# Patient Record
Sex: Female | Born: 1935 | Race: Black or African American | Hispanic: No | Smoking: Former smoker
Health system: Southern US, Community
[De-identification: ages and names within clinical notes are randomized; demographics above are authoritative.]

## PROBLEM LIST (undated history)

## (undated) DIAGNOSIS — I2510 Atherosclerotic heart disease of native coronary artery without angina pectoris: Secondary | ICD-10-CM

## (undated) DIAGNOSIS — H9190 Unspecified hearing loss, unspecified ear: Secondary | ICD-10-CM

## (undated) DIAGNOSIS — N189 Chronic kidney disease, unspecified: Secondary | ICD-10-CM

## (undated) DIAGNOSIS — M1990 Unspecified osteoarthritis, unspecified site: Secondary | ICD-10-CM

## (undated) DIAGNOSIS — D649 Anemia, unspecified: Secondary | ICD-10-CM

## (undated) DIAGNOSIS — F419 Anxiety disorder, unspecified: Secondary | ICD-10-CM

## (undated) DIAGNOSIS — C679 Malignant neoplasm of bladder, unspecified: Secondary | ICD-10-CM

## (undated) DIAGNOSIS — I770 Arteriovenous fistula, acquired: Secondary | ICD-10-CM

## (undated) DIAGNOSIS — Z8669 Personal history of other diseases of the nervous system and sense organs: Secondary | ICD-10-CM

## (undated) DIAGNOSIS — K08109 Complete loss of teeth, unspecified cause, unspecified class: Secondary | ICD-10-CM

## (undated) DIAGNOSIS — Z973 Presence of spectacles and contact lenses: Secondary | ICD-10-CM

## (undated) DIAGNOSIS — Z972 Presence of dental prosthetic device (complete) (partial): Secondary | ICD-10-CM

## (undated) DIAGNOSIS — I255 Ischemic cardiomyopathy: Secondary | ICD-10-CM

## (undated) DIAGNOSIS — I739 Peripheral vascular disease, unspecified: Secondary | ICD-10-CM

## (undated) DIAGNOSIS — E785 Hyperlipidemia, unspecified: Secondary | ICD-10-CM

## (undated) DIAGNOSIS — Z5112 Encounter for antineoplastic immunotherapy: Secondary | ICD-10-CM

## (undated) DIAGNOSIS — E119 Type 2 diabetes mellitus without complications: Secondary | ICD-10-CM

## (undated) DIAGNOSIS — J449 Chronic obstructive pulmonary disease, unspecified: Secondary | ICD-10-CM

## (undated) DIAGNOSIS — I350 Nonrheumatic aortic (valve) stenosis: Secondary | ICD-10-CM

## (undated) DIAGNOSIS — E039 Hypothyroidism, unspecified: Secondary | ICD-10-CM

## (undated) DIAGNOSIS — I10 Essential (primary) hypertension: Secondary | ICD-10-CM

## (undated) DIAGNOSIS — L659 Nonscarring hair loss, unspecified: Secondary | ICD-10-CM

## (undated) DIAGNOSIS — I219 Acute myocardial infarction, unspecified: Secondary | ICD-10-CM

## (undated) DIAGNOSIS — I639 Cerebral infarction, unspecified: Secondary | ICD-10-CM

## (undated) DIAGNOSIS — IMO0001 Reserved for inherently not codable concepts without codable children: Secondary | ICD-10-CM

## (undated) HISTORY — DX: Personal history of other diseases of the nervous system and sense organs: Z86.69

## (undated) HISTORY — PX: CYSTOSTOMY W/ BLADDER BIOPSY: SHX1431

## (undated) HISTORY — DX: Atherosclerotic heart disease of native coronary artery without angina pectoris: I25.10

## (undated) HISTORY — DX: Malignant neoplasm of bladder, unspecified: C67.9

## (undated) HISTORY — DX: Chronic kidney disease, unspecified: N18.9

## (undated) HISTORY — DX: Hypothyroidism, unspecified: E03.9

## (undated) HISTORY — DX: Peripheral vascular disease, unspecified: I73.9

## (undated) HISTORY — DX: Cerebral infarction, unspecified: I63.9

## (undated) HISTORY — PX: EYE SURGERY: SHX253

## (undated) HISTORY — DX: Type 2 diabetes mellitus without complications: E11.9

## (undated) HISTORY — DX: Chronic obstructive pulmonary disease, unspecified: J44.9

## (undated) HISTORY — DX: Nonrheumatic aortic (valve) stenosis: I35.0

## (undated) HISTORY — DX: Essential (primary) hypertension: I10

## (undated) HISTORY — PX: ANGIOPLASTY: SHX39

## (undated) HISTORY — DX: Hyperlipidemia, unspecified: E78.5

## (undated) HISTORY — DX: Encounter for antineoplastic immunotherapy: Z51.12

---

## 1997-12-04 ENCOUNTER — Ambulatory Visit: Admission: RE | Admit: 1997-12-04 | Discharge: 1997-12-04 | Payer: Self-pay | Admitting: Cardiovascular Disease

## 1998-10-27 ENCOUNTER — Emergency Department (HOSPITAL_COMMUNITY): Admission: EM | Admit: 1998-10-27 | Discharge: 1998-10-27 | Payer: Self-pay | Admitting: *Deleted

## 1998-12-17 ENCOUNTER — Encounter: Payer: Self-pay | Admitting: Internal Medicine

## 1998-12-17 ENCOUNTER — Ambulatory Visit (HOSPITAL_COMMUNITY): Admission: RE | Admit: 1998-12-17 | Discharge: 1998-12-17 | Payer: Self-pay | Admitting: Internal Medicine

## 1999-03-28 ENCOUNTER — Emergency Department (HOSPITAL_COMMUNITY): Admission: EM | Admit: 1999-03-28 | Discharge: 1999-03-28 | Payer: Self-pay | Admitting: Emergency Medicine

## 1999-03-28 ENCOUNTER — Encounter: Payer: Self-pay | Admitting: Emergency Medicine

## 1999-06-24 ENCOUNTER — Encounter: Payer: Self-pay | Admitting: Internal Medicine

## 1999-06-24 ENCOUNTER — Inpatient Hospital Stay (HOSPITAL_COMMUNITY): Admission: AD | Admit: 1999-06-24 | Discharge: 1999-07-21 | Payer: Self-pay | Admitting: Internal Medicine

## 1999-06-25 ENCOUNTER — Encounter: Payer: Self-pay | Admitting: Internal Medicine

## 1999-06-27 ENCOUNTER — Encounter: Payer: Self-pay | Admitting: Internal Medicine

## 1999-06-29 ENCOUNTER — Encounter: Payer: Self-pay | Admitting: Internal Medicine

## 1999-07-05 ENCOUNTER — Encounter: Payer: Self-pay | Admitting: Internal Medicine

## 1999-07-12 ENCOUNTER — Encounter: Payer: Self-pay | Admitting: Internal Medicine

## 2001-01-20 ENCOUNTER — Ambulatory Visit (HOSPITAL_COMMUNITY): Admission: RE | Admit: 2001-01-20 | Discharge: 2001-01-20 | Payer: Self-pay | Admitting: Nephrology

## 2001-02-10 ENCOUNTER — Ambulatory Visit (HOSPITAL_COMMUNITY): Admission: RE | Admit: 2001-02-10 | Discharge: 2001-02-10 | Payer: Self-pay | Admitting: Nephrology

## 2001-02-25 ENCOUNTER — Ambulatory Visit (HOSPITAL_COMMUNITY): Admission: RE | Admit: 2001-02-25 | Discharge: 2001-02-25 | Payer: Self-pay | Admitting: Nephrology

## 2001-03-13 ENCOUNTER — Emergency Department (HOSPITAL_COMMUNITY): Admission: EM | Admit: 2001-03-13 | Discharge: 2001-03-13 | Payer: Self-pay

## 2001-03-18 ENCOUNTER — Inpatient Hospital Stay (HOSPITAL_COMMUNITY): Admission: EM | Admit: 2001-03-18 | Discharge: 2001-03-24 | Payer: Self-pay | Admitting: *Deleted

## 2001-03-19 ENCOUNTER — Encounter: Payer: Self-pay | Admitting: Internal Medicine

## 2001-03-21 ENCOUNTER — Encounter: Payer: Self-pay | Admitting: Internal Medicine

## 2001-04-07 ENCOUNTER — Inpatient Hospital Stay (HOSPITAL_COMMUNITY): Admission: EM | Admit: 2001-04-07 | Discharge: 2001-04-10 | Payer: Self-pay | Admitting: Emergency Medicine

## 2001-04-07 ENCOUNTER — Encounter: Payer: Self-pay | Admitting: Nephrology

## 2001-04-30 ENCOUNTER — Encounter (HOSPITAL_COMMUNITY): Admission: RE | Admit: 2001-04-30 | Discharge: 2001-07-29 | Payer: Self-pay | Admitting: Nephrology

## 2001-05-12 ENCOUNTER — Encounter: Admission: RE | Admit: 2001-05-12 | Discharge: 2001-05-12 | Payer: Self-pay | Admitting: Urology

## 2001-05-12 ENCOUNTER — Encounter: Payer: Self-pay | Admitting: Urology

## 2001-05-19 ENCOUNTER — Ambulatory Visit (HOSPITAL_COMMUNITY): Admission: RE | Admit: 2001-05-19 | Discharge: 2001-05-19 | Payer: Self-pay | Admitting: Urology

## 2001-05-19 ENCOUNTER — Encounter (INDEPENDENT_AMBULATORY_CARE_PROVIDER_SITE_OTHER): Payer: Self-pay | Admitting: Specialist

## 2001-07-30 ENCOUNTER — Encounter (HOSPITAL_COMMUNITY): Admission: RE | Admit: 2001-07-30 | Discharge: 2001-10-28 | Payer: Self-pay | Admitting: Nephrology

## 2001-08-31 ENCOUNTER — Encounter (INDEPENDENT_AMBULATORY_CARE_PROVIDER_SITE_OTHER): Payer: Self-pay

## 2001-08-31 ENCOUNTER — Ambulatory Visit (HOSPITAL_BASED_OUTPATIENT_CLINIC_OR_DEPARTMENT_OTHER): Admission: RE | Admit: 2001-08-31 | Discharge: 2001-08-31 | Payer: Self-pay | Admitting: Urology

## 2001-11-09 ENCOUNTER — Encounter (HOSPITAL_COMMUNITY): Admission: RE | Admit: 2001-11-09 | Discharge: 2002-02-07 | Payer: Self-pay | Admitting: Nephrology

## 2001-11-30 ENCOUNTER — Ambulatory Visit (HOSPITAL_BASED_OUTPATIENT_CLINIC_OR_DEPARTMENT_OTHER): Admission: RE | Admit: 2001-11-30 | Discharge: 2001-11-30 | Payer: Self-pay | Admitting: Urology

## 2001-11-30 ENCOUNTER — Encounter (INDEPENDENT_AMBULATORY_CARE_PROVIDER_SITE_OTHER): Payer: Self-pay

## 2001-11-30 ENCOUNTER — Encounter (INDEPENDENT_AMBULATORY_CARE_PROVIDER_SITE_OTHER): Payer: Self-pay | Admitting: Specialist

## 2002-02-15 ENCOUNTER — Encounter (HOSPITAL_COMMUNITY): Admission: RE | Admit: 2002-02-15 | Discharge: 2002-05-16 | Payer: Self-pay | Admitting: Nephrology

## 2002-03-01 ENCOUNTER — Ambulatory Visit (HOSPITAL_BASED_OUTPATIENT_CLINIC_OR_DEPARTMENT_OTHER): Admission: RE | Admit: 2002-03-01 | Discharge: 2002-03-01 | Payer: Self-pay | Admitting: Urology

## 2002-03-01 ENCOUNTER — Encounter (INDEPENDENT_AMBULATORY_CARE_PROVIDER_SITE_OTHER): Payer: Self-pay

## 2002-05-24 ENCOUNTER — Encounter (HOSPITAL_COMMUNITY): Admission: RE | Admit: 2002-05-24 | Discharge: 2002-08-22 | Payer: Self-pay | Admitting: Nephrology

## 2002-05-26 ENCOUNTER — Encounter: Payer: Self-pay | Admitting: Urology

## 2002-05-26 ENCOUNTER — Encounter: Admission: RE | Admit: 2002-05-26 | Discharge: 2002-05-26 | Payer: Self-pay | Admitting: Urology

## 2002-05-31 ENCOUNTER — Ambulatory Visit (HOSPITAL_BASED_OUTPATIENT_CLINIC_OR_DEPARTMENT_OTHER): Admission: RE | Admit: 2002-05-31 | Discharge: 2002-05-31 | Payer: Self-pay | Admitting: Urology

## 2002-05-31 ENCOUNTER — Encounter (INDEPENDENT_AMBULATORY_CARE_PROVIDER_SITE_OTHER): Payer: Self-pay

## 2002-06-03 ENCOUNTER — Encounter: Admission: RE | Admit: 2002-06-03 | Discharge: 2002-06-03 | Payer: Self-pay | Admitting: Urology

## 2002-06-03 ENCOUNTER — Encounter: Payer: Self-pay | Admitting: Urology

## 2002-08-30 ENCOUNTER — Encounter (HOSPITAL_COMMUNITY): Admission: RE | Admit: 2002-08-30 | Discharge: 2002-11-28 | Payer: Self-pay | Admitting: Nephrology

## 2002-12-06 ENCOUNTER — Encounter (HOSPITAL_COMMUNITY): Admission: RE | Admit: 2002-12-06 | Discharge: 2003-03-06 | Payer: Self-pay | Admitting: Nephrology

## 2003-07-04 ENCOUNTER — Emergency Department (HOSPITAL_COMMUNITY): Admission: EM | Admit: 2003-07-04 | Discharge: 2003-07-04 | Payer: Self-pay | Admitting: Emergency Medicine

## 2003-12-06 ENCOUNTER — Ambulatory Visit (HOSPITAL_COMMUNITY): Admission: RE | Admit: 2003-12-06 | Discharge: 2003-12-06 | Payer: Self-pay | Admitting: Vascular Surgery

## 2004-05-31 ENCOUNTER — Ambulatory Visit: Payer: Self-pay

## 2008-07-11 ENCOUNTER — Ambulatory Visit: Payer: Self-pay | Admitting: Cardiology

## 2008-07-11 ENCOUNTER — Encounter: Payer: Self-pay | Admitting: Cardiology

## 2008-07-11 DIAGNOSIS — R0989 Other specified symptoms and signs involving the circulatory and respiratory systems: Secondary | ICD-10-CM

## 2008-07-11 DIAGNOSIS — E785 Hyperlipidemia, unspecified: Secondary | ICD-10-CM

## 2008-07-11 DIAGNOSIS — R011 Cardiac murmur, unspecified: Secondary | ICD-10-CM

## 2008-07-11 DIAGNOSIS — I498 Other specified cardiac arrhythmias: Secondary | ICD-10-CM

## 2008-07-11 DIAGNOSIS — I2510 Atherosclerotic heart disease of native coronary artery without angina pectoris: Secondary | ICD-10-CM | POA: Insufficient documentation

## 2008-07-11 DIAGNOSIS — I10 Essential (primary) hypertension: Secondary | ICD-10-CM | POA: Insufficient documentation

## 2008-07-11 DIAGNOSIS — I739 Peripheral vascular disease, unspecified: Secondary | ICD-10-CM

## 2008-07-20 ENCOUNTER — Encounter: Payer: Self-pay | Admitting: Cardiology

## 2008-07-20 ENCOUNTER — Ambulatory Visit: Payer: Self-pay | Admitting: Cardiology

## 2008-07-20 ENCOUNTER — Ambulatory Visit: Payer: Self-pay

## 2008-08-14 ENCOUNTER — Encounter: Payer: Self-pay | Admitting: Cardiology

## 2008-08-14 ENCOUNTER — Ambulatory Visit: Payer: Self-pay | Admitting: Cardiology

## 2008-08-14 DIAGNOSIS — I2589 Other forms of chronic ischemic heart disease: Secondary | ICD-10-CM | POA: Insufficient documentation

## 2008-08-23 ENCOUNTER — Telehealth (INDEPENDENT_AMBULATORY_CARE_PROVIDER_SITE_OTHER): Payer: Self-pay | Admitting: *Deleted

## 2009-02-12 ENCOUNTER — Ambulatory Visit: Payer: Self-pay | Admitting: Cardiology

## 2009-02-12 DIAGNOSIS — I359 Nonrheumatic aortic valve disorder, unspecified: Secondary | ICD-10-CM

## 2009-02-13 ENCOUNTER — Encounter: Payer: Self-pay | Admitting: Cardiology

## 2009-02-26 ENCOUNTER — Encounter: Payer: Self-pay | Admitting: Cardiology

## 2009-06-19 ENCOUNTER — Encounter: Payer: Self-pay | Admitting: Cardiology

## 2009-06-27 ENCOUNTER — Encounter: Payer: Self-pay | Admitting: Cardiology

## 2009-08-06 ENCOUNTER — Encounter: Payer: Self-pay | Admitting: Cardiology

## 2009-08-07 ENCOUNTER — Ambulatory Visit: Payer: Self-pay

## 2009-08-07 ENCOUNTER — Ambulatory Visit (HOSPITAL_COMMUNITY): Admission: RE | Admit: 2009-08-07 | Discharge: 2009-08-07 | Payer: Self-pay | Admitting: Cardiology

## 2009-08-07 ENCOUNTER — Encounter: Payer: Self-pay | Admitting: Cardiology

## 2009-08-07 ENCOUNTER — Ambulatory Visit: Payer: Self-pay | Admitting: Cardiology

## 2009-10-29 ENCOUNTER — Encounter: Payer: Self-pay | Admitting: Cardiology

## 2010-02-14 ENCOUNTER — Ambulatory Visit: Payer: Self-pay | Admitting: Cardiology

## 2010-02-20 ENCOUNTER — Encounter: Payer: Self-pay | Admitting: Cardiology

## 2010-05-10 ENCOUNTER — Emergency Department (HOSPITAL_COMMUNITY)
Admission: EM | Admit: 2010-05-10 | Discharge: 2010-05-11 | Payer: Self-pay | Source: Home / Self Care | Admitting: Emergency Medicine

## 2010-05-20 LAB — URINALYSIS, ROUTINE W REFLEX MICROSCOPIC
Bilirubin Urine: NEGATIVE
Hgb urine dipstick: NEGATIVE
Ketones, ur: NEGATIVE mg/dL
Nitrite: NEGATIVE
Protein, ur: NEGATIVE mg/dL
Specific Gravity, Urine: 1.011 (ref 1.005–1.030)
Urine Glucose, Fasting: NEGATIVE mg/dL
Urobilinogen, UA: 0.2 mg/dL (ref 0.0–1.0)
pH: 5.5 (ref 5.0–8.0)

## 2010-05-20 LAB — POCT CARDIAC MARKERS
CKMB, poc: 1.3 ng/mL (ref 1.0–8.0)
Myoglobin, poc: 109 ng/mL (ref 12–200)
Troponin i, poc: 0.07 ng/mL (ref 0.00–0.09)

## 2010-05-20 LAB — POCT I-STAT, CHEM 8
BUN: 49 mg/dL — ABNORMAL HIGH (ref 6–23)
Calcium, Ion: 1.13 mmol/L (ref 1.12–1.32)
Chloride: 112 mEq/L (ref 96–112)
Creatinine, Ser: 2.4 mg/dL — ABNORMAL HIGH (ref 0.4–1.2)
Glucose, Bld: 145 mg/dL — ABNORMAL HIGH (ref 70–99)
HCT: 36 % (ref 36.0–46.0)
Hemoglobin: 12.2 g/dL (ref 12.0–15.0)
Potassium: 4.2 mEq/L (ref 3.5–5.1)
Sodium: 142 mEq/L (ref 135–145)
TCO2: 24 mmol/L (ref 0–100)

## 2010-05-20 LAB — GLUCOSE, CAPILLARY: Glucose-Capillary: 155 mg/dL — ABNORMAL HIGH (ref 70–99)

## 2010-05-20 LAB — CBC
HCT: 35.5 % — ABNORMAL LOW (ref 36.0–46.0)
Hemoglobin: 11.5 g/dL — ABNORMAL LOW (ref 12.0–15.0)
MCH: 29.6 pg (ref 26.0–34.0)
MCHC: 32.4 g/dL (ref 30.0–36.0)
MCV: 91.5 fL (ref 78.0–100.0)
Platelets: 153 10*3/uL (ref 150–400)
RBC: 3.88 MIL/uL (ref 3.87–5.11)
RDW: 17.1 % — ABNORMAL HIGH (ref 11.5–15.5)
WBC: 6.1 10*3/uL (ref 4.0–10.5)

## 2010-05-20 LAB — DIFFERENTIAL
Basophils Absolute: 0 10*3/uL (ref 0.0–0.1)
Basophils Relative: 0 % (ref 0–1)
Eosinophils Absolute: 0.2 10*3/uL (ref 0.0–0.7)
Eosinophils Relative: 4 % (ref 0–5)
Lymphocytes Relative: 38 % (ref 12–46)
Lymphs Abs: 2.3 10*3/uL (ref 0.7–4.0)
Monocytes Absolute: 0.5 10*3/uL (ref 0.1–1.0)
Monocytes Relative: 8 % (ref 3–12)
Neutro Abs: 3.1 10*3/uL (ref 1.7–7.7)
Neutrophils Relative %: 50 % (ref 43–77)

## 2010-05-20 LAB — SEDIMENTATION RATE: Sed Rate: 18 mm/hr (ref 0–22)

## 2010-06-06 NOTE — Letter (Signed)
Summary: [facility] Patient Note   [facility] Patient Note   Imported By: Roderic Ovens 11/27/2009 16:08:15  _____________________________________________________________________  External Attachment:    Type:   Image     Comment:   External Document

## 2010-06-06 NOTE — Letter (Signed)
Summary: [facility] Office Note  [facility] Office Note   Imported By: Roderic Ovens 08/01/2009 16:27:38  _____________________________________________________________________  External Attachment:    Type:   Image     Comment:   External Document

## 2010-06-06 NOTE — Assessment & Plan Note (Signed)
Summary: f40m  Medications Added COREG 12.5 MG TABS (CARVEDILOL) once daily BISOPROLOL FUMARATE 5 MG TABS (BISOPROLOL FUMARATE) one-half  tablet daily ASPIRIN 81 MG TBEC (ASPIRIN) one daily HYDRALAZINE HCL 25 MG TABS (HYDRALAZINE HCL) take one  tablet three times a day      Allergies Added:   Visit Type:  6 month follow up Primary Dawn Kiper:  Jarome Matin  CC:  pt stated has some SOB and .  History of Present Illness: 77 yo with PAD, CKD, CAD, mild ischemic CMP presents for followup to cardiology clinic.  She has been stable since I last saw her.  She did not tolerate increasing Coreg.  She feels like taking 12.5 mg two times a day made her short of breath.  She has no problem taking Coreg 12.5 mg once daily and has been taking it this way. No significant exertional dyspnea.  She can climb a flight of steps without problems.  Her calf claudication has improved.  She is able to walk about 100 yards before her calves tighten.  She is still smoking a pack every 3 days.  No chest pain.  Carotid dopplers in 4/11 showed moderate stenosis.    ECG:  NSR with PVCs, LAFB, RBBB, old inferior MI (similar to prior ECG)  Labs (1/10): LDL 66, HDL 61, creatinine 2.32 Labs (10/10): creatinine 2.1  Current Medications (verified): 1)  Coreg 12.5 Mg Tabs (Carvedilol) .... Once Daily 2)  Furosemide 40 Mg Tabs (Furosemide) .... Take One Tablet By Mouth Daily. 3)  Hectorol 2.5 Mcg Caps (Doxercalciferol) .... Take 1 By Mouth  M W F 4)  Isosorbide Mononitrate Cr 60 Mg  Tb24 (Isosorbide Mononitrate) .... Take 1 By Mouth Once Daily 5)  Levothyroxine Sodium 25 Mcg  Tabs (Levothyroxine Sodium) .Marland Kitchen.. 1 By Mouth Daily 6)  Lipitor 80 Mg Tabs (Atorvastatin Calcium) .... Take One Tablet By Mouth At Bedtime 7)  Plavix 75 Mg Tabs (Clopidogrel Bisulfate) .... Take One Tablet By Mouth Daily 8)  Azopt 1 % Susp (Brinzolamide) .... Two Times A Day 9)  Novolog 100 Unit/ml Soln (Insulin Aspart) .... As Directed Three Times  A Day 10)  Levemir Flexpen 100 Unit/ml Soln (Insulin Detemir) .... Take As Directed At Bedtime 11)  Aspirin Ec 325 Mg Tbec (Aspirin) .... Take One Tablet By Mouth Daily 12)  Colchicine 0.6 Mg Tabs (Colchicine) .... As Needed 13)  Nitroglycerin 0.4 Mg Subl (Nitroglycerin) .... One Tablet Under Tongue Every 5 Minutes As Needed For Chest Pain---May Repeat Times Three 14)  Senokot 8.6 Mg Tabs (Sennosides) .... As Needed 15)  Folic Acid   (Folic Acid) .Marland Kitchen.. 1 Tab By Mouth Once Daily 16)  Hydralazine Hcl 25 Mg Tabs (Hydralazine Hcl) .... Take One  Tablet Three Times A Day 17)  Amlodipine Besylate 2.5 Mg Tabs (Amlodipine Besylate) .Marland Kitchen.. 1 Daily  Allergies (verified): 1)  ! Sulfa  Past History:  Past Medical History: 1.  DM2 2.  Hyperlipidemia 3.  CAD:  Pt is s/p anterior MI in 1996 with stent placed in LAD.  Last myoview in our office was in 7/05 and showed apical anterior, apical, and apical inferior infarct.  Minimal peri-infarct ischemia.  EF 49%.  4. Hypothyroidism 5. Smoking: 1ppd 6. Prior ETOH abuse 7. SMV thrombosis in 2001, coumadin x 6 mos 8. HTN 9. CVA 10. Bladder CA s/p resection and BCG treatment.  11. PAD: arterial doppler study 12/04 suggestive of > 50% bilateral SFA stenosis.  Pt has mild claudication. No invasive evaluation  given CKD and desire to avoid contrast use.  12. CKD:  Pt is stage III-IV.  She had a fistula placed but is not on dialysis.  She follows with Dr. Eliott Nine for nephrology.  13. gout 14.  Ischemic CMP:  echo (4/11) EF 40-45%, akinesis of the mid to apical inferoseptum, apical inferior wall, and true apex.  Mild diastolic dysfunction.  Mild-moderate AS with mean gradient 16 mmHg, mild aortic insufficiency, mild mitral regurgitation.  Not able to tolerate Coreg at dose above 12.5 mg two times a day or hydralazine at dose about 25 mg three times a day.  15.  Mild-moderate aortic stenosis.   16.  Carotid dopplers (3/10):  40-59% RICA.  Carotid dopplers (4/11):  40-59% RICA.   Family History: Reviewed history from 07/11/2008 and no changes required. Noncontributory.   Social History: Reviewed history from 02/12/2009 and no changes required. Married, lives in [place] with her husband.  Her daughter lives in town.  She smokes 1/3 ppd.  She used to abuse ETOH but no longer drinks.   Review of Systems       All systems reviewed and negative except as per HPI.   Vital Signs:  Patient profile:   77 year old female Height:      64 inches Weight:      150 pounds BMI:     25.84 Pulse rate:   60 / minute BP sitting:   122 / 88  (left arm) Cuff size:   regular  Vitals Entered By: Caralee Ates CMA (February 14, 2010 11:14 AM)  Physical Exam  General:  Well developed, well nourished, in no acute distress. Neck:  Neck supple, no JVD. No masses, thyromegaly or abnormal cervical nodes. Lungs:  Clear bilaterally to auscultation and percussion.  Mildly decreased breath sounds bilaterally.  Heart:  Non-displaced PMI, chest non-tender; slightly irregular rate and rhythm, S1, S2, 2/6 early SEM.  No gallop. Right carotid bruit. Trace ankle edema, no varicosities.  Unable to palpate pedal pulses but feet warm.   Abdomen:  Bowel sounds positive; abdomen soft and non-tender without masses, organomegaly, or hernias noted. No hepatosplenomegaly. Extremities:  No clubbing or cyanosis. Neurologic:  Alert and oriented x 3. Skin:  Intact without lesions or rashes.   Impression & Recommendations:  Problem # 1:  CARDIOMYOPATHY, ISCHEMIC (ICD-414.8) Euvolemic on exam, NYHA class II symptoms.  EF 40-45%.  Taking Coreg at 12.5 mg only once a day (taking twice a day made her dyspneic).  It is possible that Coreg is giving her bronchospasm if she has COPD.  Will change her beta blocker to bisoprolol 2.5 mg daily, which is very beta-1 selective. Continue current dose of hydralazine and Imdur.  No ACEI or aldosterone antagonist due to renal insufficiency.   Problem # 2:   CAROTID BRUIT (ICD-785.9) Moderate RICA stenosis.  Repeat dopplers in 4/12.   Problem # 3:  PERIPHERAL VASCULAR DISEASE (ICD-443.9) Bilateral SFA disease by arterial dopplers.  Symptoms are actually improved as she has been walking more.  I have encouraged her to continue to try to walk through the pain (to encourage collateral formation).  She needs to stop smoking.  Continue ASA, Plavix, statin.  I will avoid invasive evaluation for now given CKD (want to avoid contrast).  Holding off on cilostazol with history of CHF.    Problem # 4:  AORTIC VALVE DISORDERS (ICD-424.1) Echo in 4/13 to followup AS.   Problem # 5:  HYPERLIPIDEMIA-MIXED (ICD-272.4) Goal LDL < 70.  Will get most recent lipids from her PCP.   Problem # 6:  CAD, NATIVE VESSEL (ICD-414.01) Stable, no chest pain.  Continue ASA, plavix, Coreg, Lipitor.  Ok to decrease ASA to 81 mg daily.   Patient Instructions: 1)  Your physician has recommended you make the following change in your medication:  2)  Stop Coreg(carvedilol). 3)  Start Bisoprolol 2.5mg  daily--this will be one-half of a 5mg  tablet daily. 4)  Decrease Aspirin to 81mg  daily--this should be coated. 5)  Your physician has requested that you have a carotid duplex. This test is an ultrasound of the carotid arteries in your neck. It looks at blood flow through these arteries that supply the brain with blood. Allow one hour for this exam. There are no restrictions or special instructions. APRIL 2012 6)  Your physician wants you to follow-up in:  6 months with Dr Shirlee Latch. You will receive a reminder letter in the mail two months in advance. If you don't receive a letter, please call our office to schedule the follow-up appointment. Prescriptions: BISOPROLOL FUMARATE 5 MG TABS (BISOPROLOL FUMARATE) one-half  tablet daily  #15 x 6   Entered by:   Katina Dung, RN, BSN   Authorized by:   Marca Ancona, MD   Signed by:   Katina Dung, RN, BSN on 02/14/2010   Method used:    Electronically to        [facility] [phone]* (retail)       [address]       [place], [place]  [address]       Ph: [phone]       Fax: [phone]   RxID:   [id number]

## 2010-06-06 NOTE — Miscellaneous (Signed)
Summary: Orders Update  Clinical Lists Changes  Orders: Added new Test order of Carotid Duplex (Carotid Duplex) - Signed 

## 2010-06-06 NOTE — Letter (Signed)
Summary: [facility] Patient Note   [facility] Patient Note   Imported By: Roderic Ovens 03/21/2010 17:00:06  _____________________________________________________________________  External Attachment:    Type:   Image     Comment:   External Document

## 2010-06-06 NOTE — Letter (Signed)
Summary: [facility] Office Note  [facility] Office Note   Imported By: Roderic Ovens 09/24/2009 15:36:26  _____________________________________________________________________  External Attachment:    Type:   Image     Comment:   External Document

## 2010-06-06 NOTE — Assessment & Plan Note (Signed)
Summary: 6 month rov need echo prior to appt/sl  Medications Added COREG 12.5 MG TABS (CARVEDILOL) one tablet twice a day NITROGLYCERIN 0.4 MG SUBL (NITROGLYCERIN) One tablet under tongue every 5 minutes as needed for chest pain---may repeat times three      Allergies Added:   Primary Provider:  Jarome Matin   History of Present Illness: 77 yo with PAD, CKD, CAD, mild ischemic CMP presents for followup to cardiology clinic.  She has been doing well since I last saw her.  No chest pain.  Creatinine has been stable.  She denies DOE walking on flat ground and she is able to climb a flight of steps.  She gets fatigued and short of breath with vacuuming.  Her claudication symptoms are actually improved.  She has been trying to walk more and tries to walk through the pain.  She gets tightness in her bilateral feet and calves after walking about 50 yards, which is improved.  No syncope/presyncope.  Echo today showed EF 40-45% with mild-moderate AS.  She still has not been able to quit smoking.  She was unable to tolerate uptitration of hydralazine (she feels like it made her feel more short of breath; this resolved when she went back to her prior dose).  Weight is down 3 lbs compared to prior appointment.   ECG:  NSR with PVCs, LAFB, RBBB, old inferior MI (similar to prior ECG)  Labs (1/10): LDL 66, HDL 61, creatinine 2.32 Labs (10/10): creatinine 2.1  Current Medications (verified): 1)  Actos 15 Mg  Tabs (Pioglitazone Hcl) .Marland Kitchen.. 1 By Mouth Daily 2)  Carvedilol 6.25 Mg Tabs (Carvedilol) .... Take One Tablet By Mouth Twice A Day 3)  Furosemide 40 Mg Tabs (Furosemide) .... Take One Tablet By Mouth Daily. 4)  Hectorol 2.5 Mcg Caps (Doxercalciferol) .... Take 1 By Mouth  M W F 5)  Isosorbide Mononitrate Cr 60 Mg  Tb24 (Isosorbide Mononitrate) .... Take 1 By Mouth Once Daily 6)  Levothyroxine Sodium 25 Mcg  Tabs (Levothyroxine Sodium) .Marland Kitchen.. 1 By Mouth Daily 7)  Lipitor 80 Mg Tabs (Atorvastatin  Calcium) .... Take One Tablet By Mouth At Bedtime 8)  Plavix 75 Mg Tabs (Clopidogrel Bisulfate) .... Take One Tablet By Mouth Daily 9)  Azopt 1 % Susp (Brinzolamide) .... Two Times A Day 10)  Novolog 100 Unit/ml Soln (Insulin Aspart) .... As Directed Three Times A Day 11)  Levemir Flexpen 100 Unit/ml Soln (Insulin Detemir) .... Take As Directed At Bedtime 12)  Aspirin Ec 325 Mg Tbec (Aspirin) .... Take One Tablet By Mouth Daily 13)  Colchicine 0.6 Mg Tabs (Colchicine) .... As Needed 14)  Nitroglycerin 0.4 Mg Subl (Nitroglycerin) .... One Tablet Under Tongue Every 5 Minutes As Needed For Chest Pain---May Repeat Times Three 15)  Senokot 8.6 Mg Tabs (Sennosides) .... As Needed 16)  Folic Acid   (Folic Acid) .Marland Kitchen.. 1 Tab By Mouth Once Daily 17)  Hydralazine Hcl 25 Mg Tabs (Hydralazine Hcl) .... Take One  Tablet Three Times A Day 18)  Amlodipine Besylate 2.5 Mg Tabs (Amlodipine Besylate) .Marland Kitchen.. 1 Daily  Allergies (verified): 1)  ! Sulfa  Past History:  Past Medical History: 1.  DM2 2.  Hyperlipidemia 3.  CAD:  Pt is s/p anterior MI in 1996 with stent placed in LAD.  Last myoview in our office was in 7/05 and showed apical anterior, apical, and apical inferior infarct.  Minimal peri-infarct ischemia.  EF 49%.  4. Hypothyroidism 5. Smoking: 1ppd 6. Prior ETOH  abuse 7. SMV thrombosis in 2001, coumadin x 6 mos 8. HTN 9. CVA 10. Bladder CA s/p resection and BCG treatment.  11. PAD: arterial doppler study 12/04 suggestive of > 50% bilateral SFA stenosis.  Pt has mild claudication. No invasive evaluation given CKD and desire to avoid contrast use.  12. CKD:  Pt is stage III-IV.  She had a fistula placed but is not on dialysis.  She follows with Dr. Eliott Nine for nephrology.  13. gout 14.  Ischemic CMP:  echo (4/11) EF 40-45%, akinesis of the mid to apical inferoseptum, apical inferior wall, and true apex.  Mild diastolic dysfunction.  Mild-moderate AS with mean gradient 16 mmHg, mild aortic  insufficiency, mild mitral regurgitation.  15.  Mild-moderate aortic stenosis.   16.  Carotid dopplers (3/10):  40-59% RICA  Family History: Reviewed history from 07/11/2008 and no changes required. Noncontributory.   Social History: Reviewed history from 02/12/2009 and no changes required. Married, lives in [place] with her husband.  Recently moved back here from [place].  Her daughter lives in town.  She smokes 1/2 ppd.  She used to abuse ETOH but no longer drinks.   Vital Signs:  Patient profile:   77 year old female Height:      64 inches Weight:      148 pounds BMI:     25.50 Pulse rate:   60 / minute Resp:     16 per minute BP sitting:   132 / 84  (left arm)  Vitals Entered By: Marrion Coy, CNA (August 07, 2009 10:51 AM)  Physical Exam  General:  Well developed, well nourished, in no acute distress. Neck:  Neck supple, no JVD. No masses, thyromegaly or abnormal cervical nodes. Lungs:  Clear bilaterally to auscultation and percussion.  Mildly decreased breath sounds bilaterally.  Heart:  Non-displaced PMI, chest non-tender; slightly irregular rate and rhythm, S1, S2, 2/6 early SEM.  No gallop. Right carotid bruit. Trace ankle edema, no varicosities.  Unable to palpate pedal pulses but feet warm.   Abdomen:  Bowel sounds positive; abdomen soft and non-tender without masses, organomegaly, or hernias noted. No hepatosplenomegaly. Extremities:  No clubbing or cyanosis. Neurologic:  Alert and oriented x 3. Psych:  Normal affect.   Impression & Recommendations:  Problem # 1:  CARDIOMYOPATHY, ISCHEMIC (ICD-414.8) Mild ischemic CMP with EF 40-45%, stable.  Weight is down 3 lbs compared to prior appointment.  She is on carvedilol.  Holding off on ACEI for now with last creatinine 2.1 but may be able to tolerate low dose in future.  She is on hydralazine/Imdur.  She was unable to tolerate increasing hydralazine from 25 mg three times a day to 37.5 mg three times a day.  She is  euvolemic on exam with stable NYHA II symptoms.  - Increase Coreg to 9.375 mg two times a day x 1 week, then increase to 12.5 mg two times a day if she tolerates 9.375 mg two times a day.    Problem # 2:  AORTIC VALVE DISORDERS (ICD-424.1) Mild to moderate AS.  Fits exam.  Would repeat echo in about 2 years to reassess gradient.   Problem # 3:  PERIPHERAL VASCULAR DISEASE (ICD-443.9) Significant claudication with bilateral SFA disease by arterial dopplers.  Symptoms are actually improved as she has been walking more.  I have encouraged her to continue to try to walk through the pain (to encourage collateral formation).  She needs to stop smoking.  Continue ASA, Plavix, statin.  I will avoid invasive evaluation for now given CKD (want to avoid contrast).  Holding off on cilostazol with history of CHF.  Will get peripheral arterial dopplers prior to next appointment in 6 months.   Problem # 4:  CAROTID BRUIT (ICD-785.9) Repeating carotid dopplers today.   Problem # 5:  HYPERLIPIDEMIA-MIXED (ICD-272.4) Had lipids in 1/11 with Dr. Eloise Harman.  Will ask for his office to send the numbers.  Goal LDL < 70.   Problem # 6:  SMOKING I again encouraged her to quit.  She does not want to try a pharmacological aide.   Other Orders: Arterial Duplex Lower Extremity (Arterial Duplex Low)  Patient Instructions: 1)  Your physician has recommended you make the following change in your medication:  2)   Increase Coreg(carvedilol) 6.25mg  to one and one-half tablets twice a day for 1 week then increase to 12.5mg  (two 6.25mg  tablets) twice a day 3)  Your physician wants you to follow-up in: 6 months with Dr Shirlee Latch.  You will receive a reminder letter in the mail two months in advance. If you don't receive a letter, please call our office to schedule the follow-up appointment. 4)  Your physician has requested that you have a lower or upper extremity arterial duplex.  This test is an ultrasound of the arteries in the  legs or arms.  It looks at arterial blood flow in the legs and arms.  Allow one hour for Lower and Upper Arterial scans. There are no restrictions or special instructions. A FEW DAYS BEFORE THE SIX MONTH APPT WITH DR [facility]  Prescriptions: COREG 12.5 MG TABS (CARVEDILOL) one tablet twice a day  #60 x 6   Entered by:   Katina Dung, RN, BSN   Authorized by:   Marca Ancona, MD   Signed by:   Katina Dung, RN, BSN on 08/07/2009   Method used:   Electronically to        [facility] [phone]* (retail)       [address]       [place], [place]  [address]       Ph: [phone]       Fax: [phone]   RxID:   [id number] NITROGLYCERIN 0.4 MG SUBL (NITROGLYCERIN) One tablet under tongue every 5 minutes as needed for chest pain---may repeat times three  #25 x 5   Entered by:   Marrion Coy, CNA   Authorized by:   Marca Ancona, MD   Signed by:   Marrion Coy, CNA on 08/07/2009   Method used:   Electronically to        [facility] [phone]* (retail)       [address]       [place], [place]  [address]       Ph: [phone]       Fax: [phone]   RxID:   [id number]

## 2010-06-06 NOTE — Letter (Signed)
Summary: [facility] - Lipid/CMP  [facility] - Lipid/CMP   Imported By: Roderic Ovens 09/24/2009 15:35:53  _____________________________________________________________________  External Attachment:    Type:   Image     Comment:   External Document

## 2010-06-21 ENCOUNTER — Encounter: Payer: Self-pay | Admitting: Cardiology

## 2010-08-21 ENCOUNTER — Other Ambulatory Visit: Payer: Self-pay | Admitting: Cardiology

## 2010-08-28 ENCOUNTER — Encounter: Payer: Self-pay | Admitting: Cardiology

## 2010-08-28 ENCOUNTER — Other Ambulatory Visit: Payer: Self-pay | Admitting: Cardiology

## 2010-08-28 DIAGNOSIS — I6529 Occlusion and stenosis of unspecified carotid artery: Secondary | ICD-10-CM

## 2010-08-29 ENCOUNTER — Ambulatory Visit (INDEPENDENT_AMBULATORY_CARE_PROVIDER_SITE_OTHER): Payer: Medicare Other | Admitting: Cardiology

## 2010-08-29 ENCOUNTER — Encounter (INDEPENDENT_AMBULATORY_CARE_PROVIDER_SITE_OTHER): Payer: Medicare Other | Admitting: *Deleted

## 2010-08-29 ENCOUNTER — Encounter: Payer: Self-pay | Admitting: Cardiology

## 2010-08-29 DIAGNOSIS — IMO0001 Reserved for inherently not codable concepts without codable children: Secondary | ICD-10-CM

## 2010-08-29 DIAGNOSIS — E785 Hyperlipidemia, unspecified: Secondary | ICD-10-CM

## 2010-08-29 DIAGNOSIS — I739 Peripheral vascular disease, unspecified: Secondary | ICD-10-CM

## 2010-08-29 DIAGNOSIS — I5022 Chronic systolic (congestive) heart failure: Secondary | ICD-10-CM

## 2010-08-29 DIAGNOSIS — F17200 Nicotine dependence, unspecified, uncomplicated: Secondary | ICD-10-CM

## 2010-08-29 DIAGNOSIS — I6529 Occlusion and stenosis of unspecified carotid artery: Secondary | ICD-10-CM

## 2010-08-29 DIAGNOSIS — I2589 Other forms of chronic ischemic heart disease: Secondary | ICD-10-CM

## 2010-08-29 DIAGNOSIS — I2510 Atherosclerotic heart disease of native coronary artery without angina pectoris: Secondary | ICD-10-CM

## 2010-08-29 DIAGNOSIS — R0989 Other specified symptoms and signs involving the circulatory and respiratory systems: Secondary | ICD-10-CM

## 2010-08-29 DIAGNOSIS — I359 Nonrheumatic aortic valve disorder, unspecified: Secondary | ICD-10-CM

## 2010-08-29 MED ORDER — HYDRALAZINE HCL 25 MG PO TABS: ORAL_TABLET | ORAL | Status: DC

## 2010-08-29 NOTE — Patient Instructions (Signed)
Increase Hydralazine to 37.5mg  three times a day. This will be one and one-half 25mg  tablets three times a day.  Your physician wants you to follow-up in: 6 months with Dr Shirlee Latch.(October 2012). You will receive a reminder letter in the mail two months in advance. If you don't receive a letter, please call our office to schedule the follow-up appointment.

## 2010-08-30 ENCOUNTER — Other Ambulatory Visit: Payer: Self-pay | Admitting: Cardiology

## 2010-08-30 DIAGNOSIS — F17200 Nicotine dependence, unspecified, uncomplicated: Secondary | ICD-10-CM | POA: Insufficient documentation

## 2010-08-30 NOTE — Assessment & Plan Note (Signed)
Lipids at goal (LDL < 70) in 10/11. She will get repeat lipids soon in Dr. Silvano Rusk office.  I will try to get a copy.

## 2010-08-30 NOTE — Assessment & Plan Note (Signed)
Euvolemic on exam, NYHA class II symptoms.  EF 40-45%.  She is tolerating bisoprolol better than Coreg.  I will not increase bisoprolol given resting bradycardia (HR around 50).  No ACEI or aldosterone antagonist due to renal insufficiency.  I will have her continue Imdur but increase hydralazine to 37.5 mg tid. She also needs to get more exercise.  I suggested to try to walk for about 30 minutes on most days.

## 2010-08-30 NOTE — Assessment & Plan Note (Signed)
Mild to moderate AS.  Echo in 2/13 to re-evaluation.

## 2010-08-30 NOTE — Assessment & Plan Note (Signed)
Bilateral SFA disease by arterial dopplers.  Symptoms are actually improved as she has been walking more.  I have encouraged her to continue to try to walk through the pain (to encourage collateral formation).  She needs to stop smoking.  Continue ASA, Plavix, statin.  I will avoid invasive evaluation for now given CKD (want to avoid contrast).  Holding off on cilostazol with history of CHF.

## 2010-08-30 NOTE — Progress Notes (Signed)
Primary Provider:  Jarome Matin  History of Present Illness: 77 yo with PAD, CKD, CAD, mild ischemic CMP presents for followup to cardiology clinic.  She has been stable since I last saw her.  She seems to be breathing better now that I took her off carvedilol and started bisoprolol instead.  Very mild dyspnea after walking 100 yards.  She can climb a flight of steps without problems.  Her calf claudication has improved.  She is able to walk about 100 yards before her calves tighten.  She is still smoking 2-3 cigs/day.  No chest pain.  Patient has gained about 10 lbs since last appointment but blames this on poor diet rather than fluid retention.    ECG:  NSR, LAFB, RBBB, rate 53  Labs (1/10): LDL 66, HDL 61, creatinine 2.32 Labs (10/10): creatinine 2.1 Labs (10/11): K 3.8, creatinine 2.13, K 3.8 Labs (1/12): creatinine 2.4, HCT 35.5  Allergies (verified):  1)  ! Sulfa  Past Medical History: 1.  DM2 2.  Hyperlipidemia 3.  CAD:  Pt is s/p anterior MI in 1996 with stent placed in LAD.  Last myoview in our office was in 7/05 and showed apical anterior, apical, and apical inferior infarct.  Minimal peri-infarct ischemia.  EF 49%.  4. Hypothyroidism 5. Smoking: < 1ppd 6. Prior ETOH abuse 7. SMV thrombosis in 2001, coumadin x 6 mos 8. HTN 9. CVA 10. Bladder CA s/p resection and BCG treatment.  11. PAD: arterial doppler study 12/04 suggestive of > 50% bilateral SFA stenosis.  Pt has mild claudication. No invasive evaluation given CKD and desire to avoid contrast use.  12. CKD:  Pt is stage III-IV.  She had a fistula placed but is not on dialysis.  She follows with Dr. Eliott Nine for nephrology.  13. gout 14.  Ischemic CMP:  echo (4/11) EF 40-45%, akinesis of the mid to apical inferoseptum, apical inferior wall, and true apex.  Mild diastolic dysfunction.  Mild-moderate AS with mean gradient 16 mmHg, mild aortic insufficiency, mild mitral regurgitation.  Not able to tolerate Coreg 12.5 mg two  times a day.  15.  Mild-moderate aortic stenosis.   16.  Carotid dopplers (3/10):  40-59% RICA.  Carotid dopplers (4/11): 40-59% RICA.   Family History: Noncontributory.   Social History: Married, lives in [place] with her husband.  Her daughter lives in town.  She smokes 2-3 cigs.  She used to abuse ETOH but no longer drinks.  ROS: All systems reviewed and negative except as per HPI.    Current Outpatient Prescriptions  Medication Sig Dispense Refill  . amLODipine (NORVASC) 2.5 MG tablet take 1 tablet by mouth once daily  30 tablet  5  . aspirin 81 MG tablet Take 81 mg by mouth daily.        Marland Kitchen atorvastatin (LIPITOR) 80 MG tablet Take 80 mg by mouth daily.        . bisoprolol (ZEBETA) 5 MG tablet Take on half tablet daily by mouth       . brinzolamide (AZOPT) 1 % ophthalmic suspension Place 1 drop into both eyes 3 (three) times daily.       . Cholecalciferol (VITAMIN D3) 50000 UNITS CAPS Take 1 capsule by mouth 2 (two) times a week.        . clopidogrel (PLAVIX) 75 MG tablet Take 75 mg by mouth daily.        Marland Kitchen FOLIC ACID PO 1 tab po qd       .  furosemide (LASIX) 40 MG tablet Take 40 mg by mouth daily.        . insulin aspart (NOVOLOG) 100 UNIT/ML injection Inject into the skin 3 (three) times daily before meals. 6 units breakfast, 5 units lunch, 5 units suppper       . insulin detemir (LEVEMIR) 100 UNIT/ML injection Inject 6 Units into the skin at bedtime.        . isosorbide mononitrate (IMDUR) 60 MG 24 hr tablet Take 60 mg by mouth daily.        Marland Kitchen levothyroxine (SYNTHROID, LEVOTHROID) 50 MCG tablet Take 50 mcg by mouth daily.        . nitroGLYCERIN (NITROSTAT) 0.4 MG SL tablet Place 0.4 mg under the tongue every 5 (five) minutes as needed. Up to 3 doses       . Polyethylene Glycol 3350 POWD Take one cap full (17g) dissolved into 8 ounces of fluid by mouth once daily       . hydrALAZINE (APRESOLINE) 25 MG tablet Take 1 and 1/2 tablets three times a day.  135 tablet  11   BP 110/72   Pulse 53  Ht 5\' 4"  (1.626 m)  Wt 161 lb 1.9 oz (73.084 kg)  BMI 27.66 kg/m2 General:  Well developed, well nourished, in no acute distress. Neck:  Neck supple, no JVD. No masses, thyromegaly or abnormal cervical nodes. Lungs:  Clear bilaterally to auscultation and percussion.  Mildly decreased breath sounds bilaterally.  Heart:  Non-displaced PMI, chest non-tender; slightly irregular rate and rhythm, S1, S2, 2/6 early SEM.  No gallop. Right carotid bruit. Trace ankle edema, no varicosities.  Unable to palpate pedal pulses but feet warm.   Abdomen:  Bowel sounds positive; abdomen soft and non-tender without masses, organomegaly, or hernias noted. No hepatosplenomegaly. Extremities:  No clubbing or cyanosis. Neurologic:  Alert and oriented x 3.

## 2010-08-30 NOTE — Assessment & Plan Note (Signed)
Patient will get repeat carotids today.

## 2010-08-30 NOTE — Assessment & Plan Note (Signed)
Stable, no chest pain.  Continue ASA, plavix,

## 2010-09-02 ENCOUNTER — Encounter: Payer: Self-pay | Admitting: Cardiology

## 2010-09-06 NOTE — Telephone Encounter (Signed)
Pt is returning call.  

## 2010-09-20 NOTE — Consult Note (Signed)
[place]  Patient:    Erica Jimenez, Erica Jimenez Visit Number: [id number] MRN: [id number]          Service Type: MED Location: [phone] Attending Physician:  Virgia Land Dictated by:   Delano Metz, M.D. Proc. Date: 03/19/01 Admit Date:  03/18/2001   CC:         Lindell Spar. Chestine Spore, M.D.   Consultation Report  REASON FOR CONSULTATION:  Elevated creatinine.  HISTORY OF PRESENT ILLNESS:  The patient is a 77 year old African-American female with a diabetes, hypertension, claudication, coronary artery disease, and history of chronic renal failure admitted earlier today with a several-day history of fever, chills, myalgias, and generalized weakness. She presented to the emergency room, I believe, on November 9 with these symptoms and was treated empirically for UTI with Septra DS. Urine culture from that presentation has shown no growth. She is admitted today with lack of response and persistent symptoms with mainly generalized malaise, chills, and fever.  The baseline creatinine according to Dr. Lindell Spar. Clarks admission note is apparently between 2.5 and 3. Reviewing the old chart in February of 2001, the creatinine was 1.1 at the beginning of an admission for Klebsiella sepsis of uncertain origin. She received gentamicin and developed acute renal failure with a discharge creatinine of 2.4. It was dropping at that time.  Currently, she denies any dysuria. She does describe recently that her urine has been cloudy. She does not remember having any suprapubic or low back pain or flank pain. She has vomited several times over the past few days. Denies shortness of breath or chest pain.  PAST MEDICAL HISTORY:  1. Chronic renal failure as above.  2. History of acute renal failure, February 2001.  3. CAD with history of anterior wall MI and a stent in 1996.  4. Continued tobacco use, one pack a day.  5. Prior alcohol use 25 years, quit 10 years  ago.  6. History of claudication.  7. History of superior mesenteric vein thrombosis, February 2001. Treated     with Coumadin for six months.  8. History of renal and splenic scars by CAT scan, February 2001. Infection     versus infarction.  9. Hypertension of 10 to 20 years duration. 10. Diabetes mellitus of eight years duration. 11. Glaucoma. 12. Hyperlipidemia.  CURRENT MEDICATIONS:  Imdur; Pravachol; aspirin; Actos; diltiazem; Zaroxolyn 2.5 Monday, Wednesday, Friday; Trental; 70/30 insulin; IV Cipro; and recently taking p.o. Septra prior to admission.  SOCIAL HISTORY:  Married, lives with her husband. Continued tobacco but no alcohol use. She has grown children out of state.  FAMILY HISTORY:  Father died in his 61s of TB. Mother in her 9s of breast cancer. No kidney disease in the family.  REVIEW OF SYSTEMS:  Denies weight loss or night sweats. ENT:  Denies hearing loss, visual changes, sore throat, or difficulty swallowing. RESPIRATORY: Denies hemoptysis, purulent sputum production, history of pneumonia. CARDIAC: As above. Denies any substernal chest pain, orthopnea, PND recently or peripheral edema. GI:  As above. GU:  As above. MUSCULOSKELETAL:  No history of arthritis, over-the-counter NSAID use. NEUROLOGICAL:  Denies any focal weakness or numbness.  PHYSICAL EXAMINATION:  VITAL SIGNS:  Temperature is 102.2, blood pressure is 150/70, pulse 80, respirations 16.  GENERAL:  The patient is a mildly toxic, older female in no distress.  HEENT:  PERRLA. EOMI. Throat is moist and clear.  NECK:  Supple without meningismus, and the neck veins appear to be flat.  CHEST:  Bilateral breath sounds are clear throughout with good air movement.  CARDIAC:  Regular rate and rhythm with slightly distant heart sounds. No rub, murmur, or gallop.  ABDOMEN:  Soft. There are bilateral abdominal bruits which radiate to both flanks. There are no masses.  EXTREMITIES:  Decreased  femoral pulses. The foot showed no gangrene, cyanosis, or livedo reticularis. There is no peripheral edema.  NEUROLOGICAL:  Nonfocal, grossly with no asterixis.  LABORATORY DATA:  Sodium 133, potassium 4.5, BUN 58, creatinine 4.7, CO2 23, albumin 3.7, and calcium 9.1. White blood count 3.8, hemoglobin 10, platelets 151. LFTs normal. Urinalysis:  On November 9, 300 protein, red cells, white cells, and bacteria. Urinalysis today November 15, 30 protein and positive red cells, white blood cells, and bacteria.  Chest x-ray:  No CHF or infiltrate.  IMPRESSION:  Acute on chronic renal failure in the setting of high fevers and possible urosepsis. There is a history of renal scars noted on prior CAT scan. History of diabetes with retinopathy on exam, as well as abdominal bruits and heavy tobacco use. Main possibilities for underlying renal disease include diabetes, renal vascular disease, and/or chronic interstitial nephritis due to recurrent pyelonephritis. More acutely, concerns would be acute renal failure due to bilateral pyelonephritis, acute glomerulonephritis, and interstitial nephritis related to Septra and/or sepsis. There is no history of recent NSAID, ACE inhibitor, or ARB ingestion.  RECOMMENDATIONS:  1. Renal ultrasound.  2. MRA to look at renal arteries.  3. Culture and treat for possible UTI as you are doing.  4. Check serologies for glomerulonephritis and myeloma.  5. Stop diuretics and administer moderate IV fluids. Place Foley catheter and     strict Is & Os, daily weights. She does appear euvolemic at this time.  6. If problems persist, may need to consider renal biopsy in the near future.  Thank you for the referral. Dictated by:   Delano Metz, M.D. Attending Physician:  Virgia Land DD:  03/19/01 TD:  03/19/01 Job: [id number]

## 2010-09-20 NOTE — H&P (Signed)
[place]. [facility]  Patient:    Erica Jimenez, Erica Jimenez Visit Number: [id number] MRN: [id number]          Service Type: MED Location: [phone] Attending Physician:  Lurlean Nanny Dictated by:   Llana Aliment. Deterding, M.D. Admit Date:  04/07/2001   CC:         Lindell Spar. Chestine Spore, M.D.   History and Physical  ADMITTING DIAGNOSIS:  Probable urinary tract infection with sepsis versus acute drug reaction.  HISTORY OF PRESENT ILLNESS:  This is a 77 year old female with history of chronic renal insufficiency secondary to diabetes mellitus, vascular disease that was found on last admission on an MRI, history of nephrotoxic injury, chronic creatinine in the mid-2s; she had acute renal failure related to gentamicin in February of this last year.  We met her in mid-November when she was admitted for acute renal failure with creatinine up to 4.7.  She had failed outpatient treatment with Septra and had a urinary tract infection with negative blood cultures at that time but was felt to be uroseptic.  Creatinine peaked at 4.6 and went down to 2.3 at discharge.  She was treated with Cipro and Rocephin and next she was on Cipro at discharge up until April 03, 2001; she was discharged on March 29, 2001.  She claims she has had dysuria since she discontinued Cipro.  She has had very little appetite and very little energy since she was discharged.  She was seen in the office today by Dr. Allayne Gitelman and given Septra and Pyridium because she had too numerous to count white cells in her urine.  Thirty minutes after taking those at home, she had nausea, vomiting and started feeling bad.  She has been vomiting x 4 and has had diarrhea x 2 that is resolving at the current time, but she feels miserable, having ongoing chills and aches.  She said she had a little bit of that last night but really not like to this degree.  She has had no fevers or chills on a regular basis.   She knew she had very poor appetite. Glucose has been less than 200.  She has nocturia x 3.  She has had no flank pain, rash or blood in her urine.  ALLERGIES:  She denies knowing any drug allergies.  MEDICATIONS AT HOME:  1. Trental 400 mg a day.  2. Imdur 60 mg once a day.  3. Humulin 75/25, 20 units in the morning, 5 units in the p.m.  4. Calcitrol 0.25 mcg a day.  5. Pravachol 20 mg a day.  6. Actos 15 mg a day.  7. She had been on Cipro as mentioned above.  8. She is also on some eye drops that her husband just brought.  9. She is also on some eye drops that her husband just brought, that is     Travatan of which she uses 0.004% one drop each eye at bedtime and Azopt     eye drops 1%, one drop to her left eye b.i.d. 10. She also is on Imdur 60 mg a day.  DIET:  She has been on a 3 g sodium, 1600-calorie ADA diet.  PAST MEDICAL HISTORY:  Her past medical history includes the medications as listed above.  She also has a history of acute renal failure twice now, history of coronary artery disease and anterior wall infarct in 1996 and a stent, history of tobacco abuse of 1 pack per day, still  ongoing, and alcohol use for 25 years but quit 10 years ago, history of claudication, history of a superior mesenteric venous thrombosis in February of 2001, treated with 6 months of Coumadin, history of renal and splenic scars on the CT scan of February 2001 and which were determined to be infection versus infarction, history of hypertension of 10 to 20 years duration, history of diabetes mellitus of 8 years duration, glaucoma and hyperlipidemia.  SOCIAL HISTORY:  She is married and lives with her husband.  She has tobacco use.  Quit alcohol.  She has several grown children who live out of state.  FAMILY HISTORY:  Her father died in his 34s of TB, mother in her 44s with breast cancer.  No history of kidney disease in the family.  REVIEW OF SYSTEMS:  As mentioned above, she has very poor  appetite, little get up and go.  HEENT:  She occasionally has a headache on the right but does not believe she has had one since she started vomiting today.  She says her vision is pretty good.  She does have a glaucoma.  She has had no sore throat, sores in her mouth or hearing difficulties.  PULMONARY:  She has some chronic cough but no sputum production at the current time.  She sleeps on one pillow. CARDIOVASCULAR:  She denies ankle edema.  Denies dyspnea on exertion.  She has had no chest pain recently.  She has had no orthopnea or PND.  She has the nocturia as mentioned above.  GI:  As mentioned above.  No bloody or black stools, also no history of hepatitis or yellow jaundice.  GU:  As above. MUSCULOSKELETAL:  She has no arthritis or headaches.  She denies nonsteroidal or over-the-counter BC or Goodys use.  NEUROLOGIC:  No complaints at this time.  PHYSICAL EXAMINATION:  VITAL SIGNS:  Temperature 100.4.  Blood pressure 119/48.  Heart rate 96.  GENERAL:  She is curled up, somewhat withdrawn.  She is chilling.  HEENT:  Mild diabetic retinopathy.  Ears and pharynx unremarkable.  NECK:  Shotty posterior cervical adenopathy in the neck.  Carotids unremarkable.  No thyromegaly.  CARDIOVASCULAR:  Regular rhythm.  S4.  No murmur noted.  PMI is 11 cm lateral to the midsternal line at the 5th intercostal space.  Decreased dorsalis pedis pulses.  Bilateral femoral bruits.  LUNGS:  No rales, rhonchi or wheezes.  Breath sounds are decreased.  ABDOMEN:  Positive bowel sounds but decreased.  Abdomen is soft.  No organomegaly.  SKIN:  No active lesions.  NODES:  She has no significant adenopathy in axillae or supraclavicular areas.  NEUROLOGIC:  Deep tendon reflexes are 2+/4+ in the upper extremities, 1+/4+ in the lower extremities.  Toes are downgoing.  Motor is 5/5 and symmetric.  She is oriented x 3 but withdrawn.  RECTAL:  Stool is guaiac negative.  BREASTS:   Unremarkable.   LABORATORY DATA:  Urinalysis showed in the office too numerous to count white cells but labs are pending otherwise.  ASSESSMENT:  1. Probable urinary tract infection with sepsis versus a drug-induced     reaction causing the chills and nausea and vomiting.  There is a question     of Septra reaction prior to her last admission.  She cannot hold down her     p.o. medications or volume at this time so she needs intravenous medicines     and intravenous fluids.  History of acute renal failure x 2 puts her  at     high risk for another episode of acute renal failure.  Need to rule out a     drug-induced injury.  Also need to rule out sepsis.  The organism ______     was Staphylococcus and was an methicillin-resistant Staphylococcus aureus.     Septra was chosen because that was one of the only p.o. medicines it was     sensitive to.  2. Nausea and vomiting secondary to medications versus sepsis.  3. Chronic renal insufficiency.  We will check her creatinine at this time to     make sure she does not have acute renal failure.  4. History of recurrent urinary tract infections -- needs invasive workup at     this time because she is having recurrent problems with this.  5. Diabetes mellitus.  Will need sliding-scale insulin and as she has had     poor intake and very close monitoring of that.  She is at high risk     because of a stress syndrome with a fever for a volatile glucose.  6. Chronic obstructive pulmonary disease with ongoing tobacco use.  7. Coronary artery disease.  She is high risk for exacerbation with this     stress.  We will keep her on her nitrates.  8. Peripheral vascular disease.  9. History of superior mesenteric venous thrombosis. 10. History of glaucoma.  Make sure that she gets her medicine. 11. Hyperlipidemia.  PLAN:  1. IV fluids.  2. Antiemetics.  3. IV antibiotics.  4. Monitor her diabetes.  5. Sliding-scale insulin.  6. P.o. fluids.  7.  Work up recurrent UTIs. Dictated by:   Llana Aliment. Deterding, M.D. Attending Physician:  Lurlean Nanny DD:  04/07/01 TD:  04/08/01 Job: [id number]

## 2010-09-20 NOTE — Discharge Summary (Signed)
. [facility]  Patient:    Erica Jimenez, Erica Jimenez Visit Number: [id number] MRN: [id number]          Service Type: REC Location: [facility] Attending Physician:  Dayle Points Dictated by:   Amada Jupiter, P.A. Admit Date:  04/30/2001 Disc. Date: 04/10/01                             Discharge Summary  ADMISSION DIAGNOSES: 1. Probable urinary tract infection with sepsis versus drug induced reaction. 2. Nausea and vomiting, chills, rule out sepsis. 3. Chronic renal insufficiency. 4. History of recurrent urinary tract infections. 5. Diabetes mellitus. 6. Chronic obstructive pulmonary disease. 7. Coronary artery disease. 8. Peripheral vascular disease. 9. Hyperlipidemia.  DISCHARGE DIAGNOSES: 1. Probable Urosepsis although all cultures negative.  Patient responded    favorably to antibiotics. 2. Nausea, vomiting and chills, resolved. 3. Chronic renal insufficiency, improved at time of discharge. 4. History of recurring urinary tract infections. 5. Diabetes mellitus. 6. Chronic obstructive pulmonary disease. 7. Coronary artery disease 8. Peripheral vascular disease. 9. Hyperlipidemia.  BRIEF HISTORY:  The patient is a 77 year old female with chronic renal insufficiency secondary to diabetic nephropathy, chronic creatinine is in the mid 2s.  Patient has a history of nephrotoxic injury including acute renal failure related to gentamicin 06/2000.  She has been recently treated for urinary tract infections with Cipro after failing a Septra course.  Since completing the Cipro approximately 04/03/01 she claims she has continued to have dysuria.  She has very little appetite or energy.  She saw Dr. Allayne Gitelman on the day of admission and was given Septra and Pyridium because of the too numerous to count white blood cells in her urine.  After returning home she began experiencing nausea, vomiting, malaise and chills.  She had diarrhea times two.  She claims she  has nocturia times three.  Her sugars have been controlled with blood sugar levels less than 200 and denies flank pain, rash or blood in her urine.  LABORATORY DATA ON ADMISSION:  White blood cell count 8600, hemoglobin 10.3, hematocrit 29.8, platelet count 169K, 84 segs, 15 lymphocytes, 1 monocyte, 0 eosinophils with greater than 20% bands.  ELECTROLYTES:  Sodium 137, potassium 3.8, chloride 106, cO2 23, glucose 80, BUN 39, creatinine 2.9, calcium 8.8, albumin 3.2.  LIVER FUNCTION TESTS:  Within normal limits.  Lipase 21.  Phosphorus 2.3, cortisol 12.3.  URINALYSIS:  Red, turbid, large amount of hemoglobin and bilirubin.  Positive ketones, protein and urobilinogen.  Positive nitrite, leukocyte esterase, too numerous to count white blood cells.  CHEST X-RAY:  A chest x-ray was done which was normal.  HOSPITAL COURSE: #1 -  PROBABLE UROSEPSIS, CULTURES NEGATIVE:  The patient was pan-cultured and put on vancomycin and Cipro in addition to her usual medications.  A CT scan of the abdomen was done showing no acute abnormalities.  She had an indwelling Foley catheter placed to measure accurate intake and output levels.  She was hypotensive after several days of antibiotic therapy so therefore an echocardiogram was done.  Ejection fraction was reported at 55% with mild aortic valve thickness but severe hypokinesis of the distal septum, distal anterior wall and distal lateral wall.  Hemodynamics were suggestive of mild aortic stenosis.  She responded to intravenous fluids and gradually her blood pressure came up.  She was transfused one unit of packed red blood cells.  She dramatically improved by the last hospital day.  Her BUN and creatinine which had peaked at 45 and 3.1 were down to 35 and 2.6 at the time of discharge. Her urine had cleared. She was feeling much better.  She was to complete a total of 10 days of ciprofloxacin.  Although all blood and urine cultures have remained  negative at time of discharge it was felt that she most likely had urosepsis and responded to antibiotics.  DISCHARGE MEDICATIONS: 1. Trental 400 mg one daily. 2. Imdur 60 mg daily. 3. Actos 15 mg daily. 4. Pravachol 20 mg with supper. 5. Rocaltrol 0.25 mg daily. 6. Humalog insulin 75/25, 20 units in the morning, 5 units in the evening. 7. Travatan  0.004% eye drops, one in each eye at bedtime. 8. Azopt 1% eyedrops, one drop in left eye b.i.d.. 9. Cipro 500 mg one pill daily for 7 more days.  FOLLOW UP:  Patient is to follow up with [facility] approximately two weeks following discharge. Dictated by:   Amada Jupiter, P.A. Attending Physician:  Dayle Points DD:  05/16/01 TD:  05/17/01 Job: [id number]

## 2010-09-20 NOTE — Discharge Summary (Signed)
[place]. [facility]  Patient:    Erica Jimenez, Erica Jimenez                          MRN: [id number] Adm. Date:  [id number] Disc. Date: [id number] Attending:  Virgia Land                           Discharge Summary  DISCHARGE DIAGNOSES: 1. Gram negative septicemia. 2. Insulin-requiring diabetes mellitus. 3. Arteriosclerotic heart disease and peripheral vascular disease. 4. Superior mesenteric venous thrombosis. 5. Renal insufficiency secondary to diabetic nephropathy and muti-infarction    syndrome and Vancomycin-induced nephropathy.  REASON FOR ADMISSION:  This is one of several [place] hospitalizations for Erica Jimenez, a 77 year old diabetic, hypertensive woman with a three to four-day history of abdominal pain, nausea, vomiting, and diarrhea.  The patient was treated symptomatically for what was presumed to be a gastroenteritis, but when seen in the office was noted to be febrile to 101 with a markedly elevated blood sugar, and was referred for admission.  PERTINENT PHYSICAL FINDINGS:  VITAL SIGNS:  On admission, her blood pressure was 130/68, pulse rate 100, respiratory rate 20, temperature 101.7.  HEENT:  Sclerae anicteric.  There was no conjunctival pallor.  Pharynx had some mild injection but no exudates.  NECK:  Supple, no adenopathy, thyromegaly.  CHEST:  She had scattered rhonchi throughout both lungs fields but without any wheezes.  No focal dullness or decrease in her breath sounds.  ABDOMEN:  There was no focal tenderness but her bowel sounds were increased.  NEUROLOGIC:  She was lethargic but easily aroused, no focal dullness, no decrease in touch or vibration.  PERTINENT LABORATORY DATA:  Her white count was increased to 14,000.  She had a hematocrit of 31.5, hemoglobin 9.9.  Her sed rate was elevated to 121. Urinalysis showed white blood cells less than 5, red blood cells less than 5, she had yeast.  Her CMET was normal with the  exception of a reduced sodium of 129, glucose 239.  Her alkaline phosphatase was elevated at 201.  Bilirubin of 1.6 and her creatinine was 1.1, calcium of 8.4.  Her TSH was 4.46.  HOSPITAL COURSE:  The patient was admitted and cultured, both urine and blood, for her febrile state and leukocytosis.  The patients blood cultures were found to be positive, growing out gram negative rods, and with this finding her antibiotic therapy was switched to Unasyn 3 g IV q.8h. to gentamicin which was dosed using the standard protocol provided by the pharmacist here in the hospital.  The patients subsequent identification revealed that the blood was growing out Klebsiella which was sensitive to gentamicin, but was resistant to ampicillin.  As part of an evaluation of her abdomen, which was indicated because of her persistent complaints of abdominal cramping, her admission complaint of diarrhea, and elevated alkaline phosphatase, a CT scan was performed, indicating a superior mesenteric venous thrombosis and thickening of the distal ileum and cecum, felt to be related to mesenteric ischemia.  She had evidence of extensive arteriosclerotic changes involving the abdominal aorta and iliac arteries.  There were small bilateral renal hypodensities and small splenic hypodensities felt to be related to remote infarctions.  In view of the patients elevated alk phosphatase and a history of diabetes, an ultrasound of the abdomen was also done to evaluate her gallbladder.  This was basically unremarkable except for some mild gallbladder  thickening.  No pericholecystic fluid or gallstones were noted.  A hepatobiliary scan was done to exclude the possibility of a chylous cholecystitis, and this, too, was normal.  In view of the patients evidence of superior mesenteric venous thrombosis, she was started on a continuous infusion of heparin and subsequently Coumadinized.  In the setting of systemic anticoagulation with  a hematocrit of 24 and lower, it was felt that transfusion was in order.  The patient was transfused two units of red cells without any significant complications. There was no evidence of GI bleeding on several stool checks for occult blood. With her low iron over total iron binding capacity, it was felt that the patients anemia was most likely on the basis of her sepsis and chronic inflammatory processes.  Approximately one week into the course of her gentamicin therapy, a level was found to be significantly elevated, and there was a subsequent elevation and increase of her creatinine from 1.5 to the 3.8 range.  It was felt that this was most likely related to her gentamicin toxicity.  The patient was seen in consultation by Dr. 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  Slowly, over the course of the patients final week of hospitalization, her creatinine came down to 2.4 and was steadily declining when she was discharged.  She had no further complications during her hospital stay.  She was switched over from gentamicin to oral ciprofloxacin for continued treatment of gram negative Klebsiella infection.  She tolerated this medicine quite well without any elevation in her fever.  Patient was subsequently discharged in a condition that was significantly improved.  MEDICATIONS: 1. Actos 30 mg once a day as an insulin sensitizer. 2. Insulin 70/30 15 units each morning and 5 units in the evening. 3. She was instructed to continue Imdur 60 mg once a day. 4. Ecotrin 81 mg per day. 5. ______ 20 mg a day. 6. Tiazac 240 mg a day. 7. Coumadin 7.5 mg on odd days and 5 mg on even days.  DIET:  She was discharged on a no salt added, no concentrated sweets diet.  FOLLOW-UP:  Patient was scheduled to be  seen by me in two weeks and she is instructed to make new appointment to see her cardiologist in three weeks. DD:  09/26/99 TD:  09/29/99 Job: [id number]

## 2010-09-20 NOTE — Op Note (Signed)
NAME:  Erica Jimenez, Erica Jimenez                           ACCOUNT NO.:  [id number]   MEDICAL RECORD NO.:  [id number]                   PATIENT TYPE:  OIB   LOCATION:                                       FACILITY:  [facility]   PHYSICIAN:  Quita Skye. Hart Rochester, M.D.               DATE OF BIRTH:  1929-12-09   DATE OF PROCEDURE:  12/06/2003  DATE OF DISCHARGE:                                 OPERATIVE REPORT   PREOPERATIVE DIAGNOSIS:  End-stage renal disease.   POSTOPERATIVE DIAGNOSIS:  End-stage renal disease.   OPERATION:  Creation of a right radial artery to cephalic vein AV fistula  (Cimino shunt).   SURGEON:  Josephina Gip, M.D.   FIRST ASSISTANT:  Carolyn A. Eustaquio Boyden.   ANESTHESIA:  Local.   DESCRIPTION OF PROCEDURE:  The patient was taken to the operating room,  placed in the supine position at which time the right upper extremity was  prepped with Betadine scrub and solution and draped in routine sterile  manner.  After infiltration with 1% Xylocaine with epinephrine, a  longitudinal incision was made midway between the radial artery and cephalic  vein just proximal to the wrist.  Cephalic vein was dissected free, ligated  distally, transected and gently dilated with heparinized saline.  It was an  excellent vein being about 3 mm in size.  Artery was exposed beneath the  fascia, encircled with Vesi-loops and it was a 3 mm artery.  There were 3000  units of heparin given intravenously and artery occluded proximally and  distally with Vesi-loops, opened with 15 blade and extended with Potts  scissors.  Vein was carefully measured and spatulated, anastomosed end-to-  side with 7-0 Prolene.  Following this, the Vesi-loops released and there  was good pulse and thrill up to the antecubital area.  No protamine was  given.  The wound was irrigated with saline, closed in layers with Vicryl in  subcuticular fashion.  Sterile dressing applied.  The patient was taken to  the recovery room in  satisfactory condition.                                               Quita Skye Hart Rochester, M.D.    JDL/MEDQ  D:  12/06/2003  T:  12/06/2003  Job:  [id number]

## 2010-09-20 NOTE — Discharge Summary (Signed)
[place]. [facility]  Patient:    Erica Jimenez, Erica Jimenez Visit Number: [id number] MRN: [id number]          Service Type: MED Location: [phone] Attending Physician:  Lurlean Nanny Dictated by:   Lindell Spar. Chestine Spore, M.D. Admit Date:  04/07/2001 Discharge Date: 04/10/2001                             Discharge Summary  DISCHARGE DIAGNOSES:  1. Acute renal failure.  2. Septicemia.  3. Pyelonephritis.  4. Hypovolemia.  5. Diabetic renal disease.  6. Hypertensive renal disease.  7. Diabetic retinopathy.  8. History of arteriosclerotic heart disease and coronary artery disease,     arteriosclerotic peripheral vascular disease.  9. Hypercholesterolemia. 10. Hypokalemia.  REASON FOR ADMISSION:  This is one of multiple [facility] hospitalizations for Erica Jimenez, a 77 year old African-American woman who was brought into the emergency room with a chief complaint of generalized malaise, headache, fever, chills, nausea, and vomiting.  According to the staff, the patient had been seen in the emergency room about four to five days prior to this admission with similar complaints of back pain, myalgias, weakness and at that time was found to have significant blood in her urine with positive nitrites and was subsequently evaluated and felt to have a subacute pyelonephritis and was discharged home on Bactrim double strength one b.i.d. According to the patient, despite the antibiotics given, she continued to have back pain, generalized myalgias, but the fever and chills did resolve. Because of persistent nausea and weakness, she was brought back into the emergency room where she was evaluated on the day of admission and again is found to have an active urinary sediment suggesting a pyelonephritis.  LABORATORY DATA:  MRA of the abdomen which revealed severe arteriosclerotic disease of the abdominal aorta which did effect several other renal  arteries. There was no evidence of any asymmetric renal parenchymal mass and there was no evidence of hydronephrosis.  An assessment of the patients renal dimensions were well within normal and consistent with the ultrasound study of March 19, 2001.  The ultrasound showed normal size kidneys without any hydronephrosis.  She did have an increase in echogenecity felt to be related to medical renal disease.  A chest x-ray revealed significant increase in interstitial densities, but there was no acute infiltrates and only mild cardiac enlargement.  Mediastinum and hilum were negative for any adenopathy.  Her admission white count was 3800 with a left shift.  Her metabolic panel was normal with exception of a glucose of 221, BUN was 58, creatinine of 4.7.  Her SPE revealed a total protein of 6.7, albumin 661, alpha 1 carbon of 2.8, alpha 2 carbon that was slightly high at 12.7, beta globulin of 8.8 and gamma globulin of 14, all of which were normal.  These changes were felt to be nonspecific and nondiagnostic.  Her TSH and CK levels were normal.  Her iron was 45/260 with a 17% saturation and a ferritin level that was high at 858. Her hepatitis B core antibody was positive.  The hepatitis C antibody was negative.  Her PTH was elevated at 127 with a calcium that is low at 8.2.  Her C3 if 99, C4 39, both of which are normal.  Her blood cultures were negative x 3.  Stool for C.difficile was likewise negative.  AFB and bacterial cultures were again completely normal.  Her  neutrophil cytoplasmic antibody levels were less than 1 to 16.  HOSPITAL COURSE:  The patient was admitted with a working diagnosis of an acute pyelonephritis partially treated with Bactrim.  Her baseline creatinine levels in the office several months ago ranged from 2.3 to 2.6 and was now 4.7. It was felt that he acute reduction in renal function was related to her infection, acute glomerular nephritis and/or sepsis, or an  interstitial nephropathy related to the Bactrim.  She had taken no nonsteroidal anti-inflammatory agents or other medications known to reduce her renal function.  She was seen in consultation by the renal service who agreed that infection should be the #1 etiologic factor considered in her acute decompensation.  She was hydrated quite well and empirically started on Cipro at 400 mg IV every 12 hours.  Because of her anemia, felt to be related to her chronic renal disease, she was started on Epogen.  Slowly over the course of the next several days with treatment of her infection, the patient became much stronger.  Her anorexia and nausea slowly resolved.  Her blood sugars were well maintained with a combination of maintenance NPH and a fractional regimen of Humalog.  She did develop one episode of diarrhea while hospitalized and a stool for C.difficile was obtained and found to be completely negative. However, as quickly as the diarrhea started, it spontaneously resolved.  She had no further complications during this hospital course.  She was subsequently discharged after we happily noted that with treatment of her infection and with hydration, her creatinine fell from a high of 4.7 on admission back to her baseline of 2.3 to 2.5.  She is discharged home on a 3 gram sodium, 1600 calorie ADA, modified fat restricted diet.  DISCHARGE MEDICATIONS: 1. Ciprofloxacin 500 mg once a day. 2. Resume her Trental 400 mg three times a day with meals. 3. Imdur 60 mg once a day. 4. 75/25 Humalog mix at 20 units each morning and 5 units each p.m. 5. Calcitrol 0.25 mcg daily Monday/Wednesday/Friday. 6. Pravachol 20 mg once a day. 7. Actos 15 mg per day.  FOLLOW-UP:  The patient is scheduled to be seen by renal unit in two weeks and she will be seen in my office in three weeks. Dictated by:   Lindell Spar. Chestine Spore, M.D. Attending Physician:  Lurlean Nanny DD:  04/22/01 TD:  04/23/01 Job:  [id number]

## 2010-09-20 NOTE — Op Note (Signed)
   NAME:  Erica Jimenez, Erica Jimenez                           ACCOUNT NO.:  [id number]   MEDICAL RECORD NO.:  [id number]                   PATIENT TYPE:  AMB   LOCATION:  [facility]                                 FACILITY:  [facility]   PHYSICIAN:  Lindaann Slough, M.D.               DATE OF BIRTH:  1929/10/18   DATE OF PROCEDURE:  05/31/2002  DATE OF DISCHARGE:                                 OPERATIVE REPORT   PREOPERATIVE DIAGNOSIS:  Rule out recurrent bladder tumor.   POSTPROCEDURE DIAGNOSIS:  No recurrent bladder tumor.   PROCEDURE:  Cystoscopy and bladder biopsy.   SURGEON:  Lindaann Slough, M.D. and Crecencio Mc, M.D.   ANESTHESIA:  General.   INDICATION:  The patient is a 77 year old female who had a TUR bladder tumor  in 1/03.  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  She was treated with intravesical BCG.  She has not had any  recurrence since.  She is scheduled today for cystoscopy and bladder biopsy.   DESCRIPTION OF PROCEDURE:  Under general anesthesia, the patient was prepped  and draped and placed in the dorsal lithotomy position.  A #22 Wappler  cystoscope was inserted in the bladder.  The bladder mucosa is reddened at  the dome and the trigone.  There is no evidence of papillary tumor in the  bladder.  The ureteral orifices are normal in position and shape.  The  reddened areas of the dome and the trigone were biopsied with cold cup  biopsy forceps, then a biopsy of the right lateral wall and left lateral  wall of the bladder was done.  The areas of biopsy were then fulgurated with  a Bugbee electrode.  There was no evidence of bleeding at the end of the  procedure.   The cystoscope was then removed.   The patient tolerated the procedure well and left the OR in satisfactory  condition to the post anesthesia care unit.                                                 Lindaann Slough, M.D.    MN/MEDQ  D:  05/31/2002  T:  05/31/2002  Job:   [id number]   cc:   Crecencio Mc, M.D.  [address]  [place], [place] [address]  Fax: [phone]   Terrial Rhodes, M.D.  [address]  [place]  [place] [address]  Fax: [phone]   Margaretmary Bayley, M.D.  [address]  [place]  [place] [address]  Fax: [phone]

## 2010-09-20 NOTE — Op Note (Signed)
NAME:  Erica Jimenez, Erica Jimenez                             ACCOUNT NO.:  [id number]   MEDICAL RECORD NO.:  [id number]                    PATIENT TYPE:   LOCATION:                                       FACILITY:   PHYSICIAN:  Lindaann Slough, M.D.               DATE OF BIRTH:   DATE OF PROCEDURE:  11/30/2001  DATE OF DISCHARGE:                                 OPERATIVE REPORT   PREOPERATIVE DIAGNOSIS:  Rule out recurrent bladder tumor.   POSTOPERATIVE DIAGNOSIS:  No recurrent bladder tumor.   PROCEDURE DONE:  Cystoscopy and bladder biopsy.   SURGEON:  Lindaann Slough, M.D.   ANESTHESIA:  General.   INDICATIONS FOR PROCEDURE:  The patient is a 77 year old female, who had a  TUR bladder tumor for invasive transitional cell carcinoma.  She was  subsequently treated with intravesical BCG.  Follow-up cystoscopy in April  2003, showed no recurrent bladder tumor; however, she had atypical cells on  cytology.  She is scheduled for surveillance cystoscopy.   DESCRIPTION OF PROCEDURE:  Under general anesthesia, the patient was prepped  and draped and placed in the dorsal lithotomy position.  A #22 Wappler  cystoscope was inserted in the bladder.  The bladder mucosa is reddened.  There is no stone or gross tumor in the bladder.  The ureteral orifices are  in normal position and shape.  Then a random bladder biopsy was done.  The  areas of biopsy were then fulgurated with the Bugbee electrode.  Then the  bladder was irrigated with normal saline, and bladder washings were sent for  cytology.   The cystoscope was then removed.  A #19 Foley catheter was then inserted in  the bladder.  Then bimanual examination showed no evidence of pelvic mass,  and there was no evidence of fixation of the bladder to the surrounding  organs.  A B&O suppository was then placed in the rectum.   The patient tolerated the procedure well and left the OR in satisfactory  condition to the postanesthesia care unit.                                             Lindaann Slough, M.D.    MN/MEDQ  D:  11/30/2001  T:  12/01/2001  Job:  [id number]

## 2010-09-20 NOTE — Op Note (Signed)
   NAME:  Erica Jimenez, Erica Jimenez                             ACCOUNT NO.:  [id number]   MEDICAL RECORD NO.:  [id number]                   PATIENT TYPE:  AMB   LOCATION:  [facility]                                 FACILITY[facility]   PHYSICIAN:  Lindaann Slough, M.D.               DATE OF BIRTH:  [birth year]   DATE OF PROCEDURE:  03/01/2002  DATE OF DISCHARGE:                                 OPERATIVE REPORT   PREOPERATIVE DIAGNOSES:  Status post transurethral resection bladder tumor,  rule out recurrent bladder tumor.   POSTOPERATIVE DIAGNOSES:  No recurrent bladder tumor.   PROCEDURE:  Cystoscopy, bladder biopsy.   SURGEON:  Lindaann Slough, M.D.   ANESTHESIA:  General.   INDICATIONS FOR PROCEDURE:  The patient is a 77 year old female who had a  TUR bladder tumor in January 2003. She was treated with intravesical BCG and  she did not want to have maintenance BCG. Surveillance cystoscopy in July  2003 showed no recurrent bladder tumor. She is scheduled today for  surveillance cystoscopy.   DESCRIPTION OF PROCEDURE:  Under general anesthesia, the patient was prepped  and draped and placed in the dorsal lithotomy position. A #22 Wappler  cystoscope was inserted in the bladder. The bladder mucosa is reddened.  There is no stone or tumor in the bladder. The ureteral orifices are in  normal position and shape with clear efflux. The cystoscope was then  removed. A resectoscope was inserted in the bladder and the reddened areas  of the bladder were biopsied with cold loop. Then the areas of biopsy were  fulgurated. There was no evidence of bleeding at the end of the procedure.  The bladder was then irrigated with normal saline and bladder washings were  sent for cytology.   A #16 Foley catheter was then inserted in the bladder.   The patient tolerated the procedure well and left the OR in satisfactory  condition to post anesthesia care unit.                                               Lindaann Slough, M.D.    MN/MEDQ  D:  03/01/2002  T:  03/01/2002  Job:  [id number]   cc:   Margaretmary Bayley, MD  [address]  [place]  [place] [address]  Fax: [phone]   Terrial Rhodes, MD  [address] [place]  [place]  [place] [address]  Fax: [phone]

## 2010-09-20 NOTE — Op Note (Signed)
[facility]  Patient:    Erica Jimenez, Erica Jimenez Visit Number: [id number] MRN: [id number]          Service Type: DSU Location: DAY Attending Physician:  Lindaann Slough Dictated by:   Lindaann Slough, M.D. Proc. Date: 05/19/01 Admit Date:  05/19/2001   CC:         Lindell Spar. Chestine Spore, M.D.   Operative Report  PREOPERATIVE DIAGNOSIS:  Bladder tumor.  POSTOPERATIVE DIAGNOSIS:  Bladder tumor.  PROCEDURE:  Cystoscopy and transurethral resection of bladder tumor.  SURGEON:  Lindaann Slough, M.D.  ANESTHESIA:  Spinal.  INDICATION:  The patient  is a 77 year old female who had gross, painless hematuria. She also had a history of renal insufficiency. A CT scan of the abdomen and pelvis without IV contrast showed normal kidneys and a mass on the left lateral wall of the bladder. Cystoscopy showed a papillary tumor on the left lateral wall of the bladder above the ureteral orifices. She is scheduled today for TUR of bladder tumor.  DESCRIPTION OF PROCEDURE:  Under spinal anesthesia, the patient was prepped and draped and placed in the dorsal lithotomy position. A #22 Wappler cystoscope was inserted in the bladder. There was a large papillary tumor on the left lateral wall of the bladder above the ureterolysis. The ureteral orifices are in normal position and shape. There is no evidence of further tumor in the bladder. The cystoscope was then removed. A #28 continuous flow resectoscope was inserted in the bladder. Resection of the tumor was done. The base of the bladder tumor was also resected for staging purposes. Hemostasis was secured with electrocautery. The resectoscope was removed. The cystoscope was then reinserted in the bladder and a random cold cup biopsy of the bladder was done. The cystoscope was removed. The resectoscope was then reinserted in the bladder. Fulguration of the areas of biopsy was done. The specimen was irrigated out of the bladder. There  was no evidence of bleeding at the end of the procedure. The resectoscope was removed. A #20 Foley catheter was inserted in the bladder.  The patient tolerated the procedure well and left the OR in satisfactory condition to the postanesthesia care unit. Dictated by:   Lindaann Slough, M.D. Attending Physician:  Lindaann Slough DD:  05/19/01 TD:  05/20/01 Job: [id number]

## 2010-09-20 NOTE — Op Note (Signed)
[facility]  Patient:    VALOR, [place] Visit Number: [id number] MRN: [id number]          Service Type: REC Location: [facility] Attending Physician:  Dayle Points Dictated by:   Lindaann Slough, M.D. Proc. Date: 08/31/01 Admit Date:  07/30/2001                             Operative Report  PREOPERATIVE DIAGNOSES:  Rule out recurrent bladder tumor, status post transurethral resection of bladder tumor.  POSTOPERATIVE DIAGNOSES:  Not given.  PROCEDURE:  Cystoscopy, bladder biopsy.  SURGEON:  Lindaann Slough, M.D.  ANESTHESIA:  General.  INDICATIONS FOR PROCEDURE:  The patient is a 77 year old female who had a TUR bladder tumor about three months ago. She had invasive transitional cell carcinoma without muscle invasion. She was then treated with intravesical BCG. She is scheduled today for a surveillance cystoscopy.  DESCRIPTION OF PROCEDURE:  Under general anesthesia, the patient was prepped and draped and placed in the dorsal lithotomy position. A #22 Wappler cystoscope was inserted in the bladder. The bladder mucosa is reddened. There is no evidence of stone or tumor in the bladder. The urethral orifices are in normal position and shape with clear efflux. The reddened areas of the bladder were biopsied with the cold cup biopsy forceps. Then a random bladder biopsy was done. Then the areas of biopsy for fulgurated with the Bugbee electrode. There was no evidence of bleeding at the end of the procedure. Then the bladder was irrigated with normal saline and bladder washings were sent for cytology.  The patient tolerated the procedure well and left the OR in satisfactory condition to post anesthesia care unit. Dictated by:   Lindaann Slough, M.D. Attending Physician:  Dayle Points DD:  08/31/01 TD:  08/31/01 Job: [id number] [id number]

## 2010-10-21 ENCOUNTER — Encounter: Payer: Self-pay | Admitting: Cardiology

## 2011-02-26 ENCOUNTER — Other Ambulatory Visit: Payer: Self-pay | Admitting: Cardiology

## 2011-02-27 ENCOUNTER — Ambulatory Visit (INDEPENDENT_AMBULATORY_CARE_PROVIDER_SITE_OTHER): Payer: Medicare Other | Admitting: Cardiology

## 2011-02-27 ENCOUNTER — Encounter: Payer: Self-pay | Admitting: Cardiology

## 2011-02-27 DIAGNOSIS — I359 Nonrheumatic aortic valve disorder, unspecified: Secondary | ICD-10-CM

## 2011-02-27 DIAGNOSIS — I509 Heart failure, unspecified: Secondary | ICD-10-CM

## 2011-02-27 DIAGNOSIS — R0989 Other specified symptoms and signs involving the circulatory and respiratory systems: Secondary | ICD-10-CM

## 2011-02-27 DIAGNOSIS — I5032 Chronic diastolic (congestive) heart failure: Secondary | ICD-10-CM

## 2011-02-27 DIAGNOSIS — R0602 Shortness of breath: Secondary | ICD-10-CM

## 2011-02-27 DIAGNOSIS — E785 Hyperlipidemia, unspecified: Secondary | ICD-10-CM

## 2011-02-27 DIAGNOSIS — I2510 Atherosclerotic heart disease of native coronary artery without angina pectoris: Secondary | ICD-10-CM

## 2011-02-27 DIAGNOSIS — I739 Peripheral vascular disease, unspecified: Secondary | ICD-10-CM

## 2011-02-27 DIAGNOSIS — I2589 Other forms of chronic ischemic heart disease: Secondary | ICD-10-CM

## 2011-02-27 DIAGNOSIS — I6529 Occlusion and stenosis of unspecified carotid artery: Secondary | ICD-10-CM

## 2011-02-27 DIAGNOSIS — F17200 Nicotine dependence, unspecified, uncomplicated: Secondary | ICD-10-CM

## 2011-02-27 NOTE — Assessment & Plan Note (Signed)
Stable, no chest pain.  Continue ASA, plavix, bisoprolol, statin.

## 2011-02-27 NOTE — Assessment & Plan Note (Signed)
I will check lipids with goal LDL < 70.

## 2011-02-27 NOTE — Progress Notes (Signed)
Primary Provider:  Jarome Matin  History of Present Illness: 77 yo with PAD, CKD, CAD, mild ischemic CMP presents for followup to cardiology clinic.  Very mild dyspnea after walking 100 yards.  She can climb a flight of steps with mild dyspnea.  She is able to walk about 100 yards before her calves tighten, right > left.  She is still smoking 2-3 cigs/day.  No chest pain.  Patient has gained about 6 lbs since last appointment but blames this on poor diet rather than fluid retention.   She is not getting much exercise.   ECG:  NSR, old inferior MI, RBBB, rate 49  Labs (1/10): LDL 66, HDL 61, creatinine 2.32 Labs (10/10): creatinine 2.1 Labs (10/11): K 3.8, creatinine 2.13, K 3.8 Labs (1/12): creatinine 2.4, HCT 35.5  Allergies (verified):  1)  ! Sulfa  Past Medical History: 1.  DM2 2.  Hyperlipidemia 3.  CAD:  Pt is s/p anterior MI in 1996 with stent placed in LAD.  Last myoview in our office was in 7/05 and showed apical anterior, apical, and apical inferior infarct.  Minimal peri-infarct ischemia.  EF 49%.  4. Hypothyroidism 5. Smoking: < 1ppd 6. Prior ETOH abuse 7. SMV thrombosis in 2001, coumadin x 6 mos 8. HTN 9. CVA 10. Bladder CA s/p resection and BCG treatment.  11. PAD: arterial doppler study 12/04 suggestive of > 50% bilateral SFA stenosis.  Pt has mild claudication. No invasive evaluation given CKD and desire to avoid contrast use.  12. CKD:  Pt is stage III-IV.  She had a fistula placed but is not on dialysis.  She follows with Dr. Eliott Nine for nephrology.  13. gout 14.  Ischemic CMP:  echo (4/11) EF 40-45%, akinesis of the mid to apical inferoseptum, apical inferior wall, and true apex.  Mild diastolic dysfunction.  Mild-moderate AS with mean gradient 16 mmHg, mild aortic insufficiency, mild mitral regurgitation.  Not able to tolerate Coreg 12.5 mg two times a day.  15.  Mild-moderate aortic stenosis.   16.  Carotid dopplers (3/10):  40-59% RICA.  Carotid dopplers (4/11):  40-59% RICA.  Carotid dopplers (4/12): RICA 40-59%.   Family History: Noncontributory.   Social History: Married, lives in [place] with her husband.  Her daughter lives in town.  She smokes 2-3 cigs.  She used to abuse ETOH but no longer drinks.  ROS: All systems reviewed and negative except as per HPI.    Current Outpatient Prescriptions  Medication Sig Dispense Refill  . amLODipine (NORVASC) 2.5 MG tablet take 1 tablet by mouth once daily  30 tablet  5  . aspirin 81 MG tablet Take 81 mg by mouth daily.        Marland Kitchen atorvastatin (LIPITOR) 80 MG tablet Take 80 mg by mouth daily.        . bisoprolol (ZEBETA) 5 MG tablet take 1/2 tablet by mouth once daily  15 tablet  6  . brinzolamide (AZOPT) 1 % ophthalmic suspension Place 1 drop into both eyes 3 (three) times daily.       . clopidogrel (PLAVIX) 75 MG tablet Take 75 mg by mouth daily.        . Ferrous Gluconate (FERGON PO) Take by mouth.        . FOLIC ACID PO 2.2 mg. 1 tab po qd      . furosemide (LASIX) 40 MG tablet Take 40 mg by mouth daily.        . hydrALAZINE (APRESOLINE) 25 MG  tablet Take 1 and 1/2 tablets three times a day.  135 tablet  11  . insulin detemir (LEVEMIR) 100 UNIT/ML injection Inject 6 Units into the skin at bedtime.        . isosorbide mononitrate (IMDUR) 60 MG 24 hr tablet Take 60 mg by mouth daily.        Marland Kitchen LATANOPROST OP Apply to eye.        . levothyroxine (SYNTHROID, LEVOTHROID) 50 MCG tablet Take 50 mcg by mouth daily.        . nitroGLYCERIN (NITROSTAT) 0.4 MG SL tablet Place 0.4 mg under the tongue every 5 (five) minutes as needed. Up to 3 doses       . Polyethylene Glycol 3350 POWD Take one cap full (17g) dissolved into 8 ounces of fluid by mouth once daily       . insulin aspart (NOVOLOG) 100 UNIT/ML injection Inject into the skin 3 (three) times daily before meals. 6 units breakfast, 5 units lunch, 5 units suppper        BP 130/82  Pulse 55  Ht 5\' 4"  (1.626 m)  Wt 168 lb (76.204 kg)  BMI 28.84 kg/m2   SpO2 97% General:  Well developed, well nourished, in no acute distress. Neck:  Neck supple, no JVD. No masses, thyromegaly or abnormal cervical nodes. Lungs:  Clear bilaterally to auscultation and percussion.  Mildly decreased breath sounds bilaterally.  Heart:  Non-displaced PMI, chest non-tender; slightly irregular rate and rhythm, S1, S2, 2/6 early SEM.  No gallop. Right carotid bruit. No ankle edema, no varicosities.  Unable to palpate pedal pulses but feet warm.   Abdomen:  Bowel sounds positive; abdomen soft and non-tender without masses, organomegaly, or hernias noted. No hepatosplenomegaly. Extremities:  No clubbing or cyanosis. Neurologic:  Alert and oriented x 3.

## 2011-02-27 NOTE — Assessment & Plan Note (Signed)
Stable RICA stenosis, repeat carotid dopplers in 4/13.

## 2011-02-27 NOTE — Assessment & Plan Note (Addendum)
Euvolemic on exam, NYHA class II symptoms.  EF 40-45%.  She is tolerating bisoprolol better than Coreg.  I will not increase bisoprolol given resting bradycardia (HR around 50).  No ACEI or aldosterone antagonist due to renal insufficiency.  I will have her continue Imdur and hydralazine as well as Lasix at current dose. I will check BMET and BNP today.     Followup in 6 months if she continues to be stable symptomatically.

## 2011-02-27 NOTE — Assessment & Plan Note (Signed)
Mild to moderate AS.  Echo in 2/13 to re-evaluation.

## 2011-02-27 NOTE — Patient Instructions (Signed)
Your physician has requested that you have a lower  extremity arterial duplex. This test is an ultrasound of the arteries in the legs . It looks at arterial blood flow in the legs . Allow one hour for Lower  Arterial scans. There are no restrictions or special instructions. In the next week or two.   Your physician recommends that you return for a FASTING lipid profile/ BMP/BNP 414.01  428.32--You can have this done when you come for the lower extremity arterial doppler.   Your physician has requested that you have an echocardiogram. Echocardiography is a painless test that uses sound waves to create images of your heart. It provides your doctor with information about the size and shape of your heart and how well your heart's chambers and valves are working. This procedure takes approximately one hour. There are no restrictions for this procedure. February 2013  Your physician has requested that you have a carotid duplex. This test is an ultrasound of the carotid arteries in your neck. It looks at blood flow through these arteries that supply the brain with blood. Allow one hour for this exam. There are no restrictions or special instructions.February 2013  Your physician wants you to follow-up in: 6 months with Dr Shirlee Latch. (April 2013). You will receive a reminder letter in the mail two months in advance. If you don't receive a letter, please call our office to schedule the follow-up appointment.

## 2011-02-27 NOTE — Assessment & Plan Note (Signed)
Claudication seems stable.  She has not had peripheral arterial dopplers done in a number of years.  I will go ahead and get repeat peripheral arterial dopplers to assess for progression of disease.

## 2011-02-27 NOTE — Assessment & Plan Note (Signed)
I again encouraged her to quit.  She is only smoking a few cigarettes a day.  I suggested that she try an electronic cigarette.

## 2011-03-14 ENCOUNTER — Encounter (INDEPENDENT_AMBULATORY_CARE_PROVIDER_SITE_OTHER): Payer: Medicare Other | Admitting: *Deleted

## 2011-03-14 ENCOUNTER — Other Ambulatory Visit (INDEPENDENT_AMBULATORY_CARE_PROVIDER_SITE_OTHER): Payer: Medicare Other | Admitting: *Deleted

## 2011-03-14 DIAGNOSIS — I739 Peripheral vascular disease, unspecified: Secondary | ICD-10-CM

## 2011-03-14 DIAGNOSIS — I509 Heart failure, unspecified: Secondary | ICD-10-CM

## 2011-03-14 DIAGNOSIS — I2510 Atherosclerotic heart disease of native coronary artery without angina pectoris: Secondary | ICD-10-CM

## 2011-03-14 DIAGNOSIS — R0602 Shortness of breath: Secondary | ICD-10-CM

## 2011-03-14 DIAGNOSIS — I359 Nonrheumatic aortic valve disorder, unspecified: Secondary | ICD-10-CM

## 2011-03-14 DIAGNOSIS — I6529 Occlusion and stenosis of unspecified carotid artery: Secondary | ICD-10-CM

## 2011-03-14 DIAGNOSIS — I5032 Chronic diastolic (congestive) heart failure: Secondary | ICD-10-CM

## 2011-03-14 LAB — LIPID PANEL
HDL: 52 mg/dL (ref >39.00)
LDL Cholesterol: 52 mg/dL (ref 0–99)
Total CHOL/HDL Ratio: 2
Triglycerides: 70 mg/dL (ref 0.0–149.0)

## 2011-03-14 LAB — BASIC METABOLIC PANEL
Calcium: 9 mg/dL (ref 8.4–10.5)
GFR: 27.1 mL/min — ABNORMAL LOW (ref >60.00)
Glucose, Bld: 126 mg/dL — ABNORMAL HIGH (ref 70–99)
Sodium: 140 mEq/L (ref 135–145)

## 2011-03-26 ENCOUNTER — Telehealth: Payer: Self-pay | Admitting: Cardiology

## 2011-03-26 NOTE — Telephone Encounter (Signed)
Spoke with pt, aware of LEA results Erica Jimenez

## 2011-03-26 NOTE — Telephone Encounter (Signed)
Fu call °Pt returning your call  °

## 2011-03-27 ENCOUNTER — Other Ambulatory Visit: Payer: Self-pay | Admitting: Cardiology

## 2011-03-29 ENCOUNTER — Other Ambulatory Visit: Payer: Self-pay | Admitting: Cardiology

## 2011-05-28 DIAGNOSIS — E11319 Type 2 diabetes mellitus with unspecified diabetic retinopathy without macular edema: Secondary | ICD-10-CM | POA: Diagnosis not present

## 2011-05-28 DIAGNOSIS — H04129 Dry eye syndrome of unspecified lacrimal gland: Secondary | ICD-10-CM | POA: Diagnosis not present

## 2011-05-28 DIAGNOSIS — H11159 Pinguecula, unspecified eye: Secondary | ICD-10-CM | POA: Diagnosis not present

## 2011-05-28 DIAGNOSIS — H18419 Arcus senilis, unspecified eye: Secondary | ICD-10-CM | POA: Diagnosis not present

## 2011-06-11 ENCOUNTER — Encounter (INDEPENDENT_AMBULATORY_CARE_PROVIDER_SITE_OTHER): Payer: Medicare Other | Admitting: Ophthalmology

## 2011-06-11 DIAGNOSIS — E11319 Type 2 diabetes mellitus with unspecified diabetic retinopathy without macular edema: Secondary | ICD-10-CM

## 2011-06-11 DIAGNOSIS — E1139 Type 2 diabetes mellitus with other diabetic ophthalmic complication: Secondary | ICD-10-CM

## 2011-06-11 DIAGNOSIS — E1165 Type 2 diabetes mellitus with hyperglycemia: Secondary | ICD-10-CM | POA: Diagnosis not present

## 2011-06-11 DIAGNOSIS — H3581 Retinal edema: Secondary | ICD-10-CM | POA: Diagnosis not present

## 2011-06-11 DIAGNOSIS — I10 Essential (primary) hypertension: Secondary | ICD-10-CM

## 2011-06-11 DIAGNOSIS — H35039 Hypertensive retinopathy, unspecified eye: Secondary | ICD-10-CM

## 2011-06-11 DIAGNOSIS — H43819 Vitreous degeneration, unspecified eye: Secondary | ICD-10-CM

## 2011-06-18 ENCOUNTER — Ambulatory Visit (INDEPENDENT_AMBULATORY_CARE_PROVIDER_SITE_OTHER): Payer: Medicare Other | Admitting: Ophthalmology

## 2011-06-18 DIAGNOSIS — H3581 Retinal edema: Secondary | ICD-10-CM

## 2011-06-20 DIAGNOSIS — Q828 Other specified congenital malformations of skin: Secondary | ICD-10-CM | POA: Diagnosis not present

## 2011-06-20 DIAGNOSIS — L608 Other nail disorders: Secondary | ICD-10-CM | POA: Diagnosis not present

## 2011-06-20 DIAGNOSIS — E1149 Type 2 diabetes mellitus with other diabetic neurological complication: Secondary | ICD-10-CM | POA: Diagnosis not present

## 2011-06-24 ENCOUNTER — Ambulatory Visit (HOSPITAL_COMMUNITY): Payer: Medicare Other | Attending: Cardiology

## 2011-06-24 ENCOUNTER — Encounter: Payer: Medicare Other | Admitting: *Deleted

## 2011-06-24 ENCOUNTER — Other Ambulatory Visit: Payer: Self-pay

## 2011-06-24 DIAGNOSIS — I379 Nonrheumatic pulmonary valve disorder, unspecified: Secondary | ICD-10-CM | POA: Diagnosis not present

## 2011-06-24 DIAGNOSIS — I080 Rheumatic disorders of both mitral and aortic valves: Secondary | ICD-10-CM | POA: Diagnosis not present

## 2011-06-24 DIAGNOSIS — E119 Type 2 diabetes mellitus without complications: Secondary | ICD-10-CM | POA: Insufficient documentation

## 2011-06-24 DIAGNOSIS — I10 Essential (primary) hypertension: Secondary | ICD-10-CM | POA: Insufficient documentation

## 2011-06-24 DIAGNOSIS — I2510 Atherosclerotic heart disease of native coronary artery without angina pectoris: Secondary | ICD-10-CM | POA: Insufficient documentation

## 2011-06-24 DIAGNOSIS — F17200 Nicotine dependence, unspecified, uncomplicated: Secondary | ICD-10-CM | POA: Diagnosis not present

## 2011-06-24 DIAGNOSIS — E785 Hyperlipidemia, unspecified: Secondary | ICD-10-CM | POA: Diagnosis not present

## 2011-06-24 DIAGNOSIS — I359 Nonrheumatic aortic valve disorder, unspecified: Secondary | ICD-10-CM | POA: Diagnosis not present

## 2011-06-24 DIAGNOSIS — I079 Rheumatic tricuspid valve disease, unspecified: Secondary | ICD-10-CM | POA: Diagnosis not present

## 2011-06-25 DIAGNOSIS — I739 Peripheral vascular disease, unspecified: Secondary | ICD-10-CM | POA: Diagnosis not present

## 2011-06-25 DIAGNOSIS — H6120 Impacted cerumen, unspecified ear: Secondary | ICD-10-CM | POA: Diagnosis not present

## 2011-06-25 DIAGNOSIS — E1159 Type 2 diabetes mellitus with other circulatory complications: Secondary | ICD-10-CM | POA: Diagnosis not present

## 2011-06-25 DIAGNOSIS — I10 Essential (primary) hypertension: Secondary | ICD-10-CM | POA: Diagnosis not present

## 2011-07-01 ENCOUNTER — Encounter (INDEPENDENT_AMBULATORY_CARE_PROVIDER_SITE_OTHER): Payer: Medicare Other

## 2011-07-01 DIAGNOSIS — I6529 Occlusion and stenosis of unspecified carotid artery: Secondary | ICD-10-CM

## 2011-07-17 DIAGNOSIS — I10 Essential (primary) hypertension: Secondary | ICD-10-CM | POA: Diagnosis not present

## 2011-07-17 DIAGNOSIS — N184 Chronic kidney disease, stage 4 (severe): Secondary | ICD-10-CM | POA: Diagnosis not present

## 2011-07-17 DIAGNOSIS — E1129 Type 2 diabetes mellitus with other diabetic kidney complication: Secondary | ICD-10-CM | POA: Diagnosis not present

## 2011-07-17 DIAGNOSIS — F17200 Nicotine dependence, unspecified, uncomplicated: Secondary | ICD-10-CM | POA: Diagnosis not present

## 2011-07-21 DIAGNOSIS — I129 Hypertensive chronic kidney disease with stage 1 through stage 4 chronic kidney disease, or unspecified chronic kidney disease: Secondary | ICD-10-CM | POA: Diagnosis not present

## 2011-07-21 DIAGNOSIS — I10 Essential (primary) hypertension: Secondary | ICD-10-CM | POA: Diagnosis not present

## 2011-07-21 DIAGNOSIS — E039 Hypothyroidism, unspecified: Secondary | ICD-10-CM | POA: Diagnosis not present

## 2011-07-21 DIAGNOSIS — N184 Chronic kidney disease, stage 4 (severe): Secondary | ICD-10-CM | POA: Diagnosis not present

## 2011-07-21 DIAGNOSIS — N2581 Secondary hyperparathyroidism of renal origin: Secondary | ICD-10-CM | POA: Diagnosis not present

## 2011-08-02 ENCOUNTER — Encounter: Payer: Self-pay | Admitting: Cardiology

## 2011-08-09 ENCOUNTER — Other Ambulatory Visit: Payer: Self-pay | Admitting: Cardiology

## 2011-08-29 ENCOUNTER — Other Ambulatory Visit: Payer: Self-pay | Admitting: Cardiology

## 2011-09-26 DIAGNOSIS — E1149 Type 2 diabetes mellitus with other diabetic neurological complication: Secondary | ICD-10-CM | POA: Diagnosis not present

## 2011-09-26 DIAGNOSIS — L608 Other nail disorders: Secondary | ICD-10-CM | POA: Diagnosis not present

## 2011-10-08 DIAGNOSIS — E119 Type 2 diabetes mellitus without complications: Secondary | ICD-10-CM | POA: Diagnosis not present

## 2011-10-08 DIAGNOSIS — E039 Hypothyroidism, unspecified: Secondary | ICD-10-CM | POA: Diagnosis not present

## 2011-10-08 DIAGNOSIS — E785 Hyperlipidemia, unspecified: Secondary | ICD-10-CM | POA: Diagnosis not present

## 2011-10-08 DIAGNOSIS — N184 Chronic kidney disease, stage 4 (severe): Secondary | ICD-10-CM | POA: Diagnosis not present

## 2011-10-15 DIAGNOSIS — I739 Peripheral vascular disease, unspecified: Secondary | ICD-10-CM | POA: Diagnosis not present

## 2011-10-15 DIAGNOSIS — Z0000 Encounter for general adult medical examination without abnormal findings: Secondary | ICD-10-CM | POA: Diagnosis not present

## 2011-10-15 DIAGNOSIS — E1159 Type 2 diabetes mellitus with other circulatory complications: Secondary | ICD-10-CM | POA: Diagnosis not present

## 2011-10-15 DIAGNOSIS — J438 Other emphysema: Secondary | ICD-10-CM | POA: Diagnosis not present

## 2011-10-20 ENCOUNTER — Ambulatory Visit (INDEPENDENT_AMBULATORY_CARE_PROVIDER_SITE_OTHER): Payer: Medicare Other | Admitting: Ophthalmology

## 2011-10-20 ENCOUNTER — Encounter (INDEPENDENT_AMBULATORY_CARE_PROVIDER_SITE_OTHER): Payer: Medicare Other | Admitting: Ophthalmology

## 2011-10-20 DIAGNOSIS — E11319 Type 2 diabetes mellitus with unspecified diabetic retinopathy without macular edema: Secondary | ICD-10-CM | POA: Diagnosis not present

## 2011-10-20 DIAGNOSIS — E1139 Type 2 diabetes mellitus with other diabetic ophthalmic complication: Secondary | ICD-10-CM | POA: Diagnosis not present

## 2011-10-20 DIAGNOSIS — E1165 Type 2 diabetes mellitus with hyperglycemia: Secondary | ICD-10-CM | POA: Diagnosis not present

## 2011-10-20 DIAGNOSIS — H2700 Aphakia, unspecified eye: Secondary | ICD-10-CM | POA: Diagnosis not present

## 2011-10-20 DIAGNOSIS — H43819 Vitreous degeneration, unspecified eye: Secondary | ICD-10-CM

## 2011-11-05 ENCOUNTER — Ambulatory Visit (INDEPENDENT_AMBULATORY_CARE_PROVIDER_SITE_OTHER): Payer: Medicare Other | Admitting: Cardiology

## 2011-11-05 ENCOUNTER — Encounter: Payer: Self-pay | Admitting: Cardiology

## 2011-11-05 VITALS — BP 142/74 | HR 60 | Ht 64.0 in | Wt 165.0 lb

## 2011-11-05 DIAGNOSIS — I2510 Atherosclerotic heart disease of native coronary artery without angina pectoris: Secondary | ICD-10-CM

## 2011-11-05 DIAGNOSIS — I739 Peripheral vascular disease, unspecified: Secondary | ICD-10-CM

## 2011-11-05 DIAGNOSIS — I359 Nonrheumatic aortic valve disorder, unspecified: Secondary | ICD-10-CM

## 2011-11-05 DIAGNOSIS — I2589 Other forms of chronic ischemic heart disease: Secondary | ICD-10-CM

## 2011-11-05 DIAGNOSIS — F17200 Nicotine dependence, unspecified, uncomplicated: Secondary | ICD-10-CM

## 2011-11-05 DIAGNOSIS — R0989 Other specified symptoms and signs involving the circulatory and respiratory systems: Secondary | ICD-10-CM

## 2011-11-05 MED ORDER — HYDRALAZINE HCL 50 MG PO TABS: 50.0000 mg | ORAL_TABLET | Freq: Three times a day (TID) | ORAL | Status: DC

## 2011-11-05 MED ORDER — NITROGLYCERIN 0.4 MG SL SUBL: 0.4000 mg | SUBLINGUAL_TABLET | SUBLINGUAL | Status: DC | PRN

## 2011-11-05 NOTE — Patient Instructions (Addendum)
Increase Hydralazine 50 mg three times a day   Your physician wants you to follow-up in: 6 months You will receive a reminder letter in the mail two months in advance. If you don't receive a letter, please call our office to schedule the follow-up appointment.

## 2011-11-07 NOTE — Assessment & Plan Note (Signed)
Mild RICA stenosis.  Repeat carotids in 2/14.

## 2011-11-07 NOTE — Progress Notes (Signed)
Patient ID: Erica Jimenez, female   DOB: 05/22/1929, 78 y.o.   MRN: [id number] Primary Provider:  Jarome Matin  History of Present Illness: 78 yo with PAD, CKD, CAD, mild ischemic CMP presents for followup to cardiology clinic.  Very mild dyspnea after walking 100 yards.  She can climb a flight of steps with mild dyspnea.  She is able to walk about 100 yards before her calves tighten, right > left.  There has been no change in this and if anything, it is improved.  She is still smoking 1/3 ppd.  No chest pain.  She has had some nausea episodes that seem to be related to fluctuations in her blood glucose.  EF 45-50% on last echo in February.   ECG:  NSR, old inferior MI, RBBB, inferior and anterolateral TWIs  Labs (1/10): LDL 66, HDL 61, creatinine 2.32 Labs (10/10): creatinine 2.1 Labs (10/11): K 3.8, creatinine 2.13, K 3.8 Labs (1/12): creatinine 2.4, HCT 35.5 Labs (6/13): K 4.7, creatinine 2.2, LDL 68, HDL 47  Allergies (verified):  1)  ! Sulfa  Past Medical History: 1.  DM2 2.  Hyperlipidemia 3.  CAD:  Pt is s/p anterior MI in 1996 with stent placed in LAD.  Last myoview in our office was in 7/05 and showed apical anterior, apical, and apical inferior infarct.  Minimal peri-infarct ischemia.  EF 49%.  4. Hypothyroidism 5. Smoking: < 1ppd 6. Prior ETOH abuse 7. SMV thrombosis in 2001, coumadin x 6 mos 8. HTN 9. CVA 10. Bladder CA s/p resection and BCG treatment.  11. PAD: arterial doppler study 12/04 suggestive of > 50% bilateral SFA stenosis.  Pt has mild claudication. No invasive evaluation given CKD and desire to avoid contrast use.  ABIs (11/12): 0.57 on right, 0.68 on left.  12. CKD:  Pt is stage III-IV.  She had a fistula placed but is not on dialysis.  She follows with Dr. Eliott Nine for nephrology.  13. gout 14.  Ischemic CMP:  echo (4/11) EF 40-45%, akinesis of the mid to apical inferoseptum, apical inferior wall, and true apex.  Mild diastolic dysfunction.  Mild-moderate AS  with mean gradient 16 mmHg, mild aortic insufficiency, mild mitral regurgitation.  Echo (2/13) with EF 45-50%, mid-apical inferior and septal AK, mild AS. Not able to tolerate Coreg 12.5 mg two times a day.  15.  Aortic stenosis: Mild by last echo in 2/13.   16.  Carotid dopplers (3/10):  40-59% RICA.  Carotid dopplers (4/11): 40-59% RICA.  Carotid dopplers (4/12): RICA 40-59%. Carotids (2/13): 40-59% RICA.   Family History: Noncontributory.   Social History: Married, lives in [place] with her husband.  Her daughter lives in town.  She smokes 2-3 cigs.  She used to abuse ETOH but no longer drinks.  ROS: All systems reviewed and negative except as per HPI.    Current Outpatient Prescriptions  Medication Sig Dispense Refill  . amLODipine (NORVASC) 2.5 MG tablet take 1 tablet by mouth once daily  30 tablet  5  . aspirin 81 MG tablet Take 81 mg by mouth daily.        Marland Kitchen atorvastatin (LIPITOR) 80 MG tablet Take 80 mg by mouth daily.        . bisoprolol (ZEBETA) 5 MG tablet take 1/2 tablet by mouth once daily  15 tablet  6  . brinzolamide (AZOPT) 1 % ophthalmic suspension Place 1 drop into both eyes 3 (three) times daily.       . calcitRIOL (ROCALTROL)  0.25 MCG capsule Take 0.25 mcg by mouth 3 (three) times a week.      . clopidogrel (PLAVIX) 75 MG tablet Take 75 mg by mouth daily.        . DHA-EPA-Flaxseed Oil-Vitamin E (THERA TEARS NUTRITION PO) Take by mouth as needed.      . Ferrous Gluconate (FERGON PO) Take by mouth.        . FOLIC ACID PO 2.2 mg. 1 tab po qd      . furosemide (LASIX) 40 MG tablet Take 40 mg by mouth daily.        . insulin aspart (NOVOLOG) 100 UNIT/ML injection Inject into the skin 3 (three) times daily before meals. 6 units breakfast, 5 units lunch, 5 units suppper       . insulin detemir (LEVEMIR) 100 UNIT/ML injection Inject 6 Units into the skin at bedtime.        . isosorbide mononitrate (IMDUR) 60 MG 24 hr tablet Take 60 mg by mouth daily.        Marland Kitchen LATANOPROST OP  Apply to eye.        . levothyroxine (SYNTHROID, LEVOTHROID) 50 MCG tablet Take 50 mcg by mouth daily.        . nitroGLYCERIN (NITROSTAT) 0.4 MG SL tablet Place 1 tablet (0.4 mg total) under the tongue every 5 (five) minutes as needed. Up to 3 doses  25 tablet  3  . Polyethylene Glycol 3350 POWD Take one cap full (17g) dissolved into 8 ounces of fluid by mouth once daily       . hydrALAZINE (APRESOLINE) 50 MG tablet Take 1 tablet (50 mg total) by mouth 3 (three) times daily.  100 tablet  6   BP 142/74  Pulse 60  Ht 5\' 4"  (1.626 m)  Wt 74.844 kg (165 lb)  BMI 28.32 kg/m2 General:  Well developed, well nourished, in no acute distress. Neck:  Neck supple, no JVD. No masses, thyromegaly or abnormal cervical nodes. Lungs:  Clear bilaterally to auscultation and percussion.  Mildly decreased breath sounds bilaterally.  Heart:  Non-displaced PMI, chest non-tender; slightly irregular rate and rhythm, S1, S2, 2/6 early SEM.  No gallop. Right carotid bruit. No ankle edema, no varicosities.  Unable to palpate pedal pulses but feet warm.   Abdomen:  Bowel sounds positive; abdomen soft and non-tender without masses, organomegaly, or hernias noted. No hepatosplenomegaly. Extremities:  No clubbing or cyanosis. Neurologic:  Alert and oriented x 3.

## 2011-11-07 NOTE — Assessment & Plan Note (Signed)
Claudication seems stable to improved.  No foot ulcers or rest pain.  Would repeat ABIs next year.

## 2011-11-07 NOTE — Assessment & Plan Note (Signed)
Mild AS on recent echo.

## 2011-11-07 NOTE — Assessment & Plan Note (Signed)
Euvolemic on exam, NYHA class II symptoms.  EF somewhat improved on last echo to 45-50%.  She is tolerating bisoprolol better than Coreg.  I will not increase bisoprolol given history of mild bradycardia.  No ACEI or aldosterone antagonist due to renal insufficiency.  She can increase hydralazine to 50 mg tid today and continue Imdur 60 mg daily.

## 2011-11-07 NOTE — Assessment & Plan Note (Signed)
Severe vascular disease.  Really needs to quit.  We discussed this again today.  She is trying.

## 2011-11-07 NOTE — Assessment & Plan Note (Signed)
Stable, no chest pain.  Continue ASA, plavix, bisoprolol, statin.  

## 2011-11-12 ENCOUNTER — Encounter: Payer: Self-pay | Admitting: Cardiology

## 2011-11-18 DIAGNOSIS — I129 Hypertensive chronic kidney disease with stage 1 through stage 4 chronic kidney disease, or unspecified chronic kidney disease: Secondary | ICD-10-CM | POA: Diagnosis not present

## 2011-11-18 DIAGNOSIS — N184 Chronic kidney disease, stage 4 (severe): Secondary | ICD-10-CM | POA: Diagnosis not present

## 2011-11-18 DIAGNOSIS — N2581 Secondary hyperparathyroidism of renal origin: Secondary | ICD-10-CM | POA: Diagnosis not present

## 2011-11-18 DIAGNOSIS — I10 Essential (primary) hypertension: Secondary | ICD-10-CM | POA: Diagnosis not present

## 2011-11-26 ENCOUNTER — Telehealth: Payer: Self-pay | Admitting: Cardiology

## 2011-11-26 DIAGNOSIS — I2510 Atherosclerotic heart disease of native coronary artery without angina pectoris: Secondary | ICD-10-CM | POA: Diagnosis not present

## 2011-11-26 DIAGNOSIS — E1159 Type 2 diabetes mellitus with other circulatory complications: Secondary | ICD-10-CM | POA: Diagnosis not present

## 2011-11-26 DIAGNOSIS — I10 Essential (primary) hypertension: Secondary | ICD-10-CM | POA: Diagnosis not present

## 2011-11-26 DIAGNOSIS — N184 Chronic kidney disease, stage 4 (severe): Secondary | ICD-10-CM | POA: Diagnosis not present

## 2011-11-26 NOTE — Telephone Encounter (Signed)
Spoke with pt. Pt states she tried taking hydralazine 50mg  three times a day. She states she was SOB and "droopy" on this dose. She decreased the dose to 50mg  two times a day and feels better.

## 2011-11-26 NOTE — Telephone Encounter (Signed)
Spoke with pt. She is aware OK to continue hydralazine 50mg  bid.

## 2011-11-26 NOTE — Telephone Encounter (Signed)
Pt states she has been for 2 doctors appointments since decreasing to 50mg  bid and her BP has been fine. I will forward to Dr Shirlee Latch for review.

## 2011-11-26 NOTE — Telephone Encounter (Signed)
New msg °Pt wants to discuss her meds. Please call °

## 2011-11-26 NOTE — Telephone Encounter (Signed)
Ok, can keep at that dose.

## 2011-12-24 DIAGNOSIS — Z1231 Encounter for screening mammogram for malignant neoplasm of breast: Secondary | ICD-10-CM | POA: Diagnosis not present

## 2011-12-24 DIAGNOSIS — Z803 Family history of malignant neoplasm of breast: Secondary | ICD-10-CM | POA: Diagnosis not present

## 2011-12-26 DIAGNOSIS — Q828 Other specified congenital malformations of skin: Secondary | ICD-10-CM | POA: Diagnosis not present

## 2011-12-26 DIAGNOSIS — E1149 Type 2 diabetes mellitus with other diabetic neurological complication: Secondary | ICD-10-CM | POA: Diagnosis not present

## 2011-12-26 DIAGNOSIS — L608 Other nail disorders: Secondary | ICD-10-CM | POA: Diagnosis not present

## 2012-01-24 ENCOUNTER — Other Ambulatory Visit: Payer: Self-pay | Admitting: Cardiology

## 2012-02-13 DIAGNOSIS — I2510 Atherosclerotic heart disease of native coronary artery without angina pectoris: Secondary | ICD-10-CM | POA: Diagnosis not present

## 2012-02-13 DIAGNOSIS — N184 Chronic kidney disease, stage 4 (severe): Secondary | ICD-10-CM | POA: Diagnosis not present

## 2012-02-13 DIAGNOSIS — J438 Other emphysema: Secondary | ICD-10-CM | POA: Diagnosis not present

## 2012-02-13 DIAGNOSIS — E1159 Type 2 diabetes mellitus with other circulatory complications: Secondary | ICD-10-CM | POA: Diagnosis not present

## 2012-02-13 DIAGNOSIS — Z23 Encounter for immunization: Secondary | ICD-10-CM | POA: Diagnosis not present

## 2012-02-18 DIAGNOSIS — C679 Malignant neoplasm of bladder, unspecified: Secondary | ICD-10-CM | POA: Diagnosis not present

## 2012-03-19 DIAGNOSIS — E1149 Type 2 diabetes mellitus with other diabetic neurological complication: Secondary | ICD-10-CM | POA: Diagnosis not present

## 2012-03-19 DIAGNOSIS — L608 Other nail disorders: Secondary | ICD-10-CM | POA: Diagnosis not present

## 2012-03-19 DIAGNOSIS — Q828 Other specified congenital malformations of skin: Secondary | ICD-10-CM | POA: Diagnosis not present

## 2012-03-24 DIAGNOSIS — D649 Anemia, unspecified: Secondary | ICD-10-CM | POA: Diagnosis not present

## 2012-03-24 DIAGNOSIS — N184 Chronic kidney disease, stage 4 (severe): Secondary | ICD-10-CM | POA: Diagnosis not present

## 2012-03-24 DIAGNOSIS — N2581 Secondary hyperparathyroidism of renal origin: Secondary | ICD-10-CM | POA: Diagnosis not present

## 2012-03-30 DIAGNOSIS — N184 Chronic kidney disease, stage 4 (severe): Secondary | ICD-10-CM | POA: Diagnosis not present

## 2012-03-30 DIAGNOSIS — E1129 Type 2 diabetes mellitus with other diabetic kidney complication: Secondary | ICD-10-CM | POA: Diagnosis not present

## 2012-03-30 DIAGNOSIS — I10 Essential (primary) hypertension: Secondary | ICD-10-CM | POA: Diagnosis not present

## 2012-04-15 DIAGNOSIS — I2510 Atherosclerotic heart disease of native coronary artery without angina pectoris: Secondary | ICD-10-CM | POA: Diagnosis not present

## 2012-04-15 DIAGNOSIS — R05 Cough: Secondary | ICD-10-CM | POA: Diagnosis not present

## 2012-04-15 DIAGNOSIS — J438 Other emphysema: Secondary | ICD-10-CM | POA: Diagnosis not present

## 2012-04-15 DIAGNOSIS — R5383 Other fatigue: Secondary | ICD-10-CM | POA: Diagnosis not present

## 2012-04-18 ENCOUNTER — Other Ambulatory Visit: Payer: Self-pay | Admitting: Cardiology

## 2012-04-20 DIAGNOSIS — Z111 Encounter for screening for respiratory tuberculosis: Secondary | ICD-10-CM | POA: Diagnosis not present

## 2012-04-21 ENCOUNTER — Ambulatory Visit (INDEPENDENT_AMBULATORY_CARE_PROVIDER_SITE_OTHER): Payer: Medicare Other | Admitting: Ophthalmology

## 2012-04-22 DIAGNOSIS — M79609 Pain in unspecified limb: Secondary | ICD-10-CM | POA: Diagnosis not present

## 2012-04-22 DIAGNOSIS — R05 Cough: Secondary | ICD-10-CM | POA: Diagnosis not present

## 2012-04-22 DIAGNOSIS — J438 Other emphysema: Secondary | ICD-10-CM | POA: Diagnosis not present

## 2012-04-23 ENCOUNTER — Ambulatory Visit: Payer: Medicare Other | Admitting: Cardiology

## 2012-05-10 DIAGNOSIS — R5381 Other malaise: Secondary | ICD-10-CM | POA: Diagnosis not present

## 2012-05-10 DIAGNOSIS — R05 Cough: Secondary | ICD-10-CM | POA: Diagnosis not present

## 2012-05-10 DIAGNOSIS — R5383 Other fatigue: Secondary | ICD-10-CM | POA: Diagnosis not present

## 2012-05-10 DIAGNOSIS — E1159 Type 2 diabetes mellitus with other circulatory complications: Secondary | ICD-10-CM | POA: Diagnosis not present

## 2012-05-10 DIAGNOSIS — I2510 Atherosclerotic heart disease of native coronary artery without angina pectoris: Secondary | ICD-10-CM | POA: Diagnosis not present

## 2012-05-11 ENCOUNTER — Other Ambulatory Visit (HOSPITAL_COMMUNITY): Payer: Self-pay | Admitting: Internal Medicine

## 2012-05-11 DIAGNOSIS — R059 Cough, unspecified: Secondary | ICD-10-CM

## 2012-05-11 DIAGNOSIS — R05 Cough: Secondary | ICD-10-CM

## 2012-05-12 ENCOUNTER — Encounter (HOSPITAL_COMMUNITY): Payer: Self-pay

## 2012-05-12 ENCOUNTER — Ambulatory Visit (HOSPITAL_COMMUNITY)
Admission: RE | Admit: 2012-05-12 | Discharge: 2012-05-12 | Disposition: A | Discharge status: Home / Self Care | Payer: Medicare Other | Source: Ambulatory Visit | Attending: Internal Medicine | Admitting: Internal Medicine

## 2012-05-12 DIAGNOSIS — R0602 Shortness of breath: Secondary | ICD-10-CM | POA: Diagnosis not present

## 2012-05-12 DIAGNOSIS — R059 Cough, unspecified: Secondary | ICD-10-CM | POA: Diagnosis not present

## 2012-05-12 DIAGNOSIS — J449 Chronic obstructive pulmonary disease, unspecified: Secondary | ICD-10-CM | POA: Diagnosis not present

## 2012-05-12 DIAGNOSIS — R918 Other nonspecific abnormal finding of lung field: Secondary | ICD-10-CM | POA: Insufficient documentation

## 2012-05-12 DIAGNOSIS — R05 Cough: Secondary | ICD-10-CM | POA: Insufficient documentation

## 2012-05-20 ENCOUNTER — Ambulatory Visit (INDEPENDENT_AMBULATORY_CARE_PROVIDER_SITE_OTHER)
Admission: RE | Admit: 2012-05-20 | Discharge: 2012-05-20 | Disposition: A | Discharge status: Home / Self Care | Payer: Medicare Other | Source: Ambulatory Visit | Attending: Internal Medicine | Admitting: Internal Medicine

## 2012-05-20 ENCOUNTER — Ambulatory Visit (INDEPENDENT_AMBULATORY_CARE_PROVIDER_SITE_OTHER): Payer: Medicare Other | Admitting: Ophthalmology

## 2012-05-20 ENCOUNTER — Ambulatory Visit (INDEPENDENT_AMBULATORY_CARE_PROVIDER_SITE_OTHER): Payer: Medicare Other | Admitting: Internal Medicine

## 2012-05-20 ENCOUNTER — Encounter: Payer: Self-pay | Admitting: Internal Medicine

## 2012-05-20 VITALS — BP 138/78 | HR 51 | Temp 97.7°F | Ht 64.5 in | Wt 156.0 lb

## 2012-05-20 DIAGNOSIS — H4010X0 Unspecified open-angle glaucoma, stage unspecified: Secondary | ICD-10-CM

## 2012-05-20 DIAGNOSIS — F17200 Nicotine dependence, unspecified, uncomplicated: Secondary | ICD-10-CM | POA: Diagnosis not present

## 2012-05-20 DIAGNOSIS — R918 Other nonspecific abnormal finding of lung field: Secondary | ICD-10-CM | POA: Diagnosis not present

## 2012-05-20 DIAGNOSIS — J189 Pneumonia, unspecified organism: Secondary | ICD-10-CM | POA: Diagnosis not present

## 2012-05-20 DIAGNOSIS — E1139 Type 2 diabetes mellitus with other diabetic ophthalmic complication: Secondary | ICD-10-CM

## 2012-05-20 DIAGNOSIS — I10 Essential (primary) hypertension: Secondary | ICD-10-CM

## 2012-05-20 DIAGNOSIS — H35039 Hypertensive retinopathy, unspecified eye: Secondary | ICD-10-CM

## 2012-05-20 DIAGNOSIS — H43819 Vitreous degeneration, unspecified eye: Secondary | ICD-10-CM

## 2012-05-20 DIAGNOSIS — E11319 Type 2 diabetes mellitus with unspecified diabetic retinopathy without macular edema: Secondary | ICD-10-CM

## 2012-05-20 NOTE — Patient Instructions (Addendum)
Please remember to go to the  x-ray department downstairs for your tests - we will call you with the results when they are available.  Please schedule a follow up office visit in 4 weeks, sooner if needed

## 2012-05-20 NOTE — Assessment & Plan Note (Signed)
-   See CT Chest 05/12/12  Vs cxr 05/20/2012 better esp on lateral views - 05/20/2012  Walked RA x 2 laps @ 185 ft each stopped due to leg pain, sats ok  Unusual hx but apparently had a pneumonia that is slowly resolving radiographically in RUL - difficult to be sure about this because the only xrays we have in the system to compare are 2012 and the CT dated 05/12/12.  Atypical tb or ca need to be excluded here and the former definitely might have responded partially to FQ rx for CAP so it's not possible to exclude it here.  Discussed in detail all the  indications, usual  risks and alternatives  relative to the benefits with patient who agrees to proceed with conservative f/u with cxr in 4 weeks

## 2012-05-20 NOTE — Progress Notes (Signed)
  Subjective:    Patient ID: Erica Jimenez, female    DOB: 07-12-1929 MRN: [id number]  HPI  76 yobf active smoker last well in Nov 2013 around Tgiving with fatigue and some sinus problems and some coughing dx with pna and referred 05/20/2012 to pulmonary clinic for failure to resolve.   05/20/2012 1st pulmonary eval cc persistent fatigue beginning to improve,  With soaking sweats that resolved, appetite very poor and then came back, am cough min yellow never bloody, also improving gradually prior to OV .  No obvious daytime variabilty or assoc sob/ cp or chest tightness, subjective wheeze overt sinus or hb symptoms. No unusual exp hx or h/o childhood pna/ asthma or premature birth to her knowledge.     Sleeping ok without nocturnal  or early am exacerbation  of respiratory  c/o's or need for noct saba. Also denies any obvious fluctuation of symptoms with weather or environmental changes or other aggravating or alleviating factors except as outlined above   Review of Systems  Constitutional: Negative for fever, chills and unexpected weight change.  HENT: Positive for sneezing and postnasal drip. Negative for ear pain, nosebleeds, congestion, sore throat, rhinorrhea, trouble swallowing, dental problem, voice change and sinus pressure.   Eyes: Negative for visual disturbance.  Respiratory: Positive for cough. Negative for choking and shortness of breath.   Cardiovascular: Negative for chest pain and leg swelling.  Gastrointestinal: Negative for vomiting, abdominal pain and diarrhea.  Genitourinary: Negative for difficulty urinating.  Musculoskeletal: Negative for arthralgias.  Skin: Negative for rash.  Neurological: Negative for tremors, syncope and headaches.  Hematological: Does not bruise/bleed easily.       Objective:   Physical Exam  Wt Readings from Last 3 Encounters:  05/20/12 156 lb (70.761 kg)  11/05/11 165 lb (74.844 kg)  02/27/11 168 lb (76.204 kg)     HEENT mild turbinate  edema.  Oropharynx no thrush or excess pnd or cobblestoning.  No JVD or cervical adenopathy. Mild accessory muscle hypertrophy. Trachea midline, nl thryroid. Chest was hyperinflated by percussion with diminished breath sounds and moderate increased exp time without wheeze. Hoover sign positive at mid inspiration. Regular rate and rhythm with II-III/VI sys  Murmur, loud A2, no gallop or rub or increase P2 or edema.  Abd: no hsm, nl excursion. Ext warm without cyanosis or clubbing.    CXR  05/20/2012 :  Mild hyperinflation. Persistent patchy infiltrate in the right upper lobe. Follow-up to resolution is recommended.      Assessment & Plan:

## 2012-05-20 NOTE — Assessment & Plan Note (Signed)
>   3 min Strongly encourage to commit to quit.

## 2012-06-17 ENCOUNTER — Ambulatory Visit: Payer: Medicare Other | Admitting: Internal Medicine

## 2012-06-21 DIAGNOSIS — E1149 Type 2 diabetes mellitus with other diabetic neurological complication: Secondary | ICD-10-CM | POA: Diagnosis not present

## 2012-06-21 DIAGNOSIS — Q828 Other specified congenital malformations of skin: Secondary | ICD-10-CM | POA: Diagnosis not present

## 2012-06-21 DIAGNOSIS — L608 Other nail disorders: Secondary | ICD-10-CM | POA: Diagnosis not present

## 2012-06-28 DIAGNOSIS — I739 Peripheral vascular disease, unspecified: Secondary | ICD-10-CM | POA: Diagnosis not present

## 2012-06-28 DIAGNOSIS — J438 Other emphysema: Secondary | ICD-10-CM | POA: Diagnosis not present

## 2012-06-28 DIAGNOSIS — E1159 Type 2 diabetes mellitus with other circulatory complications: Secondary | ICD-10-CM | POA: Diagnosis not present

## 2012-06-30 ENCOUNTER — Telehealth: Payer: Self-pay | Admitting: Internal Medicine

## 2012-06-30 NOTE — Telephone Encounter (Signed)
Called, spoke with pt.  Pt states our # showed up on her called id today but no msg was left.  I do not see that we have tried to contact her regarding labs, xray, etc.  Verlon Au, did you or do you know if Dr. Sherene Sires tried calling pt today?

## 2012-06-30 NOTE — Telephone Encounter (Signed)
I did not call her, Did you Dr. Sherene Sires?

## 2012-07-01 NOTE — Telephone Encounter (Signed)
LMTCB

## 2012-07-01 NOTE — Telephone Encounter (Signed)
No I did not.  

## 2012-07-02 NOTE — Telephone Encounter (Signed)
Pt advised. Tawni Melkonian, CMA  

## 2012-07-11 ENCOUNTER — Other Ambulatory Visit: Payer: Self-pay | Admitting: Cardiology

## 2012-07-14 ENCOUNTER — Encounter: Payer: Self-pay | Admitting: Internal Medicine

## 2012-07-14 ENCOUNTER — Ambulatory Visit (INDEPENDENT_AMBULATORY_CARE_PROVIDER_SITE_OTHER)
Admission: RE | Admit: 2012-07-14 | Discharge: 2012-07-14 | Disposition: A | Discharge status: Home / Self Care | Payer: Medicare Other | Source: Ambulatory Visit | Attending: Internal Medicine | Admitting: Internal Medicine

## 2012-07-14 ENCOUNTER — Ambulatory Visit (INDEPENDENT_AMBULATORY_CARE_PROVIDER_SITE_OTHER): Payer: Medicare Other | Admitting: Internal Medicine

## 2012-07-14 VITALS — BP 128/52 | HR 57 | Temp 98.0°F | Ht 64.5 in | Wt 158.2 lb

## 2012-07-14 DIAGNOSIS — F17200 Nicotine dependence, unspecified, uncomplicated: Secondary | ICD-10-CM | POA: Diagnosis not present

## 2012-07-14 DIAGNOSIS — R918 Other nonspecific abnormal finding of lung field: Secondary | ICD-10-CM

## 2012-07-14 DIAGNOSIS — J984 Other disorders of lung: Secondary | ICD-10-CM | POA: Diagnosis not present

## 2012-07-14 DIAGNOSIS — Z09 Encounter for follow-up examination after completed treatment for conditions other than malignant neoplasm: Secondary | ICD-10-CM | POA: Diagnosis not present

## 2012-07-14 NOTE — Patient Instructions (Addendum)
Please remember to go to the x-ray department downstairs for your tests - we will call you with the results when they are available.  The key is to stop smoking completely before smoking completely stops you - this is the most important aspect of your care   If you are satisfied with your treatment plan let your doctor know and he/she can either refill your medications or you can return here when your prescription runs out.     If in any way you are not 100% satisfied,  please tell us.  If 100% better, tell your friends!

## 2012-07-14 NOTE — Progress Notes (Signed)
  Subjective:    Patient ID: Erica Jimenez, female    DOB: [birth year] MRN: [id number]    Brief patient profile:  3 yobf active smoker last well in Nov 2013 around Tgiving with fatigue and some sinus problems and some coughing dx with pna and referred 05/20/2012 to pulmonary clinic for failure to resolve.  HPI 05/20/2012 1st pulmonary eval cc persistent fatigue beginning to improve,  With soaking sweats that resolved, appetite very poor and then came back, am cough min yellow never bloody, also improving gradually prior to OV  rec reccheck cxr in 6 weeks  07/14/2012  6 week f/u ov/Wert cc   No cough, good appetitie, still smoking   No obvious daytime variabilty or assoc sob/ cp or chest tightness, subjective wheeze overt sinus or hb symptoms. No unusual exp hx or h/o childhood pna/ asthma or premature birth to her knowledge.     Sleeping ok without nocturnal  or early am exacerbation  of respiratory  c/o's or need for noct saba. Also denies any obvious fluctuation of symptoms with weather or environmental changes or other aggravating or alleviating factors except as outlined above         Objective:   Physical Exam  07/14/2012   158  Wt Readings from Last 3 Encounters:  05/20/12 156 lb (70.761 kg)  11/05/11 165 lb (74.844 kg)  02/27/11 168 lb (76.204 kg)     HEENT mild turbinate edema.  Oropharynx no thrush or excess pnd or cobblestoning.  No JVD or cervical adenopathy. Mild accessory muscle hypertrophy. Trachea midline, nl thryroid. Chest was hyperinflated by percussion with diminished breath sounds and moderate increased exp time without wheeze. Hoover sign positive at mid inspiration. Regular rate and rhythm with II-III/VI sys  Murmur, loud A2, no gallop or rub or increase P2 or edema.  Abd: no hsm, nl excursion. Ext warm without cyanosis or clubbing.    CXR  07/14/2012 :   1. Cardiac enlargement without heart failure. 2. Improved appearance of right upper lobe pneumonia.        Assessment & Plan:

## 2012-07-14 NOTE — Progress Notes (Signed)
Quick Note:  Spoke with pt and notified of results per Dr. Wert. Pt verbalized understanding and denied any questions.  ______ 

## 2012-07-17 NOTE — Assessment & Plan Note (Signed)
-   See CT Chest 05/12/12  - 05/20/2012  Walked RA x 2 laps @ 185 ft each stopped due to leg pain, sats ok -312/14 marked serial improvement on cxr > repeat 6 weeks rec  She appears to be recovering nicely though still concerned about the active smoking.  No evidence of ca or m tb at this point, though atypical tb can partially respond to FQ and a final cxr is needed here.

## 2012-07-17 NOTE — Assessment & Plan Note (Signed)

## 2012-07-27 DIAGNOSIS — I10 Essential (primary) hypertension: Secondary | ICD-10-CM | POA: Diagnosis not present

## 2012-07-27 DIAGNOSIS — N184 Chronic kidney disease, stage 4 (severe): Secondary | ICD-10-CM | POA: Diagnosis not present

## 2012-07-27 DIAGNOSIS — I129 Hypertensive chronic kidney disease with stage 1 through stage 4 chronic kidney disease, or unspecified chronic kidney disease: Secondary | ICD-10-CM | POA: Diagnosis not present

## 2012-07-27 DIAGNOSIS — N2581 Secondary hyperparathyroidism of renal origin: Secondary | ICD-10-CM | POA: Diagnosis not present

## 2012-08-11 DIAGNOSIS — I10 Essential (primary) hypertension: Secondary | ICD-10-CM | POA: Diagnosis not present

## 2012-08-11 DIAGNOSIS — R05 Cough: Secondary | ICD-10-CM | POA: Diagnosis not present

## 2012-08-11 DIAGNOSIS — E1159 Type 2 diabetes mellitus with other circulatory complications: Secondary | ICD-10-CM | POA: Diagnosis not present

## 2012-08-11 DIAGNOSIS — E1129 Type 2 diabetes mellitus with other diabetic kidney complication: Secondary | ICD-10-CM | POA: Diagnosis not present

## 2012-08-11 DIAGNOSIS — N184 Chronic kidney disease, stage 4 (severe): Secondary | ICD-10-CM | POA: Diagnosis not present

## 2012-08-11 DIAGNOSIS — F17200 Nicotine dependence, unspecified, uncomplicated: Secondary | ICD-10-CM | POA: Diagnosis not present

## 2012-08-16 DIAGNOSIS — H5200 Hypermetropia, unspecified eye: Secondary | ICD-10-CM | POA: Diagnosis not present

## 2012-08-16 DIAGNOSIS — H524 Presbyopia: Secondary | ICD-10-CM | POA: Diagnosis not present

## 2012-08-16 DIAGNOSIS — H52229 Regular astigmatism, unspecified eye: Secondary | ICD-10-CM | POA: Diagnosis not present

## 2012-08-16 DIAGNOSIS — H40009 Preglaucoma, unspecified, unspecified eye: Secondary | ICD-10-CM | POA: Diagnosis not present

## 2012-08-25 ENCOUNTER — Ambulatory Visit: Payer: Medicare Other | Admitting: Internal Medicine

## 2012-08-26 ENCOUNTER — Ambulatory Visit (INDEPENDENT_AMBULATORY_CARE_PROVIDER_SITE_OTHER): Payer: Medicare Other | Admitting: Internal Medicine

## 2012-08-26 ENCOUNTER — Ambulatory Visit (INDEPENDENT_AMBULATORY_CARE_PROVIDER_SITE_OTHER)
Admission: RE | Admit: 2012-08-26 | Discharge: 2012-08-26 | Disposition: A | Discharge status: Home / Self Care | Payer: Medicare Other | Source: Ambulatory Visit | Attending: Internal Medicine | Admitting: Internal Medicine

## 2012-08-26 ENCOUNTER — Encounter: Payer: Self-pay | Admitting: Internal Medicine

## 2012-08-26 VITALS — BP 130/60 | HR 50 | Temp 98.8°F | Ht 64.5 in | Wt 162.2 lb

## 2012-08-26 DIAGNOSIS — F17200 Nicotine dependence, unspecified, uncomplicated: Secondary | ICD-10-CM

## 2012-08-26 DIAGNOSIS — J189 Pneumonia, unspecified organism: Secondary | ICD-10-CM | POA: Diagnosis not present

## 2012-08-26 DIAGNOSIS — R918 Other nonspecific abnormal finding of lung field: Secondary | ICD-10-CM

## 2012-08-26 NOTE — Progress Notes (Signed)
  Subjective:    Patient ID: Erica Jimenez, female    DOB: 05/17/1929 MRN: [id number]    Brief patient profile:  51 yobf active smoker last well in Nov 2013 around Tgiving with fatigue and some sinus problems and some coughing dx with pna and referred 05/20/2012 to pulmonary clinic for failure to resolve.  HPI 05/20/2012 1st pulmonary eval cc persistent fatigue beginning to improve,  With soaking sweats that resolved, appetite very poor and then came back, am cough min yellow never bloody, also improving gradually prior to OV  rec reccheck cxr in 6 weeks  07/14/2012  6 week f/u ov/Erica Jimenez cc   No cough, good appetitie, still smoking Rec No cigs  08/26/2012 f/u ov/Erica Jimenez re r apical density, still smokes Chief Complaint  Patient presents with  . Follow-up    Pt c/o occ cough, mainly first thing in the am with light yellow sputum. CXR done today.    smoking p bfast with coffee, no limiting sob  No obvious daytime variabilty or assoc cp or chest tightness, subjective wheeze overt sinus or hb symptoms. No unusual exp hx or h/o childhood pna/ asthma or premature birth to her knowledge.     Sleeping ok without nocturnal  or early am exacerbation  of respiratory  c/o's or need for noct saba. Also denies any obvious fluctuation of symptoms with weather or environmental changes or other aggravating or alleviating factors except as outlined above   Current Medications, Allergies, Past Medical History, Past Surgical History, Family History, and Social History were reviewed in [employer] record.  ROS  The following are not active complaints unless bolded sore throat, dysphagia, dental problems, itching, sneezing,  nasal congestion or excess/ purulent secretions, ear ache,   fever, chills, sweats, unintended wt loss, pleuritic or exertional cp, hemoptysis,  orthopnea pnd or leg swelling, presyncope, palpitations, heartburn, abdominal pain, anorexia, nausea, vomiting, diarrhea  or  change in bowel or urinary habits, change in stools or urine, dysuria,hematuria,  rash, arthralgias, visual complaints, headache, numbness weakness or ataxia or problems with walking or coordination,  change in mood/affect or memory.            Objective:   Physical Exam  07/14/2012   158  > 08/26/2012 162  Wt Readings from Last 3 Encounters:  05/20/12 156 lb (70.761 kg)  11/05/11 165 lb (74.844 kg)  02/27/11 168 lb (76.204 kg)     HEENT mild turbinate edema.  Oropharynx no thrush or excess pnd or cobblestoning.  No JVD or cervical adenopathy. Mild accessory muscle hypertrophy. Trachea midline, nl thryroid. Chest was hyperinflated by percussion with diminished breath sounds and moderate increased exp time without wheeze. Hoover sign positive at mid inspiration. Regular rate and rhythm with II-III/VI sys  Murmur, loud A2, no gallop or rub or increase P2 or edema.  Abd: no hsm, nl excursion. Ext warm without cyanosis or clubbing.    CXR  08/26/2012 :  COPD/chronic changes.  No acute cardiopulmonary disease.        Assessment & Plan:

## 2012-08-26 NOTE — Assessment & Plan Note (Signed)
-   See CT Chest 05/12/12  - 05/20/2012  Walked RA x 2 laps @ 185 ft each stopped due to leg pain, sats ok -07/14/12 marked serial improvement on cxr > clear 08/26/2012 > no further xrays rec

## 2012-08-26 NOTE — Assessment & Plan Note (Signed)
>   I  Reviewed  the Flethcher curve with patient that basically indicates  if you quit smoking when your best day FEV1 is still well preserved (which his appears to be) it is highly unlikely you will progress to severe disease and informed the patient there was no medication on the market that has proven to change the curve or the likelihood of progression.  Therefore stopping smoking and maintaining abstinence is the most important aspect of care, not choice of inhalers or for that matter, doctors.    I recommended she start the commitment to quit by separating her am coffee and cigarette and seek Dr Silvano Rusk help if she can't quit on her own.

## 2012-08-26 NOTE — Patient Instructions (Addendum)
The key is to stop smoking completely before smoking completely stops you!    If you are satisfied with your treatment plan let your doctor know and he/she can either refill your medications or you can return here when your prescription runs out.     If in any way you are not 100% satisfied,  please tell us.  If 100% better, tell your friends!  

## 2012-08-27 NOTE — Progress Notes (Signed)
Quick Note:  Spoke with patient, made her aware of results/recs as listed below per Dr. Sherene Sires Verbalized understanding and nothing further needed at this time ______

## 2012-08-30 DIAGNOSIS — H409 Unspecified glaucoma: Secondary | ICD-10-CM | POA: Diagnosis not present

## 2012-08-30 DIAGNOSIS — Z961 Presence of intraocular lens: Secondary | ICD-10-CM | POA: Diagnosis not present

## 2012-08-30 DIAGNOSIS — H04129 Dry eye syndrome of unspecified lacrimal gland: Secondary | ICD-10-CM | POA: Diagnosis not present

## 2012-08-30 DIAGNOSIS — H4011X0 Primary open-angle glaucoma, stage unspecified: Secondary | ICD-10-CM | POA: Diagnosis not present

## 2012-09-02 ENCOUNTER — Telehealth: Payer: Self-pay | Admitting: Cardiology

## 2012-09-02 DIAGNOSIS — I2510 Atherosclerotic heart disease of native coronary artery without angina pectoris: Secondary | ICD-10-CM

## 2012-09-02 MED ORDER — HYDRALAZINE HCL 50 MG PO TABS: 50.0000 mg | ORAL_TABLET | Freq: Two times a day (BID) | ORAL | Status: DC

## 2012-09-02 NOTE — Telephone Encounter (Signed)
Requested refill for Hydralazine. Completed request 09/02/12@2 :30pm

## 2012-09-02 NOTE — Telephone Encounter (Signed)
New problem   Pt need new prescription for Hydralazine 50mg  and want prescription to read( take one pill twice a day) Please call pt

## 2012-09-02 NOTE — Telephone Encounter (Signed)
Patient

## 2012-10-05 DIAGNOSIS — E1149 Type 2 diabetes mellitus with other diabetic neurological complication: Secondary | ICD-10-CM | POA: Diagnosis not present

## 2012-10-05 DIAGNOSIS — Q828 Other specified congenital malformations of skin: Secondary | ICD-10-CM | POA: Diagnosis not present

## 2012-10-05 DIAGNOSIS — L608 Other nail disorders: Secondary | ICD-10-CM | POA: Diagnosis not present

## 2012-10-11 DIAGNOSIS — N184 Chronic kidney disease, stage 4 (severe): Secondary | ICD-10-CM | POA: Diagnosis not present

## 2012-10-11 DIAGNOSIS — E1159 Type 2 diabetes mellitus with other circulatory complications: Secondary | ICD-10-CM | POA: Diagnosis not present

## 2012-10-11 DIAGNOSIS — R002 Palpitations: Secondary | ICD-10-CM | POA: Diagnosis not present

## 2012-10-11 DIAGNOSIS — J438 Other emphysema: Secondary | ICD-10-CM | POA: Diagnosis not present

## 2012-10-11 DIAGNOSIS — I10 Essential (primary) hypertension: Secondary | ICD-10-CM | POA: Diagnosis not present

## 2012-10-11 DIAGNOSIS — Z6828 Body mass index (BMI) 28.0-28.9, adult: Secondary | ICD-10-CM | POA: Diagnosis not present

## 2012-10-11 DIAGNOSIS — I739 Peripheral vascular disease, unspecified: Secondary | ICD-10-CM | POA: Diagnosis not present

## 2012-10-24 DIAGNOSIS — Z7902 Long term (current) use of antithrombotics/antiplatelets: Secondary | ICD-10-CM | POA: Diagnosis not present

## 2012-11-17 ENCOUNTER — Ambulatory Visit (INDEPENDENT_AMBULATORY_CARE_PROVIDER_SITE_OTHER): Payer: Medicare Other | Admitting: Ophthalmology

## 2012-11-18 ENCOUNTER — Ambulatory Visit (INDEPENDENT_AMBULATORY_CARE_PROVIDER_SITE_OTHER): Payer: Medicare Other | Admitting: Ophthalmology

## 2012-11-18 DIAGNOSIS — H35039 Hypertensive retinopathy, unspecified eye: Secondary | ICD-10-CM | POA: Diagnosis not present

## 2012-11-18 DIAGNOSIS — I10 Essential (primary) hypertension: Secondary | ICD-10-CM | POA: Diagnosis not present

## 2012-11-18 DIAGNOSIS — H43819 Vitreous degeneration, unspecified eye: Secondary | ICD-10-CM

## 2012-11-18 DIAGNOSIS — E1165 Type 2 diabetes mellitus with hyperglycemia: Secondary | ICD-10-CM

## 2012-11-18 DIAGNOSIS — E1139 Type 2 diabetes mellitus with other diabetic ophthalmic complication: Secondary | ICD-10-CM | POA: Diagnosis not present

## 2012-11-18 DIAGNOSIS — E11319 Type 2 diabetes mellitus with unspecified diabetic retinopathy without macular edema: Secondary | ICD-10-CM

## 2012-11-22 DIAGNOSIS — N2581 Secondary hyperparathyroidism of renal origin: Secondary | ICD-10-CM | POA: Diagnosis not present

## 2012-11-22 DIAGNOSIS — N184 Chronic kidney disease, stage 4 (severe): Secondary | ICD-10-CM | POA: Diagnosis not present

## 2012-11-22 DIAGNOSIS — D649 Anemia, unspecified: Secondary | ICD-10-CM | POA: Diagnosis not present

## 2012-12-01 DIAGNOSIS — H4011X0 Primary open-angle glaucoma, stage unspecified: Secondary | ICD-10-CM | POA: Diagnosis not present

## 2012-12-01 DIAGNOSIS — H11159 Pinguecula, unspecified eye: Secondary | ICD-10-CM | POA: Diagnosis not present

## 2012-12-01 DIAGNOSIS — H35039 Hypertensive retinopathy, unspecified eye: Secondary | ICD-10-CM | POA: Diagnosis not present

## 2012-12-01 DIAGNOSIS — E119 Type 2 diabetes mellitus without complications: Secondary | ICD-10-CM | POA: Diagnosis not present

## 2012-12-14 DIAGNOSIS — F17200 Nicotine dependence, unspecified, uncomplicated: Secondary | ICD-10-CM | POA: Diagnosis not present

## 2012-12-14 DIAGNOSIS — Z6828 Body mass index (BMI) 28.0-28.9, adult: Secondary | ICD-10-CM | POA: Diagnosis not present

## 2012-12-14 DIAGNOSIS — I10 Essential (primary) hypertension: Secondary | ICD-10-CM | POA: Diagnosis not present

## 2012-12-14 DIAGNOSIS — E1159 Type 2 diabetes mellitus with other circulatory complications: Secondary | ICD-10-CM | POA: Diagnosis not present

## 2012-12-24 DIAGNOSIS — Z1231 Encounter for screening mammogram for malignant neoplasm of breast: Secondary | ICD-10-CM | POA: Diagnosis not present

## 2012-12-24 DIAGNOSIS — Z803 Family history of malignant neoplasm of breast: Secondary | ICD-10-CM | POA: Diagnosis not present

## 2012-12-25 ENCOUNTER — Other Ambulatory Visit: Payer: Self-pay | Admitting: Cardiology

## 2012-12-29 ENCOUNTER — Other Ambulatory Visit: Payer: Self-pay | Admitting: Cardiology

## 2013-01-06 DIAGNOSIS — Z1331 Encounter for screening for depression: Secondary | ICD-10-CM | POA: Diagnosis not present

## 2013-01-06 DIAGNOSIS — E1129 Type 2 diabetes mellitus with other diabetic kidney complication: Secondary | ICD-10-CM | POA: Diagnosis not present

## 2013-01-06 DIAGNOSIS — Z6828 Body mass index (BMI) 28.0-28.9, adult: Secondary | ICD-10-CM | POA: Diagnosis not present

## 2013-01-06 DIAGNOSIS — N184 Chronic kidney disease, stage 4 (severe): Secondary | ICD-10-CM | POA: Diagnosis not present

## 2013-01-06 DIAGNOSIS — J438 Other emphysema: Secondary | ICD-10-CM | POA: Diagnosis not present

## 2013-01-06 DIAGNOSIS — I10 Essential (primary) hypertension: Secondary | ICD-10-CM | POA: Diagnosis not present

## 2013-01-11 DIAGNOSIS — L608 Other nail disorders: Secondary | ICD-10-CM | POA: Diagnosis not present

## 2013-01-11 DIAGNOSIS — Q828 Other specified congenital malformations of skin: Secondary | ICD-10-CM | POA: Diagnosis not present

## 2013-01-11 DIAGNOSIS — E1149 Type 2 diabetes mellitus with other diabetic neurological complication: Secondary | ICD-10-CM | POA: Diagnosis not present

## 2013-02-06 ENCOUNTER — Other Ambulatory Visit: Payer: Self-pay | Admitting: Cardiology

## 2013-03-08 DIAGNOSIS — H4011X0 Primary open-angle glaucoma, stage unspecified: Secondary | ICD-10-CM | POA: Diagnosis not present

## 2013-03-08 DIAGNOSIS — H18419 Arcus senilis, unspecified eye: Secondary | ICD-10-CM | POA: Diagnosis not present

## 2013-03-08 DIAGNOSIS — H04129 Dry eye syndrome of unspecified lacrimal gland: Secondary | ICD-10-CM | POA: Diagnosis not present

## 2013-03-08 DIAGNOSIS — H11159 Pinguecula, unspecified eye: Secondary | ICD-10-CM | POA: Diagnosis not present

## 2013-03-16 ENCOUNTER — Other Ambulatory Visit: Payer: Self-pay | Admitting: Cardiology

## 2013-04-06 ENCOUNTER — Emergency Department (INDEPENDENT_AMBULATORY_CARE_PROVIDER_SITE_OTHER)
Admission: EM | Admit: 2013-04-06 | Discharge: 2013-04-06 | Disposition: A | Discharge status: Home / Self Care | Payer: Medicare Other | Source: Home / Self Care | Attending: Emergency Medicine | Admitting: Emergency Medicine

## 2013-04-06 ENCOUNTER — Encounter (HOSPITAL_COMMUNITY): Payer: Self-pay | Admitting: Emergency Medicine

## 2013-04-06 DIAGNOSIS — M109 Gout, unspecified: Secondary | ICD-10-CM | POA: Diagnosis not present

## 2013-04-06 MED ORDER — COLCHICINE 0.6 MG PO TABS: ORAL_TABLET | ORAL | Status: DC

## 2013-04-06 NOTE — ED Notes (Signed)
Pt c/o left foot/greater toe pain onset 11/23 Sxs include: swelling, pain that's gradually getting worse Denies: inj/trauma, f/v/n/d Hx of gout Alert w/no signs of acute distress.

## 2013-04-06 NOTE — ED Provider Notes (Signed)
CSN: [id number]     Arrival date & time 04/06/13  1217 History   First MD Initiated Contact with Patient 04/06/13 1401     Chief Complaint  Patient presents with  . Foot Pain   (Consider location/radiation/quality/duration/timing/severity/associated sxs/prior Treatment) Patient is a 79 y.o. female presenting with lower extremity pain. The history is provided by the patient.  Foot Pain This is a recurrent (Hx of gout/podagra) problem. Episode onset: 8 days ago. No reported injury. The problem occurs constantly. The problem has been gradually worsening.    Past Medical History  Diagnosis Date  . DM (diabetes mellitus)   . Hyperlipidemia   . CAD (coronary artery disease)      Pt is s/p anterior MI in 1996 with stent placed in LAD.  Last myoview in our office was in 7/05 and  showed apical anterior, apical, and apical inferior infarct.  Minimal peri-infarct ischemia.  EF 49%.   . Hypothyroidism   . HTN (hypertension)   . CVA (cerebral vascular accident)   . Bladder cancer      s/p resection and BCG treatment.   Marland Kitchen PAD (peripheral artery disease)      arterial doppler study 12/04 suggestive of > 50% bilateral SFA stenosis.  Pt has mild claudication.  No invasive evaluation given CKD and desire to avoid contrast use.   . CKD (chronic kidney disease)      Pt is stage III-IV.  She had a fistula placed but is not on dialysis.  She follows with Dr. Eliott Nine for  nephrology.   . Gout   . Aortic stenosis   . History of glaucoma   . COPD (chronic obstructive pulmonary disease)    Past Surgical History  Procedure Laterality Date  . Cystostomy w/ bladder biopsy    . Angioplasty     Family History  Problem Relation Age of Onset  . Heart disease Maternal Aunt   . Breast cancer Mother    History  Substance Use Topics  . Smoking status: Current Every Day Smoker -- 0.30 packs/day for 60 years    Types: Cigarettes  . Smokeless tobacco: Never Used  . Alcohol Use: No   OB History   Grav Para  Term Preterm Abortions TAB SAB Ect Mult Living                 Review of Systems  All other systems reviewed and are negative.    Allergies  Sulfonamide derivatives  Home Medications   Current Outpatient Rx  Name  Route  Sig  Dispense  Refill  . amLODipine (NORVASC) 2.5 MG tablet      take 1 tablet by mouth once daily   30 tablet   0     PATIENT NEEDS TO CALL OUR OFFICE TO SCHEDULE A FOL ...   . aspirin 81 MG tablet   Oral   Take 81 mg by mouth daily.           Marland Kitchen atorvastatin (LIPITOR) 80 MG tablet   Oral   Take 80 mg by mouth daily.           . bisoprolol (ZEBETA) 5 MG tablet      take 1/2 tablet by mouth once daily   45 tablet   2   . brinzolamide (AZOPT) 1 % ophthalmic suspension   Both Eyes   Place 1 drop into both eyes 3 (three) times daily.          . calcitRIOL (ROCALTROL) 0.25  MCG capsule   Oral   Take 0.25 mcg by mouth 3 (three) times a week.         . clopidogrel (PLAVIX) 75 MG tablet   Oral   Take 75 mg by mouth daily.           . DHA-EPA-Flaxseed Oil-Vitamin E (THERA TEARS NUTRITION PO)   Oral   Take by mouth as needed.         . Ferrous Gluconate (FERGON PO)   Oral   Take 1 tablet by mouth daily.          Marland Kitchen FOLIC ACID PO      2.2 mg. 1 tab po qd         . furosemide (LASIX) 40 MG tablet   Oral   Take 40 mg by mouth daily as needed.          . hydrALAZINE (APRESOLINE) 50 MG tablet      take 1 tablet by mouth twice a day   60 tablet   3   . insulin aspart (NOVOLOG) 100 UNIT/ML injection   Subcutaneous   Inject into the skin 3 (three) times daily before meals. 6 units breakfast, 5 units lunch, 5 units suppper          . isosorbide mononitrate (IMDUR) 60 MG 24 hr tablet   Oral   Take 60 mg by mouth daily.           Marland Kitchen levothyroxine (SYNTHROID, LEVOTHROID) 50 MCG tablet   Oral   Take 50 mcg by mouth daily.           . nitroGLYCERIN (NITROSTAT) 0.4 MG SL tablet   Sublingual   Place 1 tablet (0.4 mg  total) under the tongue every 5 (five) minutes as needed. Up to 3 doses   25 tablet   3    BP 135/50  Pulse 55  Temp(Src) 98 F (36.7 C) (Oral)  Resp 17  SpO2 100% Physical Exam  Nursing note and vitals reviewed. Constitutional: She is oriented to person, place, and time. She appears well-developed and well-nourished. No distress.  HENT:  Head: Normocephalic and atraumatic.  Cardiovascular: Regular rhythm.   Pulmonary/Chest: Effort normal and breath sounds normal.  Abdominal: There is no tenderness.  Musculoskeletal:       Feet:  Neurological: She is alert and oriented to person, place, and time.  Skin: Skin is warm and dry. There is erythema.  Psychiatric: She has a normal mood and affect. Her behavior is normal.    ED Course  Procedures (including critical care time) Labs Review Labs Reviewed - No data to display Imaging Review No results found.  EKG Interpretation    Date/Time:    Ventricular Rate:    PR Interval:    QRS Duration:   QT Interval:    QTC Calculation:   R Axis:     Text Interpretation:              MDM  Exam consistent with podagra.     Jess Barters [place], [place] 04/06/13 [phone]

## 2013-04-06 NOTE — ED Provider Notes (Signed)
Medical screening examination/treatment/procedure(s) were performed by non-physician practitioner and as supervising physician I was immediately available for consultation/collaboration.  Leslee Home, M.D.  Reuben Likes, MD 04/06/13 [phone]

## 2013-04-08 DIAGNOSIS — N184 Chronic kidney disease, stage 4 (severe): Secondary | ICD-10-CM | POA: Diagnosis not present

## 2013-04-08 DIAGNOSIS — J438 Other emphysema: Secondary | ICD-10-CM | POA: Diagnosis not present

## 2013-04-08 DIAGNOSIS — M109 Gout, unspecified: Secondary | ICD-10-CM | POA: Diagnosis not present

## 2013-04-12 ENCOUNTER — Ambulatory Visit (INDEPENDENT_AMBULATORY_CARE_PROVIDER_SITE_OTHER): Payer: Medicare Other

## 2013-04-12 VITALS — BP 134/52 | HR 57 | Resp 24 | Ht 64.5 in | Wt 163.0 lb

## 2013-04-12 DIAGNOSIS — L608 Other nail disorders: Secondary | ICD-10-CM | POA: Diagnosis not present

## 2013-04-12 DIAGNOSIS — E1149 Type 2 diabetes mellitus with other diabetic neurological complication: Secondary | ICD-10-CM | POA: Diagnosis not present

## 2013-04-12 DIAGNOSIS — I739 Peripheral vascular disease, unspecified: Secondary | ICD-10-CM

## 2013-04-12 DIAGNOSIS — E1142 Type 2 diabetes mellitus with diabetic polyneuropathy: Secondary | ICD-10-CM

## 2013-04-12 DIAGNOSIS — E1140 Type 2 diabetes mellitus with diabetic neuropathy, unspecified: Secondary | ICD-10-CM

## 2013-04-12 DIAGNOSIS — Q828 Other specified congenital malformations of skin: Secondary | ICD-10-CM

## 2013-04-12 NOTE — Progress Notes (Signed)
   Subjective:    Patient ID: Erica Jimenez, female    DOB: 02-17-1930, 79 y.o.   MRN: [id number]  "Do the same old same old.  I have Gout in my left foot.  I saw Dr. Jarold Motto."  HPI    Review of Systems many findings or changes except patient is currently having a gout attack affecting her left first MTP joint. Is being seen by Dr. Jarold Motto in urgent center currently taking colchicine and prednisone for gout. Patient is also advised to stay well hydrated at this time.     Objective:   Physical Exam Neurovascular status is intact as follows DP pulse +2/4 PT thready one over 4 bilateral. Capillary fill time 3 seconds all digits. Neurologically epicritic and proprioceptive sensations intact although diminished on Semmes Weinstein testing patient has severe hyperesthesia of both feet due to diabetic neuropathy however the left foot first MTP is exquisitely painful tender on palpation and attempted range of motion to the gout flareup. Is also slightly warm to touch compared to the rest of the foot. Neurologically skin color pigment normal hair growth absent nails thick brittle friable in extremely incurvated and criptotic painful tender and debridement and palpation and with enclosed shoe wear. Patient also is digital contractures hammertoe deformities with hammertoe fifth digits bilateral as well as keratoses HD 5 bilateral.       Assessment & Plan:  Diabetes with peripheral neuropathy and other complications including current gout attack. Plan at this time is debridement of dystrophic friable mycotic nails x10. Also debridement multiple keratoses HD 5 bilateral. Followup with gout treatment protocols per primary physician in urgent center continue with colchicine. Suggested staying hydrated keeping the foot warm. Followup in 3 months for continued palliative care in the future as needed  Alvan Dame DPM

## 2013-04-12 NOTE — Patient Instructions (Signed)
Diabetes and Foot Care Diabetes may cause you to have problems because of poor blood supply (circulation) to your feet and legs. This may cause the skin on your feet to become thinner, break easier, and heal more slowly. Your skin may become dry, and the skin may peel and crack. You may also have nerve damage in your legs and feet causing decreased feeling in them. You may not notice minor injuries to your feet that could lead to infections or more serious problems. Taking care of your feet is one of the most important things you can do for yourself.  HOME CARE INSTRUCTIONS  Wear shoes at all times, even in the house. Do not go barefoot. Bare feet are easily injured.  Check your feet daily for blisters, cuts, and redness. If you cannot see the bottom of your feet, use a mirror or ask someone for help.  Wash your feet with warm water (do not use hot water) and mild soap. Then pat your feet and the areas between your toes until they are completely dry. Do not soak your feet as this can dry your skin.  Apply a moisturizing lotion or petroleum jelly (that does not contain alcohol and is unscented) to the skin on your feet and to dry, brittle toenails. Do not apply lotion between your toes.  Trim your toenails straight across. Do not dig under them or around the cuticle. File the edges of your nails with an emery board or nail file.  Do not cut corns or calluses or try to remove them with medicine.  Wear clean socks or stockings every day. Make sure they are not too tight. Do not wear knee-high stockings since they may decrease blood flow to your legs.  Wear shoes that fit properly and have enough cushioning. To break in new shoes, wear them for just a few hours a day. This prevents you from injuring your feet. Always look in your shoes before you put them on to be sure there are no objects inside.  Do not cross your legs. This may decrease the blood flow to your feet.  If you find a minor scrape,  cut, or break in the skin on your feet, keep it and the skin around it clean and dry. These areas may be cleansed with mild soap and water. Do not cleanse the area with peroxide, alcohol, or iodine.  When you remove an adhesive bandage, be sure not to damage the skin around it.  If you have a wound, look at it several times a day to make sure it is healing.  Do not use heating pads or hot water bottles. They may burn your skin. If you have lost feeling in your feet or legs, you may not know it is happening until it is too late.  Make sure your health care provider performs a complete foot exam at least annually or more often if you have foot problems. Report any cuts, sores, or bruises to your health care provider immediately. SEEK MEDICAL CARE IF:   You have an injury that is not healing.  You have cuts or breaks in the skin.  You have an ingrown nail.  You notice redness on your legs or feet.  You feel burning or tingling in your legs or feet.  You have pain or cramps in your legs and feet.  Your legs or feet are numb.  Your feet always feel cold. SEEK IMMEDIATE MEDICAL CARE IF:   There is increasing redness,   swelling, or pain in or around a wound.  There is a red line that goes up your leg.  Pus is coming from a wound.  You develop a fever or as directed by your health care provider.  You notice a bad smell coming from an ulcer or wound. Document Released: 04/18/2000 Document Revised: 12/22/2012 Document Reviewed: 09/28/2012 Exit[employer] Patient Information 2014 [employer].  

## 2013-04-14 DIAGNOSIS — E1129 Type 2 diabetes mellitus with other diabetic kidney complication: Secondary | ICD-10-CM | POA: Diagnosis not present

## 2013-04-14 DIAGNOSIS — M109 Gout, unspecified: Secondary | ICD-10-CM | POA: Diagnosis not present

## 2013-04-14 DIAGNOSIS — N184 Chronic kidney disease, stage 4 (severe): Secondary | ICD-10-CM | POA: Diagnosis not present

## 2013-04-14 DIAGNOSIS — I10 Essential (primary) hypertension: Secondary | ICD-10-CM | POA: Diagnosis not present

## 2013-04-14 DIAGNOSIS — Z6827 Body mass index (BMI) 27.0-27.9, adult: Secondary | ICD-10-CM | POA: Diagnosis not present

## 2013-04-21 ENCOUNTER — Other Ambulatory Visit: Payer: Self-pay | Admitting: Cardiology

## 2013-05-12 DIAGNOSIS — G609 Hereditary and idiopathic neuropathy, unspecified: Secondary | ICD-10-CM | POA: Diagnosis not present

## 2013-05-12 DIAGNOSIS — E119 Type 2 diabetes mellitus without complications: Secondary | ICD-10-CM | POA: Diagnosis not present

## 2013-05-12 DIAGNOSIS — M109 Gout, unspecified: Secondary | ICD-10-CM | POA: Diagnosis not present

## 2013-05-23 ENCOUNTER — Ambulatory Visit (INDEPENDENT_AMBULATORY_CARE_PROVIDER_SITE_OTHER): Payer: Medicare Other | Admitting: Ophthalmology

## 2013-05-23 DIAGNOSIS — E1165 Type 2 diabetes mellitus with hyperglycemia: Secondary | ICD-10-CM

## 2013-05-23 DIAGNOSIS — E1139 Type 2 diabetes mellitus with other diabetic ophthalmic complication: Secondary | ICD-10-CM

## 2013-05-23 DIAGNOSIS — H35039 Hypertensive retinopathy, unspecified eye: Secondary | ICD-10-CM

## 2013-05-23 DIAGNOSIS — E11319 Type 2 diabetes mellitus with unspecified diabetic retinopathy without macular edema: Secondary | ICD-10-CM

## 2013-05-23 DIAGNOSIS — H43819 Vitreous degeneration, unspecified eye: Secondary | ICD-10-CM

## 2013-05-23 DIAGNOSIS — I10 Essential (primary) hypertension: Secondary | ICD-10-CM

## 2013-06-08 ENCOUNTER — Encounter: Payer: Self-pay | Admitting: *Deleted

## 2013-06-08 ENCOUNTER — Encounter: Payer: Self-pay | Admitting: Cardiology

## 2013-06-08 ENCOUNTER — Ambulatory Visit (INDEPENDENT_AMBULATORY_CARE_PROVIDER_SITE_OTHER): Payer: Medicare Other | Admitting: Cardiology

## 2013-06-08 VITALS — BP 144/54 | HR 52 | Ht 64.5 in | Wt 160.0 lb

## 2013-06-08 DIAGNOSIS — I2510 Atherosclerotic heart disease of native coronary artery without angina pectoris: Secondary | ICD-10-CM | POA: Diagnosis not present

## 2013-06-08 DIAGNOSIS — F17200 Nicotine dependence, unspecified, uncomplicated: Secondary | ICD-10-CM

## 2013-06-08 DIAGNOSIS — I359 Nonrheumatic aortic valve disorder, unspecified: Secondary | ICD-10-CM

## 2013-06-08 DIAGNOSIS — I739 Peripheral vascular disease, unspecified: Secondary | ICD-10-CM

## 2013-06-08 DIAGNOSIS — R0989 Other specified symptoms and signs involving the circulatory and respiratory systems: Secondary | ICD-10-CM

## 2013-06-08 DIAGNOSIS — I2589 Other forms of chronic ischemic heart disease: Secondary | ICD-10-CM

## 2013-06-08 DIAGNOSIS — I6529 Occlusion and stenosis of unspecified carotid artery: Secondary | ICD-10-CM

## 2013-06-08 DIAGNOSIS — I350 Nonrheumatic aortic (valve) stenosis: Secondary | ICD-10-CM

## 2013-06-08 MED ORDER — BISOPROLOL FUMARATE 5 MG PO TABS: ORAL_TABLET | ORAL | Status: DC

## 2013-06-08 MED ORDER — HYDRALAZINE HCL 50 MG PO TABS: ORAL_TABLET | ORAL | Status: DC

## 2013-06-08 MED ORDER — AMLODIPINE BESYLATE 2.5 MG PO TABS: 2.5000 mg | ORAL_TABLET | Freq: Every day | ORAL | Status: DC

## 2013-06-08 NOTE — Patient Instructions (Signed)
Your physician has requested that you have an echocardiogram. Echocardiography is a painless test that uses sound waves to create images of your heart. It provides your doctor with information about the size and shape of your heart and how well your heart's chambers and valves are working. This procedure takes approximately one hour. There are no restrictions for this procedure.  Your physician has requested that you have a carotid duplex. This test is an ultrasound of the carotid arteries in your neck. It looks at blood flow through these arteries that supply the brain with blood. Allow one hour for this exam. There are no restrictions or special instructions.  Your physician has requested that you have a lower  extremity arterial duplex. This test is an ultrasound of the arteries in the legs. It looks at arterial blood flow in the legs. Allow one hour for Lower and Upper Arterial scans. There are no restrictions or special instructions  Your physician wants you to follow-up in: 6 months with Dr Aundra Dubin. (August 2015). You will receive a reminder letter in the mail two months in advance. If you don't receive a letter, please call our office to schedule the follow-up appointment.

## 2013-06-09 NOTE — Progress Notes (Signed)
Patient ID: Erica Jimenez, female   DOB: 25-Sep-1929, 80 y.o.   MRN: [id number] Primary Provider:  [name]  History of Present Illness: 80 yo with PAD, CKD, CAD, mild ischemic CMP presents for followup to cardiology clinic.  No dyspnea walking on flat ground.  She can climb a flight of steps with mild dyspnea.  She is able to walk about 50 yards before her calves tighten, right > left.  There has been no change in this and if anything, it is improved.  She is still smoking <1/2 ppd.  No chest pain.    ECG:  NSR, old inferior MI, RBBB, LAFB  Labs (1/10): LDL 66, HDL 61, creatinine 2.32 Labs (10/10): creatinine 2.1 Labs (10/11): K 3.8, creatinine 2.13, K 3.8 Labs (1/12): creatinine 2.4, HCT 35.5 Labs (6/13): K 4.7, creatinine 2.2, LDL 68, HDL 47  Allergies (verified):  1)  ! Sulfa  Past Medical History: 1.  DM2 2.  Hyperlipidemia 3.  CAD:  Pt is s/p anterior MI in 1996 with stent placed in LAD.  Last myoview in our office was in 7/05 and showed apical anterior, apical, and apical inferior infarct.  Minimal peri-infarct ischemia.  EF 49%.  4. Hypothyroidism 5. Smoking: < 1ppd 6. Prior ETOH abuse 7. SMV thrombosis in 2001, coumadin x 6 mos 8. HTN 9. CVA 10. Bladder CA s/p resection and BCG treatment.  11. PAD: arterial doppler study 12/04 suggestive of > 50% bilateral SFA stenosis.  Pt has mild claudication. No invasive evaluation given CKD and desire to avoid contrast use.  ABIs (11/12): 0.57 on right, 0.68 on left.  12. CKD:  Pt is stage III-IV.  She had a fistula placed but is not on dialysis.  She follows with Dr. Lorrene Reid for nephrology.  13. gout 14. Ischemic CMP:  echo (4/11) EF 40-45%, akinesis of the mid to apical inferoseptum, apical inferior wall, and true apex.  Mild diastolic dysfunction.  Mild-moderate AS with mean gradient 16 mmHg, mild aortic insufficiency, mild mitral regurgitation.  Echo (2/13) with EF 45-50%, mid-apical inferior and septal AK, mild AS. Not able to  tolerate Coreg 12.5 mg two times a day.  15. Aortic stenosis: Mild by last echo in 2/13.   16. Carotid dopplers (3/10):  40-59% RICA.  Carotid dopplers (4/11): 40-59% RICA.  Carotid dopplers (4/12): RICA 40-59%. Carotids (2/13): 40-59% RICA.  17. Gout  Family History: Noncontributory.   Social History: Married, lives in [place] with her husband.  Her daughter lives in town.  She smokes <1/2 ppd.  She used to abuse ETOH but no longer drinks.  ROS: All systems reviewed and negative except as per HPI.    Current Outpatient Prescriptions  Medication Sig Dispense Refill  . amLODipine (NORVASC) 2.5 MG tablet Take 1 tablet (2.5 mg total) by mouth daily.  30 tablet  6  . aspirin 81 MG tablet Take 81 mg by mouth daily.        Marland Kitchen atorvastatin (LIPITOR) 80 MG tablet Take 80 mg by mouth daily.        . bisoprolol (ZEBETA) 5 MG tablet take 1/2 tablet by mouth once daily  15 tablet  6  . brinzolamide (AZOPT) 1 % ophthalmic suspension Place 1 drop into both eyes 3 (three) times daily.       . calcitRIOL (ROCALTROL) 0.25 MCG capsule Take 0.25 mcg by mouth 3 (three) times a week.      . Carboxymethylcellulose Sodium (REFRESH TEARS OP) Apply to eye.      Marland Kitchen  clopidogrel (PLAVIX) 75 MG tablet Take 75 mg by mouth daily.        . colchicine 0.6 MG tablet Take 0.6 mg by mouth daily.      . febuxostat (ULORIC) 40 MG tablet Take 40 mg by mouth daily.      . Ferrous Gluconate (FERGON PO) Take 1 tablet by mouth daily.       Marland Kitchen FOLIC ACID PO 2.2 mg. 1 tab po qd      . furosemide (LASIX) 40 MG tablet Take 40 mg by mouth daily as needed.       . hydrALAZINE (APRESOLINE) 50 MG tablet take 1 tablet by mouth twice a day  60 tablet  6  . insulin aspart (NOVOLOG) 100 UNIT/ML injection Inject into the skin 3 (three) times daily before meals. 6 units breakfast, 5 units lunch, 5 units suppper       . isosorbide mononitrate (IMDUR) 60 MG 24 hr tablet Take 60 mg by mouth daily.        Marland Kitchen latanoprost (XALATAN) 0.005 %  ophthalmic solution 1 drop at bedtime.      Marland Kitchen levothyroxine (SYNTHROID, LEVOTHROID) 50 MCG tablet Take 50 mcg by mouth daily.        . nitroGLYCERIN (NITROSTAT) 0.4 MG SL tablet Place 1 tablet (0.4 mg total) under the tongue every 5 (five) minutes as needed. Up to 3 doses  25 tablet  3  . senna (SENOKOT) 8.6 MG tablet Take 1 tablet by mouth as needed for constipation.       No current facility-administered medications for this visit.   BP 144/54  Pulse 52  Ht 5' 4.5" (1.638 m)  Wt 72.576 kg (160 lb)  BMI 27.05 kg/m2 General:  Well developed, well nourished, in no acute distress. Neck:  Neck supple, no JVD. No masses, thyromegaly or abnormal cervical nodes. Lungs:  Clear bilaterally to auscultation and percussion.  Mildly decreased breath sounds bilaterally.  Heart:  Non-displaced PMI, chest non-tender; slightly irregular rate and rhythm, S1, S2, 2/6 early SEM.  No gallop. Right carotid bruit. No ankle edema, no varicosities.  Unable to palpate pedal pulses but feet warm.   Abdomen:  Bowel sounds positive; abdomen soft and non-tender without masses, organomegaly, or hernias noted. No hepatosplenomegaly. Extremities:  No clubbing or cyanosis. Neurologic:  Alert and oriented x 3.  Assessment/Plan: 1. CAD: No ischemic symptoms.  She will continue ASA 81, statin.  Will continue Plavix long-term given CAD and PAD with no bleeding sequelae.  2. Ischemic cardiomyopathy: NYHA class II symptoms.  She is not volume overloaded on exam.  She has Lasix to use as needed but has not been using it.  Continue bisoprolol and hydralazine/Imdur at current doses. She is not on ACEI or spironolactone due to CKD. 3. PAD: R>L calf claudication, very mildly worse over the last couple of years.  Not good cilostazol candidate with history of CHF.  Will continue ASA, Plavix, statin.  Will get repeat ABIs.  4. Carotid stenosis: Will get carotid dopplers.  5. Hyperlipidemia: Check lipids today.  6. Aortic stenosis:  History of mild to moderate AS.  I will set her up for a repeat echocardiogram for monitoring.  7. CKD: Check BMET.  8. Smoking: I again encouraged her to quit smoking.   Loralie Champagne 06/09/2013 1:36 PM

## 2013-06-13 ENCOUNTER — Ambulatory Visit (HOSPITAL_COMMUNITY): Payer: Medicare Other | Attending: Cardiology

## 2013-06-13 ENCOUNTER — Encounter: Payer: Self-pay | Admitting: Cardiology

## 2013-06-13 DIAGNOSIS — I739 Peripheral vascular disease, unspecified: Secondary | ICD-10-CM | POA: Insufficient documentation

## 2013-06-13 DIAGNOSIS — I658 Occlusion and stenosis of other precerebral arteries: Secondary | ICD-10-CM | POA: Insufficient documentation

## 2013-06-13 DIAGNOSIS — E119 Type 2 diabetes mellitus without complications: Secondary | ICD-10-CM | POA: Insufficient documentation

## 2013-06-13 DIAGNOSIS — F17200 Nicotine dependence, unspecified, uncomplicated: Secondary | ICD-10-CM | POA: Diagnosis not present

## 2013-06-13 DIAGNOSIS — E785 Hyperlipidemia, unspecified: Secondary | ICD-10-CM | POA: Insufficient documentation

## 2013-06-13 DIAGNOSIS — I2510 Atherosclerotic heart disease of native coronary artery without angina pectoris: Secondary | ICD-10-CM | POA: Diagnosis not present

## 2013-06-13 DIAGNOSIS — I6529 Occlusion and stenosis of unspecified carotid artery: Secondary | ICD-10-CM | POA: Diagnosis not present

## 2013-06-13 DIAGNOSIS — I350 Nonrheumatic aortic (valve) stenosis: Secondary | ICD-10-CM

## 2013-06-13 DIAGNOSIS — R0989 Other specified symptoms and signs involving the circulatory and respiratory systems: Secondary | ICD-10-CM | POA: Insufficient documentation

## 2013-06-15 DIAGNOSIS — D631 Anemia in chronic kidney disease: Secondary | ICD-10-CM | POA: Diagnosis not present

## 2013-06-15 DIAGNOSIS — I129 Hypertensive chronic kidney disease with stage 1 through stage 4 chronic kidney disease, or unspecified chronic kidney disease: Secondary | ICD-10-CM | POA: Diagnosis not present

## 2013-06-15 DIAGNOSIS — N2581 Secondary hyperparathyroidism of renal origin: Secondary | ICD-10-CM | POA: Diagnosis not present

## 2013-06-15 DIAGNOSIS — N184 Chronic kidney disease, stage 4 (severe): Secondary | ICD-10-CM | POA: Diagnosis not present

## 2013-06-16 ENCOUNTER — Other Ambulatory Visit: Payer: Self-pay | Admitting: Cardiology

## 2013-06-20 ENCOUNTER — Other Ambulatory Visit: Payer: Self-pay | Admitting: *Deleted

## 2013-06-30 ENCOUNTER — Other Ambulatory Visit (HOSPITAL_COMMUNITY): Payer: Medicare Other

## 2013-07-08 ENCOUNTER — Ambulatory Visit (HOSPITAL_COMMUNITY): Payer: Medicare Other | Attending: Cardiology | Admitting: Cardiology

## 2013-07-08 DIAGNOSIS — I2589 Other forms of chronic ischemic heart disease: Secondary | ICD-10-CM

## 2013-07-08 DIAGNOSIS — I350 Nonrheumatic aortic (valve) stenosis: Secondary | ICD-10-CM

## 2013-07-08 DIAGNOSIS — I059 Rheumatic mitral valve disease, unspecified: Secondary | ICD-10-CM | POA: Insufficient documentation

## 2013-07-08 DIAGNOSIS — I359 Nonrheumatic aortic valve disorder, unspecified: Secondary | ICD-10-CM | POA: Diagnosis not present

## 2013-07-08 DIAGNOSIS — I079 Rheumatic tricuspid valve disease, unspecified: Secondary | ICD-10-CM | POA: Insufficient documentation

## 2013-07-08 DIAGNOSIS — I739 Peripheral vascular disease, unspecified: Secondary | ICD-10-CM

## 2013-07-08 DIAGNOSIS — I6529 Occlusion and stenosis of unspecified carotid artery: Secondary | ICD-10-CM

## 2013-07-08 NOTE — Progress Notes (Signed)
Echo completed

## 2013-07-13 DIAGNOSIS — I10 Essential (primary) hypertension: Secondary | ICD-10-CM | POA: Diagnosis not present

## 2013-07-13 DIAGNOSIS — N184 Chronic kidney disease, stage 4 (severe): Secondary | ICD-10-CM | POA: Diagnosis not present

## 2013-07-13 DIAGNOSIS — E1129 Type 2 diabetes mellitus with other diabetic kidney complication: Secondary | ICD-10-CM | POA: Diagnosis not present

## 2013-07-13 DIAGNOSIS — J438 Other emphysema: Secondary | ICD-10-CM | POA: Diagnosis not present

## 2013-07-18 DIAGNOSIS — H18419 Arcus senilis, unspecified eye: Secondary | ICD-10-CM | POA: Diagnosis not present

## 2013-07-18 DIAGNOSIS — H4011X0 Primary open-angle glaucoma, stage unspecified: Secondary | ICD-10-CM | POA: Diagnosis not present

## 2013-07-18 DIAGNOSIS — H04129 Dry eye syndrome of unspecified lacrimal gland: Secondary | ICD-10-CM | POA: Diagnosis not present

## 2013-07-18 DIAGNOSIS — Z961 Presence of intraocular lens: Secondary | ICD-10-CM | POA: Diagnosis not present

## 2013-07-19 ENCOUNTER — Ambulatory Visit: Payer: Medicare Other

## 2013-07-19 ENCOUNTER — Encounter: Payer: Self-pay | Admitting: Cardiovascular Disease

## 2013-07-19 ENCOUNTER — Ambulatory Visit (INDEPENDENT_AMBULATORY_CARE_PROVIDER_SITE_OTHER): Payer: Medicare Other | Admitting: Cardiovascular Disease

## 2013-07-19 VITALS — BP 130/56 | HR 62 | Ht 64.5 in | Wt 162.0 lb

## 2013-07-19 DIAGNOSIS — I2510 Atherosclerotic heart disease of native coronary artery without angina pectoris: Secondary | ICD-10-CM | POA: Diagnosis not present

## 2013-07-19 DIAGNOSIS — I739 Peripheral vascular disease, unspecified: Secondary | ICD-10-CM | POA: Diagnosis not present

## 2013-07-19 NOTE — Patient Instructions (Signed)
Your physician has requested that you have an aortoiliac duplex. During this test, an ultrasound is used to evaluate the aorta. Allow 30 minutes for this exam. Do not eat after midnight the day before and avoid carbonated beverages  Your physician has requested that you have a lower extremity arterial duplex. This test is an ultrasound of the arteries in the legs. It looks at arterial blood flow in the legs. Allow one hour for Lower Arterial scans. There are no restrictions or special instructions.  Your physician recommends that you schedule a follow-up appointment in: 3 months with Dr. Fletcher Anon.   Your physician recommends that you continue on your current medications as directed. Please refer to the Current Medication list given to you today.

## 2013-07-21 DIAGNOSIS — M109 Gout, unspecified: Secondary | ICD-10-CM | POA: Diagnosis not present

## 2013-07-21 DIAGNOSIS — E039 Hypothyroidism, unspecified: Secondary | ICD-10-CM | POA: Diagnosis not present

## 2013-07-21 DIAGNOSIS — E1129 Type 2 diabetes mellitus with other diabetic kidney complication: Secondary | ICD-10-CM | POA: Diagnosis not present

## 2013-07-21 DIAGNOSIS — E785 Hyperlipidemia, unspecified: Secondary | ICD-10-CM | POA: Diagnosis not present

## 2013-07-21 NOTE — Progress Notes (Signed)
Patient ID: Erica Jimenez, female   DOB: 1930-03-13, 80 y.o.   MRN: [id number] Primary Provider:  [name]  History of Present Illness: 80 yo female who was reffered by Dr. Aundra Dubin for evaluation and management of peripheral arterial disease. She has known history of CKD, CAD, mild ischemic CMP .  No dyspnea walking on flat ground.  She can climb a flight of steps with mild dyspnea.  She is able to walk about 50 yards before her calves tighten, right > left.  She has prolonged history of tobacco use. She reports having peripheral arterial disease for many years. When she goes to the grocery store, she typically needs to rest at least 2 times for about 5 minutes before she can finish her shopping. Recent noninvasive evaluation showed an ABI of 0.47 on the right and 0.55 on the left.   ECG:  NSR, old inferior MI, RBBB, LAFB  Labs (1/10): LDL 66, HDL 61, creatinine 2.32 Labs (10/10): creatinine 2.1 Labs (10/11): K 3.8, creatinine 2.13, K 3.8 Labs (1/12): creatinine 2.4, HCT 35.5 Labs (6/13): K 4.7, creatinine 2.2, LDL 68, HDL 47  Allergies (verified):  1)  ! Sulfa  Past Medical History: 1.  DM2 2.  Hyperlipidemia 3.  CAD:  Pt is s/p anterior MI in 1996 with stent placed in LAD.  Last myoview in our office was in 7/05 and showed apical anterior, apical, and apical inferior infarct.  Minimal peri-infarct ischemia.  EF 49%.  4. Hypothyroidism 5. Smoking: < 1ppd 6. Prior ETOH abuse 7. SMV thrombosis in 2001, coumadin x 6 mos 8. HTN 9. CVA 10. Bladder CA s/p resection and BCG treatment.  11. PAD: arterial doppler study 12/04 suggestive of > 50% bilateral SFA stenosis.  Pt has mild claudication. No invasive evaluation given CKD and desire to avoid contrast use.  ABIs (11/12): 0.57 on right, 0.68 on left.  12. CKD:  Pt is stage III-IV.  She had a fistula placed but is not on dialysis.  She follows with Dr. Lorrene Reid for nephrology.  13. gout 14. Ischemic CMP:  echo (4/11) EF 40-45%,  akinesis of the mid to apical inferoseptum, apical inferior wall, and true apex.  Mild diastolic dysfunction.  Mild-moderate AS with mean gradient 16 mmHg, mild aortic insufficiency, mild mitral regurgitation.  Echo (2/13) with EF 45-50%, mid-apical inferior and septal AK, mild AS. Not able to tolerate Coreg 12.5 mg two times a day.  15. Aortic stenosis: Mild by last echo in 2/13.   16. Carotid dopplers (3/10):  40-59% RICA.  Carotid dopplers (4/11): 40-59% RICA.  Carotid dopplers (4/12): RICA 40-59%. Carotids (2/13): 40-59% RICA.  17. Gout  Family History: Noncontributory.   Social History: Married, lives in [place] with her husband.  Her daughter lives in town.  She smokes <1/2 ppd.  She used to abuse ETOH but no longer drinks.  ROS: All systems reviewed and negative except as per HPI.    Current Outpatient Prescriptions  Medication Sig Dispense Refill  . amLODipine (NORVASC) 2.5 MG tablet Take 1 tablet (2.5 mg total) by mouth daily.  30 tablet  6  . aspirin 81 MG tablet Take 81 mg by mouth daily.        Marland Kitchen atorvastatin (LIPITOR) 80 MG tablet Take 80 mg by mouth daily.        . bisoprolol (ZEBETA) 5 MG tablet take 1/2 tablet by mouth once daily  15 tablet  6  . brinzolamide (AZOPT) 1 % ophthalmic suspension Place  1 drop into both eyes 3 (three) times daily.       . calcitRIOL (ROCALTROL) 0.25 MCG capsule Take 0.25 mcg by mouth 3 (three) times a week.      . Carboxymethylcellulose Sodium (REFRESH TEARS OP) Apply to eye.      . clopidogrel (PLAVIX) 75 MG tablet Take 75 mg by mouth daily.        . colchicine 0.6 MG tablet Take 0.6 mg by mouth daily.      . febuxostat (ULORIC) 40 MG tablet Take 40 mg by mouth daily.      . Ferrous Gluconate (FERGON PO) Take 1 tablet by mouth daily.       Marland Kitchen FOLIC ACID PO 2.2 mg. 1 tab po qd      . furosemide (LASIX) 40 MG tablet Take 40 mg by mouth daily as needed.       . hydrALAZINE (APRESOLINE) 50 MG tablet take 1 tablet by mouth twice a day  60 tablet   6  . insulin aspart (NOVOLOG) 100 UNIT/ML injection Inject into the skin 3 (three) times daily before meals. 6 units breakfast, 5 units lunch, 5 units suppper       . isosorbide mononitrate (IMDUR) 60 MG 24 hr tablet Take 60 mg by mouth daily.        Marland Kitchen latanoprost (XALATAN) 0.005 % ophthalmic solution 1 drop at bedtime.      Marland Kitchen levothyroxine (SYNTHROID, LEVOTHROID) 50 MCG tablet Take 50 mcg by mouth daily.        Marland Kitchen NITROSTAT 0.4 MG SL tablet PLACE 1 TABLET UNDER THE TONGUE EVERY 5 MINUTES AS NEEDED UP TO 3 DOSES  25 tablet  3  . senna (SENOKOT) 8.6 MG tablet Take 1 tablet by mouth as needed for constipation.       No current facility-administered medications for this visit.   BP 130/56  Pulse 62  Ht 5' 4.5" (1.638 m)  Wt 162 lb (73.483 kg)  BMI 27.39 kg/m2 General:  Well developed, well nourished, in no acute distress. Neck:  Neck supple, no JVD. No masses, thyromegaly or abnormal cervical nodes. Lungs:  Clear bilaterally to auscultation and percussion.  Mildly decreased breath sounds bilaterally.  Heart:  Non-displaced PMI, chest non-tender; slightly irregular rate and rhythm, S1, S2, 2/6 early SEM.  No gallop. Right carotid bruit. No ankle edema, no varicosities.  Unable to palpate pedal pulses but feet warm.   Abdomen:  Bowel sounds positive; abdomen soft and non-tender without masses, organomegaly, or hernias noted. No hepatosplenomegaly. Extremities:  No clubbing or cyanosis. Neurologic:  Alert and oriented x 3. Vascular: Femoral pulses are not palpable bilaterally. Distal pulses are also not palpable.  Assessment/Plan: Peripheral arterial disease with severe lifestyle limiting bilateral leg claudication (Rutherford class 3) right worse than left . She likely has multilevel disease based on physical exam with significant aortoiliac involvement as well. Angiography and Endovascular intervention is limited by advanced chronic kidney disease . I requested no extremity arterial duplex and  aortoiliac duplex to see if there is approachable disease.  I agree that Pletal cannot be used due to ischemic cardiomyopathy. I strongly advised her to quit smoking and continue a walking program.  Kathlyn Sacramento 07/21/2013 2:33 PM

## 2013-07-28 DIAGNOSIS — Z0000 Encounter for general adult medical examination without abnormal findings: Secondary | ICD-10-CM | POA: Diagnosis not present

## 2013-07-28 DIAGNOSIS — E039 Hypothyroidism, unspecified: Secondary | ICD-10-CM | POA: Diagnosis not present

## 2013-07-28 DIAGNOSIS — E1159 Type 2 diabetes mellitus with other circulatory complications: Secondary | ICD-10-CM | POA: Diagnosis not present

## 2013-07-28 DIAGNOSIS — Z1331 Encounter for screening for depression: Secondary | ICD-10-CM | POA: Diagnosis not present

## 2013-07-28 DIAGNOSIS — I2510 Atherosclerotic heart disease of native coronary artery without angina pectoris: Secondary | ICD-10-CM | POA: Diagnosis not present

## 2013-07-28 DIAGNOSIS — N184 Chronic kidney disease, stage 4 (severe): Secondary | ICD-10-CM | POA: Diagnosis not present

## 2013-07-28 DIAGNOSIS — J438 Other emphysema: Secondary | ICD-10-CM | POA: Diagnosis not present

## 2013-07-28 DIAGNOSIS — E785 Hyperlipidemia, unspecified: Secondary | ICD-10-CM | POA: Diagnosis not present

## 2013-07-28 DIAGNOSIS — M109 Gout, unspecified: Secondary | ICD-10-CM | POA: Diagnosis not present

## 2013-08-05 ENCOUNTER — Ambulatory Visit (HOSPITAL_COMMUNITY): Payer: Medicare Other | Attending: Cardiovascular Disease | Admitting: Cardiology

## 2013-08-05 ENCOUNTER — Encounter: Payer: Self-pay | Admitting: Cardiovascular Disease

## 2013-08-05 DIAGNOSIS — I700 Atherosclerosis of aorta: Secondary | ICD-10-CM

## 2013-08-05 DIAGNOSIS — F17200 Nicotine dependence, unspecified, uncomplicated: Secondary | ICD-10-CM | POA: Insufficient documentation

## 2013-08-05 DIAGNOSIS — I739 Peripheral vascular disease, unspecified: Secondary | ICD-10-CM | POA: Insufficient documentation

## 2013-08-05 DIAGNOSIS — E785 Hyperlipidemia, unspecified: Secondary | ICD-10-CM | POA: Diagnosis not present

## 2013-08-05 DIAGNOSIS — E119 Type 2 diabetes mellitus without complications: Secondary | ICD-10-CM | POA: Insufficient documentation

## 2013-08-05 DIAGNOSIS — I10 Essential (primary) hypertension: Secondary | ICD-10-CM | POA: Diagnosis not present

## 2013-08-05 DIAGNOSIS — I2510 Atherosclerotic heart disease of native coronary artery without angina pectoris: Secondary | ICD-10-CM | POA: Insufficient documentation

## 2013-08-05 DIAGNOSIS — I708 Atherosclerosis of other arteries: Secondary | ICD-10-CM | POA: Diagnosis not present

## 2013-08-05 NOTE — Progress Notes (Signed)
Le arterial duplex and doppler completed

## 2013-08-05 NOTE — Progress Notes (Signed)
Aortic duplex completed 

## 2013-08-08 DIAGNOSIS — Z1212 Encounter for screening for malignant neoplasm of rectum: Secondary | ICD-10-CM | POA: Diagnosis not present

## 2013-08-09 ENCOUNTER — Ambulatory Visit: Payer: Medicare Other

## 2013-08-09 VITALS — BP 133/59 | HR 62 | Resp 16

## 2013-08-09 DIAGNOSIS — Q828 Other specified congenital malformations of skin: Secondary | ICD-10-CM | POA: Diagnosis not present

## 2013-08-09 DIAGNOSIS — I739 Peripheral vascular disease, unspecified: Secondary | ICD-10-CM | POA: Diagnosis not present

## 2013-08-09 DIAGNOSIS — E1142 Type 2 diabetes mellitus with diabetic polyneuropathy: Secondary | ICD-10-CM

## 2013-08-09 DIAGNOSIS — E1140 Type 2 diabetes mellitus with diabetic neuropathy, unspecified: Secondary | ICD-10-CM

## 2013-08-09 DIAGNOSIS — E1149 Type 2 diabetes mellitus with other diabetic neurological complication: Secondary | ICD-10-CM

## 2013-08-09 DIAGNOSIS — L608 Other nail disorders: Secondary | ICD-10-CM

## 2013-08-09 NOTE — Patient Instructions (Signed)
Diabetes and Foot Care Diabetes may cause you to have problems because of poor blood supply (circulation) to your feet and legs. This may cause the skin on your feet to become thinner, break easier, and heal more slowly. Your skin may become dry, and the skin may peel and crack. You may also have nerve damage in your legs and feet causing decreased feeling in them. You may not notice minor injuries to your feet that could lead to infections or more serious problems. Taking care of your feet is one of the most important things you can do for yourself.  HOME CARE INSTRUCTIONS  Wear shoes at all times, even in the house. Do not go barefoot. Bare feet are easily injured.  Check your feet daily for blisters, cuts, and redness. If you cannot see the bottom of your feet, use a mirror or ask someone for help.  Wash your feet with warm water (do not use hot water) and mild soap. Then pat your feet and the areas between your toes until they are completely dry. Do not soak your feet as this can dry your skin.  Apply a moisturizing lotion or petroleum jelly (that does not contain alcohol and is unscented) to the skin on your feet and to dry, brittle toenails. Do not apply lotion between your toes.  Trim your toenails straight across. Do not dig under them or around the cuticle. File the edges of your nails with an emery board or nail file.  Do not cut corns or calluses or try to remove them with medicine.  Wear clean socks or stockings every day. Make sure they are not too tight. Do not wear knee-high stockings since they may decrease blood flow to your legs.  Wear shoes that fit properly and have enough cushioning. To break in new shoes, wear them for just a few hours a day. This prevents you from injuring your feet. Always look in your shoes before you put them on to be sure there are no objects inside.  Do not cross your legs. This may decrease the blood flow to your feet.  If you find a minor scrape,  cut, or break in the skin on your feet, keep it and the skin around it clean and dry. These areas may be cleansed with mild soap and water. Do not cleanse the area with peroxide, alcohol, or iodine.  When you remove an adhesive bandage, be sure not to damage the skin around it.  If you have a wound, look at it several times a day to make sure it is healing.  Do not use heating pads or hot water bottles. They may burn your skin. If you have lost feeling in your feet or legs, you may not know it is happening until it is too late.  Make sure your health care provider performs a complete foot exam at least annually or more often if you have foot problems. Report any cuts, sores, or bruises to your health care provider immediately. SEEK MEDICAL CARE IF:   You have an injury that is not healing.  You have cuts or breaks in the skin.  You have an ingrown nail.  You notice redness on your legs or feet.  You feel burning or tingling in your legs or feet.  You have pain or cramps in your legs and feet.  Your legs or feet are numb.  Your feet always feel cold. SEEK IMMEDIATE MEDICAL CARE IF:   There is increasing redness,   swelling, or pain in or around a wound.  There is a red line that goes up your leg.  Pus is coming from a wound.  You develop a fever or as directed by your health care provider.  You notice a bad smell coming from an ulcer or wound. Document Released: 04/18/2000 Document Revised: 12/22/2012 Document Reviewed: 09/28/2012 Exit[employer] Patient Information 2014 [employer].  

## 2013-08-09 NOTE — Progress Notes (Signed)
   Subjective:    Patient ID: Erica Jimenez, female    DOB: 1929-10-25, 80 y.o.   MRN: [id number]  HPI Comments: "Trim toenails"     Review of Systems no new changes or findings most recent A1c was 6.9 recent uric acid was 6.1 patient has elevated blood glucose also her BUN/creatinine recent elevated on 50 mg deciliter creatinine 2.0 mg/dL at recent blood work done on March 23 of this year. At functions were unremarkable mild anemia is noted cluster was unremarkable there was recommendation made for possible lower extremity vascular studies at to avoid NSAIDs to     Objective:   Physical Exam Lotion the objective findings as follows should note patient appears to be well-developed well-nourished white female oriented x3 presents at this time with that we can pulses dorsalis pedis ready at one over 4 at best bilateral PT nonpalpable bilateral +1 edema noted bilateral patient is having more problems with cramping can only walk Sir small distance before she is to sitdown take a break. Recent lab work demonstrated relatively good blood glucose control still has mild renal disease and is resolving from history of gout. Epicritic and proprioceptive sensations diminished on Semmes Weinstein testing to the digits and plantar forefoot there is intact normal plantar response and DTRs not elicited. Dermatologically nails thick brittle and criptotic particular hallux nails bilateral painful on debridement and on palpation and ambulation at times any BK the future for possible permanent nail excision of the nail borders of the hallux bilateral future. Patient also keratoses hammertoe deformity fifth digits bilateral with associated HD 5 bilateral. Poor keratotic lesions are noted no open wounds ulcerations no secondary infection is noted.       Assessment & Plan:  Assessment this time his diabetes with peripheral neuropathy also history of angiopathy patient would benefit from and is scheduled for a vascular  workup and evaluation Doppler study sometime in the future possibly later this month and scheduled by her primary physician. At this time nails debrided x10 also multiple keratotic lesions fifth digits bilateral debrided return for future palliative care and as-needed basis suggest 3 month followup copies of her labs were also entered into her file  Harriet Masson DPM.

## 2013-08-11 ENCOUNTER — Encounter (HOSPITAL_COMMUNITY): Payer: Self-pay | Admitting: Pharmacy Technician

## 2013-08-16 ENCOUNTER — Other Ambulatory Visit: Payer: Self-pay | Admitting: Cardiovascular Disease

## 2013-08-16 DIAGNOSIS — I739 Peripheral vascular disease, unspecified: Secondary | ICD-10-CM

## 2013-08-17 ENCOUNTER — Encounter (HOSPITAL_COMMUNITY)
Admission: RE | Disposition: A | Discharge status: Home / Self Care | Payer: Self-pay | Source: Ambulatory Visit | Attending: Cardiovascular Disease

## 2013-08-17 ENCOUNTER — Ambulatory Visit (HOSPITAL_COMMUNITY)
Admission: RE | Admit: 2013-08-17 | Discharge: 2013-08-17 | Disposition: A | Discharge status: Home / Self Care | Payer: Medicare Other | Source: Ambulatory Visit | Attending: Cardiovascular Disease | Admitting: Cardiovascular Disease

## 2013-08-17 DIAGNOSIS — I6529 Occlusion and stenosis of unspecified carotid artery: Secondary | ICD-10-CM | POA: Diagnosis not present

## 2013-08-17 DIAGNOSIS — I252 Old myocardial infarction: Secondary | ICD-10-CM | POA: Diagnosis not present

## 2013-08-17 DIAGNOSIS — I2510 Atherosclerotic heart disease of native coronary artery without angina pectoris: Secondary | ICD-10-CM | POA: Insufficient documentation

## 2013-08-17 DIAGNOSIS — I129 Hypertensive chronic kidney disease with stage 1 through stage 4 chronic kidney disease, or unspecified chronic kidney disease: Secondary | ICD-10-CM | POA: Insufficient documentation

## 2013-08-17 DIAGNOSIS — Z7982 Long term (current) use of aspirin: Secondary | ICD-10-CM | POA: Diagnosis not present

## 2013-08-17 DIAGNOSIS — Z8551 Personal history of malignant neoplasm of bladder: Secondary | ICD-10-CM | POA: Diagnosis not present

## 2013-08-17 DIAGNOSIS — I059 Rheumatic mitral valve disease, unspecified: Secondary | ICD-10-CM | POA: Insufficient documentation

## 2013-08-17 DIAGNOSIS — I2589 Other forms of chronic ischemic heart disease: Secondary | ICD-10-CM | POA: Diagnosis not present

## 2013-08-17 DIAGNOSIS — E039 Hypothyroidism, unspecified: Secondary | ICD-10-CM | POA: Insufficient documentation

## 2013-08-17 DIAGNOSIS — Z86718 Personal history of other venous thrombosis and embolism: Secondary | ICD-10-CM | POA: Diagnosis not present

## 2013-08-17 DIAGNOSIS — M109 Gout, unspecified: Secondary | ICD-10-CM | POA: Diagnosis not present

## 2013-08-17 DIAGNOSIS — I70219 Atherosclerosis of native arteries of extremities with intermittent claudication, unspecified extremity: Secondary | ICD-10-CM | POA: Diagnosis not present

## 2013-08-17 DIAGNOSIS — E119 Type 2 diabetes mellitus without complications: Secondary | ICD-10-CM | POA: Diagnosis not present

## 2013-08-17 DIAGNOSIS — Z9861 Coronary angioplasty status: Secondary | ICD-10-CM | POA: Insufficient documentation

## 2013-08-17 DIAGNOSIS — F17200 Nicotine dependence, unspecified, uncomplicated: Secondary | ICD-10-CM | POA: Insufficient documentation

## 2013-08-17 DIAGNOSIS — I359 Nonrheumatic aortic valve disorder, unspecified: Secondary | ICD-10-CM | POA: Diagnosis not present

## 2013-08-17 DIAGNOSIS — N184 Chronic kidney disease, stage 4 (severe): Secondary | ICD-10-CM | POA: Insufficient documentation

## 2013-08-17 DIAGNOSIS — E785 Hyperlipidemia, unspecified: Secondary | ICD-10-CM | POA: Insufficient documentation

## 2013-08-17 DIAGNOSIS — Z794 Long term (current) use of insulin: Secondary | ICD-10-CM | POA: Insufficient documentation

## 2013-08-17 DIAGNOSIS — Z7902 Long term (current) use of antithrombotics/antiplatelets: Secondary | ICD-10-CM | POA: Diagnosis not present

## 2013-08-17 DIAGNOSIS — I739 Peripheral vascular disease, unspecified: Secondary | ICD-10-CM

## 2013-08-17 HISTORY — PX: ABDOMINAL AORTAGRAM: SHX5454

## 2013-08-17 LAB — CBC
HCT: 35.4 % — ABNORMAL LOW (ref 36.0–46.0)
HEMOGLOBIN: 11.7 g/dL — AB (ref 12.0–15.0)
MCH: 30.6 pg (ref 26.0–34.0)
MCHC: 33.1 g/dL (ref 30.0–36.0)
MCV: 92.7 fL (ref 78.0–100.0)
Platelets: 164 10*3/uL (ref 150–400)
RBC: 3.82 MIL/uL — AB (ref 3.87–5.11)
RDW: 17.4 % — ABNORMAL HIGH (ref 11.5–15.5)
WBC: 6.9 10*3/uL (ref 4.0–10.5)

## 2013-08-17 LAB — GLUCOSE, CAPILLARY
Glucose-Capillary: 150 mg/dL — ABNORMAL HIGH (ref 70–99)
Glucose-Capillary: 141 mg/dL — ABNORMAL HIGH (ref 70–99)

## 2013-08-17 LAB — BASIC METABOLIC PANEL
BUN: 61 mg/dL — ABNORMAL HIGH (ref 6–23)
CHLORIDE: 104 meq/L (ref 96–112)
CO2: 19 mEq/L (ref 19–32)
Calcium: 9.6 mg/dL (ref 8.4–10.5)
Creatinine, Ser: 2.08 mg/dL — ABNORMAL HIGH (ref 0.50–1.10)
GFR, EST AFRICAN AMERICAN: 25 mL/min — AB (ref >90)
GFR, EST NON AFRICAN AMERICAN: 21 mL/min — AB (ref >90)
GLUCOSE: 164 mg/dL — AB (ref 70–99)
Potassium: 4.2 mEq/L (ref 3.7–5.3)
SODIUM: 142 meq/L (ref 137–147)

## 2013-08-17 LAB — PROTIME-INR
INR: 0.9 (ref 0.00–1.49)
Prothrombin Time: 12 seconds (ref 11.6–15.2)

## 2013-08-17 SURGERY — ABDOMINAL AORTAGRAM
Anesthesia: LOCAL

## 2013-08-17 MED ORDER — MIDAZOLAM HCL 2 MG/2ML IJ SOLN
INTRAMUSCULAR | Status: AC
  Filled 2013-08-17: qty 2

## 2013-08-17 MED ORDER — SODIUM CHLORIDE 0.9 % IV SOLN
INTRAVENOUS | Status: DC
  Administered 2013-08-17: 09:00:00 via INTRAVENOUS

## 2013-08-17 MED ORDER — ONDANSETRON HCL 4 MG/2ML IJ SOLN
INTRAMUSCULAR | Status: AC
  Filled 2013-08-17: qty 2

## 2013-08-17 MED ORDER — FENTANYL CITRATE 0.05 MG/ML IJ SOLN
INTRAMUSCULAR | Status: AC
  Filled 2013-08-17: qty 2

## 2013-08-17 MED ORDER — SODIUM CHLORIDE 0.9 % IV SOLN: 250.0000 mL | INTRAVENOUS | Status: DC | PRN

## 2013-08-17 MED ORDER — LIDOCAINE HCL (PF) 1 % IJ SOLN
INTRAMUSCULAR | Status: AC
  Filled 2013-08-17: qty 30

## 2013-08-17 MED ORDER — HYDRALAZINE HCL 20 MG/ML IJ SOLN
INTRAMUSCULAR | Status: AC
  Filled 2013-08-17: qty 1

## 2013-08-17 MED ORDER — SODIUM CHLORIDE 0.9 % IV SOLN: INTRAVENOUS | Status: DC

## 2013-08-17 MED ORDER — SODIUM CHLORIDE 0.9 % IJ SOLN: 3.0000 mL | INTRAMUSCULAR | Status: DC | PRN

## 2013-08-17 MED ORDER — SODIUM CHLORIDE 0.9 % IJ SOLN: 3.0000 mL | Freq: Two times a day (BID) | INTRAMUSCULAR | Status: DC

## 2013-08-17 MED ORDER — ASPIRIN 81 MG PO CHEW
81.0000 mg | CHEWABLE_TABLET | ORAL | Status: AC
  Administered 2013-08-17: 81 mg via ORAL
  Filled 2013-08-17: qty 1

## 2013-08-17 MED ORDER — HEPARIN (PORCINE) IN NACL 2-0.9 UNIT/ML-% IJ SOLN
INTRAMUSCULAR | Status: AC
  Filled 2013-08-17: qty 1000

## 2013-08-17 NOTE — H&P (View-Only) (Signed)
Patient ID: Erica Jimenez, female   DOB: 1929-11-01, 80 y.o.   MRN: [id number] Primary Provider:  [name]  History of Present Illness: 80 yo female who was reffered by Dr. Aundra Dubin for evaluation and management of peripheral arterial disease. She has known history of CKD, CAD, mild ischemic CMP .  No dyspnea walking on flat ground.  She can climb a flight of steps with mild dyspnea.  She is able to walk about 50 yards before her calves tighten, right > left.  She has prolonged history of tobacco use. She reports having peripheral arterial disease for many years. When she goes to the grocery store, she typically needs to rest at least 2 times for about 5 minutes before she can finish her shopping. Recent noninvasive evaluation showed an ABI of 0.47 on the right and 0.55 on the left.   ECG:  NSR, old inferior MI, RBBB, LAFB  Labs (1/10): LDL 66, HDL 61, creatinine 2.32 Labs (10/10): creatinine 2.1 Labs (10/11): K 3.8, creatinine 2.13, K 3.8 Labs (1/12): creatinine 2.4, HCT 35.5 Labs (6/13): K 4.7, creatinine 2.2, LDL 68, HDL 47  Allergies (verified):  1)  ! Sulfa  Past Medical History: 1.  DM2 2.  Hyperlipidemia 3.  CAD:  Pt is s/p anterior MI in 1996 with stent placed in LAD.  Last myoview in our office was in 7/05 and showed apical anterior, apical, and apical inferior infarct.  Minimal peri-infarct ischemia.  EF 49%.  4. Hypothyroidism 5. Smoking: < 1ppd 6. Prior ETOH abuse 7. SMV thrombosis in 2001, coumadin x 6 mos 8. HTN 9. CVA 10. Bladder CA s/p resection and BCG treatment.  11. PAD: arterial doppler study 12/04 suggestive of > 50% bilateral SFA stenosis.  Pt has mild claudication. No invasive evaluation given CKD and desire to avoid contrast use.  ABIs (11/12): 0.57 on right, 0.68 on left.  12. CKD:  Pt is stage III-IV.  She had a fistula placed but is not on dialysis.  She follows with Dr. Lorrene Reid for nephrology.  13. gout 14. Ischemic CMP:  echo (4/11) EF 40-45%,  akinesis of the mid to apical inferoseptum, apical inferior wall, and true apex.  Mild diastolic dysfunction.  Mild-moderate AS with mean gradient 16 mmHg, mild aortic insufficiency, mild mitral regurgitation.  Echo (2/13) with EF 45-50%, mid-apical inferior and septal AK, mild AS. Not able to tolerate Coreg 12.5 mg two times a day.  15. Aortic stenosis: Mild by last echo in 2/13.   16. Carotid dopplers (3/10):  40-59% RICA.  Carotid dopplers (4/11): 40-59% RICA.  Carotid dopplers (4/12): RICA 40-59%. Carotids (2/13): 40-59% RICA.  17. Gout  Family History: Noncontributory.   Social History: Married, lives in [place] with her husband.  Her daughter lives in town.  She smokes <1/2 ppd.  She used to abuse ETOH but no longer drinks.  ROS: All systems reviewed and negative except as per HPI.    Current Outpatient Prescriptions  Medication Sig Dispense Refill  . amLODipine (NORVASC) 2.5 MG tablet Take 1 tablet (2.5 mg total) by mouth daily.  30 tablet  6  . aspirin 81 MG tablet Take 81 mg by mouth daily.        Marland Kitchen atorvastatin (LIPITOR) 80 MG tablet Take 80 mg by mouth daily.        . bisoprolol (ZEBETA) 5 MG tablet take 1/2 tablet by mouth once daily  15 tablet  6  . brinzolamide (AZOPT) 1 % ophthalmic suspension Place  1 drop into both eyes 3 (three) times daily.       . calcitRIOL (ROCALTROL) 0.25 MCG capsule Take 0.25 mcg by mouth 3 (three) times a week.      . Carboxymethylcellulose Sodium (REFRESH TEARS OP) Apply to eye.      . clopidogrel (PLAVIX) 75 MG tablet Take 75 mg by mouth daily.        . colchicine 0.6 MG tablet Take 0.6 mg by mouth daily.      . febuxostat (ULORIC) 40 MG tablet Take 40 mg by mouth daily.      . Ferrous Gluconate (FERGON PO) Take 1 tablet by mouth daily.       Marland Kitchen FOLIC ACID PO 2.2 mg. 1 tab po qd      . furosemide (LASIX) 40 MG tablet Take 40 mg by mouth daily as needed.       . hydrALAZINE (APRESOLINE) 50 MG tablet take 1 tablet by mouth twice a day  60 tablet   6  . insulin aspart (NOVOLOG) 100 UNIT/ML injection Inject into the skin 3 (three) times daily before meals. 6 units breakfast, 5 units lunch, 5 units suppper       . isosorbide mononitrate (IMDUR) 60 MG 24 hr tablet Take 60 mg by mouth daily.        Marland Kitchen latanoprost (XALATAN) 0.005 % ophthalmic solution 1 drop at bedtime.      Marland Kitchen levothyroxine (SYNTHROID, LEVOTHROID) 50 MCG tablet Take 50 mcg by mouth daily.        Marland Kitchen NITROSTAT 0.4 MG SL tablet PLACE 1 TABLET UNDER THE TONGUE EVERY 5 MINUTES AS NEEDED UP TO 3 DOSES  25 tablet  3  . senna (SENOKOT) 8.6 MG tablet Take 1 tablet by mouth as needed for constipation.       No current facility-administered medications for this visit.   BP 130/56  Pulse 62  Ht 5' 4.5" (1.638 m)  Wt 162 lb (73.483 kg)  BMI 27.39 kg/m2 General:  Well developed, well nourished, in no acute distress. Neck:  Neck supple, no JVD. No masses, thyromegaly or abnormal cervical nodes. Lungs:  Clear bilaterally to auscultation and percussion.  Mildly decreased breath sounds bilaterally.  Heart:  Non-displaced PMI, chest non-tender; slightly irregular rate and rhythm, S1, S2, 2/6 early SEM.  No gallop. Right carotid bruit. No ankle edema, no varicosities.  Unable to palpate pedal pulses but feet warm.   Abdomen:  Bowel sounds positive; abdomen soft and non-tender without masses, organomegaly, or hernias noted. No hepatosplenomegaly. Extremities:  No clubbing or cyanosis. Neurologic:  Alert and oriented x 3. Vascular: Femoral pulses are not palpable bilaterally. Distal pulses are also not palpable.  Assessment/Plan: Peripheral arterial disease with severe lifestyle limiting bilateral leg claudication (Rutherford class 3) right worse than left . She likely has multilevel disease based on physical exam with significant aortoiliac involvement as well. Angiography and Endovascular intervention is limited by advanced chronic kidney disease . I requested no extremity arterial duplex and  aortoiliac duplex to see if there is approachable disease.  I agree that Pletal cannot be used due to ischemic cardiomyopathy. I strongly advised her to quit smoking and continue a walking program.  Kathlyn Sacramento 07/21/2013 2:33 PM

## 2013-08-17 NOTE — Discharge Instructions (Signed)
Arteriogram °Care After °These instructions give you information on caring for yourself after your procedure. Your doctor may also give you more specific instructions. Call your doctor if you have any problems or questions after your procedure. °HOME CARE °· Stay in bed the rest of the day. °· Keep your leg straight for at least 6 hours. °· Do not lift anything heavier than 10 pounds (about a gallon of milk) for 2 days. °· Do not walk a lot, run, or drive for 2 days. °· Return to normal activities in 2 days or as told by your doctor. °Finding out the results of your test °Ask when your test results will be ready. Make sure you get your test results. °GET HELP RIGHT AWAY IF:  °· You have fever of 102° F (38.9° C) or higher. °· You have more pain in your leg. °· The leg that was cut is: °· Bleeding. °· Puffy (swollen) or red. °· Cold. °· Pale or changes color. °· Weak. °· Tingly or numb. °If you go to the Emergency Room, tell your nurse that you have had an arteriogram. Take this paper with you to show the nurse. °MAKE SURE YOU: °· Understand these instructions. °· Will watch your condition. °· Will get help right away if you are not doing well or get worse. °Document Released: 07/18/2008 Document Revised: 07/14/2011 Document Reviewed: 07/18/2008 °[employer]® Patient Information ©2014 [employer]. ° °

## 2013-08-17 NOTE — CV Procedure (Signed)
    PERIPHERAL VASCULAR PROCEDURE  NAME:  Erica Jimenez   MRN: [id number] DOB:  Oct 01, 1929   ADMIT DATE: 08/17/2013  Performing Cardiologist: Wellington Hampshire Primary Physician: [name] Primary Cardiologist:  Dr. Aundra Dubin  Procedures Performed:  Ultrasound guided arterial access.   Abdominal Aortic Angiogram with CO2  Bilateral iliac angiography    Indication(s):   Severe Claudication   Consent: The procedure with Risks/Benefits/Alternatives and Indications was reviewed with the patient .  All questions were answered.  Medications:  Sedation:  1 mg IV Versed, 25 mcg IV Fentanyl  Contrast:  15 ml  Visipaque   Procedural details: The right groin was prepped, draped, and anesthetized with 1% lidocaine. Using modified Seldinger technique with ultrasound guidance, a 4 French micropuncture sheath was introduced into the right common femoral artery. There is severe calcification noted on ultrasound with significant difficulty advancing the sheath. The micropuncture sheath was exchanged into a 5 [place] sheath. A 5 Fr Short Pigtail Catheter was advanced of over a  Versicore wire into the descending Aorta to a level just above the renal arteries. Angiography was performed with CO2.The patient became very nauseous and was given Zofran.     The catheter was then pulled back to a level just above the Aortic bifurcation, and a power injection with 15 mL of contrast was performed to evaluate the iliac arteries. .  complications.    Hemodynamics:  Central Aortic Pressure / Mean Aortic Pressure: 169/61  Findings:  Abdominal aorta: There is mild diffuse atherosclerosis with no evidence of significant stenosis or aneurysm.  Left renal artery: Not well visualized but there seems to be sluggish flow in the left renal artery.  Right renal artery: Patent  Celiac artery: Not visualized  Superior mesenteric artery: Not visualized.  Right common iliac artery: The iliac arteries are  heavily calcified bilaterally. There is 20% disease in the ostial right common iliac artery and 40% distal stenosis at the origin of the internal iliac artery.  Right internal iliac artery: Patent with 40% ostial stenosis.  Right external iliac artery: There is 90% distal stenosis.  Right common femoral artery: Diffuse and heavily calcified 40% stenosis.  Left common iliac artery:  Heavily calcified with 50% ostial stenosis and diffuse 50% disease in the mid and distal segment.  Left internal iliac artery: Patent with 40% proximal disease.  Left external iliac artery: Heavily calcified with 90% stenosis distally extending into the left common femoral artery  Left common femoral artery: 80% stenosis.  Conclusions: 1. Heavily calcified iliac arteries with moderate bilateral common iliac artery disease. 2. Severe distal left external iliac artery stenosis extending into the left common femoral artery. 3. Severe distal right external iliac artery.  Recommendations:  The patient has severely calcified arteries. I had significant difficulty in advancing the 5 [place] sheath. The iliac arteries are diffusely diseased. The disease in the left external ilac artery extends into the common femoral artery and thus not an ideal location for stenting. The disease in the right external iliac artery is also very distal although it does not extend to the common femoral.  Based on the above, endovascular intervention would be associated with higher risk for complications and likely not able to achieve reasonable results without significant amount of contrast. Recommend medical therapy for now.   Wellington Hampshire, MD, [facility] 08/17/2013 3:50 PM

## 2013-08-17 NOTE — Interval H&P Note (Signed)
History and Physical Interval Note: non-invasive vascular studies showed severe bilateral common iliac artery stenosis with no significant infra-inguinal disease. She was brought for hydration this am and will likely keep overnight for continued hydration.   08/17/2013 2:51 PM  [place]  has presented today for surgery, with the diagnosis of pvd  The various methods of treatment have been discussed with the patient and family. After consideration of risks, benefits and other options for treatment, the patient has consented to  Procedure(s): ABDOMINAL AORTAGRAM (N/A) as a surgical intervention .  The patient's history has been reviewed, patient examined, no change in status, stable for surgery.  I have reviewed the patient's chart and labs.  Questions were answered to the patient's satisfaction.     Wellington Hampshire

## 2013-08-19 ENCOUNTER — Telehealth: Payer: Self-pay | Admitting: Cardiovascular Disease

## 2013-08-19 NOTE — Telephone Encounter (Signed)
New message     Pt had a procedure on Monday.  When can she take her bandage off

## 2013-08-19 NOTE — Telephone Encounter (Signed)
I spoke with the pt and she still has her guaze bandage on her procedure site. I made the pt aware that she can remove this today and place a bandaid on the site.  I advised the pt that she cannot take a tub bath for one week but she is okay to shower. I told the pt that if she is more comfortable with wearing a bandaid on the site then she can do this for a few days. The pt will call the office with any other questions or concerns.

## 2013-09-20 DIAGNOSIS — H4011X0 Primary open-angle glaucoma, stage unspecified: Secondary | ICD-10-CM | POA: Diagnosis not present

## 2013-09-20 DIAGNOSIS — H18419 Arcus senilis, unspecified eye: Secondary | ICD-10-CM | POA: Diagnosis not present

## 2013-09-20 DIAGNOSIS — H11159 Pinguecula, unspecified eye: Secondary | ICD-10-CM | POA: Diagnosis not present

## 2013-09-20 DIAGNOSIS — Z961 Presence of intraocular lens: Secondary | ICD-10-CM | POA: Diagnosis not present

## 2013-10-11 ENCOUNTER — Ambulatory Visit (INDEPENDENT_AMBULATORY_CARE_PROVIDER_SITE_OTHER): Payer: Medicare Other | Admitting: Cardiovascular Disease

## 2013-10-11 ENCOUNTER — Encounter: Payer: Self-pay | Admitting: Cardiovascular Disease

## 2013-10-11 VITALS — BP 122/58 | HR 46 | Ht 64.0 in | Wt 163.4 lb

## 2013-10-11 DIAGNOSIS — I739 Peripheral vascular disease, unspecified: Secondary | ICD-10-CM | POA: Diagnosis not present

## 2013-10-11 DIAGNOSIS — I2510 Atherosclerotic heart disease of native coronary artery without angina pectoris: Secondary | ICD-10-CM | POA: Diagnosis not present

## 2013-10-11 NOTE — Patient Instructions (Signed)
Your physician wants you to follow-up in: 6 MONTHS with Dr Fletcher Anon.  You will receive a reminder letter in the mail two months in advance. If you don't receive a letter, please call our office to schedule the follow-up appointment.  Your physician recommends that you continue on your current medications as directed. Please refer to the Current Medication list given to you today.

## 2013-10-11 NOTE — Progress Notes (Signed)
Patient ID: Erica Jimenez, female   DOB: Jul 31, 1929, 80 y.o.   MRN: [id number] Primary Provider:  [name]  History of Present Illness: 80 yo female who is here for a follow up visit regarding PAD. She has known history of CKD, CAD, mild ischemic CMP .  No dyspnea walking on flat ground.  She can climb a flight of steps with mild dyspnea.  She is able to walk about 50 yards before her calves tighten, right > left.  She has prolonged history of tobacco use. She reports having peripheral arterial disease for many years. When she goes to the grocery store, she typically needs to rest at least 2 times for about 5 minutes before she can finish her shopping. Recent noninvasive evaluation showed an ABI of 0.47 on the right and 0.55 on the left. I proceeded with CO2 angiography which showed: 1. Heavily calcified iliac arteries with moderate bilateral common iliac artery disease.  2. Severe distal left external iliac artery stenosis extending into the left common femoral artery.  3. Severe distal right external iliac artery.   She somehow feels that her claudication has improved. No rest pain or ulcers.     ECG:  NSR, old inferior MI, RBBB, LAFB  Labs (1/10): LDL 66, HDL 61, creatinine 2.32 Labs (10/10): creatinine 2.1 Labs (10/11): K 3.8, creatinine 2.13, K 3.8 Labs (1/12): creatinine 2.4, HCT 35.5 Labs (6/13): K 4.7, creatinine 2.2, LDL 68, HDL 47  Allergies (verified):  1)  ! Sulfa  Past Medical History: 1.  DM2 2.  Hyperlipidemia 3.  CAD:  Pt is s/p anterior MI in 1996 with stent placed in LAD.  Last myoview in our office was in 7/05 and showed apical anterior, apical, and apical inferior infarct.  Minimal peri-infarct ischemia.  EF 49%.  4. Hypothyroidism 5. Smoking: < 1ppd 6. Prior ETOH abuse 7. SMV thrombosis in 2001, coumadin x 6 mos 8. HTN 9. CVA 10. Bladder CA s/p resection and BCG treatment.  11. PAD: arterial doppler study 12/04 suggestive of > 50% bilateral SFA  stenosis.  Pt has mild claudication. No invasive evaluation given CKD and desire to avoid contrast use.  ABIs (11/12): 0.57 on right, 0.68 on left.  12. CKD:  Pt is stage III-IV.  She had a fistula placed but is not on dialysis.  She follows with Dr. Lorrene Reid for nephrology.  13. gout 14. Ischemic CMP:  echo (4/11) EF 40-45%, akinesis of the mid to apical inferoseptum, apical inferior wall, and true apex.  Mild diastolic dysfunction.  Mild-moderate AS with mean gradient 16 mmHg, mild aortic insufficiency, mild mitral regurgitation.  Echo (2/13) with EF 45-50%, mid-apical inferior and septal AK, mild AS. Not able to tolerate Coreg 12.5 mg two times a day.  15. Aortic stenosis: Mild by last echo in 2/13.   16. Carotid dopplers (3/10):  40-59% RICA.  Carotid dopplers (4/11): 40-59% RICA.  Carotid dopplers (4/12): RICA 40-59%. Carotids (2/13): 40-59% RICA.  17. Gout  Family History: Noncontributory.   Social History: Married, lives in [place] with her husband.  Her daughter lives in town.  She smokes <1/2 ppd.  She used to abuse ETOH but no longer drinks.  ROS: All systems reviewed and negative except as per HPI.    Current Outpatient Prescriptions  Medication Sig Dispense Refill  . amLODipine (NORVASC) 2.5 MG tablet Take 1 tablet (2.5 mg total) by mouth daily.  30 tablet  6  . aspirin 81 MG tablet Take 81 mg  by mouth daily.        Marland Kitchen atorvastatin (LIPITOR) 80 MG tablet Take 80 mg by mouth daily.        . bisoprolol (ZEBETA) 5 MG tablet take 1/2 tablet by mouth once daily  15 tablet  6  . brinzolamide (AZOPT) 1 % ophthalmic suspension Place 1 drop into both eyes daily.       . calcitRIOL (ROCALTROL) 0.25 MCG capsule Take 0.25 mcg by mouth 3 (three) times a week. Monday, Wednesday, Friday      . Carboxymethylcellulose Sodium (REFRESH TEARS OP) Apply 1 drop to eye 2 (two) times daily.       . clopidogrel (PLAVIX) 75 MG tablet Take 75 mg by mouth daily.        . colchicine 0.6 MG tablet Take 0.6  mg by mouth daily.      . febuxostat (ULORIC) 40 MG tablet Take 40 mg by mouth daily.      . Ferrous Gluconate (FERGON PO) Take 1 tablet by mouth daily.       Marland Kitchen FOLIC ACID PO Take 2.2 mg by mouth daily.       . furosemide (LASIX) 40 MG tablet Take 40 mg by mouth daily as needed.       . hydrALAZINE (APRESOLINE) 50 MG tablet Take 50 mg by mouth 2 (two) times daily.      . insulin aspart (NOVOLOG) 100 UNIT/ML injection Inject 6 Units into the skin 3 (three) times daily before meals.       . isosorbide mononitrate (IMDUR) 60 MG 24 hr tablet Take 60 mg by mouth daily.        Marland Kitchen latanoprost (XALATAN) 0.005 % ophthalmic solution Place 1 drop into both eyes at bedtime.       Marland Kitchen levothyroxine (SYNTHROID, LEVOTHROID) 50 MCG tablet Take 50 mcg by mouth daily.        . nitroGLYCERIN (NITROSTAT) 0.4 MG SL tablet Place 0.4 mg under the tongue every 5 (five) minutes as needed for chest pain.      Marland Kitchen senna (SENOKOT) 8.6 MG tablet Take 1 tablet by mouth as needed for constipation.       No current facility-administered medications for this visit.   BP 122/58  Pulse 46  Ht 5\' 4"  (1.626 m)  Wt 163 lb 6.4 oz (74.118 kg)  BMI 28.03 kg/m2 General:  Well developed, well nourished, in no acute distress. Neck:  Neck supple, no JVD. No masses, thyromegaly or abnormal cervical nodes. Lungs:  Clear bilaterally to auscultation and percussion.  Mildly decreased breath sounds bilaterally.  Heart:  Non-displaced PMI, chest non-tender; slightly irregular rate and rhythm, S1, S2, 2/6 early SEM.  No gallop. Right carotid bruit. No ankle edema, no varicosities.  Unable to palpate pedal pulses but feet warm.   Abdomen:  Bowel sounds positive; abdomen soft and non-tender without masses, organomegaly, or hernias noted. No hepatosplenomegaly. Extremities:  No clubbing or cyanosis. Neurologic:  Alert and oriented x 3. Vascular: Femoral pulses are not palpable bilaterally. Distal pulses are also not  palpable.  Assessment/Plan: Peripheral arterial disease with severe lifestyle limiting bilateral leg claudication (Rutherford class 3) right worse than left . She likely has multilevel disease.  Endovascular intervention is limited by advanced chronic kidney disease .  The patient has severely calcified arteries. I had significant difficulty in advancing the 5 [place] sheath. The iliac arteries are diffusely diseased. The disease in the left external ilac artery extends into the common femoral artery  and thus not an ideal location for stenting. The disease in the right external iliac artery is also very distal although it does not extend to the common femoral.  Based on the above, endovascular intervention would be associated with higher risk for complications and likely not able to achieve reasonable results without significant amount of contrast. Recommend medical therapy for now. Reserve revascularization for critical limb ischemia.  Follow up in 6 months.    Wellington Hampshire 10/11/2013 9:41 AM

## 2013-10-18 DIAGNOSIS — F17200 Nicotine dependence, unspecified, uncomplicated: Secondary | ICD-10-CM | POA: Diagnosis not present

## 2013-10-18 DIAGNOSIS — E1129 Type 2 diabetes mellitus with other diabetic kidney complication: Secondary | ICD-10-CM | POA: Diagnosis not present

## 2013-10-18 DIAGNOSIS — N184 Chronic kidney disease, stage 4 (severe): Secondary | ICD-10-CM | POA: Diagnosis not present

## 2013-10-18 DIAGNOSIS — E1159 Type 2 diabetes mellitus with other circulatory complications: Secondary | ICD-10-CM | POA: Diagnosis not present

## 2013-10-18 DIAGNOSIS — I10 Essential (primary) hypertension: Secondary | ICD-10-CM | POA: Diagnosis not present

## 2013-11-08 ENCOUNTER — Ambulatory Visit (INDEPENDENT_AMBULATORY_CARE_PROVIDER_SITE_OTHER): Payer: Medicare Other

## 2013-11-08 VITALS — BP 126/59 | HR 62 | Resp 16

## 2013-11-08 DIAGNOSIS — Q828 Other specified congenital malformations of skin: Secondary | ICD-10-CM

## 2013-11-08 DIAGNOSIS — L608 Other nail disorders: Secondary | ICD-10-CM | POA: Diagnosis not present

## 2013-11-08 DIAGNOSIS — E1149 Type 2 diabetes mellitus with other diabetic neurological complication: Secondary | ICD-10-CM | POA: Diagnosis not present

## 2013-11-08 DIAGNOSIS — I739 Peripheral vascular disease, unspecified: Secondary | ICD-10-CM

## 2013-11-08 DIAGNOSIS — E1142 Type 2 diabetes mellitus with diabetic polyneuropathy: Secondary | ICD-10-CM

## 2013-11-08 DIAGNOSIS — E1140 Type 2 diabetes mellitus with diabetic neuropathy, unspecified: Secondary | ICD-10-CM

## 2013-11-08 NOTE — Progress Notes (Signed)
   Subjective:    Patient ID: Erica Jimenez, female    DOB: June 20, 1929, 80 y.o.   MRN: [id number]  HPI Comments: My toes trimmed     Review of Systems no new findings or systemic changes noted     Objective:   Physical Exam Largely objective findings intact and unchanged DP pulse +2/4 bilateral PT thready one over 4 bilateral capillary refill time 3 seconds all digits epicritic sensations diminished on Semmes Weinstein testing to forefoot digits and arch patient also some hyperesthesia to the toes at times there is notable neuropathy painful tender toes on palpation with associated keratoses HD 5 bilateral due to hammertoe deformity and digital contracture keratotic lesions are present in the debridement at this time also criptotic incurvated friable nails 1 through 5 bilateral painful tender and symptomatic both with enclosed shoe wear and ambulation. Mild digital contractures are noted no open wounds ulcerations no secondary infection is noted at this time.       Assessment & Plan:  Assessment diabetes with history peripheral neuropathy and complications patient does have painful mycotic dystrophic brittle nails 1 through 5 bilateral which are debrided at this time return for future followup on an as-needed basis also debridement multiple keratoses HD 5 bilateral due to hammertoe deformity and digital contracture schedule 3 month followup as needed  Harriet Masson DPM

## 2013-11-08 NOTE — Patient Instructions (Signed)
Diabetes and Foot Care Diabetes may cause you to have problems because of poor blood supply (circulation) to your feet and legs. This may cause the skin on your feet to become thinner, break easier, and heal more slowly. Your skin may become dry, and the skin may peel and crack. You may also have nerve damage in your legs and feet causing decreased feeling in them. You may not notice minor injuries to your feet that could lead to infections or more serious problems. Taking care of your feet is one of the most important things you can do for yourself.  HOME CARE INSTRUCTIONS  Wear shoes at all times, even in the house. Do not go barefoot. Bare feet are easily injured.  Check your feet daily for blisters, cuts, and redness. If you cannot see the bottom of your feet, use a mirror or ask someone for help.  Wash your feet with warm water (do not use hot water) and mild soap. Then pat your feet and the areas between your toes until they are completely dry. Do not soak your feet as this can dry your skin.  Apply a moisturizing lotion or petroleum jelly (that does not contain alcohol and is unscented) to the skin on your feet and to dry, brittle toenails. Do not apply lotion between your toes.  Trim your toenails straight across. Do not dig under them or around the cuticle. File the edges of your nails with an emery board or nail file.  Do not cut corns or calluses or try to remove them with medicine.  Wear clean socks or stockings every day. Make sure they are not too tight. Do not wear knee-high stockings since they may decrease blood flow to your legs.  Wear shoes that fit properly and have enough cushioning. To break in new shoes, wear them for just a few hours a day. This prevents you from injuring your feet. Always look in your shoes before you put them on to be sure there are no objects inside.  Do not cross your legs. This may decrease the blood flow to your feet.  If you find a minor scrape,  cut, or break in the skin on your feet, keep it and the skin around it clean and dry. These areas may be cleansed with mild soap and water. Do not cleanse the area with peroxide, alcohol, or iodine.  When you remove an adhesive bandage, be sure not to damage the skin around it.  If you have a wound, look at it several times a day to make sure it is healing.  Do not use heating pads or hot water bottles. They may burn your skin. If you have lost feeling in your feet or legs, you may not know it is happening until it is too late.  Make sure your health care provider performs a complete foot exam at least annually or more often if you have foot problems. Report any cuts, sores, or bruises to your health care provider immediately. SEEK MEDICAL CARE IF:   You have an injury that is not healing.  You have cuts or breaks in the skin.  You have an ingrown nail.  You notice redness on your legs or feet.  You feel burning or tingling in your legs or feet.  You have pain or cramps in your legs and feet.  Your legs or feet are numb.  Your feet always feel cold. SEEK IMMEDIATE MEDICAL CARE IF:   There is increasing redness,   swelling, or pain in or around a wound.  There is a red line that goes up your leg.  Pus is coming from a wound.  You develop a fever or as directed by your health care provider.  You notice a bad smell coming from an ulcer or wound. Document Released: 04/18/2000 Document Revised: 12/22/2012 Document Reviewed: 09/28/2012 ExitCare Patient Information 2015 [employer]. This information is not intended to replace advice given to you by your health care provider. Make sure you discuss any questions you have with your health care provider.  

## 2013-11-23 ENCOUNTER — Ambulatory Visit (INDEPENDENT_AMBULATORY_CARE_PROVIDER_SITE_OTHER): Payer: Medicare Other | Admitting: Ophthalmology

## 2013-11-23 DIAGNOSIS — H35039 Hypertensive retinopathy, unspecified eye: Secondary | ICD-10-CM | POA: Diagnosis not present

## 2013-11-23 DIAGNOSIS — E1139 Type 2 diabetes mellitus with other diabetic ophthalmic complication: Secondary | ICD-10-CM | POA: Diagnosis not present

## 2013-11-23 DIAGNOSIS — E1165 Type 2 diabetes mellitus with hyperglycemia: Secondary | ICD-10-CM | POA: Diagnosis not present

## 2013-11-23 DIAGNOSIS — H43819 Vitreous degeneration, unspecified eye: Secondary | ICD-10-CM

## 2013-11-23 DIAGNOSIS — E11319 Type 2 diabetes mellitus with unspecified diabetic retinopathy without macular edema: Secondary | ICD-10-CM | POA: Diagnosis not present

## 2013-11-23 DIAGNOSIS — I10 Essential (primary) hypertension: Secondary | ICD-10-CM

## 2013-12-29 DIAGNOSIS — Z1231 Encounter for screening mammogram for malignant neoplasm of breast: Secondary | ICD-10-CM | POA: Diagnosis not present

## 2013-12-29 DIAGNOSIS — Z803 Family history of malignant neoplasm of breast: Secondary | ICD-10-CM | POA: Diagnosis not present

## 2013-12-30 ENCOUNTER — Encounter: Payer: Self-pay | Admitting: Cardiology

## 2013-12-30 ENCOUNTER — Ambulatory Visit (INDEPENDENT_AMBULATORY_CARE_PROVIDER_SITE_OTHER): Payer: Medicare Other | Admitting: Cardiology

## 2013-12-30 VITALS — BP 124/64 | HR 52 | Ht 64.0 in | Wt 168.0 lb

## 2013-12-30 DIAGNOSIS — F17200 Nicotine dependence, unspecified, uncomplicated: Secondary | ICD-10-CM

## 2013-12-30 DIAGNOSIS — I2510 Atherosclerotic heart disease of native coronary artery without angina pectoris: Secondary | ICD-10-CM

## 2013-12-30 DIAGNOSIS — I10 Essential (primary) hypertension: Secondary | ICD-10-CM

## 2013-12-30 DIAGNOSIS — E785 Hyperlipidemia, unspecified: Secondary | ICD-10-CM

## 2013-12-30 DIAGNOSIS — I2589 Other forms of chronic ischemic heart disease: Secondary | ICD-10-CM

## 2013-12-30 DIAGNOSIS — R0989 Other specified symptoms and signs involving the circulatory and respiratory systems: Secondary | ICD-10-CM

## 2013-12-30 DIAGNOSIS — I359 Nonrheumatic aortic valve disorder, unspecified: Secondary | ICD-10-CM

## 2013-12-30 NOTE — Progress Notes (Signed)
Patient ID: Erica Jimenez, female   DOB: 08-08-1929, 80 y.o.   MRN: [id number] Primary Provider:  [name]  History of Present Illness: 80 yo with PAD, CKD, CAD, mild ischemic CMP presents for followup to cardiology clinic.  No dyspnea walking on flat ground.  She can climb a flight of steps with mild dyspnea.  She is able to walk about 50 yards before her calves tighten, right > left, this is stable.  No rest pain and no pedal ulcers.  She is still smoking <1/2 ppd.  No chest pain.  Last echo in 3/15 showed EF 45-50%.  In 4/15, she had peripheral arterial dopplers showing severe bilateral iliac artery stenosis.  I sent her to Dr Fletcher Anon, who did peripheral angiography, this showed moderate to severe stenosis in the bilateral CIAs and EIAs.  Given the extensive nature of disease and her CKD, it was decided to manage her medically as endovascular intervention would be very difficult.    ECG:  NSR, LAFB, RBBB, rate 52  Labs (1/10): LDL 66, HDL 61, creatinine 2.32 Labs (10/10): creatinine 2.1 Labs (10/11): K 3.8, creatinine 2.13, K 3.8 Labs (1/12): creatinine 2.4, HCT 35.5 Labs (6/13): K 4.7, creatinine 2.2, LDL 68, HDL 47 Labs (3/15): LDL 57, HDL 52 Labs (4/15): K 4.2, creatinine 2.08  Allergies (verified):  1)  ! Sulfa  Past Medical History: 1.  DM2 2.  Hyperlipidemia 3.  CAD:  Pt is s/p anterior MI in 1996 with stent placed in LAD.  Last myoview in our office was in 7/05 and showed apical anterior, apical, and apical inferior infarct.  Minimal peri-infarct ischemia.  EF 49%.  4. Hypothyroidism 5. Smoking: < 1ppd 6. Prior ETOH abuse 7. SMV thrombosis in 2001, coumadin x 6 mos 8. HTN 9. CVA 10. Bladder CA s/p resection and BCG treatment.  11. PAD: arterial doppler study 12/04 suggestive of > 50% bilateral SFA stenosis.  Pt has mild claudication. No invasive evaluation given CKD and desire to avoid contrast use.  ABIs (11/12): 0.57 on right, 0.68 on left.  Peripheral arterial  dopplers (4/15) suggested severe bilateral iliac artery stenosis.  She had peripheral angiography by Dr. Fletcher Anon, showing moderate bilateral CIA stenoses and severe bilateral EIA stenoses.  It was decided to manage her medically.  12. CKD:  Pt is stage III-IV.  She had a fistula placed but is not on dialysis.  She follows with Dr. Lorrene Reid for nephrology.  13. gout 14. Ischemic CMP:  echo (4/11) EF 40-45%, akinesis of the mid to apical inferoseptum, apical inferior wall, and true apex.  Mild diastolic dysfunction.  Mild-moderate AS with mean gradient 16 mmHg, mild aortic insufficiency, mild mitral regurgitation.  Echo (2/13) with EF 45-50%, mid-apical inferior and septal AK, mild AS. Not able to tolerate Coreg 12.5 mg two times a day.  Echo (3/15) with EF 45-50%, apical septal and apical anterior akinesis, mild MR, mild AS.  15. Aortic stenosis: Mild by last echo in 3/15.   16. Carotid dopplers (3/10):  40-59% RICA.  Carotid dopplers (4/11): 40-59% RICA.  Carotid dopplers (4/12): RICA 40-59%. Carotids (2/13): 40-59% RICA. Carotids (1/75): 10-25% LICA stenosis.  17. Gout  Family History: Noncontributory.   Social History: Married, lives in [place] with her husband.  Her daughter lives in town.  She smokes <1/2 ppd.  She used to abuse ETOH but no longer drinks.  ROS: All systems reviewed and negative except as per HPI.    Current Outpatient Prescriptions  Medication  Sig Dispense Refill  . amLODipine (NORVASC) 2.5 MG tablet Take 1 tablet (2.5 mg total) by mouth daily.  30 tablet  6  . aspirin 81 MG tablet Take 81 mg by mouth daily.        Marland Kitchen atorvastatin (LIPITOR) 80 MG tablet Take 80 mg by mouth daily.        . bisoprolol (ZEBETA) 5 MG tablet take 1/2 tablet by mouth once daily  15 tablet  6  . brinzolamide (AZOPT) 1 % ophthalmic suspension Place 1 drop into both eyes daily.       . calcitRIOL (ROCALTROL) 0.25 MCG capsule Take 0.25 mcg by mouth 3 (three) times a week. Monday, Wednesday, Friday       . Carboxymethylcellulose Sodium (REFRESH TEARS OP) Apply 1 drop to eye 2 (two) times daily.       . clopidogrel (PLAVIX) 75 MG tablet Take 75 mg by mouth daily.        . colchicine 0.6 MG tablet Take 0.6 mg by mouth daily.      . febuxostat (ULORIC) 40 MG tablet Take 40 mg by mouth daily.      . Ferrous Gluconate (FERGON PO) Take 1 tablet by mouth daily.       Marland Kitchen FOLIC ACID PO Take 2.2 mg by mouth daily.       . furosemide (LASIX) 40 MG tablet Take 40 mg by mouth daily as needed.       . hydrALAZINE (APRESOLINE) 50 MG tablet Take 50 mg by mouth 2 (two) times daily.      . insulin aspart (NOVOLOG) 100 UNIT/ML injection Inject into the skin 3 (three) times daily before meals. Sliding Scale      . isosorbide mononitrate (IMDUR) 60 MG 24 hr tablet Take 60 mg by mouth daily.        Marland Kitchen latanoprost (XALATAN) 0.005 % ophthalmic solution Place 1 drop into both eyes at bedtime.       Marland Kitchen levothyroxine (SYNTHROID, LEVOTHROID) 50 MCG tablet Take 50 mcg by mouth daily.        . nitroGLYCERIN (NITROSTAT) 0.4 MG SL tablet Place 0.4 mg under the tongue every 5 (five) minutes as needed for chest pain.      Marland Kitchen senna (SENOKOT) 8.6 MG tablet Take 1 tablet by mouth as needed for constipation.       No current facility-administered medications for this visit.   BP 124/64  Pulse 52  Ht 5\' 4"  (1.626 m)  Wt 168 lb (76.204 kg)  BMI 28.82 kg/m2 General:  Well developed, well nourished, in no acute distress. Neck:  Neck supple, no JVD. No masses, thyromegaly or abnormal cervical nodes. Lungs:  Clear bilaterally to auscultation and percussion.  Mildly decreased breath sounds bilaterally.  Heart:  Non-displaced PMI, chest non-tender; slightly irregular rate and rhythm, S1, S2, 2/6 early SEM.  No gallop. Right carotid bruit. No ankle edema, no varicosities.  Unable to palpate pedal pulses but feet warm.   Abdomen:  Bowel sounds positive; abdomen soft and non-tender without masses, organomegaly, or hernias noted. No  hepatosplenomegaly. Extremities:  No clubbing or cyanosis. Neurologic:  Alert and oriented x 3.  Assessment/Plan: 1. CAD: No ischemic symptoms.  She will continue ASA 81, statin.  Will continue Plavix long-term given CAD and PAD with no bleeding sequelae.  2. Ischemic cardiomyopathy: NYHA class II symptoms.  EF 45-50% on 3/15 echo. She is not volume overloaded on exam.  She has Lasix to use as needed  but has not been using it.  Continue bisoprolol and hydralazine/Imdur at current doses. She is not on ACEI or spironolactone due to CKD. 3. PAD: Stable calf claudication.  No rest pain or foot ulcerations.  Not good cilostazol candidate with history of CHF.  Will continue ASA, Plavix, statin.  She had peripheral angiography by Dr. Fletcher Anon, showing extensive disease.  She was not a very good candidate for endovascular intervention.  Therefore, it was decided to treat her medically unless she were to develop critical limb ischemia.   4. Carotid stenosis: Stable stenosis on 2/15 dopplers, repeat in 2/16.  5. Hyperlipidemia: Good lipids in 3/15.   6. Aortic stenosis: Mild AS on last echo.  7. CKD: To get BMET next week at nephrology office, will ask for a copy.   8. Smoking: I again encouraged her to quit smoking.   Loralie Champagne 12/30/2013 4:03 PM

## 2013-12-30 NOTE — Patient Instructions (Signed)
Your physician wants you to follow-up in: 6 months with Dr Aundra Dubin. (February 2016).  You will receive a reminder letter in the mail two months in advance. If you don't receive a letter, please call our office to schedule the follow-up appointment.

## 2014-01-02 ENCOUNTER — Other Ambulatory Visit: Payer: Self-pay | Admitting: Cardiology

## 2014-01-03 DIAGNOSIS — E785 Hyperlipidemia, unspecified: Secondary | ICD-10-CM | POA: Diagnosis not present

## 2014-01-03 DIAGNOSIS — N184 Chronic kidney disease, stage 4 (severe): Secondary | ICD-10-CM | POA: Diagnosis not present

## 2014-01-03 DIAGNOSIS — M109 Gout, unspecified: Secondary | ICD-10-CM | POA: Diagnosis not present

## 2014-01-03 DIAGNOSIS — D631 Anemia in chronic kidney disease: Secondary | ICD-10-CM | POA: Diagnosis not present

## 2014-01-03 DIAGNOSIS — I129 Hypertensive chronic kidney disease with stage 1 through stage 4 chronic kidney disease, or unspecified chronic kidney disease: Secondary | ICD-10-CM | POA: Diagnosis not present

## 2014-01-03 DIAGNOSIS — E039 Hypothyroidism, unspecified: Secondary | ICD-10-CM | POA: Diagnosis not present

## 2014-01-03 DIAGNOSIS — N2581 Secondary hyperparathyroidism of renal origin: Secondary | ICD-10-CM | POA: Diagnosis not present

## 2014-01-03 DIAGNOSIS — N039 Chronic nephritic syndrome with unspecified morphologic changes: Secondary | ICD-10-CM | POA: Diagnosis not present

## 2014-01-04 ENCOUNTER — Other Ambulatory Visit: Payer: Self-pay | Admitting: Cardiology

## 2014-01-17 DIAGNOSIS — F17200 Nicotine dependence, unspecified, uncomplicated: Secondary | ICD-10-CM | POA: Diagnosis not present

## 2014-01-17 DIAGNOSIS — Z6829 Body mass index (BMI) 29.0-29.9, adult: Secondary | ICD-10-CM | POA: Diagnosis not present

## 2014-01-17 DIAGNOSIS — N184 Chronic kidney disease, stage 4 (severe): Secondary | ICD-10-CM | POA: Diagnosis not present

## 2014-01-17 DIAGNOSIS — I10 Essential (primary) hypertension: Secondary | ICD-10-CM | POA: Diagnosis not present

## 2014-01-17 DIAGNOSIS — E1129 Type 2 diabetes mellitus with other diabetic kidney complication: Secondary | ICD-10-CM | POA: Diagnosis not present

## 2014-01-24 DIAGNOSIS — H4011X0 Primary open-angle glaucoma, stage unspecified: Secondary | ICD-10-CM | POA: Diagnosis not present

## 2014-01-24 DIAGNOSIS — H409 Unspecified glaucoma: Secondary | ICD-10-CM | POA: Diagnosis not present

## 2014-01-24 DIAGNOSIS — H18419 Arcus senilis, unspecified eye: Secondary | ICD-10-CM | POA: Diagnosis not present

## 2014-01-24 DIAGNOSIS — H04129 Dry eye syndrome of unspecified lacrimal gland: Secondary | ICD-10-CM | POA: Diagnosis not present

## 2014-01-24 DIAGNOSIS — H11159 Pinguecula, unspecified eye: Secondary | ICD-10-CM | POA: Diagnosis not present

## 2014-01-24 DIAGNOSIS — E119 Type 2 diabetes mellitus without complications: Secondary | ICD-10-CM | POA: Diagnosis not present

## 2014-01-24 DIAGNOSIS — S058X9A Other injuries of unspecified eye and orbit, initial encounter: Secondary | ICD-10-CM | POA: Diagnosis not present

## 2014-01-27 DIAGNOSIS — J438 Other emphysema: Secondary | ICD-10-CM | POA: Diagnosis not present

## 2014-01-27 DIAGNOSIS — Z23 Encounter for immunization: Secondary | ICD-10-CM | POA: Diagnosis not present

## 2014-01-27 DIAGNOSIS — I739 Peripheral vascular disease, unspecified: Secondary | ICD-10-CM | POA: Diagnosis not present

## 2014-01-27 DIAGNOSIS — E1159 Type 2 diabetes mellitus with other circulatory complications: Secondary | ICD-10-CM | POA: Diagnosis not present

## 2014-01-27 DIAGNOSIS — I10 Essential (primary) hypertension: Secondary | ICD-10-CM | POA: Diagnosis not present

## 2014-01-27 DIAGNOSIS — F17200 Nicotine dependence, unspecified, uncomplicated: Secondary | ICD-10-CM | POA: Diagnosis not present

## 2014-01-27 DIAGNOSIS — Z6828 Body mass index (BMI) 28.0-28.9, adult: Secondary | ICD-10-CM | POA: Diagnosis not present

## 2014-02-13 ENCOUNTER — Other Ambulatory Visit: Payer: Self-pay | Admitting: Cardiology

## 2014-02-14 ENCOUNTER — Ambulatory Visit (INDEPENDENT_AMBULATORY_CARE_PROVIDER_SITE_OTHER): Payer: Medicare Other

## 2014-02-14 DIAGNOSIS — B351 Tinea unguium: Secondary | ICD-10-CM

## 2014-02-14 DIAGNOSIS — M79673 Pain in unspecified foot: Secondary | ICD-10-CM | POA: Diagnosis not present

## 2014-02-14 DIAGNOSIS — E1140 Type 2 diabetes mellitus with diabetic neuropathy, unspecified: Secondary | ICD-10-CM

## 2014-02-14 NOTE — Progress Notes (Signed)
   Subjective:    Patient ID: Erica Jimenez, female    DOB: 06-24-1929, 80 y.o.   MRN: [id number]  HPI  Pt presents for nail debridement  Review of Systems no new findings or systemic changes noted     Objective:   Physical Exam Pedal pulses are diminished DP and PT one over 4 bilateral Neurovascular status intact pedal pulses palpable epicritic and proprioceptive sensations intact and unchanged there is decreased sensation Semmes Weinstein to the forefoot and digits. No new findings noted nails thick brittle crumbly friable dystrophic painful tender both on palpation with enclosed shoe wear.     Assessment & Plan:  Assessment this time is cardiovascular disease complications of vascular compromise . Nails thick brittle crumbly friable dystrophic painful tender symptomatic 1 through 5 right 2 through 5 left no other new findings or changes noted no severe keratoses no open wounds or ulcers noted at this time to recheck in 3 months for followup and continued palliative care in the future as needed next  Harriet Masson DPM

## 2014-03-29 DIAGNOSIS — Z6828 Body mass index (BMI) 28.0-28.9, adult: Secondary | ICD-10-CM | POA: Diagnosis not present

## 2014-03-29 DIAGNOSIS — M79672 Pain in left foot: Secondary | ICD-10-CM | POA: Diagnosis not present

## 2014-03-29 DIAGNOSIS — M109 Gout, unspecified: Secondary | ICD-10-CM | POA: Diagnosis not present

## 2014-04-13 ENCOUNTER — Encounter (HOSPITAL_COMMUNITY): Payer: Self-pay | Admitting: Cardiovascular Disease

## 2014-04-18 ENCOUNTER — Telehealth: Payer: Self-pay | Admitting: Cardiology

## 2014-04-18 ENCOUNTER — Ambulatory Visit: Payer: Medicare Other

## 2014-04-18 DIAGNOSIS — Z6827 Body mass index (BMI) 27.0-27.9, adult: Secondary | ICD-10-CM | POA: Diagnosis not present

## 2014-04-18 DIAGNOSIS — I10 Essential (primary) hypertension: Secondary | ICD-10-CM | POA: Diagnosis not present

## 2014-04-18 DIAGNOSIS — E1151 Type 2 diabetes mellitus with diabetic peripheral angiopathy without gangrene: Secondary | ICD-10-CM | POA: Diagnosis not present

## 2014-04-18 DIAGNOSIS — E1129 Type 2 diabetes mellitus with other diabetic kidney complication: Secondary | ICD-10-CM | POA: Diagnosis not present

## 2014-04-18 DIAGNOSIS — N184 Chronic kidney disease, stage 4 (severe): Secondary | ICD-10-CM | POA: Diagnosis not present

## 2014-04-18 DIAGNOSIS — F17200 Nicotine dependence, unspecified, uncomplicated: Secondary | ICD-10-CM | POA: Diagnosis not present

## 2014-04-18 NOTE — Telephone Encounter (Signed)
Patient known to have PAD. States pain is getting worse, left moreso than right. Has an appointment with you in January. Taking tylenol only for pain. Any other suggestions?

## 2014-04-18 NOTE — Telephone Encounter (Signed)
New Prob    Pt reports bilateral pain to lower extremities; L greater than R that is worsened with walking. Requesting to speak to a nurse.

## 2014-04-19 NOTE — Telephone Encounter (Signed)
She needs office evaluation.

## 2014-04-19 NOTE — Telephone Encounter (Signed)
Patient was offered an appointment with Truitt Merle on Friday 12/18, but declined because she had other plans Will just keep appointment in Jan  With dr Fletcher Anon

## 2014-04-21 ENCOUNTER — Ambulatory Visit: Payer: Medicare Other | Admitting: Nurse Practitioner

## 2014-04-21 DIAGNOSIS — E1351 Other specified diabetes mellitus with diabetic peripheral angiopathy without gangrene: Secondary | ICD-10-CM | POA: Diagnosis not present

## 2014-04-21 DIAGNOSIS — L84 Corns and callosities: Secondary | ICD-10-CM | POA: Diagnosis not present

## 2014-04-21 DIAGNOSIS — L602 Onychogryphosis: Secondary | ICD-10-CM | POA: Diagnosis not present

## 2014-04-21 DIAGNOSIS — M79672 Pain in left foot: Secondary | ICD-10-CM | POA: Diagnosis not present

## 2014-05-01 ENCOUNTER — Other Ambulatory Visit: Payer: Self-pay | Admitting: Cardiology

## 2014-05-03 ENCOUNTER — Other Ambulatory Visit: Payer: Self-pay | Admitting: Cardiology

## 2014-05-08 DIAGNOSIS — E1151 Type 2 diabetes mellitus with diabetic peripheral angiopathy without gangrene: Secondary | ICD-10-CM | POA: Diagnosis not present

## 2014-05-08 DIAGNOSIS — I739 Peripheral vascular disease, unspecified: Secondary | ICD-10-CM | POA: Diagnosis not present

## 2014-05-08 DIAGNOSIS — N184 Chronic kidney disease, stage 4 (severe): Secondary | ICD-10-CM | POA: Diagnosis not present

## 2014-05-08 DIAGNOSIS — Z6828 Body mass index (BMI) 28.0-28.9, adult: Secondary | ICD-10-CM | POA: Diagnosis not present

## 2014-05-08 DIAGNOSIS — J439 Emphysema, unspecified: Secondary | ICD-10-CM | POA: Diagnosis not present

## 2014-05-08 DIAGNOSIS — Z1389 Encounter for screening for other disorder: Secondary | ICD-10-CM | POA: Diagnosis not present

## 2014-05-08 DIAGNOSIS — F329 Major depressive disorder, single episode, unspecified: Secondary | ICD-10-CM | POA: Diagnosis not present

## 2014-05-09 DIAGNOSIS — Z9849 Cataract extraction status, unspecified eye: Secondary | ICD-10-CM | POA: Diagnosis not present

## 2014-05-09 DIAGNOSIS — H04123 Dry eye syndrome of bilateral lacrimal glands: Secondary | ICD-10-CM | POA: Diagnosis not present

## 2014-05-09 DIAGNOSIS — H11153 Pinguecula, bilateral: Secondary | ICD-10-CM | POA: Diagnosis not present

## 2014-05-09 DIAGNOSIS — H18413 Arcus senilis, bilateral: Secondary | ICD-10-CM | POA: Diagnosis not present

## 2014-05-09 DIAGNOSIS — Z961 Presence of intraocular lens: Secondary | ICD-10-CM | POA: Diagnosis not present

## 2014-05-09 DIAGNOSIS — H4011X2 Primary open-angle glaucoma, moderate stage: Secondary | ICD-10-CM | POA: Diagnosis not present

## 2014-05-09 DIAGNOSIS — E119 Type 2 diabetes mellitus without complications: Secondary | ICD-10-CM | POA: Diagnosis not present

## 2014-05-12 DIAGNOSIS — E11329 Type 2 diabetes mellitus with mild nonproliferative diabetic retinopathy without macular edema: Secondary | ICD-10-CM | POA: Diagnosis not present

## 2014-05-12 DIAGNOSIS — H43811 Vitreous degeneration, right eye: Secondary | ICD-10-CM | POA: Diagnosis not present

## 2014-05-12 DIAGNOSIS — H4011X1 Primary open-angle glaucoma, mild stage: Secondary | ICD-10-CM | POA: Diagnosis not present

## 2014-05-12 DIAGNOSIS — Z961 Presence of intraocular lens: Secondary | ICD-10-CM | POA: Diagnosis not present

## 2014-05-16 ENCOUNTER — Ambulatory Visit (INDEPENDENT_AMBULATORY_CARE_PROVIDER_SITE_OTHER): Payer: Medicare Other | Admitting: Cardiovascular Disease

## 2014-05-16 ENCOUNTER — Encounter: Payer: Self-pay | Admitting: Cardiovascular Disease

## 2014-05-16 VITALS — BP 144/60 | HR 55 | Ht 64.0 in | Wt 159.0 lb

## 2014-05-16 DIAGNOSIS — I739 Peripheral vascular disease, unspecified: Secondary | ICD-10-CM | POA: Diagnosis not present

## 2014-05-16 NOTE — Progress Notes (Signed)
Patient ID: Erica Jimenez, female   DOB: Sep 22, 1929, 81 y.o.   MRN: [id number] Primary Provider:  [name]  History of Present Illness: 81 yo female who is here for a follow up visit regarding PAD. She has known history of CKD, CAD, mild ischemic CMP .  No dyspnea walking on flat ground.  She can climb a flight of steps with mild dyspnea.  She is able to walk about 50 yards before her calves tighten, right > left.  She has prolonged history of tobacco use. noninvasive evaluation in 08/2013 showed an ABI of 0.47 on the right and 0.55 on the left. I proceeded with CO2 angiography which showed: 1. Heavily calcified iliac arteries with moderate bilateral common iliac artery disease.  2. Severe distal left external iliac artery stenosis extending into the left common femoral artery.  3. Severe distal right external iliac artery.   She reports stable symptoms. No rest pain, wounds or ulceration.      ECG:  NSR, old inferior MI, RBBB, LAFB  Labs (1/10): LDL 66, HDL 61, creatinine 2.32 Labs (10/10): creatinine 2.1 Labs (10/11): K 3.8, creatinine 2.13, K 3.8 Labs (1/12): creatinine 2.4, HCT 35.5 Labs (6/13): K 4.7, creatinine 2.2, LDL 68, HDL 47  Allergies (verified):  1)  ! Sulfa  Past Medical History: 1.  DM2 2.  Hyperlipidemia 3.  CAD:  Pt is s/p anterior MI in 1996 with stent placed in LAD.  Last myoview in our office was in 7/05 and showed apical anterior, apical, and apical inferior infarct.  Minimal peri-infarct ischemia.  EF 49%.  4. Hypothyroidism 5. Smoking: < 1ppd 6. Prior ETOH abuse 7. SMV thrombosis in 2001, coumadin x 6 mos 8. HTN 9. CVA 10. Bladder CA s/p resection and BCG treatment.  11. PAD: arterial doppler study 12/04 suggestive of > 50% bilateral SFA stenosis.  Pt has mild claudication. No invasive evaluation given CKD and desire to avoid contrast use.  ABIs (11/12): 0.57 on right, 0.68 on left.  12. CKD:  Pt is stage III-IV.  She had a fistula placed but is not  on dialysis.  She follows with Dr. Lorrene Reid for nephrology.  13. gout 14. Ischemic CMP:  echo (4/11) EF 40-45%, akinesis of the mid to apical inferoseptum, apical inferior wall, and true apex.  Mild diastolic dysfunction.  Mild-moderate AS with mean gradient 16 mmHg, mild aortic insufficiency, mild mitral regurgitation.  Echo (2/13) with EF 45-50%, mid-apical inferior and septal AK, mild AS. Not able to tolerate Coreg 12.5 mg two times a day.  15. Aortic stenosis: Mild by last echo in 2/13.   16. Carotid dopplers (3/10):  40-59% RICA.  Carotid dopplers (4/11): 40-59% RICA.  Carotid dopplers (4/12): RICA 40-59%. Carotids (2/13): 40-59% RICA.  17. Gout  Family History: Noncontributory.   Social History: Married, lives in [place] with her husband.  Her daughter lives in town.  She smokes <1/2 ppd.  She used to abuse ETOH but no longer drinks.  ROS: All systems reviewed and negative except as per HPI.    Current Outpatient Prescriptions  Medication Sig Dispense Refill  . amLODipine (NORVASC) 2.5 MG tablet take 1 tablet by mouth once daily 30 tablet 6  . aspirin 81 MG tablet Take 81 mg by mouth daily.      Marland Kitchen atorvastatin (LIPITOR) 80 MG tablet Take 80 mg by mouth daily.      . bisoprolol (ZEBETA) 5 MG tablet take 1/2 tablet by mouth once daily 15 tablet  6  . brinzolamide (AZOPT) 1 % ophthalmic suspension Place 1 drop into both eyes daily.     . calcitRIOL (ROCALTROL) 0.25 MCG capsule Take 0.25 mcg by mouth 3 (three) times a week. Monday, Wednesday, Friday    . Carboxymethylcellulose Sodium (REFRESH TEARS OP) Apply 1 drop to eye 2 (two) times daily. Both eyes.    Marland Kitchen clopidogrel (PLAVIX) 75 MG tablet Take 75 mg by mouth daily.      . colchicine 0.6 MG tablet Take 0.6 mg by mouth daily.    . febuxostat (ULORIC) 40 MG tablet Take 40 mg by mouth daily.    . Ferrous Gluconate (FERGON PO) Take 1 tablet by mouth daily.     Marland Kitchen FLUoxetine (PROZAC) 10 MG tablet Take 10 mg by mouth daily.   0  . FOLIC  ACID PO Take 2.2 mg by mouth daily.     . furosemide (LASIX) 40 MG tablet Take 40 mg by mouth daily as needed.     . hydrALAZINE (APRESOLINE) 50 MG tablet Take 50 mg by mouth 2 (two) times daily.    . insulin aspart (NOVOLOG) 100 UNIT/ML injection Inject 6 Units into the skin 3 (three) times daily before meals. Sliding Scale    . isosorbide mononitrate (IMDUR) 60 MG 24 hr tablet Take 60 mg by mouth daily.      Marland Kitchen latanoprost (XALATAN) 0.005 % ophthalmic solution Place 1 drop into both eyes at bedtime.     Marland Kitchen levothyroxine (SYNTHROID, LEVOTHROID) 50 MCG tablet Take 50 mcg by mouth daily.      . nitroGLYCERIN (NITROSTAT) 0.4 MG SL tablet Place 0.4 mg under the tongue every 5 (five) minutes as needed for chest pain.    Marland Kitchen senna (SENOKOT) 8.6 MG tablet Take 1 tablet by mouth as needed for constipation.     No current facility-administered medications for this visit.   BP 144/60 mmHg  Pulse 55  Ht 5\' 4"  (1.626 m)  Wt 159 lb (72.122 kg)  BMI 27.28 kg/m2 General:  Well developed, well nourished, in no acute distress. Neck:  Neck supple, no JVD. No masses, thyromegaly or abnormal cervical nodes. Lungs:  Clear bilaterally to auscultation and percussion.  Mildly decreased breath sounds bilaterally.  Heart:  Non-displaced PMI, chest non-tender; slightly irregular rate and rhythm, S1, S2, 2/6 early SEM.  No gallop. Right carotid bruit. No ankle edema, no varicosities.  Unable to palpate pedal pulses but feet warm.   Abdomen:  Bowel sounds positive; abdomen soft and non-tender without masses, organomegaly, or hernias noted. No hepatosplenomegaly. Extremities:  No clubbing or cyanosis. Neurologic:  Alert and oriented x 3. Vascular: Femoral pulses are not palpable bilaterally. Distal pulses are also not palpable.  Assessment/Plan: Peripheral arterial disease with severe lifestyle limiting bilateral leg claudication (Rutherford class 3) right worse than left .Endovascular intervention is limited by advanced  chronic kidney disease and heavy calcifications .  continue medical therapy for now. Reserve revascularization for critical limb ischemia.  Follow up in 6 months.    Kathlyn Sacramento 05/16/2014 8:55 AM

## 2014-05-16 NOTE — Patient Instructions (Signed)
Your physician wants you to follow-up in: 6 MONTHS with Dr Fletcher Anon. You will receive a reminder letter in the mail two months in advance. If you don't receive a letter, please call our office to schedule the follow-up appointment.  Your physician recommends that you continue on your current medications as directed. Please refer to the Current Medication list given to you today.

## 2014-05-19 DIAGNOSIS — H4011X1 Primary open-angle glaucoma, mild stage: Secondary | ICD-10-CM | POA: Diagnosis not present

## 2014-05-29 ENCOUNTER — Ambulatory Visit (INDEPENDENT_AMBULATORY_CARE_PROVIDER_SITE_OTHER): Payer: Medicare Other | Admitting: Ophthalmology

## 2014-06-02 DIAGNOSIS — H4011X1 Primary open-angle glaucoma, mild stage: Secondary | ICD-10-CM | POA: Diagnosis not present

## 2014-06-23 ENCOUNTER — Other Ambulatory Visit (HOSPITAL_COMMUNITY): Payer: Self-pay | Admitting: Cardiology

## 2014-06-23 DIAGNOSIS — I6523 Occlusion and stenosis of bilateral carotid arteries: Secondary | ICD-10-CM

## 2014-06-23 DIAGNOSIS — E1151 Type 2 diabetes mellitus with diabetic peripheral angiopathy without gangrene: Secondary | ICD-10-CM | POA: Diagnosis not present

## 2014-06-23 DIAGNOSIS — L602 Onychogryphosis: Secondary | ICD-10-CM | POA: Diagnosis not present

## 2014-06-26 DIAGNOSIS — I7389 Other specified peripheral vascular diseases: Secondary | ICD-10-CM | POA: Diagnosis not present

## 2014-06-29 ENCOUNTER — Encounter: Payer: Self-pay | Admitting: Cardiology

## 2014-06-29 ENCOUNTER — Ambulatory Visit (INDEPENDENT_AMBULATORY_CARE_PROVIDER_SITE_OTHER): Payer: Medicare Other | Admitting: Cardiology

## 2014-06-29 ENCOUNTER — Ambulatory Visit (HOSPITAL_COMMUNITY): Payer: Medicare Other | Attending: Cardiology | Admitting: Cardiology

## 2014-06-29 VITALS — BP 142/76 | HR 59 | Ht 64.0 in | Wt 158.0 lb

## 2014-06-29 DIAGNOSIS — I6523 Occlusion and stenosis of bilateral carotid arteries: Secondary | ICD-10-CM

## 2014-06-29 DIAGNOSIS — I359 Nonrheumatic aortic valve disorder, unspecified: Secondary | ICD-10-CM

## 2014-06-29 DIAGNOSIS — I2510 Atherosclerotic heart disease of native coronary artery without angina pectoris: Secondary | ICD-10-CM

## 2014-06-29 DIAGNOSIS — Z720 Tobacco use: Secondary | ICD-10-CM

## 2014-06-29 DIAGNOSIS — I739 Peripheral vascular disease, unspecified: Secondary | ICD-10-CM | POA: Diagnosis not present

## 2014-06-29 DIAGNOSIS — E785 Hyperlipidemia, unspecified: Secondary | ICD-10-CM | POA: Diagnosis not present

## 2014-06-29 DIAGNOSIS — F17200 Nicotine dependence, unspecified, uncomplicated: Secondary | ICD-10-CM

## 2014-06-29 MED ORDER — AMLODIPINE BESYLATE 5 MG PO TABS: 5.0000 mg | ORAL_TABLET | Freq: Every day | ORAL | Status: DC

## 2014-06-29 NOTE — Progress Notes (Signed)
Carotid duplex performed 

## 2014-06-29 NOTE — Patient Instructions (Signed)
Increase amlodipine to 5mg  daily.   Your physician recommends that you have a  lipid profile /BMET today.  Your physician wants you to follow-up in: 6 months with Dr Aundra Dubin. (August 2016).  You will receive a reminder letter in the mail two months in advance. If you don't receive a letter, please call our office to schedule the follow-up appointment.

## 2014-06-30 LAB — LIPID PANEL
CHOL/HDL RATIO: 3
CHOLESTEROL: 144 mg/dL (ref 0–200)
HDL: 53.7 mg/dL (ref >39.00)
LDL Cholesterol: 64 mg/dL (ref 0–99)
NonHDL: 90.3
TRIGLYCERIDES: 133 mg/dL (ref 0.0–149.0)
VLDL: 26.6 mg/dL (ref 0.0–40.0)

## 2014-06-30 LAB — BASIC METABOLIC PANEL
BUN: 45 mg/dL — ABNORMAL HIGH (ref 6–23)
CHLORIDE: 109 meq/L (ref 96–112)
CO2: 24 mEq/L (ref 19–32)
Calcium: 10.3 mg/dL (ref 8.4–10.5)
Creatinine, Ser: 2.32 mg/dL — ABNORMAL HIGH (ref 0.40–1.20)
GFR: 25.81 mL/min — ABNORMAL LOW (ref >60.00)
GLUCOSE: 88 mg/dL (ref 70–99)
POTASSIUM: 4.5 meq/L (ref 3.5–5.1)
Sodium: 140 mEq/L (ref 135–145)

## 2014-07-01 NOTE — Progress Notes (Signed)
Patient ID: Erica Jimenez, female   DOB: March 18, 1930, 81 y.o.   MRN: [id number] Primary Provider:  [name]  History of Present Illness: 81 yo with PAD, CKD, CAD, mild ischemic CMP presents for followup to cardiology clinic.  No dyspnea walking on flat ground.  She can climb a flight of steps with mild dyspnea.   She is still smoking <1/2 ppd.  No chest pain.  Last echo in 3/15 showed EF 45-50%.  BP is running high.  Weight is down.   In 4/15, she had peripheral arterial dopplers showing severe bilateral iliac artery stenosis.  I sent her to Dr Fletcher Anon, who did peripheral angiography, this showed moderate to severe stenosis in the bilateral CIAs and EIAs.  Given the extensive nature of disease and her CKD, it was decided to manage her medically as endovascular intervention would be very difficult.  She continues to have left > right thigh cramps walking around in stores.  This is better if she uses a walker.  Her thighs are weak and it is sometimes hard for her to stand up. No rest pain or pedal ulcers.   ECG:  NSR, LAFB, RBBB, old inferior MI  Labs (1/10): LDL 66, HDL 61, creatinine 2.32 Labs (10/10): creatinine 2.1 Labs (10/11): K 3.8, creatinine 2.13, K 3.8 Labs (1/12): creatinine 2.4, HCT 35.5 Labs (6/13): K 4.7, creatinine 2.2, LDL 68, HDL 47 Labs (3/15): LDL 57, HDL 52 Labs (4/15): K 4.2, creatinine 2.08  Allergies (verified):  1)  ! Sulfa  Past Medical History: 1.  DM2 2.  Hyperlipidemia 3.  CAD:  Pt is s/p anterior MI in 1996 with stent placed in LAD.  Last myoview in our office was in 7/05 and showed apical anterior, apical, and apical inferior infarct.  Minimal peri-infarct ischemia.  EF 49%.  4. Hypothyroidism 5. Smoking: < 1ppd 6. Prior ETOH abuse 7. SMV thrombosis in 2001, coumadin x 6 mos 8. HTN 9. CVA 10. Bladder CA s/p resection and BCG treatment.  11. PAD: arterial doppler study 12/04 suggestive of > 50% bilateral SFA stenosis.  Pt has mild claudication. No  invasive evaluation given CKD and desire to avoid contrast use.  ABIs (11/12): 0.57 on right, 0.68 on left.  Peripheral arterial dopplers (4/15) suggested severe bilateral iliac artery stenosis.  She had peripheral angiography by Dr. Fletcher Anon, showing moderate bilateral CIA stenoses and severe bilateral EIA stenoses.  It was decided to manage her medically.  12. CKD:  Pt is stage III-IV.  She had a fistula placed but is not on dialysis.  She follows with Dr. Lorrene Reid for nephrology.  13. gout 14. Ischemic CMP:  echo (4/11) EF 40-45%, akinesis of the mid to apical inferoseptum, apical inferior wall, and true apex.  Mild diastolic dysfunction.  Mild-moderate AS with mean gradient 16 mmHg, mild aortic insufficiency, mild mitral regurgitation.  Echo (2/13) with EF 45-50%, mid-apical inferior and septal AK, mild AS. Not able to tolerate Coreg 12.5 mg two times a day.  Echo (3/15) with EF 45-50%, apical septal and apical anterior akinesis, mild MR, mild AS.  15. Aortic stenosis: Mild by last echo in 3/15.   16. Carotid dopplers (3/10):  40-59% RICA.  Carotid dopplers (4/11): 40-59% RICA.  Carotid dopplers (4/12): RICA 40-59%. Carotids (2/13): 40-59% RICA. Carotids (7/10): 62-69% LICA stenosis.  Carotids (2/16) with 40-59% RICA stenosis.  17. Gout  Family History: Noncontributory.   Social History: Married, lives in [place] with her husband.  Her daughter lives in  town.  She smokes <1/2 ppd.  She used to abuse ETOH but no longer drinks.  ROS: All systems reviewed and negative except as per HPI.    Current Outpatient Prescriptions  Medication Sig Dispense Refill  . amLODipine (NORVASC) 5 MG tablet Take 1 tablet (5 mg total) by mouth daily. 180 tablet 3  . aspirin 81 MG tablet Take 81 mg by mouth daily.      Marland Kitchen atorvastatin (LIPITOR) 80 MG tablet Take 80 mg by mouth daily.      . bisoprolol (ZEBETA) 5 MG tablet take 1/2 tablet by mouth once daily 15 tablet 6  . brinzolamide (AZOPT) 1 % ophthalmic  suspension Place 1 drop into both eyes daily.     . calcitRIOL (ROCALTROL) 0.25 MCG capsule Take 0.25 mcg by mouth 3 (three) times a week. Monday, Wednesday, Friday    . Carboxymethylcellulose Sodium (REFRESH TEARS OP) Apply 1 drop to eye 2 (two) times daily. Both eyes.    Marland Kitchen clopidogrel (PLAVIX) 75 MG tablet Take 75 mg by mouth daily.      . febuxostat (ULORIC) 40 MG tablet Take 40 mg by mouth daily.    . Ferrous Gluconate (FERGON PO) Take 1 tablet by mouth daily.     Marland Kitchen FOLIC ACID PO Take 2.2 mg by mouth daily.     . furosemide (LASIX) 40 MG tablet Take 40 mg by mouth daily as needed.     . hydrALAZINE (APRESOLINE) 50 MG tablet Take 50 mg by mouth 2 (two) times daily.    . insulin aspart (NOVOLOG) 100 UNIT/ML injection Inject into the skin 3 (three) times daily before meals. Sliding Scale 6-8-5    . isosorbide mononitrate (IMDUR) 60 MG 24 hr tablet Take 60 mg by mouth daily.      Marland Kitchen latanoprost (XALATAN) 0.005 % ophthalmic solution Place 1 drop into both eyes at bedtime.     Marland Kitchen levothyroxine (SYNTHROID, LEVOTHROID) 50 MCG tablet Take 50 mcg by mouth daily.      . nitroGLYCERIN (NITROSTAT) 0.4 MG SL tablet Place 0.4 mg under the tongue every 5 (five) minutes as needed for chest pain.    Marland Kitchen senna (SENOKOT) 8.6 MG tablet Take 1 tablet by mouth as needed for constipation.     No current facility-administered medications for this visit.   BP 142/76 mmHg  Pulse 59  Ht 5\' 4"  (1.626 m)  Wt 158 lb (71.668 kg)  BMI 27.11 kg/m2 General:  Well developed, well nourished, in no acute distress. Neck:  Neck supple, no JVD. No masses, thyromegaly or abnormal cervical nodes.  Right carotid bruit.  Lungs:  Clear bilaterally to auscultation and percussion.  Mildly decreased breath sounds bilaterally.  Heart:  Non-displaced PMI, chest non-tender; slightly irregular rate and rhythm, S1, S2, 2/6 early SEM.  No gallop. Right carotid bruit. 1+ ankle edema, no varicosities.  Unable to palpate pedal pulses but feet  warm.   Abdomen:  Bowel sounds positive; abdomen soft and non-tender without masses, organomegaly, or hernias noted. No hepatosplenomegaly. Extremities:  No clubbing or cyanosis. Neurologic:  Alert and oriented x 3.  Assessment/Plan: 1. CAD: No ischemic symptoms.  She will continue ASA 81, statin.  Will continue Plavix long-term given CAD and PAD with no bleeding sequelae.  2. Ischemic cardiomyopathy: NYHA class II symptoms.  EF 45-50% on 3/15 echo. She is not volume overloaded on exam.  She has Lasix to use as needed but has not been using it.  Continue bisoprolol and hydralazine/Imdur  at current doses. She is not on ACEI or spironolactone due to CKD. 3. PAD: Stable calf claudication.  No rest pain or foot ulcerations.  Not good cilostazol candidate with history of CHF.  Will continue ASA, Plavix, statin.  She had peripheral angiography by Dr. Fletcher Anon, showing extensive disease.  She was not a very good candidate for endovascular intervention.  Therefore, it was decided to treat her medically unless she were to develop critical limb ischemia.   4. Carotid stenosis: Stable stenosis on 2/16 dopplers, repeat in 2/17.  5. Hyperlipidemia: Check lipids today, goal LDL < 70.   6. Aortic stenosis: Mild AS on last echo.  7. CKD: Check BMET today.    8. Smoking: I again encouraged her to quit smoking.  9. HTN: Add amlodipine 5 mg daily.   Loralie Champagne 07/01/2014

## 2014-07-05 DIAGNOSIS — N189 Chronic kidney disease, unspecified: Secondary | ICD-10-CM | POA: Diagnosis not present

## 2014-07-05 DIAGNOSIS — I129 Hypertensive chronic kidney disease with stage 1 through stage 4 chronic kidney disease, or unspecified chronic kidney disease: Secondary | ICD-10-CM | POA: Diagnosis not present

## 2014-07-05 DIAGNOSIS — E039 Hypothyroidism, unspecified: Secondary | ICD-10-CM | POA: Diagnosis not present

## 2014-07-05 DIAGNOSIS — M109 Gout, unspecified: Secondary | ICD-10-CM | POA: Diagnosis not present

## 2014-07-05 DIAGNOSIS — D631 Anemia in chronic kidney disease: Secondary | ICD-10-CM | POA: Diagnosis not present

## 2014-07-05 DIAGNOSIS — N184 Chronic kidney disease, stage 4 (severe): Secondary | ICD-10-CM | POA: Diagnosis not present

## 2014-07-05 DIAGNOSIS — N2581 Secondary hyperparathyroidism of renal origin: Secondary | ICD-10-CM | POA: Diagnosis not present

## 2014-07-18 DIAGNOSIS — E1129 Type 2 diabetes mellitus with other diabetic kidney complication: Secondary | ICD-10-CM | POA: Diagnosis not present

## 2014-07-18 DIAGNOSIS — F17200 Nicotine dependence, unspecified, uncomplicated: Secondary | ICD-10-CM | POA: Diagnosis not present

## 2014-07-18 DIAGNOSIS — I10 Essential (primary) hypertension: Secondary | ICD-10-CM | POA: Diagnosis not present

## 2014-07-18 DIAGNOSIS — Z6827 Body mass index (BMI) 27.0-27.9, adult: Secondary | ICD-10-CM | POA: Diagnosis not present

## 2014-07-18 DIAGNOSIS — N184 Chronic kidney disease, stage 4 (severe): Secondary | ICD-10-CM | POA: Diagnosis not present

## 2014-07-20 DIAGNOSIS — H2513 Age-related nuclear cataract, bilateral: Secondary | ICD-10-CM | POA: Diagnosis not present

## 2014-07-20 DIAGNOSIS — H4011X2 Primary open-angle glaucoma, moderate stage: Secondary | ICD-10-CM | POA: Diagnosis not present

## 2014-07-20 DIAGNOSIS — H11153 Pinguecula, bilateral: Secondary | ICD-10-CM | POA: Diagnosis not present

## 2014-07-20 DIAGNOSIS — H04123 Dry eye syndrome of bilateral lacrimal glands: Secondary | ICD-10-CM | POA: Diagnosis not present

## 2014-07-20 DIAGNOSIS — H18413 Arcus senilis, bilateral: Secondary | ICD-10-CM | POA: Diagnosis not present

## 2014-07-20 DIAGNOSIS — Z794 Long term (current) use of insulin: Secondary | ICD-10-CM | POA: Diagnosis not present

## 2014-07-20 DIAGNOSIS — E119 Type 2 diabetes mellitus without complications: Secondary | ICD-10-CM | POA: Diagnosis not present

## 2014-08-11 DIAGNOSIS — E1159 Type 2 diabetes mellitus with other circulatory complications: Secondary | ICD-10-CM | POA: Diagnosis not present

## 2014-08-11 DIAGNOSIS — F329 Major depressive disorder, single episode, unspecified: Secondary | ICD-10-CM | POA: Diagnosis not present

## 2014-08-11 DIAGNOSIS — I2510 Atherosclerotic heart disease of native coronary artery without angina pectoris: Secondary | ICD-10-CM | POA: Diagnosis not present

## 2014-08-11 DIAGNOSIS — Z6826 Body mass index (BMI) 26.0-26.9, adult: Secondary | ICD-10-CM | POA: Diagnosis not present

## 2014-08-11 DIAGNOSIS — M109 Gout, unspecified: Secondary | ICD-10-CM | POA: Diagnosis not present

## 2014-08-11 DIAGNOSIS — J439 Emphysema, unspecified: Secondary | ICD-10-CM | POA: Diagnosis not present

## 2014-08-11 DIAGNOSIS — E1151 Type 2 diabetes mellitus with diabetic peripheral angiopathy without gangrene: Secondary | ICD-10-CM | POA: Diagnosis not present

## 2014-08-11 DIAGNOSIS — N184 Chronic kidney disease, stage 4 (severe): Secondary | ICD-10-CM | POA: Diagnosis not present

## 2014-08-11 DIAGNOSIS — I739 Peripheral vascular disease, unspecified: Secondary | ICD-10-CM | POA: Diagnosis not present

## 2014-09-03 ENCOUNTER — Other Ambulatory Visit: Payer: Self-pay | Admitting: Cardiology

## 2014-09-08 DIAGNOSIS — C679 Malignant neoplasm of bladder, unspecified: Secondary | ICD-10-CM | POA: Diagnosis not present

## 2014-09-12 DIAGNOSIS — C679 Malignant neoplasm of bladder, unspecified: Secondary | ICD-10-CM | POA: Diagnosis not present

## 2014-09-15 DIAGNOSIS — Z23 Encounter for immunization: Secondary | ICD-10-CM | POA: Diagnosis not present

## 2014-09-19 ENCOUNTER — Other Ambulatory Visit: Payer: Self-pay | Admitting: Cardiology

## 2014-09-25 ENCOUNTER — Ambulatory Visit (INDEPENDENT_AMBULATORY_CARE_PROVIDER_SITE_OTHER): Payer: Medicare Other | Admitting: Ophthalmology

## 2014-10-05 DIAGNOSIS — I700 Atherosclerosis of aorta: Secondary | ICD-10-CM | POA: Diagnosis not present

## 2014-10-05 DIAGNOSIS — Z90710 Acquired absence of both cervix and uterus: Secondary | ICD-10-CM | POA: Diagnosis not present

## 2014-10-05 DIAGNOSIS — N2889 Other specified disorders of kidney and ureter: Secondary | ICD-10-CM | POA: Diagnosis not present

## 2014-10-05 DIAGNOSIS — C679 Malignant neoplasm of bladder, unspecified: Secondary | ICD-10-CM | POA: Diagnosis not present

## 2014-10-05 DIAGNOSIS — R591 Generalized enlarged lymph nodes: Secondary | ICD-10-CM | POA: Diagnosis not present

## 2014-10-17 DIAGNOSIS — I10 Essential (primary) hypertension: Secondary | ICD-10-CM | POA: Diagnosis not present

## 2014-10-17 DIAGNOSIS — E1129 Type 2 diabetes mellitus with other diabetic kidney complication: Secondary | ICD-10-CM | POA: Diagnosis not present

## 2014-10-17 DIAGNOSIS — N184 Chronic kidney disease, stage 4 (severe): Secondary | ICD-10-CM | POA: Diagnosis not present

## 2014-10-17 DIAGNOSIS — Z6826 Body mass index (BMI) 26.0-26.9, adult: Secondary | ICD-10-CM | POA: Diagnosis not present

## 2014-10-19 DIAGNOSIS — L602 Onychogryphosis: Secondary | ICD-10-CM | POA: Diagnosis not present

## 2014-10-19 DIAGNOSIS — E1151 Type 2 diabetes mellitus with diabetic peripheral angiopathy without gangrene: Secondary | ICD-10-CM | POA: Diagnosis not present

## 2014-10-19 DIAGNOSIS — L84 Corns and callosities: Secondary | ICD-10-CM | POA: Diagnosis not present

## 2014-10-20 DIAGNOSIS — Z8551 Personal history of malignant neoplasm of bladder: Secondary | ICD-10-CM | POA: Diagnosis not present

## 2014-10-25 ENCOUNTER — Other Ambulatory Visit: Payer: Self-pay | Admitting: Cardiology

## 2014-10-26 ENCOUNTER — Other Ambulatory Visit: Payer: Self-pay

## 2014-10-26 DIAGNOSIS — H04123 Dry eye syndrome of bilateral lacrimal glands: Secondary | ICD-10-CM | POA: Diagnosis not present

## 2014-10-26 DIAGNOSIS — H18413 Arcus senilis, bilateral: Secondary | ICD-10-CM | POA: Diagnosis not present

## 2014-10-26 DIAGNOSIS — H52223 Regular astigmatism, bilateral: Secondary | ICD-10-CM | POA: Diagnosis not present

## 2014-10-26 DIAGNOSIS — H11153 Pinguecula, bilateral: Secondary | ICD-10-CM | POA: Diagnosis not present

## 2014-10-26 DIAGNOSIS — Z9849 Cataract extraction status, unspecified eye: Secondary | ICD-10-CM | POA: Diagnosis not present

## 2014-10-26 DIAGNOSIS — H4011X2 Primary open-angle glaucoma, moderate stage: Secondary | ICD-10-CM | POA: Diagnosis not present

## 2014-10-26 DIAGNOSIS — H524 Presbyopia: Secondary | ICD-10-CM | POA: Diagnosis not present

## 2014-10-26 DIAGNOSIS — Z961 Presence of intraocular lens: Secondary | ICD-10-CM | POA: Diagnosis not present

## 2014-10-26 DIAGNOSIS — H5203 Hypermetropia, bilateral: Secondary | ICD-10-CM | POA: Diagnosis not present

## 2014-10-26 MED ORDER — NITROGLYCERIN 0.4 MG SL SUBL: 0.4000 mg | SUBLINGUAL_TABLET | SUBLINGUAL | Status: DC | PRN

## 2014-11-14 ENCOUNTER — Ambulatory Visit: Payer: Medicare Other | Admitting: Cardiovascular Disease

## 2014-12-01 ENCOUNTER — Other Ambulatory Visit: Payer: Self-pay | Admitting: Cardiology

## 2014-12-07 ENCOUNTER — Other Ambulatory Visit: Payer: Self-pay | Admitting: *Deleted

## 2014-12-07 DIAGNOSIS — I2510 Atherosclerotic heart disease of native coronary artery without angina pectoris: Secondary | ICD-10-CM

## 2014-12-07 MED ORDER — AMLODIPINE BESYLATE 5 MG PO TABS: 5.0000 mg | ORAL_TABLET | Freq: Every day | ORAL | Status: DC

## 2014-12-19 ENCOUNTER — Telehealth: Payer: Self-pay

## 2014-12-19 NOTE — Telephone Encounter (Signed)
Pt states Dr Aundra Dubin increased her amlodipine dose from 2.'5mg'$  to '5mg'$  daily in February 2016. Pt states she continued amlodipine 2.'5mg'$  daily until she picked up prescription 12/07/14  of amlodipine '5mg'$  daily. Pt states since she has been on higher dose of amlodipine 12/07/14 she has had bilateral leg cramps at night relieved with getting out of bed and stretching her legs. Pt denies any increase in leg cramps with activity or exercise. Pt states her current symptoms are not related to her PVD but increasing amlodipine to '5mg'$  daily. Pt states she has not taken amlodipine in 2 days and she does not have any more leg cramps.  Pt does not know her BP. Pt advised I will forward to Dr Aundra Dubin for review.

## 2014-12-19 NOTE — Telephone Encounter (Signed)
Would be very unusual to get leg cramps from increasing amlodipine from 2.5 to 5.  Would have her try again at 5 mg daily.  If it happens again, may be linked and would go back to 2.5 mg amlodipine.  If not, continue amlodipine 5 daily.

## 2014-12-19 NOTE — Telephone Encounter (Signed)
Pt advised, verbalized understanding, agreed with plan.  

## 2014-12-19 NOTE — Telephone Encounter (Signed)
Erica Jimenez needs a return phone call for problem she is having from her amlodipine that was increased recently. Pt stated she is now having cramping in her legs from the increased dose of amlodipine. Call back number same as listed in chart.

## 2014-12-20 DIAGNOSIS — E1151 Type 2 diabetes mellitus with diabetic peripheral angiopathy without gangrene: Secondary | ICD-10-CM | POA: Diagnosis not present

## 2014-12-20 DIAGNOSIS — L602 Onychogryphosis: Secondary | ICD-10-CM | POA: Diagnosis not present

## 2014-12-20 DIAGNOSIS — L84 Corns and callosities: Secondary | ICD-10-CM | POA: Diagnosis not present

## 2014-12-21 DIAGNOSIS — I739 Peripheral vascular disease, unspecified: Secondary | ICD-10-CM | POA: Diagnosis not present

## 2014-12-21 DIAGNOSIS — Z6825 Body mass index (BMI) 25.0-25.9, adult: Secondary | ICD-10-CM | POA: Diagnosis not present

## 2014-12-21 DIAGNOSIS — J439 Emphysema, unspecified: Secondary | ICD-10-CM | POA: Diagnosis not present

## 2014-12-21 DIAGNOSIS — E1151 Type 2 diabetes mellitus with diabetic peripheral angiopathy without gangrene: Secondary | ICD-10-CM | POA: Diagnosis not present

## 2014-12-21 DIAGNOSIS — F329 Major depressive disorder, single episode, unspecified: Secondary | ICD-10-CM | POA: Diagnosis not present

## 2014-12-28 ENCOUNTER — Other Ambulatory Visit: Payer: Self-pay | Admitting: Urology

## 2014-12-31 ENCOUNTER — Other Ambulatory Visit: Payer: Self-pay | Admitting: Cardiology

## 2015-01-02 ENCOUNTER — Encounter (HOSPITAL_BASED_OUTPATIENT_CLINIC_OR_DEPARTMENT_OTHER): Payer: Self-pay | Admitting: *Deleted

## 2015-01-02 NOTE — Progress Notes (Addendum)
Pt instructed npo pmn 9/5 x am meds w a sip of water. ,  No insulin. Pt to take 1/2 pm dose w hs snack.  To [facility] 9/6 @ 1030.  Needs istat 8 on arrival ekg in epic.

## 2015-01-03 ENCOUNTER — Telehealth: Payer: Self-pay | Admitting: Cardiology

## 2015-01-03 NOTE — Progress Notes (Signed)
Left message for Chastity , OR scheduler for Dr. Janice Norrie. Informing her of need for cardiac clearance from Dr. Aundra Dubin.

## 2015-01-03 NOTE — Progress Notes (Signed)
Consulted w Dr. Oletta Lamas regarding surgical clearance from medical Dr. And not cardiac clearance from Dr. Aundra Dubin. Will call [facility] re cardiac clearance.

## 2015-01-03 NOTE — Telephone Encounter (Signed)
New message      Request for surgical clearance:  What type of surgery is being performed? Bladder biopsy and ureteroscopy When is this surgery scheduled? 01-09-15 1. Are there any medications that need to be held prior to surgery and how long? Medical clearance from [place] only------plavix ok'd by PCP 2. Name of physician performing surgery? Dr Kellie Simmering  3. What is your office phone and fax number? Fax [phone]-----OK to answer on phone message--

## 2015-01-03 NOTE — Telephone Encounter (Signed)
OK if symptomatically stable, no new chest pain or dyspnea. Please call.

## 2015-01-04 DIAGNOSIS — Z1231 Encounter for screening mammogram for malignant neoplasm of breast: Secondary | ICD-10-CM | POA: Diagnosis not present

## 2015-01-04 NOTE — Telephone Encounter (Signed)
Pt states no new cardiac symptoms including chest pain or shortness of breath.

## 2015-01-04 NOTE — Telephone Encounter (Signed)
Left message on Erica Jimenez's voice mail [facility] for surgery, pt states she is symptomatically stable, will fax note to Dr Kellie Simmering.

## 2015-01-05 DIAGNOSIS — Z8551 Personal history of malignant neoplasm of bladder: Secondary | ICD-10-CM | POA: Diagnosis not present

## 2015-01-05 DIAGNOSIS — R828 Abnormal findings on cytological and histological examination of urine: Secondary | ICD-10-CM | POA: Diagnosis not present

## 2015-01-08 NOTE — H&P (Signed)
History of Present Illness     Erica Jimenez returns for follow-up. 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 She also had intravesical BCG. She has not had any recurrence. Cystoscopy a month ago was unremarkable. Urine cytology and FISH test were positive. She has a history of renal insufficiency. CT scan without contrast showed no renal mass. She voids well, denies hematuria. She needs cystoscopy, bilateral retrograde pyelograms, ureteroscopy for further evaluation. The procedure, risks, benefits were explained to the patient. The risks include but are not limited to hemorrhage, infection, ureteral injury, abdominal pain. She understands and wishes to proceed. She is scheduled today for the procedure.   Past Medical History Problems  1. History of Acute Myocardial Infarction 2. History of Arthritis 3. History of Gout 4. History of diabetes mellitus (Z86.39) 5. History of glaucoma (Z86.69) 6. History of hypercholesterolemia (Z86.39) 7. History of hypertension (Z86.79) 8. History of malignant neoplasm of bladder (Z85.51) 9. History of transient cerebral ischemia (Z86.73) 10. Personal history of bladder cancer (Z85.51) 11. Personal history of bladder cancer (Z85.51)  Surgical History Problems  1. History of Cath Stent Placement 2. History of Cystoscopy Bladder Tumor 3. History of Hysterectomy  Current Meds 1. Aspirin 81 MG TABS;  Therapy: (Recorded:06May2016) to Recorded 2. Azopt 1 % Ophthalmic Suspension;  Therapy: (Recorded:06May2016) to Recorded 3. Bisoprolol Fumarate 5 MG Oral Tablet;  Therapy: (Recorded:06May2016) to Recorded 4. Calcitriol 0.25 MCG Oral Capsule;  Therapy: (Recorded:06May2016) to Recorded 5. Fergon 240 (27 Fe) MG Oral Tablet;  Therapy: (Recorded:06May2016) to Recorded 6. FLUoxetine HCl - 10 MG Oral Capsule;  Therapy: (Recorded:06May2016) to Recorded 7. Folic Acid CAPS;  Therapy: (Recorded:28Apr2010) to Recorded 8. Furosemide TABS;  Therapy: (Recorded:28Apr2010) to Recorded 9. HydrALAZINE HCl TABS;  Therapy: (Recorded:28Apr2010) to Recorded 10. Isosorbide Dinitrate TABS;   Therapy: (Recorded:28Apr2010) to Recorded 11. Latanoprost 0.005 % Ophthalmic Solution;   Therapy: (Recorded:06May2016) to Recorded 12. Levothyroxine Sodium TABS;   Therapy: (Recorded:28Apr2010) to Recorded 13. Lipitor TABS;   Therapy: (Recorded:28Apr2010) to Recorded 14. Nitrostat 0.4 MG Sublingual Tablet Sublingual;   Therapy: (Recorded:06May2016) to Recorded 15. NovoLOG SOLN;   Therapy: (Recorded:28Apr2010) to Recorded 16. Plavix TABS;   Therapy: (Recorded:28Apr2010) to Recorded 17. Uloric 40 MG Oral Tablet;   Therapy: (Recorded:06May2016) to Recorded  Allergies Medication  1. Bactrim DS TABS  Family History Problems  1. Family history of Breast Cancer : Father 2. Family history of Diabetes Mellitus : Mother 3. Family history of Family Health Status Number Of Children : Maternal Aunt   1 daugh 4. Family history of Father Deceased At Age ____ : Maternal Aunt 5. Family history of Mother Deceased At Age ____ : Maternal Aunt   78 Breast ca 6. Family history of Tuberculosis  Social History Problems  1. Alcohol Use   2 per wk 2. Caffeine Use   2 per day 3. Marital History - Currently Married 4. Tobacco Use   1 pka day for 40 yrs  Review of Systems Genitourinary, constitutional, skin, eye, otolaryngeal, hematologic/lymphatic, cardiovascular, pulmonary, endocrine, musculoskeletal, gastrointestinal, neurological and psychiatric system(s) were reviewed and pertinent findings if present are noted and are otherwise negative.    Physical Exam Constitutional: Well nourished and well developed . No acute distress.  ENT:. The ears and nose are normal in appearance.  Neck: The appearance of the neck is normal and no neck mass is present.  Pulmonary: No respiratory distress and normal respiratory rhythm and effort.  Cardiovascular:  Heart rate and rhythm are normal .  No peripheral edema.  Abdomen: The abdomen is soft and nontender. No masses are palpated. No CVA tenderness. No hernias are palpable. No hepatosplenomegaly noted.  Lymphatics: The femoral and inguinal nodes are not enlarged or tender.  Skin: Normal skin turgor, no visible rash and no visible skin lesions.  Neuro/Psych:. Mood and affect are appropriate.    Results/Data Urine [Data Includes: Last 1 Day]   [id number]  COLOR YELLOW   APPEARANCE CLEAR   SPECIFIC GRAVITY 1.020   pH 5.0   GLUCOSE NEG mg/dL  BILIRUBIN NEG   KETONE NEG mg/dL  BLOOD NEG   PROTEIN NEG mg/dL  UROBILINOGEN 0.2 mg/dL  NITRITE NEG   LEUKOCYTE ESTERASE NEG    Assessment Assessed  1. History of malignant neoplasm of bladder (Z85.51) 2. Abnormal urine cytology (R82.8)  Plan Health Maintenance  1. UA With REFLEX; [Do Not Release]; Status:Resulted - Requires Verification;   Done:  [id number] 03:36PM PMH: History of malignant neoplasm of bladder  2. Follow-up Year x 1 Office  Follow-up with Dr Junious Silk  Status: Hold For - Exact Date,Date  of Service  Requested for: May 2017  Cystoscopy, bilateral retrograde pyelograms, ureteroscopy, washings for cytology, bladder biopsy.

## 2015-01-09 ENCOUNTER — Encounter (HOSPITAL_BASED_OUTPATIENT_CLINIC_OR_DEPARTMENT_OTHER): Payer: Self-pay

## 2015-01-09 ENCOUNTER — Ambulatory Visit (HOSPITAL_BASED_OUTPATIENT_CLINIC_OR_DEPARTMENT_OTHER): Payer: Medicare Other | Admitting: Anesthesiology

## 2015-01-09 ENCOUNTER — Encounter (HOSPITAL_BASED_OUTPATIENT_CLINIC_OR_DEPARTMENT_OTHER)
Admission: RE | Disposition: A | Discharge status: Home / Self Care | Payer: Self-pay | Source: Ambulatory Visit | Attending: Urology

## 2015-01-09 ENCOUNTER — Ambulatory Visit (HOSPITAL_BASED_OUTPATIENT_CLINIC_OR_DEPARTMENT_OTHER)
Admission: RE | Admit: 2015-01-09 | Discharge: 2015-01-09 | Disposition: A | Discharge status: Home / Self Care | Payer: Medicare Other | Source: Ambulatory Visit | Attending: Urology | Admitting: Urology

## 2015-01-09 DIAGNOSIS — Z8673 Personal history of transient ischemic attack (TIA), and cerebral infarction without residual deficits: Secondary | ICD-10-CM | POA: Insufficient documentation

## 2015-01-09 DIAGNOSIS — Z8551 Personal history of malignant neoplasm of bladder: Secondary | ICD-10-CM | POA: Diagnosis not present

## 2015-01-09 DIAGNOSIS — Z720 Tobacco use: Secondary | ICD-10-CM | POA: Diagnosis not present

## 2015-01-09 DIAGNOSIS — Z7902 Long term (current) use of antithrombotics/antiplatelets: Secondary | ICD-10-CM | POA: Insufficient documentation

## 2015-01-09 DIAGNOSIS — E039 Hypothyroidism, unspecified: Secondary | ICD-10-CM | POA: Insufficient documentation

## 2015-01-09 DIAGNOSIS — Z955 Presence of coronary angioplasty implant and graft: Secondary | ICD-10-CM | POA: Insufficient documentation

## 2015-01-09 DIAGNOSIS — E780 Pure hypercholesterolemia: Secondary | ICD-10-CM | POA: Diagnosis not present

## 2015-01-09 DIAGNOSIS — Z794 Long term (current) use of insulin: Secondary | ICD-10-CM | POA: Insufficient documentation

## 2015-01-09 DIAGNOSIS — Z79899 Other long term (current) drug therapy: Secondary | ICD-10-CM | POA: Diagnosis not present

## 2015-01-09 DIAGNOSIS — I252 Old myocardial infarction: Secondary | ICD-10-CM | POA: Insufficient documentation

## 2015-01-09 DIAGNOSIS — M109 Gout, unspecified: Secondary | ICD-10-CM | POA: Diagnosis not present

## 2015-01-09 DIAGNOSIS — E1151 Type 2 diabetes mellitus with diabetic peripheral angiopathy without gangrene: Secondary | ICD-10-CM | POA: Insufficient documentation

## 2015-01-09 DIAGNOSIS — M1990 Unspecified osteoarthritis, unspecified site: Secondary | ICD-10-CM | POA: Insufficient documentation

## 2015-01-09 DIAGNOSIS — R828 Abnormal findings on cytological and histological examination of urine: Secondary | ICD-10-CM | POA: Insufficient documentation

## 2015-01-09 DIAGNOSIS — I2510 Atherosclerotic heart disease of native coronary artery without angina pectoris: Secondary | ICD-10-CM | POA: Insufficient documentation

## 2015-01-09 DIAGNOSIS — I10 Essential (primary) hypertension: Secondary | ICD-10-CM | POA: Insufficient documentation

## 2015-01-09 DIAGNOSIS — Z7982 Long term (current) use of aspirin: Secondary | ICD-10-CM | POA: Insufficient documentation

## 2015-01-09 DIAGNOSIS — H409 Unspecified glaucoma: Secondary | ICD-10-CM | POA: Insufficient documentation

## 2015-01-09 DIAGNOSIS — J449 Chronic obstructive pulmonary disease, unspecified: Secondary | ICD-10-CM | POA: Diagnosis not present

## 2015-01-09 DIAGNOSIS — R8299 Other abnormal findings in urine: Secondary | ICD-10-CM | POA: Diagnosis not present

## 2015-01-09 DIAGNOSIS — Z87448 Personal history of other diseases of urinary system: Secondary | ICD-10-CM | POA: Diagnosis not present

## 2015-01-09 DIAGNOSIS — E119 Type 2 diabetes mellitus without complications: Secondary | ICD-10-CM | POA: Diagnosis not present

## 2015-01-09 HISTORY — DX: Unspecified osteoarthritis, unspecified site: M19.90

## 2015-01-09 HISTORY — DX: Nonscarring hair loss, unspecified: L65.9

## 2015-01-09 HISTORY — DX: Complete loss of teeth, unspecified cause, unspecified class: Z97.2

## 2015-01-09 HISTORY — DX: Anemia, unspecified: D64.9

## 2015-01-09 HISTORY — DX: Ischemic cardiomyopathy: I25.5

## 2015-01-09 HISTORY — DX: Complete loss of teeth, unspecified cause, unspecified class: K08.109

## 2015-01-09 HISTORY — DX: Arteriovenous fistula, acquired: I77.0

## 2015-01-09 HISTORY — DX: Unspecified hearing loss, unspecified ear: H91.90

## 2015-01-09 HISTORY — DX: Acute myocardial infarction, unspecified: I21.9

## 2015-01-09 HISTORY — DX: Anxiety disorder, unspecified: F41.9

## 2015-01-09 HISTORY — DX: Reserved for inherently not codable concepts without codable children: IMO0001

## 2015-01-09 HISTORY — DX: Presence of spectacles and contact lenses: Z97.3

## 2015-01-09 HISTORY — PX: CYSTOSCOPY/RETROGRADE/URETEROSCOPY: SHX5316

## 2015-01-09 LAB — POCT I-STAT, CHEM 8
BUN: 53 mg/dL — AB (ref 6–20)
CALCIUM ION: 1.14 mmol/L (ref 1.13–1.30)
Chloride: 116 mmol/L — ABNORMAL HIGH (ref 101–111)
Creatinine, Ser: 2.2 mg/dL — ABNORMAL HIGH (ref 0.44–1.00)
Glucose, Bld: 157 mg/dL — ABNORMAL HIGH (ref 65–99)
HEMATOCRIT: 41 % (ref 36.0–46.0)
HEMOGLOBIN: 13.9 g/dL (ref 12.0–15.0)
Potassium: 4 mmol/L (ref 3.5–5.1)
SODIUM: 135 mmol/L (ref 135–145)
TCO2: 22 mmol/L (ref 0–100)

## 2015-01-09 LAB — GLUCOSE, CAPILLARY: GLUCOSE-CAPILLARY: 152 mg/dL — AB (ref 65–99)

## 2015-01-09 SURGERY — CYSTOSCOPY/RETROGRADE/URETEROSCOPY
Anesthesia: General | Site: Bladder | Laterality: Bilateral

## 2015-01-09 MED ORDER — EPHEDRINE SULFATE 50 MG/ML IJ SOLN
INTRAMUSCULAR | Status: DC | PRN
  Administered 2015-01-09 (×2): 10 mg via INTRAVENOUS
  Administered 2015-01-09: 5 mg via INTRAVENOUS
  Administered 2015-01-09: 10 mg via INTRAVENOUS

## 2015-01-09 MED ORDER — FENTANYL CITRATE (PF) 100 MCG/2ML IJ SOLN
INTRAMUSCULAR | Status: DC | PRN
  Administered 2015-01-09: 50 ug via INTRAVENOUS

## 2015-01-09 MED ORDER — CEFAZOLIN SODIUM-DEXTROSE 2-3 GM-% IV SOLR
INTRAVENOUS | Status: AC
  Filled 2015-01-09: qty 50

## 2015-01-09 MED ORDER — LIDOCAINE HCL (CARDIAC) 20 MG/ML IV SOLN
INTRAVENOUS | Status: DC | PRN
  Administered 2015-01-09: 50 mg via INTRAVENOUS

## 2015-01-09 MED ORDER — OXYCODONE HCL 5 MG PO TABS
5.0000 mg | ORAL_TABLET | Freq: Once | ORAL | Status: DC | PRN
  Filled 2015-01-09: qty 1

## 2015-01-09 MED ORDER — FENTANYL CITRATE (PF) 100 MCG/2ML IJ SOLN
25.0000 ug | INTRAMUSCULAR | Status: DC | PRN
  Filled 2015-01-09: qty 1

## 2015-01-09 MED ORDER — FENTANYL CITRATE (PF) 100 MCG/2ML IJ SOLN
INTRAMUSCULAR | Status: AC
  Filled 2015-01-09: qty 4

## 2015-01-09 MED ORDER — PROPOFOL 10 MG/ML IV BOLUS
INTRAVENOUS | Status: DC | PRN
  Administered 2015-01-09: 60 mg via INTRAVENOUS
  Administered 2015-01-09: 100 mg via INTRAVENOUS

## 2015-01-09 MED ORDER — OXYCODONE HCL 5 MG/5ML PO SOLN
5.0000 mg | Freq: Once | ORAL | Status: DC | PRN
  Filled 2015-01-09: qty 5

## 2015-01-09 MED ORDER — SODIUM CHLORIDE 0.9 % IR SOLN
Status: DC | PRN
  Administered 2015-01-09: 3000 mL via INTRAVESICAL

## 2015-01-09 MED ORDER — CEFAZOLIN SODIUM 1-5 GM-% IV SOLN
1.0000 g | INTRAVENOUS | Status: DC
  Filled 2015-01-09: qty 50

## 2015-01-09 MED ORDER — IOHEXOL 350 MG/ML SOLN
INTRAVENOUS | Status: DC | PRN
  Administered 2015-01-09: 14 mL

## 2015-01-09 MED ORDER — SODIUM CHLORIDE 0.9 % IV SOLN
INTRAVENOUS | Status: DC
  Administered 2015-01-09: 12:00:00 via INTRAVENOUS
  Filled 2015-01-09: qty 1000

## 2015-01-09 MED ORDER — CEFAZOLIN SODIUM-DEXTROSE 2-3 GM-% IV SOLR
2.0000 g | INTRAVENOUS | Status: AC
Start: 2015-01-09 — End: 2015-01-09
  Administered 2015-01-09: 2 g via INTRAVENOUS
  Filled 2015-01-09: qty 50

## 2015-01-09 MED ORDER — ONDANSETRON HCL 4 MG/2ML IJ SOLN
4.0000 mg | Freq: Four times a day (QID) | INTRAMUSCULAR | Status: DC | PRN
  Filled 2015-01-09: qty 2

## 2015-01-09 SURGICAL SUPPLY — 22 items
BAG DRAIN URO-CYSTO SKYTR STRL (DRAIN) ×4 IMPLANT
BAG DRN UROCATH (DRAIN) ×2
CATH INTERMIT  6FR 70CM (CATHETERS) ×6 IMPLANT
CATH URET 5FR 28IN CONE TIP (BALLOONS) ×2
CATH URET 5FR 28IN OPEN ENDED (CATHETERS) IMPLANT
CATH URET 5FR 70CM CONE TIP (BALLOONS) ×1 IMPLANT
CLOTH BEACON ORANGE TIMEOUT ST (SAFETY) ×4 IMPLANT
CONT SPECI 4OZ STER CLIK (MISCELLANEOUS) ×6 IMPLANT
ELECT REM PT RETURN 9FT ADLT (ELECTROSURGICAL) ×4
ELECTRODE REM PT RTRN 9FT ADLT (ELECTROSURGICAL) ×2 IMPLANT
GLOVE BIO SURGEON STRL SZ 6.5 (GLOVE) ×4 IMPLANT
GLOVE BIO SURGEON STRL SZ7 (GLOVE) ×4 IMPLANT
GLOVE BIO SURGEONS STRL SZ 6.5 (GLOVE) ×2
GLOVE INDICATOR 6.5 STRL GRN (GLOVE) ×6 IMPLANT
GOWN STRL REUS W/ TWL LRG LVL3 (GOWN DISPOSABLE) ×4 IMPLANT
GOWN STRL REUS W/TWL LRG LVL3 (GOWN DISPOSABLE) ×12
GUIDEWIRE STR DUAL SENSOR (WIRE) ×6 IMPLANT
IV NS IRRIG 3000ML ARTHROMATIC (IV SOLUTION) ×8 IMPLANT
MANIFOLD NEPTUNE II (INSTRUMENTS) ×3 IMPLANT
PACK CYSTO (CUSTOM PROCEDURE TRAY) ×4 IMPLANT
SYR CONTROL 10ML LL (SYRINGE) ×6 IMPLANT
WATER STERILE IRR 3000ML UROMA (IV SOLUTION) ×1 IMPLANT

## 2015-01-09 NOTE — Discharge Instructions (Signed)
CYSTOSCOPY HOME CARE INSTRUCTIONS  Activity: Rest for the remainder of the day.  Do not drive or operate equipment today.  You may resume normal activities in one to two days as instructed by your physician.   Meals: Drink plenty of liquids and eat light foods such as gelatin or soup this evening.  You may return to a normal meal plan tomorrow.  Return to Work: You may return to work in one to two days or as instructed by your physician.  Special Instructions / Symptoms: Call your physician if any of these symptoms occur:   -persistent or heavy bleeding  -bleeding which continues after first few urination  -large blood clots that are difficult to pass  -urine stream diminishes or stops completely  -fever equal to or higher than 101 degrees Farenheit.  -cloudy urine with a strong, foul odor  -severe pain  Females should always wipe from front to back after elimination.  You may feel some burning pain when you urinate.  This should disappear with time.  Applying moist heat to the lower abdomen or a hot tub bath may help relieve the pain.   Follow-Up / Date of Return Visit to Your Physician: Call for an appointment to arrange follow-up.      Post Anesthesia Home Care Instructions  Activity: Get plenty of rest for the remainder of the day. A responsible adult should stay with you for 24 hours following the procedure.  For the next 24 hours, DO NOT: -Drive a car -Paediatric nurse -Drink alcoholic beverages -Take any medication unless instructed by your physician -Make any legal decisions or sign important papers.  Meals: Start with liquid foods such as gelatin or soup. Progress to regular foods as tolerated. Avoid greasy, spicy, heavy foods. If nausea and/or vomiting occur, drink only clear liquids until the nausea and/or vomiting subsides. Call your physician if vomiting continues.  Special Instructions/Symptoms: Your throat may feel dry or sore from the anesthesia or the  breathing tube placed in your throat during surgery. If this causes discomfort, gargle with warm salt water. The discomfort should disappear within 24 hours.  If you had a scopolamine patch placed behind your ear for the management of post- operative nausea and/or vomiting:  1. The medication in the patch is effective for 72 hours, after which it should be removed.  Wrap patch in a tissue and discard in the trash. Wash hands thoroughly with soap and water. 2. You may remove the patch earlier than 72 hours if you experience unpleasant side effects which may include dry mouth, dizziness or visual disturbances. 3. Avoid touching the patch. Wash your hands with soap and water after contact with the patch.

## 2015-01-09 NOTE — Transfer of Care (Signed)
Immediate Anesthesia Transfer of Care Note  Patient: Erica Jimenez  Procedure(s) Performed: Procedure(s): CYSTOSCOPY BILATERAL RETROGRADE PYELOGRAM, WASHINGS RENAL PELVIS & BLADDER  (Bilateral)  Patient Location: PACU  Anesthesia Type:General  Level of Consciousness: awake and oriented  Airway & Oxygen Therapy: Patient Spontanous Breathing and Patient connected to nasal cannula oxygen  Post-op Assessment: Report given to RN  Post vital signs: Reviewed and stable  Last Vitals:  Filed Vitals:   01/09/15 1038  BP: 123/42  Pulse: 44  Temp: 36.6 C  Resp: 24    Complications: No apparent anesthesia complications

## 2015-01-09 NOTE — Anesthesia Preprocedure Evaluation (Signed)
Anesthesia Evaluation  Patient identified by MRN, date of birth, ID band Patient awake    Reviewed: Allergy & Precautions, NPO status , Patient's Chart, lab work & pertinent test results  Airway Mallampati: II   Neck ROM: full    Dental   Pulmonary shortness of breath, COPDCurrent Smoker,  breath sounds clear to auscultation        Cardiovascular hypertension, + CAD, + Past MI and + Peripheral Vascular Disease + Valvular Problems/Murmurs AS Rhythm:regular Rate:Normal     Neuro/Psych Anxiety CVA    GI/Hepatic   Endo/Other  diabetes, Type 2Hypothyroidism   Renal/GU Renal InsufficiencyRenal disease     Musculoskeletal  (+) Arthritis -,   Abdominal   Peds  Hematology   Anesthesia Other Findings   Reproductive/Obstetrics                             Anesthesia Physical Anesthesia Plan  ASA: III  Anesthesia Plan: General   Post-op Pain Management:    Induction: Intravenous  Airway Management Planned: LMA  Additional Equipment:   Intra-op Plan:   Post-operative Plan:   Informed Consent: I have reviewed the patients History and Physical, chart, labs and discussed the procedure including the risks, benefits and alternatives for the proposed anesthesia with the patient or authorized representative who has indicated his/her understanding and acceptance.     Plan Discussed with: CRNA, Anesthesiologist and Surgeon  Anesthesia Plan Comments:         Anesthesia Quick Evaluation

## 2015-01-09 NOTE — Op Note (Signed)
Erica Jimenez is a 81 y.o.   01/09/2015  General  Preop diagnosis: History of bladder tumor. Positive urine cytology.  Postop diagnosis: Same  Procedure done: Cystoscopy, bladder washings for cytology. Bilateral retrograde pyelograms. Washings from each renal pelvis for cytology.  Surgeon: Charlene Brooke. [name]  Anesthesia: Gen.  Indication: Patient is an 81 years old female who had a TUR bladder tumor in January 2003 for high-grade invasive TCC of the bladder. She also had intravesical BCG. She has not had any recurrence. Cystoscopy in the office 2 months ago was unremarkable. Urine cytology and fish test were positive. She has a history of renal insufficiency. A CT scan without contrast showed no renal mass. She is scheduled today for cystoscopy bilateral retrograde pyelogram and washings for cytology.  Procedure: Patient was identified by her wrist band and proper timeout was taken.  Under general anesthesia she was prepped and draped and placed in the dorsolithotomy position. A panendoscope was inserted in the bladder. The bladder is moderately trabeculated. There is no erythema of the bladder mucosa. There is no stone or tumor in the bladder. The ureteral orifices are in normal position and shape.  The bladder was irrigated with normal saline and bladder washings were sent for cytology.  A sensor wire was passed and through a #6 [place] open-ended catheter. The sensor wire and the open-ended catheter were passed through the cystoscope and the right ureteral orifice. The open-ended catheter was passed over the sensor wire into the renal pelvis. The sensor wire was removed. The renal pelvis was irrigated with normal saline and washings were sent for cytology.  Right retrograde pyelogram:  Contrast was then injected through the open-ended catheter. The renal pelvis and calyces are normal. There is no evidence of filling defect in the renal pelvis nor in the calyces. The open-ended catheter was then  gradually pulled back in the ureter. Contrast was then injected through the open-ended catheter. The ureter is normal without any filling defect or hydroureter. The open-ended catheter was removed.  A sensor wire and open-ended catheter were then passed through the cystoscope and the left ureteral orifice. The sensor wire was advanced into the collecting system. A #6 [place] open-ended catheter was passed over the sensor wire into the renal pelvis. The sensor wire was removed. The renal pelvis was irrigated with normal saline and washings were sent for cytology.  Left retrograde pyelogram:  Contrast was then injected through the open-ended catheter. The calyces and the renal pelvis are normal. There is no hydronephrosis, no filling defect in the renal pelvis nor in the calyces. The open-ended catheter was then gradually pulled back in the ureter and contrast was injected through the open-ended catheter. The ureter is normal. There is no filling defect, no hydroureter. The open-ended catheter was then removed.  Since the retrograde pyelograms were normal I do not think she needs ureteroscopy.   The bladder was then emptied and the cystoscope removed.  She tolerated the procedure well and left the OR in satisfactory condition to postanesthesia care unit.  EBL: None

## 2015-01-09 NOTE — Anesthesia Procedure Notes (Signed)
Procedure Name: LMA Insertion Date/Time: 01/09/2015 12:11 PM Performed by: Wanita Chamberlain Pre-anesthesia Checklist: Patient identified, Timeout performed, Emergency Drugs available, Suction available and Patient being monitored Patient Re-evaluated:Patient Re-evaluated prior to inductionOxygen Delivery Method: Circle system utilized Preoxygenation: Pre-oxygenation with 100% oxygen Intubation Type: IV induction Ventilation: Mask ventilation without difficulty Laryngoscope Size: Mac and 3 Grade View: Grade I Tube type: Oral Tube size: 7.0 mm Number of attempts: 1 Placement Confirmation: ETT inserted through vocal cords under direct vision,  positive ETCO2 and breath sounds checked- equal and bilateral Secured at: 22 cm Tube secured with: Tape Dental Injury: Teeth and Oropharynx as per pre-operative assessment

## 2015-01-10 ENCOUNTER — Encounter (HOSPITAL_BASED_OUTPATIENT_CLINIC_OR_DEPARTMENT_OTHER): Payer: Self-pay | Admitting: Urology

## 2015-01-10 NOTE — Anesthesia Postprocedure Evaluation (Signed)
Anesthesia Post Note  Patient: Erica Jimenez  Procedure(s) Performed: Procedure(s) (LRB): CYSTOSCOPY BILATERAL RETROGRADE PYELOGRAM, WASHINGS RENAL PELVIS & BLADDER  (Bilateral)  Anesthesia type: General  Patient location: PACU  Post pain: Pain level controlled and Adequate analgesia  Post assessment: Post-op Vital signs reviewed, Patient's Cardiovascular Status Stable, Respiratory Function Stable, Patent Airway and Pain level controlled  Last Vitals:  Filed Vitals:   01/09/15 1415  BP: 115/51  Pulse: 53  Temp: 35.6 C  Resp: 14    Post vital signs: Reviewed and stable  Level of consciousness: awake, alert  and oriented  Complications: No apparent anesthesia complications

## 2015-01-11 ENCOUNTER — Telehealth: Payer: Self-pay

## 2015-01-11 NOTE — Telephone Encounter (Signed)
Patient called needing her Amlodipine sent to "[facility]" but it was sent to [facility] and I explained this to her and she stated that was fine and that she would go pick it up.

## 2015-01-16 DIAGNOSIS — C672 Malignant neoplasm of lateral wall of bladder: Secondary | ICD-10-CM | POA: Diagnosis not present

## 2015-01-19 ENCOUNTER — Other Ambulatory Visit: Payer: Self-pay | Admitting: Cardiology

## 2015-01-29 ENCOUNTER — Other Ambulatory Visit: Payer: Self-pay | Admitting: Cardiology

## 2015-02-11 ENCOUNTER — Other Ambulatory Visit: Payer: Self-pay | Admitting: Cardiology

## 2015-02-14 DIAGNOSIS — I10 Essential (primary) hypertension: Secondary | ICD-10-CM | POA: Diagnosis not present

## 2015-02-14 DIAGNOSIS — E1129 Type 2 diabetes mellitus with other diabetic kidney complication: Secondary | ICD-10-CM | POA: Diagnosis not present

## 2015-02-14 DIAGNOSIS — N184 Chronic kidney disease, stage 4 (severe): Secondary | ICD-10-CM | POA: Diagnosis not present

## 2015-02-20 ENCOUNTER — Other Ambulatory Visit: Payer: Self-pay | Admitting: Cardiology

## 2015-02-27 DIAGNOSIS — L84 Corns and callosities: Secondary | ICD-10-CM | POA: Diagnosis not present

## 2015-02-27 DIAGNOSIS — L602 Onychogryphosis: Secondary | ICD-10-CM | POA: Diagnosis not present

## 2015-02-27 DIAGNOSIS — E1151 Type 2 diabetes mellitus with diabetic peripheral angiopathy without gangrene: Secondary | ICD-10-CM | POA: Diagnosis not present

## 2015-03-05 DIAGNOSIS — Z794 Long term (current) use of insulin: Secondary | ICD-10-CM | POA: Diagnosis not present

## 2015-03-05 DIAGNOSIS — E785 Hyperlipidemia, unspecified: Secondary | ICD-10-CM | POA: Diagnosis not present

## 2015-03-05 DIAGNOSIS — I739 Peripheral vascular disease, unspecified: Secondary | ICD-10-CM | POA: Diagnosis not present

## 2015-03-05 DIAGNOSIS — D631 Anemia in chronic kidney disease: Secondary | ICD-10-CM | POA: Diagnosis not present

## 2015-03-05 DIAGNOSIS — M109 Gout, unspecified: Secondary | ICD-10-CM | POA: Diagnosis not present

## 2015-03-05 DIAGNOSIS — E039 Hypothyroidism, unspecified: Secondary | ICD-10-CM | POA: Diagnosis not present

## 2015-03-05 DIAGNOSIS — E119 Type 2 diabetes mellitus without complications: Secondary | ICD-10-CM | POA: Diagnosis not present

## 2015-03-05 DIAGNOSIS — Z8551 Personal history of malignant neoplasm of bladder: Secondary | ICD-10-CM | POA: Diagnosis not present

## 2015-03-05 DIAGNOSIS — N189 Chronic kidney disease, unspecified: Secondary | ICD-10-CM | POA: Diagnosis not present

## 2015-03-05 DIAGNOSIS — I129 Hypertensive chronic kidney disease with stage 1 through stage 4 chronic kidney disease, or unspecified chronic kidney disease: Secondary | ICD-10-CM | POA: Diagnosis not present

## 2015-03-05 DIAGNOSIS — N2581 Secondary hyperparathyroidism of renal origin: Secondary | ICD-10-CM | POA: Diagnosis not present

## 2015-03-05 DIAGNOSIS — Z720 Tobacco use: Secondary | ICD-10-CM | POA: Diagnosis not present

## 2015-03-05 DIAGNOSIS — N184 Chronic kidney disease, stage 4 (severe): Secondary | ICD-10-CM | POA: Diagnosis not present

## 2015-03-26 DIAGNOSIS — H35033 Hypertensive retinopathy, bilateral: Secondary | ICD-10-CM | POA: Diagnosis not present

## 2015-03-26 DIAGNOSIS — H401132 Primary open-angle glaucoma, bilateral, moderate stage: Secondary | ICD-10-CM | POA: Diagnosis not present

## 2015-03-26 DIAGNOSIS — I10 Essential (primary) hypertension: Secondary | ICD-10-CM | POA: Diagnosis not present

## 2015-03-26 DIAGNOSIS — E119 Type 2 diabetes mellitus without complications: Secondary | ICD-10-CM | POA: Diagnosis not present

## 2015-03-26 DIAGNOSIS — H04123 Dry eye syndrome of bilateral lacrimal glands: Secondary | ICD-10-CM | POA: Diagnosis not present

## 2015-05-01 DIAGNOSIS — I70293 Other atherosclerosis of native arteries of extremities, bilateral legs: Secondary | ICD-10-CM | POA: Diagnosis not present

## 2015-05-01 DIAGNOSIS — E1351 Other specified diabetes mellitus with diabetic peripheral angiopathy without gangrene: Secondary | ICD-10-CM | POA: Diagnosis not present

## 2015-05-01 DIAGNOSIS — L602 Onychogryphosis: Secondary | ICD-10-CM | POA: Diagnosis not present

## 2015-05-01 DIAGNOSIS — L84 Corns and callosities: Secondary | ICD-10-CM | POA: Diagnosis not present

## 2015-05-11 ENCOUNTER — Ambulatory Visit: Payer: Medicare Other | Admitting: Cardiology

## 2015-05-15 ENCOUNTER — Ambulatory Visit: Payer: Medicare Other | Admitting: Physician Assistant

## 2015-05-16 DIAGNOSIS — I10 Essential (primary) hypertension: Secondary | ICD-10-CM | POA: Diagnosis not present

## 2015-05-16 DIAGNOSIS — F17200 Nicotine dependence, unspecified, uncomplicated: Secondary | ICD-10-CM | POA: Diagnosis not present

## 2015-05-16 DIAGNOSIS — E1129 Type 2 diabetes mellitus with other diabetic kidney complication: Secondary | ICD-10-CM | POA: Diagnosis not present

## 2015-05-16 DIAGNOSIS — Z6825 Body mass index (BMI) 25.0-25.9, adult: Secondary | ICD-10-CM | POA: Diagnosis not present

## 2015-05-16 DIAGNOSIS — N184 Chronic kidney disease, stage 4 (severe): Secondary | ICD-10-CM | POA: Diagnosis not present

## 2015-05-20 ENCOUNTER — Other Ambulatory Visit: Payer: Self-pay | Admitting: Cardiology

## 2015-05-22 ENCOUNTER — Encounter: Payer: Self-pay | Admitting: Physician Assistant

## 2015-05-22 ENCOUNTER — Ambulatory Visit (INDEPENDENT_AMBULATORY_CARE_PROVIDER_SITE_OTHER): Payer: Medicare Other | Admitting: Physician Assistant

## 2015-05-22 VITALS — BP 104/54 | HR 54 | Ht 64.0 in | Wt 146.8 lb

## 2015-05-22 DIAGNOSIS — R0989 Other specified symptoms and signs involving the circulatory and respiratory systems: Secondary | ICD-10-CM

## 2015-05-22 DIAGNOSIS — E785 Hyperlipidemia, unspecified: Secondary | ICD-10-CM

## 2015-05-22 DIAGNOSIS — Z720 Tobacco use: Secondary | ICD-10-CM

## 2015-05-22 DIAGNOSIS — I739 Peripheral vascular disease, unspecified: Secondary | ICD-10-CM | POA: Diagnosis not present

## 2015-05-22 DIAGNOSIS — I2510 Atherosclerotic heart disease of native coronary artery without angina pectoris: Secondary | ICD-10-CM

## 2015-05-22 DIAGNOSIS — I6523 Occlusion and stenosis of bilateral carotid arteries: Secondary | ICD-10-CM

## 2015-05-22 DIAGNOSIS — F17200 Nicotine dependence, unspecified, uncomplicated: Secondary | ICD-10-CM

## 2015-05-22 DIAGNOSIS — I10 Essential (primary) hypertension: Secondary | ICD-10-CM

## 2015-05-22 NOTE — Assessment & Plan Note (Signed)
followed by primary care and has an appointment next month.

## 2015-05-22 NOTE — Assessment & Plan Note (Signed)
Patient has bilateral carotid bruits. Last Dopplers were in 06/2014. We'll repeat.

## 2015-05-22 NOTE — Patient Instructions (Signed)
Medication Instructions:  Your physician recommends that you continue on your current medications as directed. Please refer to the Current Medication list given to you today.   Labwork: None ordered  Testing/Procedures: Your physician has requested that you have a carotid duplex. This test is an ultrasound of the carotid arteries in your neck. It looks at blood flow through these arteries that supply the brain with blood. Allow one hour for this exam. There are no restrictions or special instructions.    Follow-Up: Your physician wants you to follow-up in: 6-12 [place]. Aundra Dubin.  You will receive a reminder letter in the mail two months in advance. If you don't receive a letter, please call our office to schedule the follow-up appointment.   Any Other Special Instructions Will Be Listed Below (If Applicable).     If you need a refill on your cardiac medications before your next appointment, please call your pharmacy.

## 2015-05-22 NOTE — Assessment & Plan Note (Signed)
Patient has cut back on her cigarette smoking but still is smoking a half pack per day. Discussed smoking cessation.

## 2015-05-22 NOTE — Progress Notes (Addendum)
Cardiology Office Note   Date:  05/22/2015   ID:  Erica Jimenez, DOB February 18, 1930, MRN [id number]  PCP:  Erica Jimenez  Cardiologist:  Dr. Aundra Dubin   Chief Complaint: Yearly follow-up    History of Present Illness: Erica Jimenez is a 82 y.o. female who presents for routine follow up.She has a history of PAD, CKD, CAD, mild ischemic  Last echo in 3/15 showed EF 45-50%.  In 4/15, she had peripheral arterial dopplers showing severe bilateral iliac artery stenosis. She saw Dr Erica Jimenez, who did peripheral angiography, this showed moderate to severe stenosis in the bilateral CIAs and EIAs. Given the extensive nature of disease and her CKD, it was decided to manage her medically as endovascular intervention would be very difficult.  Her husband died of lung CA in 29-Dec-2014. It took a toll on her and she's lost almost 20 lbs but is starting to eat better and not as depressed. She has heaving breathing and fast heart rate if she is rushing and doing a lot. It's relieved with NTG. It happenes about once every 2 weeks.Her legs hurt when she walks long distance like going to the grocery store and eases with rest. She continues to smoke 1/2 pack/day which is a decrease for her.Overall she feels like she is doing about the same as she was when last seen. No worsening of symptoms.  Past Medical History  Diagnosis Date  . DM (diabetes mellitus) ([place])   . Hyperlipidemia   . CAD (coronary artery disease)      Pt is s/p anterior MI in 1996 with stent placed in LAD.  Last myoview in our office was in 7/05 and  showed apical anterior, apical, and apical inferior infarct.  Minimal peri-infarct ischemia.  EF 49%.   . Hypothyroidism   . HTN (hypertension)   . CVA (cerebral vascular accident) ([place])   . Bladder cancer [facility])      s/p resection and BCG treatment.   Marland Kitchen PAD (peripheral artery disease) ([facility])      arterial doppler study 12/04 suggestive of > 50% bilateral SFA stenosis.  Pt has mild claudication.  No  invasive evaluation given CKD and desire to avoid contrast use.   . CKD (chronic kidney disease)      Pt is stage III-IV.  She had a fistula placed but is not on dialysis.  She follows with Dr. Lorrene Reid for  nephrology.   . Gout   . Aortic stenosis   . History of glaucoma   . COPD (chronic obstructive pulmonary disease) ([place])   . Myocardial infarction ([place])   . Shortness of breath dyspnea     on exertion  . Anxiety   . Arthritis     gout  . Anemia   . A-V fistula ([facility]rica Jimenez)     pt has 2  fistulas in rt arm  . Alopecia   . Full dentures   . Wears glasses   . HOH (hard of hearing)     slightly  . Cardiomyopathy, ischemic     Past Surgical History  Procedure Laterality Date  . Cystostomy w/ bladder biopsy    . Angioplasty    . Abdominal aortagram N/A 08/17/2013    Procedure: ABDOMINAL Maxcine Ham;  Surgeon: Erica Hampshire, MD;  Location[facility] CATH LAB;  Service: Cardiovascular;  Laterality: N/A;  . Eye surgery Bilateral     cataract w IOL  . Cystoscopy/retrograde/ureteroscopy Bilateral 01/09/2015    Procedure: CYSTOSCOPY BILATERAL RETROGRADE PYELOGRAM, WASHINGS RENAL  PELVIS & BLADDER ;  Surgeon: Erica Bandy, MD;  Location: [facility];  Service: Urology;  Laterality: Bilateral;     Current Outpatient Prescriptions  Medication Sig Dispense Refill  . ALPRAZolam (XANAX) 0.25 MG tablet Take 0.25 mg by mouth daily as needed for anxiety.    Marland Kitchen amLODipine (NORVASC) 5 MG tablet take 1 tablet by mouth once daily 30 tablet 3  . aspirin 81 MG tablet Take 81 mg by mouth daily.      Marland Kitchen atorvastatin (LIPITOR) 80 MG tablet Take 80 mg by mouth daily.      . bisoprolol (ZEBETA) 5 MG tablet take 1/2 tablet by mouth once daily 15 tablet 2  . brinzolamide (AZOPT) 1 % ophthalmic suspension Place 1 drop into both eyes daily.     . calcitRIOL (ROCALTROL) 0.25 MCG capsule Take 0.25 mcg by mouth 3 (three) times a week. Monday, Wednesday, Friday    . Carboxymethylcellulose Sodium (REFRESH TEARS OP)  Apply 1 drop to eye 2 (two) times daily. Both eyes.    Marland Kitchen clopidogrel (PLAVIX) 75 MG tablet Take 75 mg by mouth daily.      . febuxostat (ULORIC) 40 MG tablet Take 40 mg by mouth daily.    . Ferrous Gluconate (FERGON PO) Take 1 tablet by mouth daily.     Marland Kitchen FOLIC ACID PO Take 2.2 mg by mouth daily.     . furosemide (LASIX) 40 MG tablet Take 40 mg by mouth daily as needed.     . hydrALAZINE (APRESOLINE) 50 MG tablet take 1 tablet by mouth twice a day 60 tablet 5  . isosorbide mononitrate (IMDUR) 60 MG 24 hr tablet Take 60 mg by mouth daily.      Marland Kitchen latanoprost (XALATAN) 0.005 % ophthalmic solution Place 1 drop into both eyes at bedtime.     Marland Kitchen levothyroxine (SYNTHROID, LEVOTHROID) 50 MCG tablet Take 50 mcg by mouth daily.      . nitroGLYCERIN (NITROSTAT) 0.4 MG SL tablet Place 1 tablet (0.4 mg total) under the tongue every 5 (five) minutes as needed for chest pain. 25 tablet 3  . senna (SENOKOT) 8.6 MG tablet Take 1 tablet by mouth as needed for constipation.    Marland Kitchen HUMALOG KWIKPEN 100 UNIT/ML KiwkPen Inject 2-5 Units as directed 3 (three) times daily.  0  . LANTUS SOLOSTAR 100 UNIT/ML Solostar Pen Inject 10 Units as directed daily.  0   No current facility-administered medications for this visit.    Allergies:   Contrast media; Fluoxetine; and Sulfonamide derivatives    Social History:  The patient  reports that she has been smoking Cigarettes.  She has a 21 pack-year smoking history. She has never used smokeless tobacco. She reports that she drinks about 0.6 oz of alcohol per week. She reports that she does not use illicit drugs.   Family History:  The patient's    family history includes Breast cancer in her mother; Heart disease in her maternal aunt; Tuberculosis in her father.    ROS:  Please see the history of present illness.   Otherwise, review of systems are positive for anxiety depression, constipation, hearing loss.   All other systems are reviewed and negative.    PHYSICAL  EXAM: VS:  BP 104/54 mmHg  Pulse 54  Ht '5\' 4"'$  (1.626 m)  Wt 146 lb 12.8 oz (66.588 kg)  BMI 25.19 kg/m2  SpO2 98% , BMI Body mass index is 25.19 kg/(m^2). GEN: Well nourished, well developed, in no  acute distress Neck: Bilateral carotid bruits no JVD, HJR,  or masses Cardiac: RRR; 2/6 systolic murmur at the left sternal border, no gallop, rubs, thrill or heave,  Respiratory:  clear to auscultation bilaterally, normal work of breathing GI: soft, nontender, nondistended, + BS MS: no deformity or atrophy Extremities: Feet are warm but can palpate dorsalis pedis or posterior tibial pulses, without cyanosis, clubbing, edema.  Skin: warm and dry, no rash Neuro:  Strength and sensation are intact    EKG:  EKG is ordered today. Sinus rhythm with PVC right bundle branch block and inferior infarct age undetermined, no acute change from prior EKG   Recent Labs: 01/09/2015: BUN 53*; Creatinine, Ser 2.20*; Hemoglobin 13.9; Potassium 4.0; Sodium 135    Lipid Panel    Component Value Date/Time   CHOL 144 06/29/2014 1624   TRIG 133.0 06/29/2014 1624   HDL 53.70 06/29/2014 1624   CHOLHDL 3 06/29/2014 1624   VLDL 26.6 06/29/2014 1624   LDLCALC 64 06/29/2014 1624      Wt Readings from Last 3 Encounters:  05/22/15 146 lb 12.8 oz (66.588 kg)  01/09/15 143 lb (64.864 kg)  06/29/14 158 lb (71.668 kg)      Other studies Reviewed: Additional studies/ records that were reviewed today include and review of the records demonstrates:  Dopplers 06/2014 Stable 40-59% RICA, repeat 1 year  2-D echo 07/2013 Study Conclusions  - Left ventricle: There is distal septal akinesis The cavity   size was normal. There was mild focal basal hypertrophy of   the septum. Systolic function was mildly reduced. The   estimated ejection fraction was in the range of 45% to   50%. There is akinesis of the distalanteroseptal and   anterior myocardium. There is akinesis of the apical   myocardium. - Mitral valve:  Mild regurgitation    ASSESSMENT AND PLAN:  CAD, NATIVE VESSEL Patient has chronic exertional angina if she is rushing or overdoing it but it's relieved with sublingual nitroglycerin. This is stable and unchanged over the past year. Continue to follow.  Essential hypertension Pressure on the low side but stable  CARDIOMYOPATHY, ISCHEMIC No evidence of heart failure on exam.  Hyperlipidemia  followed by primary care and has an appointment next month.  CAROTID BRUIT Patient has bilateral carotid bruits. Last Dopplers were in 06/2014. We'll repeat.  Smoking Patient has cut back on her cigarette smoking but still is smoking a half pack per day. Discussed smoking cessation.    Sumner Boast, PA-C  05/22/2015 1:03 PM    [facility] [place] [place], [place], [place]  [address] Phone: [phone]; Fax: [phone]

## 2015-05-22 NOTE — Assessment & Plan Note (Signed)
No evidence of heart failure on exam 

## 2015-05-22 NOTE — Assessment & Plan Note (Signed)
Patient has chronic exertional angina if she is rushing or overdoing it but it's relieved with sublingual nitroglycerin. This is stable and unchanged over the past year. Continue to follow.

## 2015-05-22 NOTE — Assessment & Plan Note (Signed)
Pressure on the low side but stable

## 2015-05-29 ENCOUNTER — Ambulatory Visit (HOSPITAL_COMMUNITY)
Admission: RE | Admit: 2015-05-29 | Discharge: 2015-05-29 | Disposition: A | Discharge status: Home / Self Care | Payer: Medicare Other | Source: Ambulatory Visit | Attending: Cardiology | Admitting: Cardiology

## 2015-05-29 DIAGNOSIS — E1122 Type 2 diabetes mellitus with diabetic chronic kidney disease: Secondary | ICD-10-CM | POA: Insufficient documentation

## 2015-05-29 DIAGNOSIS — I129 Hypertensive chronic kidney disease with stage 1 through stage 4 chronic kidney disease, or unspecified chronic kidney disease: Secondary | ICD-10-CM | POA: Insufficient documentation

## 2015-05-29 DIAGNOSIS — E785 Hyperlipidemia, unspecified: Secondary | ICD-10-CM | POA: Insufficient documentation

## 2015-05-29 DIAGNOSIS — R0989 Other specified symptoms and signs involving the circulatory and respiratory systems: Secondary | ICD-10-CM | POA: Insufficient documentation

## 2015-05-29 DIAGNOSIS — I6523 Occlusion and stenosis of bilateral carotid arteries: Secondary | ICD-10-CM | POA: Diagnosis not present

## 2015-05-29 DIAGNOSIS — N184 Chronic kidney disease, stage 4 (severe): Secondary | ICD-10-CM | POA: Insufficient documentation

## 2015-06-10 ENCOUNTER — Other Ambulatory Visit: Payer: Self-pay | Admitting: Cardiology

## 2015-06-26 DIAGNOSIS — Z9849 Cataract extraction status, unspecified eye: Secondary | ICD-10-CM | POA: Diagnosis not present

## 2015-06-26 DIAGNOSIS — E109 Type 1 diabetes mellitus without complications: Secondary | ICD-10-CM | POA: Diagnosis not present

## 2015-06-26 DIAGNOSIS — Z961 Presence of intraocular lens: Secondary | ICD-10-CM | POA: Diagnosis not present

## 2015-06-26 DIAGNOSIS — H401132 Primary open-angle glaucoma, bilateral, moderate stage: Secondary | ICD-10-CM | POA: Diagnosis not present

## 2015-06-26 DIAGNOSIS — H11153 Pinguecula, bilateral: Secondary | ICD-10-CM | POA: Diagnosis not present

## 2015-06-26 DIAGNOSIS — Z794 Long term (current) use of insulin: Secondary | ICD-10-CM | POA: Diagnosis not present

## 2015-06-26 DIAGNOSIS — H18413 Arcus senilis, bilateral: Secondary | ICD-10-CM | POA: Diagnosis not present

## 2015-06-26 DIAGNOSIS — S0500XD Injury of conjunctiva and corneal abrasion without foreign body, unspecified eye, subsequent encounter: Secondary | ICD-10-CM | POA: Diagnosis not present

## 2015-06-26 DIAGNOSIS — H04123 Dry eye syndrome of bilateral lacrimal glands: Secondary | ICD-10-CM | POA: Diagnosis not present

## 2015-06-26 DIAGNOSIS — Z7984 Long term (current) use of oral hypoglycemic drugs: Secondary | ICD-10-CM | POA: Diagnosis not present

## 2015-07-03 DIAGNOSIS — E1351 Other specified diabetes mellitus with diabetic peripheral angiopathy without gangrene: Secondary | ICD-10-CM | POA: Diagnosis not present

## 2015-07-03 DIAGNOSIS — I70293 Other atherosclerosis of native arteries of extremities, bilateral legs: Secondary | ICD-10-CM | POA: Diagnosis not present

## 2015-07-03 DIAGNOSIS — L84 Corns and callosities: Secondary | ICD-10-CM | POA: Diagnosis not present

## 2015-07-03 DIAGNOSIS — L602 Onychogryphosis: Secondary | ICD-10-CM | POA: Diagnosis not present

## 2015-07-05 DIAGNOSIS — N184 Chronic kidney disease, stage 4 (severe): Secondary | ICD-10-CM | POA: Diagnosis not present

## 2015-07-05 DIAGNOSIS — E1151 Type 2 diabetes mellitus with diabetic peripheral angiopathy without gangrene: Secondary | ICD-10-CM | POA: Diagnosis not present

## 2015-07-05 DIAGNOSIS — M109 Gout, unspecified: Secondary | ICD-10-CM | POA: Diagnosis not present

## 2015-07-05 DIAGNOSIS — E038 Other specified hypothyroidism: Secondary | ICD-10-CM | POA: Diagnosis not present

## 2015-07-05 DIAGNOSIS — E784 Other hyperlipidemia: Secondary | ICD-10-CM | POA: Diagnosis not present

## 2015-07-12 DIAGNOSIS — I7389 Other specified peripheral vascular diseases: Secondary | ICD-10-CM | POA: Diagnosis not present

## 2015-07-12 DIAGNOSIS — E038 Other specified hypothyroidism: Secondary | ICD-10-CM | POA: Diagnosis not present

## 2015-07-12 DIAGNOSIS — I2510 Atherosclerotic heart disease of native coronary artery without angina pectoris: Secondary | ICD-10-CM | POA: Diagnosis not present

## 2015-07-12 DIAGNOSIS — N184 Chronic kidney disease, stage 4 (severe): Secondary | ICD-10-CM | POA: Diagnosis not present

## 2015-07-12 DIAGNOSIS — F329 Major depressive disorder, single episode, unspecified: Secondary | ICD-10-CM | POA: Diagnosis not present

## 2015-07-12 DIAGNOSIS — Z1389 Encounter for screening for other disorder: Secondary | ICD-10-CM | POA: Diagnosis not present

## 2015-07-12 DIAGNOSIS — E1151 Type 2 diabetes mellitus with diabetic peripheral angiopathy without gangrene: Secondary | ICD-10-CM | POA: Diagnosis not present

## 2015-07-12 DIAGNOSIS — Z6825 Body mass index (BMI) 25.0-25.9, adult: Secondary | ICD-10-CM | POA: Diagnosis not present

## 2015-07-12 DIAGNOSIS — Z0000 Encounter for general adult medical examination without abnormal findings: Secondary | ICD-10-CM | POA: Diagnosis not present

## 2015-07-12 DIAGNOSIS — M109 Gout, unspecified: Secondary | ICD-10-CM | POA: Diagnosis not present

## 2015-07-12 DIAGNOSIS — J438 Other emphysema: Secondary | ICD-10-CM | POA: Diagnosis not present

## 2015-07-12 DIAGNOSIS — E784 Other hyperlipidemia: Secondary | ICD-10-CM | POA: Diagnosis not present

## 2015-07-30 ENCOUNTER — Other Ambulatory Visit: Payer: Self-pay | Admitting: Cardiology

## 2015-08-01 DIAGNOSIS — Z8551 Personal history of malignant neoplasm of bladder: Secondary | ICD-10-CM | POA: Diagnosis not present

## 2015-08-01 DIAGNOSIS — R350 Frequency of micturition: Secondary | ICD-10-CM | POA: Diagnosis not present

## 2015-08-01 DIAGNOSIS — Z0000 Encounter for general adult medical examination without abnormal findings: Secondary | ICD-10-CM | POA: Diagnosis not present

## 2015-08-02 ENCOUNTER — Encounter (INDEPENDENT_AMBULATORY_CARE_PROVIDER_SITE_OTHER): Payer: Medicare Other | Admitting: Ophthalmology

## 2015-08-02 DIAGNOSIS — I10 Essential (primary) hypertension: Secondary | ICD-10-CM

## 2015-08-02 DIAGNOSIS — E113393 Type 2 diabetes mellitus with moderate nonproliferative diabetic retinopathy without macular edema, bilateral: Secondary | ICD-10-CM | POA: Diagnosis not present

## 2015-08-02 DIAGNOSIS — H43813 Vitreous degeneration, bilateral: Secondary | ICD-10-CM

## 2015-08-02 DIAGNOSIS — H35033 Hypertensive retinopathy, bilateral: Secondary | ICD-10-CM | POA: Diagnosis not present

## 2015-08-02 DIAGNOSIS — E11319 Type 2 diabetes mellitus with unspecified diabetic retinopathy without macular edema: Secondary | ICD-10-CM

## 2015-08-13 DIAGNOSIS — Z780 Asymptomatic menopausal state: Secondary | ICD-10-CM | POA: Diagnosis not present

## 2015-08-13 DIAGNOSIS — N184 Chronic kidney disease, stage 4 (severe): Secondary | ICD-10-CM | POA: Diagnosis not present

## 2015-08-15 DIAGNOSIS — E039 Hypothyroidism, unspecified: Secondary | ICD-10-CM | POA: Diagnosis not present

## 2015-08-15 DIAGNOSIS — E119 Type 2 diabetes mellitus without complications: Secondary | ICD-10-CM | POA: Diagnosis not present

## 2015-08-15 DIAGNOSIS — D631 Anemia in chronic kidney disease: Secondary | ICD-10-CM | POA: Diagnosis not present

## 2015-08-15 DIAGNOSIS — E785 Hyperlipidemia, unspecified: Secondary | ICD-10-CM | POA: Diagnosis not present

## 2015-08-15 DIAGNOSIS — Z720 Tobacco use: Secondary | ICD-10-CM | POA: Diagnosis not present

## 2015-08-15 DIAGNOSIS — I739 Peripheral vascular disease, unspecified: Secondary | ICD-10-CM | POA: Diagnosis not present

## 2015-08-15 DIAGNOSIS — Z8551 Personal history of malignant neoplasm of bladder: Secondary | ICD-10-CM | POA: Diagnosis not present

## 2015-08-15 DIAGNOSIS — Z794 Long term (current) use of insulin: Secondary | ICD-10-CM | POA: Diagnosis not present

## 2015-08-15 DIAGNOSIS — N2581 Secondary hyperparathyroidism of renal origin: Secondary | ICD-10-CM | POA: Diagnosis not present

## 2015-08-15 DIAGNOSIS — I129 Hypertensive chronic kidney disease with stage 1 through stage 4 chronic kidney disease, or unspecified chronic kidney disease: Secondary | ICD-10-CM | POA: Diagnosis not present

## 2015-08-15 DIAGNOSIS — N184 Chronic kidney disease, stage 4 (severe): Secondary | ICD-10-CM | POA: Diagnosis not present

## 2015-08-16 DIAGNOSIS — N184 Chronic kidney disease, stage 4 (severe): Secondary | ICD-10-CM | POA: Diagnosis not present

## 2015-08-21 DIAGNOSIS — N184 Chronic kidney disease, stage 4 (severe): Secondary | ICD-10-CM | POA: Diagnosis not present

## 2015-08-21 DIAGNOSIS — E1151 Type 2 diabetes mellitus with diabetic peripheral angiopathy without gangrene: Secondary | ICD-10-CM | POA: Diagnosis not present

## 2015-08-21 DIAGNOSIS — I10 Essential (primary) hypertension: Secondary | ICD-10-CM | POA: Diagnosis not present

## 2015-08-21 DIAGNOSIS — F329 Major depressive disorder, single episode, unspecified: Secondary | ICD-10-CM | POA: Diagnosis not present

## 2015-08-21 DIAGNOSIS — Z6825 Body mass index (BMI) 25.0-25.9, adult: Secondary | ICD-10-CM | POA: Diagnosis not present

## 2015-08-22 ENCOUNTER — Other Ambulatory Visit: Payer: Self-pay | Admitting: Cardiology

## 2015-08-22 ENCOUNTER — Encounter: Payer: Self-pay | Admitting: Physician Assistant

## 2015-08-28 DIAGNOSIS — F329 Major depressive disorder, single episode, unspecified: Secondary | ICD-10-CM | POA: Diagnosis not present

## 2015-09-04 DIAGNOSIS — L602 Onychogryphosis: Secondary | ICD-10-CM | POA: Diagnosis not present

## 2015-09-04 DIAGNOSIS — L84 Corns and callosities: Secondary | ICD-10-CM | POA: Diagnosis not present

## 2015-09-04 DIAGNOSIS — I70293 Other atherosclerosis of native arteries of extremities, bilateral legs: Secondary | ICD-10-CM | POA: Diagnosis not present

## 2015-09-04 DIAGNOSIS — E1351 Other specified diabetes mellitus with diabetic peripheral angiopathy without gangrene: Secondary | ICD-10-CM | POA: Diagnosis not present

## 2015-09-05 DIAGNOSIS — F329 Major depressive disorder, single episode, unspecified: Secondary | ICD-10-CM | POA: Diagnosis not present

## 2015-09-11 DIAGNOSIS — F329 Major depressive disorder, single episode, unspecified: Secondary | ICD-10-CM | POA: Diagnosis not present

## 2015-09-26 DIAGNOSIS — F329 Major depressive disorder, single episode, unspecified: Secondary | ICD-10-CM | POA: Diagnosis not present

## 2015-10-17 DIAGNOSIS — F329 Major depressive disorder, single episode, unspecified: Secondary | ICD-10-CM | POA: Diagnosis not present

## 2015-10-19 DIAGNOSIS — H18413 Arcus senilis, bilateral: Secondary | ICD-10-CM | POA: Diagnosis not present

## 2015-10-19 DIAGNOSIS — H401123 Primary open-angle glaucoma, left eye, severe stage: Secondary | ICD-10-CM | POA: Diagnosis not present

## 2015-10-19 DIAGNOSIS — H11153 Pinguecula, bilateral: Secondary | ICD-10-CM | POA: Diagnosis not present

## 2015-10-19 DIAGNOSIS — H401111 Primary open-angle glaucoma, right eye, mild stage: Secondary | ICD-10-CM | POA: Diagnosis not present

## 2015-10-19 DIAGNOSIS — E11319 Type 2 diabetes mellitus with unspecified diabetic retinopathy without macular edema: Secondary | ICD-10-CM | POA: Diagnosis not present

## 2015-10-19 DIAGNOSIS — H04123 Dry eye syndrome of bilateral lacrimal glands: Secondary | ICD-10-CM | POA: Diagnosis not present

## 2015-10-19 DIAGNOSIS — Z9849 Cataract extraction status, unspecified eye: Secondary | ICD-10-CM | POA: Diagnosis not present

## 2015-10-19 DIAGNOSIS — I10 Essential (primary) hypertension: Secondary | ICD-10-CM | POA: Diagnosis not present

## 2015-10-19 DIAGNOSIS — Z7984 Long term (current) use of oral hypoglycemic drugs: Secondary | ICD-10-CM | POA: Diagnosis not present

## 2015-10-19 DIAGNOSIS — H35033 Hypertensive retinopathy, bilateral: Secondary | ICD-10-CM | POA: Diagnosis not present

## 2015-10-19 DIAGNOSIS — Z961 Presence of intraocular lens: Secondary | ICD-10-CM | POA: Diagnosis not present

## 2015-10-19 DIAGNOSIS — H11423 Conjunctival edema, bilateral: Secondary | ICD-10-CM | POA: Diagnosis not present

## 2015-10-25 DIAGNOSIS — J438 Other emphysema: Secondary | ICD-10-CM | POA: Diagnosis not present

## 2015-10-25 DIAGNOSIS — I2510 Atherosclerotic heart disease of native coronary artery without angina pectoris: Secondary | ICD-10-CM | POA: Diagnosis not present

## 2015-10-25 DIAGNOSIS — I7389 Other specified peripheral vascular diseases: Secondary | ICD-10-CM | POA: Diagnosis not present

## 2015-10-25 DIAGNOSIS — N184 Chronic kidney disease, stage 4 (severe): Secondary | ICD-10-CM | POA: Diagnosis not present

## 2015-10-25 DIAGNOSIS — F329 Major depressive disorder, single episode, unspecified: Secondary | ICD-10-CM | POA: Diagnosis not present

## 2015-10-25 DIAGNOSIS — E784 Other hyperlipidemia: Secondary | ICD-10-CM | POA: Diagnosis not present

## 2015-10-25 DIAGNOSIS — E1151 Type 2 diabetes mellitus with diabetic peripheral angiopathy without gangrene: Secondary | ICD-10-CM | POA: Diagnosis not present

## 2015-10-25 DIAGNOSIS — Z6823 Body mass index (BMI) 23.0-23.9, adult: Secondary | ICD-10-CM | POA: Diagnosis not present

## 2015-11-07 DIAGNOSIS — E1351 Other specified diabetes mellitus with diabetic peripheral angiopathy without gangrene: Secondary | ICD-10-CM | POA: Diagnosis not present

## 2015-11-07 DIAGNOSIS — L602 Onychogryphosis: Secondary | ICD-10-CM | POA: Diagnosis not present

## 2015-11-07 DIAGNOSIS — L84 Corns and callosities: Secondary | ICD-10-CM | POA: Diagnosis not present

## 2015-11-07 DIAGNOSIS — I70293 Other atherosclerosis of native arteries of extremities, bilateral legs: Secondary | ICD-10-CM | POA: Diagnosis not present

## 2015-11-26 DIAGNOSIS — R351 Nocturia: Secondary | ICD-10-CM | POA: Diagnosis not present

## 2015-11-26 DIAGNOSIS — C672 Malignant neoplasm of lateral wall of bladder: Secondary | ICD-10-CM | POA: Diagnosis not present

## 2015-12-03 DIAGNOSIS — F329 Major depressive disorder, single episode, unspecified: Secondary | ICD-10-CM | POA: Diagnosis not present

## 2015-12-11 DIAGNOSIS — Z6823 Body mass index (BMI) 23.0-23.9, adult: Secondary | ICD-10-CM | POA: Diagnosis not present

## 2015-12-11 DIAGNOSIS — I10 Essential (primary) hypertension: Secondary | ICD-10-CM | POA: Diagnosis not present

## 2015-12-11 DIAGNOSIS — E1151 Type 2 diabetes mellitus with diabetic peripheral angiopathy without gangrene: Secondary | ICD-10-CM | POA: Diagnosis not present

## 2015-12-11 DIAGNOSIS — N184 Chronic kidney disease, stage 4 (severe): Secondary | ICD-10-CM | POA: Diagnosis not present

## 2015-12-11 DIAGNOSIS — F17200 Nicotine dependence, unspecified, uncomplicated: Secondary | ICD-10-CM | POA: Diagnosis not present

## 2015-12-20 DIAGNOSIS — F329 Major depressive disorder, single episode, unspecified: Secondary | ICD-10-CM | POA: Diagnosis not present

## 2016-01-09 DIAGNOSIS — Z1231 Encounter for screening mammogram for malignant neoplasm of breast: Secondary | ICD-10-CM | POA: Diagnosis not present

## 2016-01-24 ENCOUNTER — Other Ambulatory Visit: Payer: Self-pay | Admitting: Cardiology

## 2016-01-24 DIAGNOSIS — E1351 Other specified diabetes mellitus with diabetic peripheral angiopathy without gangrene: Secondary | ICD-10-CM | POA: Diagnosis not present

## 2016-01-24 DIAGNOSIS — I70293 Other atherosclerosis of native arteries of extremities, bilateral legs: Secondary | ICD-10-CM | POA: Diagnosis not present

## 2016-01-24 DIAGNOSIS — L84 Corns and callosities: Secondary | ICD-10-CM | POA: Diagnosis not present

## 2016-01-24 DIAGNOSIS — L602 Onychogryphosis: Secondary | ICD-10-CM | POA: Diagnosis not present

## 2016-02-11 ENCOUNTER — Ambulatory Visit (INDEPENDENT_AMBULATORY_CARE_PROVIDER_SITE_OTHER): Payer: Medicare Other | Admitting: Ophthalmology

## 2016-02-11 DIAGNOSIS — H43813 Vitreous degeneration, bilateral: Secondary | ICD-10-CM

## 2016-02-11 DIAGNOSIS — E113292 Type 2 diabetes mellitus with mild nonproliferative diabetic retinopathy without macular edema, left eye: Secondary | ICD-10-CM | POA: Diagnosis not present

## 2016-02-11 DIAGNOSIS — H35033 Hypertensive retinopathy, bilateral: Secondary | ICD-10-CM

## 2016-02-11 DIAGNOSIS — E113391 Type 2 diabetes mellitus with moderate nonproliferative diabetic retinopathy without macular edema, right eye: Secondary | ICD-10-CM | POA: Diagnosis not present

## 2016-02-11 DIAGNOSIS — E11319 Type 2 diabetes mellitus with unspecified diabetic retinopathy without macular edema: Secondary | ICD-10-CM

## 2016-02-11 DIAGNOSIS — I10 Essential (primary) hypertension: Secondary | ICD-10-CM

## 2016-02-19 DIAGNOSIS — I129 Hypertensive chronic kidney disease with stage 1 through stage 4 chronic kidney disease, or unspecified chronic kidney disease: Secondary | ICD-10-CM | POA: Diagnosis not present

## 2016-02-19 DIAGNOSIS — E119 Type 2 diabetes mellitus without complications: Secondary | ICD-10-CM | POA: Diagnosis not present

## 2016-02-19 DIAGNOSIS — Z720 Tobacco use: Secondary | ICD-10-CM | POA: Diagnosis not present

## 2016-02-19 DIAGNOSIS — I739 Peripheral vascular disease, unspecified: Secondary | ICD-10-CM | POA: Diagnosis not present

## 2016-02-19 DIAGNOSIS — N189 Chronic kidney disease, unspecified: Secondary | ICD-10-CM | POA: Diagnosis not present

## 2016-02-19 DIAGNOSIS — N2581 Secondary hyperparathyroidism of renal origin: Secondary | ICD-10-CM | POA: Diagnosis not present

## 2016-02-19 DIAGNOSIS — E039 Hypothyroidism, unspecified: Secondary | ICD-10-CM | POA: Diagnosis not present

## 2016-02-19 DIAGNOSIS — E785 Hyperlipidemia, unspecified: Secondary | ICD-10-CM | POA: Diagnosis not present

## 2016-02-19 DIAGNOSIS — Z8551 Personal history of malignant neoplasm of bladder: Secondary | ICD-10-CM | POA: Diagnosis not present

## 2016-02-19 DIAGNOSIS — Z794 Long term (current) use of insulin: Secondary | ICD-10-CM | POA: Diagnosis not present

## 2016-02-19 DIAGNOSIS — N184 Chronic kidney disease, stage 4 (severe): Secondary | ICD-10-CM | POA: Diagnosis not present

## 2016-02-19 DIAGNOSIS — F329 Major depressive disorder, single episode, unspecified: Secondary | ICD-10-CM | POA: Diagnosis not present

## 2016-02-19 DIAGNOSIS — D631 Anemia in chronic kidney disease: Secondary | ICD-10-CM | POA: Diagnosis not present

## 2016-02-27 DIAGNOSIS — F329 Major depressive disorder, single episode, unspecified: Secondary | ICD-10-CM | POA: Diagnosis not present

## 2016-03-11 DIAGNOSIS — Z6823 Body mass index (BMI) 23.0-23.9, adult: Secondary | ICD-10-CM | POA: Diagnosis not present

## 2016-03-11 DIAGNOSIS — F17200 Nicotine dependence, unspecified, uncomplicated: Secondary | ICD-10-CM | POA: Diagnosis not present

## 2016-03-11 DIAGNOSIS — I10 Essential (primary) hypertension: Secondary | ICD-10-CM | POA: Diagnosis not present

## 2016-03-11 DIAGNOSIS — N184 Chronic kidney disease, stage 4 (severe): Secondary | ICD-10-CM | POA: Diagnosis not present

## 2016-03-11 DIAGNOSIS — E1151 Type 2 diabetes mellitus with diabetic peripheral angiopathy without gangrene: Secondary | ICD-10-CM | POA: Diagnosis not present

## 2016-04-01 DIAGNOSIS — E1351 Other specified diabetes mellitus with diabetic peripheral angiopathy without gangrene: Secondary | ICD-10-CM | POA: Diagnosis not present

## 2016-04-01 DIAGNOSIS — I70293 Other atherosclerosis of native arteries of extremities, bilateral legs: Secondary | ICD-10-CM | POA: Diagnosis not present

## 2016-04-01 DIAGNOSIS — L602 Onychogryphosis: Secondary | ICD-10-CM | POA: Diagnosis not present

## 2016-04-01 DIAGNOSIS — L84 Corns and callosities: Secondary | ICD-10-CM | POA: Diagnosis not present

## 2016-04-01 DIAGNOSIS — F329 Major depressive disorder, single episode, unspecified: Secondary | ICD-10-CM | POA: Diagnosis not present

## 2016-04-09 DIAGNOSIS — H401123 Primary open-angle glaucoma, left eye, severe stage: Secondary | ICD-10-CM | POA: Diagnosis not present

## 2016-04-09 DIAGNOSIS — H11153 Pinguecula, bilateral: Secondary | ICD-10-CM | POA: Diagnosis not present

## 2016-04-09 DIAGNOSIS — I10 Essential (primary) hypertension: Secondary | ICD-10-CM | POA: Diagnosis not present

## 2016-04-09 DIAGNOSIS — H35033 Hypertensive retinopathy, bilateral: Secondary | ICD-10-CM | POA: Diagnosis not present

## 2016-04-09 DIAGNOSIS — H04123 Dry eye syndrome of bilateral lacrimal glands: Secondary | ICD-10-CM | POA: Diagnosis not present

## 2016-04-09 DIAGNOSIS — H11423 Conjunctival edema, bilateral: Secondary | ICD-10-CM | POA: Diagnosis not present

## 2016-04-09 DIAGNOSIS — H18413 Arcus senilis, bilateral: Secondary | ICD-10-CM | POA: Diagnosis not present

## 2016-04-09 DIAGNOSIS — E11319 Type 2 diabetes mellitus with unspecified diabetic retinopathy without macular edema: Secondary | ICD-10-CM | POA: Diagnosis not present

## 2016-04-09 DIAGNOSIS — H47233 Glaucomatous optic atrophy, bilateral: Secondary | ICD-10-CM | POA: Diagnosis not present

## 2016-04-09 DIAGNOSIS — H401112 Primary open-angle glaucoma, right eye, moderate stage: Secondary | ICD-10-CM | POA: Diagnosis not present

## 2016-04-09 DIAGNOSIS — H35043 Retinal micro-aneurysms, unspecified, bilateral: Secondary | ICD-10-CM | POA: Diagnosis not present

## 2016-04-09 DIAGNOSIS — Z7984 Long term (current) use of oral hypoglycemic drugs: Secondary | ICD-10-CM | POA: Diagnosis not present

## 2016-04-22 DIAGNOSIS — Z6822 Body mass index (BMI) 22.0-22.9, adult: Secondary | ICD-10-CM | POA: Diagnosis not present

## 2016-04-22 DIAGNOSIS — I10 Essential (primary) hypertension: Secondary | ICD-10-CM | POA: Diagnosis not present

## 2016-04-22 DIAGNOSIS — J439 Emphysema, unspecified: Secondary | ICD-10-CM | POA: Diagnosis not present

## 2016-04-22 DIAGNOSIS — F329 Major depressive disorder, single episode, unspecified: Secondary | ICD-10-CM | POA: Diagnosis not present

## 2016-04-22 DIAGNOSIS — E1151 Type 2 diabetes mellitus with diabetic peripheral angiopathy without gangrene: Secondary | ICD-10-CM | POA: Diagnosis not present

## 2016-04-22 DIAGNOSIS — I7389 Other specified peripheral vascular diseases: Secondary | ICD-10-CM | POA: Diagnosis not present

## 2016-04-22 DIAGNOSIS — N184 Chronic kidney disease, stage 4 (severe): Secondary | ICD-10-CM | POA: Diagnosis not present

## 2016-05-28 ENCOUNTER — Other Ambulatory Visit: Payer: Self-pay | Admitting: Cardiology

## 2016-05-30 ENCOUNTER — Telehealth: Payer: Self-pay | Admitting: Physician Assistant

## 2016-05-30 NOTE — Telephone Encounter (Signed)
Returned pts call.  She has noticed that her appetite has decreased in the last month or so.  She was asking if it could be Hydralazine.  I searched pts chart, and noticed that she has been on this med for over 2 years.  Pt just recently started  Celexa for depression and one of the common side effects are decreased appetite.  Pt was advised to contact her PCP re: Celexa.  She verbalized understanding and appreciation for the time.

## 2016-05-30 NOTE — Telephone Encounter (Signed)
Pt c/o medication issue:  1. Name of Medication: Hydralazine '50mg'$   2. How are you currently taking this medication (dosage and times per day)? 2xday  3. Are you having a reaction (difficulty breathing--STAT)? no  4. What is your medication issue? No appetite

## 2016-06-02 ENCOUNTER — Other Ambulatory Visit: Payer: Self-pay | Admitting: Physician Assistant

## 2016-06-02 DIAGNOSIS — I6523 Occlusion and stenosis of bilateral carotid arteries: Secondary | ICD-10-CM

## 2016-06-04 DIAGNOSIS — N184 Chronic kidney disease, stage 4 (severe): Secondary | ICD-10-CM | POA: Diagnosis not present

## 2016-06-06 ENCOUNTER — Ambulatory Visit (HOSPITAL_COMMUNITY)
Admission: RE | Admit: 2016-06-06 | Discharge: 2016-06-06 | Disposition: A | Discharge status: Home / Self Care | Payer: Medicare Other | Source: Ambulatory Visit | Attending: Cardiology | Admitting: Cardiology

## 2016-06-06 DIAGNOSIS — I10 Essential (primary) hypertension: Secondary | ICD-10-CM | POA: Diagnosis not present

## 2016-06-06 DIAGNOSIS — E785 Hyperlipidemia, unspecified: Secondary | ICD-10-CM | POA: Diagnosis not present

## 2016-06-06 DIAGNOSIS — J449 Chronic obstructive pulmonary disease, unspecified: Secondary | ICD-10-CM | POA: Diagnosis not present

## 2016-06-06 DIAGNOSIS — E1151 Type 2 diabetes mellitus with diabetic peripheral angiopathy without gangrene: Secondary | ICD-10-CM | POA: Diagnosis not present

## 2016-06-06 DIAGNOSIS — I6523 Occlusion and stenosis of bilateral carotid arteries: Secondary | ICD-10-CM | POA: Diagnosis not present

## 2016-06-06 DIAGNOSIS — Z8673 Personal history of transient ischemic attack (TIA), and cerebral infarction without residual deficits: Secondary | ICD-10-CM | POA: Diagnosis not present

## 2016-06-06 DIAGNOSIS — I2510 Atherosclerotic heart disease of native coronary artery without angina pectoris: Secondary | ICD-10-CM | POA: Insufficient documentation

## 2016-06-19 DIAGNOSIS — I10 Essential (primary) hypertension: Secondary | ICD-10-CM | POA: Diagnosis not present

## 2016-06-19 DIAGNOSIS — E1151 Type 2 diabetes mellitus with diabetic peripheral angiopathy without gangrene: Secondary | ICD-10-CM | POA: Diagnosis not present

## 2016-06-19 DIAGNOSIS — F17200 Nicotine dependence, unspecified, uncomplicated: Secondary | ICD-10-CM | POA: Diagnosis not present

## 2016-06-19 DIAGNOSIS — N184 Chronic kidney disease, stage 4 (severe): Secondary | ICD-10-CM | POA: Diagnosis not present

## 2016-06-19 DIAGNOSIS — Z6822 Body mass index (BMI) 22.0-22.9, adult: Secondary | ICD-10-CM | POA: Diagnosis not present

## 2016-06-24 DIAGNOSIS — L602 Onychogryphosis: Secondary | ICD-10-CM | POA: Diagnosis not present

## 2016-06-24 DIAGNOSIS — L84 Corns and callosities: Secondary | ICD-10-CM | POA: Diagnosis not present

## 2016-06-24 DIAGNOSIS — E1351 Other specified diabetes mellitus with diabetic peripheral angiopathy without gangrene: Secondary | ICD-10-CM | POA: Diagnosis not present

## 2016-06-26 ENCOUNTER — Other Ambulatory Visit: Payer: Self-pay | Admitting: Cardiology

## 2016-06-30 DIAGNOSIS — N184 Chronic kidney disease, stage 4 (severe): Secondary | ICD-10-CM | POA: Diagnosis not present

## 2016-06-30 DIAGNOSIS — R042 Hemoptysis: Secondary | ICD-10-CM | POA: Diagnosis not present

## 2016-06-30 DIAGNOSIS — R918 Other nonspecific abnormal finding of lung field: Secondary | ICD-10-CM | POA: Diagnosis not present

## 2016-06-30 DIAGNOSIS — F17200 Nicotine dependence, unspecified, uncomplicated: Secondary | ICD-10-CM | POA: Diagnosis not present

## 2016-06-30 DIAGNOSIS — R05 Cough: Secondary | ICD-10-CM | POA: Diagnosis not present

## 2016-07-02 ENCOUNTER — Other Ambulatory Visit: Payer: Self-pay | Admitting: Internal Medicine

## 2016-07-02 DIAGNOSIS — R918 Other nonspecific abnormal finding of lung field: Secondary | ICD-10-CM

## 2016-07-08 ENCOUNTER — Ambulatory Visit
Admission: RE | Admit: 2016-07-08 | Discharge: 2016-07-08 | Disposition: A | Discharge status: Home / Self Care | Payer: Medicare Other | Source: Ambulatory Visit | Attending: Internal Medicine | Admitting: Internal Medicine

## 2016-07-08 DIAGNOSIS — R918 Other nonspecific abnormal finding of lung field: Secondary | ICD-10-CM | POA: Diagnosis not present

## 2016-07-11 ENCOUNTER — Other Ambulatory Visit: Payer: Self-pay | Admitting: Cardiology

## 2016-07-16 ENCOUNTER — Other Ambulatory Visit: Payer: Self-pay | Admitting: Cardiology

## 2016-07-16 ENCOUNTER — Encounter: Payer: Self-pay | Admitting: Cardiovascular Disease

## 2016-07-21 ENCOUNTER — Encounter: Payer: Self-pay | Admitting: Pulmonary Disease

## 2016-07-21 ENCOUNTER — Ambulatory Visit (INDEPENDENT_AMBULATORY_CARE_PROVIDER_SITE_OTHER): Payer: Medicare Other | Admitting: Pulmonary Disease

## 2016-07-21 ENCOUNTER — Telehealth: Payer: Self-pay | Admitting: Pulmonary Disease

## 2016-07-21 VITALS — BP 120/60 | HR 52 | Ht 64.0 in | Wt 135.6 lb

## 2016-07-21 DIAGNOSIS — R918 Other nonspecific abnormal finding of lung field: Secondary | ICD-10-CM | POA: Diagnosis not present

## 2016-07-21 DIAGNOSIS — I6523 Occlusion and stenosis of bilateral carotid arteries: Secondary | ICD-10-CM | POA: Diagnosis not present

## 2016-07-21 NOTE — Patient Instructions (Signed)
Schedule PET scan Schedule CT-guided needle biopsy of lung mass  Stop taking Plavix

## 2016-07-21 NOTE — Addendum Note (Signed)
Addended by: Valerie Salts on: 07/21/2016 05:18 PM   Modules accepted: Orders

## 2016-07-21 NOTE — Assessment & Plan Note (Signed)
The various options of biopsy including bronchoscopy, CT guided needle aspiration and surgical biopsy were discussed.The risks of each procedure including coughing, bleeding and the  chances of lung puncture requiring chest tube were discussed in great detail. The benefits & alternatives including serial follow up were also discussed.   Unfortunately this appears to be metastatic lung cancer. PET scan will be scheduled. She elected to go with the needle biopsy since she would like a definitive procedure to get the biopsy. We think this would be reasonable and would be a low risk since the right lower lobe lung masses up against the pleura. Have asked her to stop taking the Plavix today

## 2016-07-21 NOTE — Telephone Encounter (Signed)
Please change to CT-guided needle biopsy of lung mass No labs needed

## 2016-07-21 NOTE — Telephone Encounter (Signed)
Radiology called and stated that the US biopsy needed to be changed to the CT biopsy.  RA please advise if you would like any labs ordered for this biopsy.  thanks

## 2016-07-21 NOTE — Progress Notes (Signed)
Subjective:    Patient ID: Erica Jimenez, female    DOB: [birth year], 83 y.o.   MRN: [id number]  HPI   Chief Complaint  Patient presents with  . Pulm Consult    For possible lung mass. Denies any breathing issues or chest pains.     83 year old smoker presents for evaluation of abnormal imaging studies. She is accompanied by her daughter [name] today   She reports a dry cough and 2 episodes of blood stained sputum about a month ago. She reports a 27 pound weight loss over the last 6 months. She denies dyspnea and hemoptysis has not recurred since then. She underwent chest x-ray and then CT scan by her PCP and was referred to [place]. Incidentally CT chest 05/2012 was obtained for non-resolving pneumonia and this showed multifocal nodular and masslike areas of airspace consolidation especially in the right upper lobe and nodular lesions in the right lower lobe. She was then evaluated in pulmonary office in follow-up chest x-ray showed resolution of right upper lobe airspace disease  CT chest 07/08/16 showed a large central right upper lobe lung mass surrounding and partially obstructing the right upper lobe bronchus measuring 3.63.4 cm. There was also to right lower lobe masses 100 in the right lower lobe bronchus and another against the pleura. There were satellite nodules located around this masses. No liver or bone involvement was noted  She also has a past medical history of ischemic cardiomyopathy with CAD, she takes Plavix for TIA in the past, she has CK D and a fistula was placed with anticipation of dialysis but apparently her renal function has not improved    Past Medical History:  Diagnosis Date  . A-V fistula ([place])    pt has 2  fistulas in rt arm  . Alopecia   . Anemia   . Anxiety   . Aortic stenosis   . Arthritis    gout  . Bladder cancer [facility])     s/p resection and BCG treatment.   Marland Kitchen CAD (coronary artery disease)     Pt is s/p anterior MI in 1996 with stent placed in  LAD.  Last myoview in our office was in 7/05 and  showed apical anterior, apical, and apical inferior infarct.  Minimal peri-infarct ischemia.  EF 49%.   . Cardiomyopathy, ischemic   . CKD (chronic kidney disease)     Pt is stage III-IV.  She had a fistula placed but is not on dialysis.  She follows with Dr. Lorrene Reid for  nephrology.   Marland Kitchen COPD (chronic obstructive pulmonary disease) ([place])   . CVA (cerebral vascular accident) ([place])   . DM (diabetes mellitus) ([place])   . Full dentures   . Gout   . History of glaucoma   . HOH (hard of hearing)    slightly  . HTN (hypertension)   . Hyperlipidemia   . Hypothyroidism   . Myocardial infarction   . PAD (peripheral artery disease) ([facility])     arterial doppler study 12/04 suggestive of > 50% bilateral SFA stenosis.  Pt has mild claudication.  No invasive evaluation given CKD and desire to avoid contrast use.   Marland Kitchen Shortness of breath dyspnea    on exertion  . Wears glasses     Past Surgical History:  Procedure Laterality Date  . ABDOMINAL AORTAGRAM N/A 08/17/2013   Procedure: ABDOMINAL Maxcine Ham;  Surgeon: Wellington Hampshire, MD;  Location: [facility] [facility];  Service: Cardiovascular;  Laterality: N/A;  . ANGIOPLASTY    .  CYSTOSCOPY/RETROGRADE/URETEROSCOPY Bilateral 01/09/2015   Procedure: CYSTOSCOPY BILATERAL RETROGRADE PYELOGRAM, WASHINGS RENAL PELVIS & BLADDER ;  Surgeon: Lowella Bandy, MD;  Location[facility];  Service: Urology;  Laterality: Bilateral;  . CYSTOSTOMY W/ BLADDER BIOPSY    . EYE SURGERY Bilateral    cataract w IOL    Allergies  Allergen Reactions  . Contrast Media [Iodinated Diagnostic Agents] Other (See Comments)    Renal insufficiency  . Fluoxetine Rash  . Sulfonamide Derivatives Nausea Only     Social History   Social History  . Marital status: Widowed    Spouse name: N/A  . Number of children: N/A  . Years of education: N/A   Occupational History  . Not on file.   Social History Main Topics  . Smoking  status: Current Every Day Smoker    Packs/day: 0.35    Years: 60.00    Types: Cigarettes  . Smokeless tobacco: Never Used  . Alcohol use 0.6 oz/week    1 Shots of liquor per week  . Drug use: No  . Sexual activity: Not on file   Other Topics Concern  . Not on file   Social History Narrative  . No narrative on file     Family History  Problem Relation Age of Onset  . Breast cancer Mother   . Tuberculosis Father   . Heart disease Maternal Aunt        Review of Systems Constitutional: negative for anorexia, fevers and sweats  Eyes: negative for irritation, redness and visual disturbance  Ears, nose, mouth, throat, and face: negative for earaches, epistaxis, nasal congestion and sore throat  Respiratory: negative for  dyspnea on exertion, sputum and wheezing  Cardiovascular: negative for chest pain, dyspnea, lower extremity edema, orthopnea, palpitations and syncope  Gastrointestinal: negative for abdominal pain, constipation, diarrhea, melena, nausea and vomiting  Genitourinary:negative for dysuria, frequency and hematuria  Hematologic/lymphatic: negative for bleeding, easy bruising and lymphadenopathy  Musculoskeletal:negative for arthralgias, muscle weakness and stiff joints  Neurological: negative for coordination problems, gait problems, headaches and weakness  Endocrine: negative for diabetic symptoms including polydipsia, polyuria and weight loss     Objective:   Physical Exam   Gen. Pleasant, well-nourished, in no distress, normal affect ENT - no lesions, no post nasal drip Neck: No JVD, no thyromegaly, no carotid bruits Lungs: no use of accessory muscles, no dullness to percussion,decreased  without rales or rhonchi  Cardiovascular: Rhythm regular, heart sounds  normal, no murmurs or gallops, no peripheral edema Abdomen: soft and non-tender, no hepatosplenomegaly, BS normal. Musculoskeletal: No deformities, no cyanosis or clubbing, no bone tenderness Neuro:   alert, non focal        Assessment & Plan:

## 2016-07-21 NOTE — Telephone Encounter (Signed)
Changed order per Dr. Elsworth Soho.

## 2016-07-22 ENCOUNTER — Other Ambulatory Visit: Payer: Self-pay

## 2016-07-22 DIAGNOSIS — R918 Other nonspecific abnormal finding of lung field: Secondary | ICD-10-CM

## 2016-07-22 NOTE — Addendum Note (Signed)
Addended by: Valerie Salts on: 07/22/2016 03:14 PM   Modules accepted: Orders

## 2016-07-23 ENCOUNTER — Encounter: Payer: Medicare Other | Admitting: Cardiovascular Disease

## 2016-07-25 ENCOUNTER — Other Ambulatory Visit: Payer: Self-pay | Admitting: Physician Assistant

## 2016-07-25 ENCOUNTER — Other Ambulatory Visit: Payer: Self-pay | Admitting: General Surgery

## 2016-07-25 DIAGNOSIS — Z7984 Long term (current) use of oral hypoglycemic drugs: Secondary | ICD-10-CM | POA: Diagnosis not present

## 2016-07-25 DIAGNOSIS — H401123 Primary open-angle glaucoma, left eye, severe stage: Secondary | ICD-10-CM | POA: Diagnosis not present

## 2016-07-25 DIAGNOSIS — H18413 Arcus senilis, bilateral: Secondary | ICD-10-CM | POA: Diagnosis not present

## 2016-07-25 DIAGNOSIS — H04123 Dry eye syndrome of bilateral lacrimal glands: Secondary | ICD-10-CM | POA: Diagnosis not present

## 2016-07-25 DIAGNOSIS — H401112 Primary open-angle glaucoma, right eye, moderate stage: Secondary | ICD-10-CM | POA: Diagnosis not present

## 2016-07-25 DIAGNOSIS — I10 Essential (primary) hypertension: Secondary | ICD-10-CM | POA: Diagnosis not present

## 2016-07-25 DIAGNOSIS — Z794 Long term (current) use of insulin: Secondary | ICD-10-CM | POA: Diagnosis not present

## 2016-07-25 DIAGNOSIS — H35033 Hypertensive retinopathy, bilateral: Secondary | ICD-10-CM | POA: Diagnosis not present

## 2016-07-25 DIAGNOSIS — H47233 Glaucomatous optic atrophy, bilateral: Secondary | ICD-10-CM | POA: Diagnosis not present

## 2016-07-25 DIAGNOSIS — H11153 Pinguecula, bilateral: Secondary | ICD-10-CM | POA: Diagnosis not present

## 2016-07-25 DIAGNOSIS — E11319 Type 2 diabetes mellitus with unspecified diabetic retinopathy without macular edema: Secondary | ICD-10-CM | POA: Diagnosis not present

## 2016-07-25 DIAGNOSIS — H11423 Conjunctival edema, bilateral: Secondary | ICD-10-CM | POA: Diagnosis not present

## 2016-07-28 ENCOUNTER — Encounter (HOSPITAL_COMMUNITY): Payer: Self-pay

## 2016-07-28 ENCOUNTER — Ambulatory Visit (HOSPITAL_COMMUNITY)
Admission: RE | Admit: 2016-07-28 | Discharge: 2016-07-28 | Disposition: A | Discharge status: Home / Self Care | Payer: Medicare Other | Source: Ambulatory Visit | Attending: Pulmonary Disease | Admitting: Pulmonary Disease

## 2016-07-28 ENCOUNTER — Ambulatory Visit (HOSPITAL_COMMUNITY)
Admission: RE | Admit: 2016-07-28 | Discharge: 2016-07-28 | Disposition: A | Discharge status: Home / Self Care | Payer: Medicare Other | Source: Ambulatory Visit | Attending: Interventional Radiology | Admitting: Interventional Radiology

## 2016-07-28 DIAGNOSIS — Z87891 Personal history of nicotine dependence: Secondary | ICD-10-CM | POA: Insufficient documentation

## 2016-07-28 DIAGNOSIS — C3431 Malignant neoplasm of lower lobe, right bronchus or lung: Secondary | ICD-10-CM | POA: Insufficient documentation

## 2016-07-28 DIAGNOSIS — R634 Abnormal weight loss: Secondary | ICD-10-CM | POA: Diagnosis not present

## 2016-07-28 DIAGNOSIS — N186 End stage renal disease: Secondary | ICD-10-CM | POA: Diagnosis not present

## 2016-07-28 DIAGNOSIS — I252 Old myocardial infarction: Secondary | ICD-10-CM | POA: Insufficient documentation

## 2016-07-28 DIAGNOSIS — Z8673 Personal history of transient ischemic attack (TIA), and cerebral infarction without residual deficits: Secondary | ICD-10-CM | POA: Diagnosis not present

## 2016-07-28 DIAGNOSIS — H409 Unspecified glaucoma: Secondary | ICD-10-CM | POA: Diagnosis not present

## 2016-07-28 DIAGNOSIS — Z79899 Other long term (current) drug therapy: Secondary | ICD-10-CM | POA: Insufficient documentation

## 2016-07-28 DIAGNOSIS — Z794 Long term (current) use of insulin: Secondary | ICD-10-CM | POA: Insufficient documentation

## 2016-07-28 DIAGNOSIS — I120 Hypertensive chronic kidney disease with stage 5 chronic kidney disease or end stage renal disease: Secondary | ICD-10-CM | POA: Insufficient documentation

## 2016-07-28 DIAGNOSIS — Z803 Family history of malignant neoplasm of breast: Secondary | ICD-10-CM | POA: Insufficient documentation

## 2016-07-28 DIAGNOSIS — Z888 Allergy status to other drugs, medicaments and biological substances status: Secondary | ICD-10-CM | POA: Diagnosis not present

## 2016-07-28 DIAGNOSIS — Z7982 Long term (current) use of aspirin: Secondary | ICD-10-CM | POA: Insufficient documentation

## 2016-07-28 DIAGNOSIS — R918 Other nonspecific abnormal finding of lung field: Secondary | ICD-10-CM | POA: Diagnosis present

## 2016-07-28 DIAGNOSIS — Z91041 Radiographic dye allergy status: Secondary | ICD-10-CM | POA: Insufficient documentation

## 2016-07-28 DIAGNOSIS — E1122 Type 2 diabetes mellitus with diabetic chronic kidney disease: Secondary | ICD-10-CM | POA: Diagnosis not present

## 2016-07-28 DIAGNOSIS — F419 Anxiety disorder, unspecified: Secondary | ICD-10-CM | POA: Insufficient documentation

## 2016-07-28 DIAGNOSIS — Z6823 Body mass index (BMI) 23.0-23.9, adult: Secondary | ICD-10-CM | POA: Diagnosis not present

## 2016-07-28 DIAGNOSIS — Z7902 Long term (current) use of antithrombotics/antiplatelets: Secondary | ICD-10-CM | POA: Diagnosis not present

## 2016-07-28 DIAGNOSIS — Z955 Presence of coronary angioplasty implant and graft: Secondary | ICD-10-CM | POA: Diagnosis not present

## 2016-07-28 DIAGNOSIS — J449 Chronic obstructive pulmonary disease, unspecified: Secondary | ICD-10-CM | POA: Diagnosis not present

## 2016-07-28 DIAGNOSIS — Z9889 Other specified postprocedural states: Secondary | ICD-10-CM

## 2016-07-28 DIAGNOSIS — Z8551 Personal history of malignant neoplasm of bladder: Secondary | ICD-10-CM | POA: Diagnosis not present

## 2016-07-28 DIAGNOSIS — I2510 Atherosclerotic heart disease of native coronary artery without angina pectoris: Secondary | ICD-10-CM | POA: Diagnosis not present

## 2016-07-28 DIAGNOSIS — Z882 Allergy status to sulfonamides status: Secondary | ICD-10-CM | POA: Diagnosis not present

## 2016-07-28 DIAGNOSIS — E785 Hyperlipidemia, unspecified: Secondary | ICD-10-CM | POA: Insufficient documentation

## 2016-07-28 DIAGNOSIS — E039 Hypothyroidism, unspecified: Secondary | ICD-10-CM | POA: Diagnosis not present

## 2016-07-28 LAB — GLUCOSE, CAPILLARY: GLUCOSE-CAPILLARY: 130 mg/dL — AB (ref 65–99)

## 2016-07-28 LAB — CBC
HCT: 30.9 % — ABNORMAL LOW (ref 36.0–46.0)
Hemoglobin: 9.7 g/dL — ABNORMAL LOW (ref 12.0–15.0)
MCH: 29.1 pg (ref 26.0–34.0)
MCHC: 31.4 g/dL (ref 30.0–36.0)
MCV: 92.8 fL (ref 78.0–100.0)
PLATELETS: 227 10*3/uL (ref 150–400)
RBC: 3.33 MIL/uL — ABNORMAL LOW (ref 3.87–5.11)
RDW: 16.6 % — AB (ref 11.5–15.5)
WBC: 7 10*3/uL (ref 4.0–10.5)

## 2016-07-28 LAB — PROTIME-INR
INR: 1.01
Prothrombin Time: 13.3 seconds (ref 11.4–15.2)

## 2016-07-28 LAB — APTT: aPTT: 33 seconds (ref 24–36)

## 2016-07-28 MED ORDER — MIDAZOLAM HCL 2 MG/2ML IJ SOLN
INTRAMUSCULAR | Status: AC | PRN
  Administered 2016-07-28: 0.5 mg via INTRAVENOUS
  Administered 2016-07-28: 1 mg via INTRAVENOUS

## 2016-07-28 MED ORDER — LIDOCAINE HCL (PF) 1 % IJ SOLN
INTRAMUSCULAR | Status: AC
  Filled 2016-07-28: qty 30

## 2016-07-28 MED ORDER — LIDOCAINE HCL 1 % IJ SOLN
INTRAMUSCULAR | Status: AC
  Filled 2016-07-28: qty 20

## 2016-07-28 MED ORDER — FENTANYL CITRATE (PF) 100 MCG/2ML IJ SOLN
INTRAMUSCULAR | Status: AC
  Filled 2016-07-28: qty 4

## 2016-07-28 MED ORDER — MIDAZOLAM HCL 2 MG/2ML IJ SOLN
INTRAMUSCULAR | Status: AC
  Filled 2016-07-28: qty 6

## 2016-07-28 MED ORDER — FENTANYL CITRATE (PF) 100 MCG/2ML IJ SOLN
INTRAMUSCULAR | Status: AC | PRN
  Administered 2016-07-28 (×2): 25 ug via INTRAVENOUS

## 2016-07-28 MED ORDER — SODIUM CHLORIDE 0.9 % IV SOLN: INTRAVENOUS | Status: DC

## 2016-07-28 NOTE — Procedures (Signed)
RLL lung mass Bx 18 g times three No comp/EBL

## 2016-07-28 NOTE — Discharge Instructions (Signed)
Lung Biopsy  A lung biopsy is a procedure to remove a tissue sample from the lung. The tissue can be examined under a microscope to help diagnose various lung disorders.  There are three types of lung biopsies:   Needle biopsy. In this type, the sample is removed with a needle.   Bronchoscopy. In this type, the sample is removed through a flexible tube inserted into the lungs (bronchoscope).   Open biopsy. In this type, the sample is removed through an incision made in the chest.    Tell a health care provider about:   Any allergies you have.   All medicines you are taking, including vitamins, herbs, eye drops, creams, and over-the-counter medicines.   Any problems you or family members have had with anesthetic medicines.   Any blood disorders or bleeding problems that you have.   Any surgeries you have had.   Any medical conditions you have.   Whether you are pregnant or may be pregnant.  What are the risks?  Generally, this is a safe procedure. However, problems may occur, including:   Collapsed lung.   Bleeding.   Infection.   Pain.    What happens before the procedure?  Staying hydrated  Follow instructions from your health care provider about hydration, which may include:   Up to 2 hours before the procedure - you may continue to drink clear liquids, such as water, clear fruit juice, black coffee, and plain tea.    Eating and drinking restrictions  Follow instructions from your health care provider about eating and drinking, which may include:   8 hours before the procedure - stop eating heavy meals or foods such as meat, fried foods, or fatty foods.   6 hours before the procedure - stop eating light meals or foods, such as toast or cereal.   6 hours before the procedure - stop drinking milk or drinks that contain milk.   2 hours before the procedure - stop drinking clear liquids.    General instructions   Ask your health care provider about:  ? Changing or stopping your regular medicines.  This is especially important if you take diabetes medicines or blood thinners.  ? Taking medicines such as aspirin and ibuprofen. These medicines can thin your blood. Do not take these medicines before your procedure if your health care provider instructs you not to.   Plan to have someone take you home from the hospital or clinic.  What happens during the procedure?   To lower your risk of infection:  ? Your health care team will wash or sanitize their hands.  ? If you are having an open biopsy, your skin will be washed with soap.   An IV tube may be inserted into one of your veins.   You may be given one or both of the following:  ? A medicine to help you relax (sedative) during the procedure.  ? A medicine to numb the area where the biopsy sample will be taken (local anesthetic).  ? A medicine to make you sleep through the procedure (general anesthetic).   If you have a needle biopsy:  ? A biopsy needle will be inserted into your lung. A CT scanner may be used to guide the needle to the right place.  ? The needle will be used to collect the tissue sample.   If you have bronchoscopy:  ? A bronchoscope will be inserted into your lungs through your mouth or nose.  ? A   sutures). The procedure may vary among health care providers and hospitals. What happens after the procedure?  Your blood pressure, heart rate, breathing rate, and blood oxygen level will be monitored until the medicines you were given have worn off.  If a needle biopsy was performed, a bandage (dressing) will be applied over the area where the needle was inserted. You may be asked to apply pressure to the bandage for several minutes to ensure there  is minimal bleeding.  If a bronchoscope was used, you may have a cough and some soreness in your throat.  Do not drive for 24 hours if you were given a sedative. Summary  A lung biopsy is a procedure to remove a tissue sample from your lung. The sample is examined to help diagnose various lung disorders.  A lung biopsy may be done using a needle, by inserting a tube into the lungs, or through an incision in the chest.  After your lung biopsy, you may have a cough and some throat soreness. This information is not intended to replace advice given to you by your health care provider. Make sure you discuss any questions you have with your health care provider. Document Released: 07/10/2004 Document Revised: 03/12/2016 Document Reviewed: 03/12/2016 Elsevier Interactive Patient Education  2017 [employer] Needle Biopsy of the Lung, Care After This sheet gives you information about how to care for yourself after your procedure. Your health care provider may also give you more specific instructions. If you have problems or questions, contact your health care provider. What can I expect after the procedure? After the procedure, it is common to have:  Soreness, pain, and tenderness where a tissue sample was taken (biopsy site).  A cough.  A sore throat. Follow these instructions at home: Biopsy site care   Follow instructions from your health care provider about when to remove the bandage that was placed on the biopsy site.  Keep the bandage dry until it has been removed.  Check your biopsy site every day for signs of infection. Check for:  More redness, swelling, or pain.  More fluid or blood.  Warmth to the touch.  Pus or a bad smell. General instructions   Rest as directed by your health care provider. Ask your health care provider what activities are safe for you.  Do not take baths, swim, or use a hot tub until your health care provider approves.  Take over-the-counter  and prescription medicines only as told by your health care provider.  If you have airplane travel scheduled, talk with your health care provider about when it is safe for you to travel by airplane.  It is up to you to get the results of your procedure. Ask your health care provider, or the department that is doing the procedure, when your results will be ready.  Keep all follow-up visits as told by your health care provider. This is important. Contact a health care provider if:  You have more redness, swelling, or pain around your biopsy site.  You have more fluid or blood coming from your biopsy site.  Your biopsy site feels warm to the touch.  You have pus or a bad smell coming from your biopsy site.  You have a fever.  You have pain that does not get better with medicine. Get help right away if:  You have problems breathing.  You have chest pain.  You cough up blood.  You faint.  You have a fast heart rate. Summary  After a needle biopsy of the lung, it is common to have a cough, a sore throat, or soreness, pain, and tenderness where a tissue sample was taken (biopsy site).  You should check your biopsy area every day for signs of infection, including pus or a bad smell, warmth, more fluid or blood, or more redness, swelling, or pain.  You should not take baths, swim, or use a hot tub until your health care provider approves.  It is up to you to get the results of your procedure. Ask your health care provider, or the department that is doing the procedure, when your results will be ready. This information is not intended to replace advice given to you by your health care provider. Make sure you discuss any questions you have with your health care provider. Document Released: 02/16/2007 Document Revised: 03/12/2016 Document Reviewed: 03/12/2016 Elsevier Interactive Patient Education  2017 [place]. Moderate Conscious Sedation, Adult, Care After These instructions  provide you with information about caring for yourself after your procedure. Your health care provider may also give you more specific instructions. Your treatment has been planned according to current medical practices, but problems sometimes occur. Call your health care provider if you have any problems or questions after your procedure. What can I expect after the procedure? After your procedure, it is common:  To feel sleepy for several hours.  To feel clumsy and have poor balance for several hours.  To have poor judgment for several hours.  To vomit if you eat too soon. Follow these instructions at home: For at least 24 hours after the procedure:    Do not:  Participate in activities where you could fall or become injured.  Drive.  Use heavy machinery.  Drink alcohol.  Take sleeping pills or medicines that cause drowsiness.  Make important decisions or sign legal documents.  Take care of children on your own.  Rest. Eating and drinking   Follow the diet recommended by your health care provider.  If you vomit:  Drink water, juice, or soup when you can drink without vomiting.  Make sure you have little or no nausea before eating solid foods. General instructions   Have a responsible adult stay with you until you are awake and alert.  Take over-the-counter and prescription medicines only as told by your health care provider.  If you smoke, do not smoke without supervision.  Keep all follow-up visits as told by your health care provider. This is important. Contact a health care provider if:  You keep feeling nauseous or you keep vomiting.  You feel light-headed.  You develop a rash.  You have a fever. Get help right away if:  You have trouble breathing. This information is not intended to replace advice given to you by your health care provider. Make sure you discuss any questions you have with your health care provider. Document Released: 02/09/2013  Document Revised: 09/24/2015 Document Reviewed: 08/11/2015 Elsevier Interactive Patient Education  2017 [employer].

## 2016-07-28 NOTE — H&P (Signed)
Chief Complaint: Patient was seen in consultation today for right lung mass biopsy at the request of Erica,Rakesh Jimenez  Referring Physician(s): Rigoberto Noel  Supervising Physician: Marybelle Killings  Patient Status: [facility] - Out-pt  History of Present Illness: Erica Jimenez is a 83 y.o. female   Hx Bladder Ca 6 yrs ago Hx CVA/TIA- on Plavix -- last dose 7-8 days ago Hx ESRD; CAD/MI; COPD  Developed cough and hemoptysis Feb 2018 Quit smoking same month +wt loss Seen and evaluated with Erica Jimenez  CT 07/08/16: IMPRESSION: 1. Multiple large right lung masses with probable endobronchial spread of tumor as described above. PET-CT may be helpful for staging purposes. Endobronchial biopsy may be the easiest route of obtaining tissue. 2. Emphysematous changes and pulmonary scarring. 3. No findings for upper abdominal metastatic disease or osseous metastatic disease  Now scheduled for right lung mass biopsy   Past Medical History:  Diagnosis Date  . 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 Alopecia   . Anemia   . Anxiety   . Aortic stenosis   . Arthritis    gout  . Bladder cancer [facility])     s/p resection and BCG treatment.   Marland Kitchen CAD (coronary artery disease)     Pt is s/p anterior MI in 1996 with stent placed in LAD.  Last myoview in our office was in 7/05 and  showed apical anterior, apical, and apical inferior infarct.  Minimal peri-infarct ischemia.  EF 49%.   . Cardiomyopathy, ischemic   . CKD (chronic kidney disease)     Pt is stage III-IV.  She had a fistula placed but is not on dialysis.  She follows with Erica. Lorrene Reid for  nephrology.   Marland Kitchen COPD (chronic obstructive pulmonary disease) ([place])   . CVA (cerebral vascular accident) ([place])   . DM (diabetes mellitus) ([place])   . Full dentures   . Gout   . History of glaucoma   . HOH (hard of hearing)    slightly  . HTN (hypertension)   . Hyperlipidemia   . Hypothyroidism   . Myocardial infarction   . PAD (peripheral  artery disease) ([facility])     arterial doppler study 12/04 suggestive of > 50% bilateral SFA stenosis.  Pt has mild claudication.  No invasive evaluation given CKD and desire to avoid contrast use.   Marland Kitchen Shortness of breath dyspnea    on exertion  . Wears glasses     Past Surgical History:  Procedure Laterality Date  . ABDOMINAL AORTAGRAM N/A 08/17/2013   Procedure: ABDOMINAL Maxcine Ham;  Surgeon: Wellington Hampshire, MD;  Location: [facility];  Service: Cardiovascular;  Laterality: N/A;  . ANGIOPLASTY    . CYSTOSCOPY/RETROGRADE/URETEROSCOPY Bilateral 01/09/2015   Procedure: CYSTOSCOPY BILATERAL RETROGRADE PYELOGRAM, WASHINGS RENAL PELVIS & BLADDER ;  Surgeon: Lowella Bandy, MD;  Location: [facility];  Service: Urology;  Laterality: Bilateral;  . CYSTOSTOMY W/ BLADDER BIOPSY    . EYE SURGERY Bilateral    cataract w IOL    Allergies: Contrast media [iodinated diagnostic agents]; Fluoxetine; and Sulfonamide derivatives  Medications: Prior to Admission medications   Medication Sig Start Date End Date Taking? Authorizing Provider  ALPRAZolam Duanne Moron) 0.25 MG tablet Take 0.25 mg by mouth daily as needed for anxiety.   Yes Historical Provider, MD  amLODipine (NORVASC) 5 MG tablet take 1 tablet by mouth once daily 06/11/15  Yes Larey Dresser, MD  aspirin  81 MG tablet Take 81 mg by mouth daily.     Yes Historical Provider, MD  atorvastatin (LIPITOR) 80 MG tablet Take 80 mg by mouth daily.     Yes Historical Provider, MD  bisoprolol (ZEBETA) 5 MG tablet Take 0.5 tablets (2.5 mg total) by mouth daily. *Please keep upcoming appointment for further refills* 07/17/16  Yes Larey Dresser, MD  brinzolamide (AZOPT) 1 % ophthalmic suspension Place 1 drop into both eyes daily.    Yes Historical Provider, MD  calcitRIOL (ROCALTROL) 0.25 MCG capsule Take 0.25 mcg by mouth 3 (three) times a week. Monday, Wednesday, Friday   Yes Historical Provider, MD  Carboxymethylcellulose Sodium (REFRESH TEARS OP) Apply  1 drop to eye 2 (two) times daily. Both eyes.   Yes Historical Provider, MD  citalopram (CELEXA) 20 MG tablet Take 20 mg by mouth at bedtime.   Yes Historical Provider, MD  febuxostat (ULORIC) 40 MG tablet Take 40 mg by mouth daily.   Yes Historical Provider, MD  Ferrous Gluconate (FERGON PO) Take 1 tablet by mouth daily.    Yes Historical Provider, MD  FOLIC ACID PO Take 2.2 mg by mouth daily.    Yes Historical Provider, MD  furosemide (LASIX) 40 MG tablet Take 40 mg by mouth daily as needed.    Yes Historical Provider, MD  HUMALOG KWIKPEN 100 UNIT/ML KiwkPen Inject 2-5 Units as directed 3 (three) times daily. 05/16/15  Yes Historical Provider, MD  hydrALAZINE (APRESOLINE) 50 MG tablet Take 1 tablet (50 mg total) by mouth 2 (two) times daily. *Please keep 07/23/16 appointment for further refills* 07/11/16  Yes Larey Dresser, MD  isosorbide mononitrate (IMDUR) 60 MG 24 hr tablet Take 60 mg by mouth daily.     Yes Historical Provider, MD  LANTUS SOLOSTAR 100 UNIT/ML Solostar Pen Inject 12 Units as directed daily.  02/14/15  Yes Historical Provider, MD  latanoprost (XALATAN) 0.005 % ophthalmic solution Place 1 drop into both eyes at bedtime.    Yes Historical Provider, MD  levothyroxine (SYNTHROID, LEVOTHROID) 50 MCG tablet Take 50 mcg by mouth daily before breakfast.    Yes Historical Provider, MD  mirtazapine (REMERON) 30 MG tablet Take 15 mg by mouth at bedtime. 06/18/16  Yes Historical Provider, MD  nitroGLYCERIN (NITROSTAT) 0.4 MG SL tablet Place 1 tablet (0.4 mg total) under the tongue every 5 (five) minutes as needed for chest pain. 10/26/14  Yes Larey Dresser, MD  senna (SENOKOT) 8.6 MG tablet Take 1 tablet by mouth as needed for constipation.   Yes Historical Provider, MD  clopidogrel (PLAVIX) 75 MG tablet Take 75 mg by mouth daily.      Historical Provider, MD     Family History  Problem Relation Age of Onset  . Breast cancer Mother   . Tuberculosis Father   . Heart disease Maternal Aunt      Social History   Social History  . Marital status: Widowed    Spouse name: N/A  . Number of children: N/A  . Years of education: N/A   Social History Main Topics  . Smoking status: Former Smoker    Packs/day: 0.35    Years: 60.00    Types: Cigarettes    Quit date: 06/30/2016  . Smokeless tobacco: Never Used  . Alcohol use 0.6 oz/week    1 Shots of liquor per week  . Drug use: No  . Sexual activity: Not Asked   Other Topics Concern  . None   Social  History Narrative  . None    Review of Systems: A 12 point ROS discussed and pertinent positives are indicated in the HPI above.  All other systems are negative.  Review of Systems  Constitutional: Positive for activity change, appetite change, fatigue and unexpected weight change. Negative for fever.  Respiratory: Positive for cough and shortness of breath.   Cardiovascular: Negative for chest pain.  Gastrointestinal: Negative for abdominal pain.  Neurological: Positive for weakness.  Psychiatric/Behavioral: Negative for behavioral problems and confusion.    Vital Signs: BP (!) 146/57   Pulse (!) 48   Temp 97.9 F (36.6 C) (Oral)   Resp 18   Ht 5' 4.5" (1.638 m)   Wt 137 lb (62.1 kg)   SpO2 100%   BMI 23.15 kg/m   Physical Exam  Constitutional: She is oriented to person, place, and time.  Cardiovascular: Normal rate and regular rhythm.   Murmur heard. Known Aortic stenosis  Pulmonary/Chest: Effort normal and breath sounds normal. She has no wheezes.  Abdominal: Soft. There is no tenderness.  Musculoskeletal: Normal range of motion.  Neurological: She is alert and oriented to person, place, and time.  Skin: Skin is warm and dry.  Psychiatric: She has a normal mood and affect. Her behavior is normal. Judgment and thought content normal.  Nursing note and vitals reviewed.   Mallampati Score:  MD Evaluation Airway: WNL Heart: WNL Abdomen: WNL Chest/ Lungs: WNL ASA  Classification: 3  Imaging: Ct  Chest Wo Contrast  Result Date: 07/08/2016 CLINICAL DATA:  History of cough and episode of hemoptysis. EXAM: CT CHEST WITHOUT CONTRAST TECHNIQUE: Multidetector CT imaging of the chest was performed following the standard protocol without IV contrast. COMPARISON:  Chest CT 05/12/2012 FINDINGS: Chest wall: No breast masses, supraclavicular or axillary lymphadenopathy. The thyroid gland is grossly normal. Cardiovascular: The heart is normal in size. No pericardial effusion. There is tortuosity and dense calcification involving the thoracic aorta. Three-vessel coronary artery calcifications are also noted. Enlarged pulmonary arteries consistent with pulmonary hypertension. Mediastinum/Nodes: Scattered mediastinal and hilar lymph nodes are noted. The largest node is in the precarinal region and measures 17 x 17 mm on image number 58. This previously measured 13 x 8 mm. The esophagus is grossly normal. Lungs/Pleura: Large central right upper lobe lung mass surrounding and obstructing a right upper lobe bronchus. This measures a maximum of 3.6 x 3.4 cm and there is evidence of endobronchial spread of tumor up into the right lung apex. Enlarging right apical lung mass and small satellite pulmonary lesions. Similar central right lower lung mass surrounding an obstructing right lower lobe bronchi with evidence of endobronchial spread of tumor. This lesion measures 3.7 x 3.5 cm. There is a second mass in the superior segment of the right lower lobe which measures 3.8 x 2.5 cm on image number 76. This is likely secondary to endobronchial spread of tumor from the right hilar mass. Small left upper lobe pulmonary nodule on image 18 is stable. No left-sided pulmonary masses. Extensive underlying emphysematous changes and pulmonary scarring. Calcified granuloma noted at the right lung base. Upper Abdomen: No significant upper abdominal findings. Advanced atherosclerotic calcifications involving the abdominal aorta and branch  vessels. No findings to suggest hepatic or adrenal gland metastasis. Musculoskeletal: Diffuse osteoporosis but no definite destructive/lytic bone lesions. IMPRESSION: 1. Multiple large right lung masses with probable endobronchial spread of tumor as described above. PET-CT may be helpful for staging purposes. Endobronchial biopsy may be the easiest route of obtaining tissue.  2. Emphysematous changes and pulmonary scarring. 3. No findings for upper abdominal metastatic disease or osseous metastatic disease. Electronically Signed   By: Marijo Sanes M.D.   On: 07/08/2016 15:52    Labs:  CBC: No results for input(s): WBC, HGB, HCT, PLT in the last 8760 hours.  COAGS: No results for input(s): INR, APTT in the last 8760 hours.  BMP: No results for input(s): NA, K, CL, CO2, GLUCOSE, BUN, CALCIUM, CREATININE, GFRNONAA, GFRAA in the last 8760 hours.  Invalid input(s): CMP  LIVER FUNCTION TESTS: No results for input(s): BILITOT, AST, ALT, ALKPHOS, PROT, ALBUMIN in the last 8760 hours.  TUMOR MARKERS: No results for input(s): AFPTM, CEA, CA199, CHROMGRNA in the last 8760 hours.  Assessment and Plan:  Right lung mass For biopsy day per Erica Jimenez Risks and Benefits discussed with the patient including, but not limited to bleeding, hemoptysis, respiratory failure requiring intubation, infection, pneumothorax requiring chest tube placement, stroke from air embolism or even death. All of the patient's questions were answered, patient is agreeable to proceed. Consent signed and in chart.  Thank you for this interesting consult.  I greatly enjoyed meeting AYMARA SASSI and look forward to participating in their care.  A copy of this report was sent to the requesting provider on this date.  Electronically Signed: Monia Sabal A 07/28/2016, 10:36 AM   I spent a total of  30 Minutes   in face to face in clinical consultation, greater than 50% of which was counseling/coordinating care for right lung  mass bx

## 2016-07-30 ENCOUNTER — Ambulatory Visit (HOSPITAL_COMMUNITY)
Admission: RE | Admit: 2016-07-30 | Discharge: 2016-07-30 | Disposition: A | Discharge status: Home / Self Care | Payer: Medicare Other | Source: Ambulatory Visit | Attending: Pulmonary Disease | Admitting: Pulmonary Disease

## 2016-07-30 ENCOUNTER — Encounter: Payer: Medicare Other | Admitting: Cardiovascular Disease

## 2016-07-30 DIAGNOSIS — R918 Other nonspecific abnormal finding of lung field: Secondary | ICD-10-CM | POA: Insufficient documentation

## 2016-07-30 DIAGNOSIS — I700 Atherosclerosis of aorta: Secondary | ICD-10-CM | POA: Insufficient documentation

## 2016-07-30 DIAGNOSIS — J984 Other disorders of lung: Secondary | ICD-10-CM | POA: Diagnosis not present

## 2016-07-30 DIAGNOSIS — R590 Localized enlarged lymph nodes: Secondary | ICD-10-CM | POA: Diagnosis not present

## 2016-07-30 DIAGNOSIS — I2510 Atherosclerotic heart disease of native coronary artery without angina pectoris: Secondary | ICD-10-CM | POA: Insufficient documentation

## 2016-07-30 LAB — GLUCOSE, CAPILLARY: GLUCOSE-CAPILLARY: 162 mg/dL — AB (ref 65–99)

## 2016-07-30 MED ORDER — FLUDEOXYGLUCOSE F - 18 (FDG) INJECTION
6.8000 | Freq: Once | INTRAVENOUS | Status: AC | PRN
  Administered 2016-07-30: 6.8 via INTRAVENOUS

## 2016-07-31 ENCOUNTER — Other Ambulatory Visit: Payer: Self-pay

## 2016-07-31 DIAGNOSIS — R918 Other nonspecific abnormal finding of lung field: Secondary | ICD-10-CM

## 2016-08-04 ENCOUNTER — Telehealth: Payer: Self-pay | Admitting: *Deleted

## 2016-08-04 DIAGNOSIS — R918 Other nonspecific abnormal finding of lung field: Secondary | ICD-10-CM

## 2016-08-04 NOTE — Telephone Encounter (Signed)
Oncology Nurse Navigator Documentation  Oncology Nurse Navigator Flowsheets 08/04/2016  Navigator Location [facility]  Referral date to RadOnc/MedOnc 08/01/2016  Navigator Encounter Type Telephone/I received referral on Mr. Steinke on 3/30.  I updated Dr. [name].  I called her today to schedule her to be seen today.  She is unable to make appt today.  I will call her back with another appt. She verbalized understanding   Telephone Outgoing Call  Abnormal Finding Date 07/08/2016  Confirmed Diagnosis Date 07/29/2016  Treatment Phase Pre-Tx/Tx Discussion  Barriers/Navigation Needs Coordination of Care  Interventions Coordination of Care  Coordination of Care Appts  Acuity Level 2  Acuity Level 2 Assistance expediting appointments  Time Spent with Patient 30

## 2016-08-04 NOTE — Telephone Encounter (Signed)
Oncology Nurse Navigator Documentation  Oncology Nurse Navigator Flowsheets 08/04/2016  Navigator Location [facility]  Navigator Encounter Type Telephone/I called Ms. Tardiff back today with an appt for [place] next week. She verbalized understanding of appt time and place.   Telephone Outgoing Call  Treatment Phase Pre-Tx/Tx Discussion  Barriers/Navigation Needs Coordination of Care  Interventions Coordination of Care  Coordination of Care Appts  Acuity Level 1  Acuity Level 1 Minimal follow up required  Time Spent with Patient 15

## 2016-08-05 ENCOUNTER — Telehealth: Payer: Self-pay | Admitting: *Deleted

## 2016-08-05 NOTE — Telephone Encounter (Signed)
Oncology Nurse Navigator Documentation  Oncology Nurse Navigator Flowsheets 08/05/2016  Navigator Location [facility]  Navigator Encounter Type Telephone/I received an unpleasant call from Ms. Milbourne's daughter.  She is demanding patient be seen sooner.  I stated patient could be seen by another provider soon and is that something she was interested in.  I listed as she explained no it was not ok.  I updated Dr. [name] and he states he will see her tomorrow and work her in.  I called patient to updated.  Patient states she does not want to be seen earlier and that 08/14/16 is ok.  I asked that she call the daughter to updated on her wishes.  She states she would.  Dr. [name] updated.   Telephone Outgoing Call  Treatment Phase Pre-Tx/Tx Discussion  Barriers/Navigation Needs Coordination of Care  Interventions Coordination of Care  Coordination of Care Appts  Acuity Level 2  Time Spent with Patient 30

## 2016-08-07 ENCOUNTER — Other Ambulatory Visit (HOSPITAL_COMMUNITY)
Admission: RE | Admit: 2016-08-07 | Discharge: 2016-08-07 | Disposition: A | Discharge status: Home / Self Care | Payer: Medicare Other | Source: Ambulatory Visit | Attending: Internal Medicine | Admitting: Internal Medicine

## 2016-08-07 ENCOUNTER — Encounter: Payer: Self-pay | Admitting: *Deleted

## 2016-08-07 DIAGNOSIS — C3431 Malignant neoplasm of lower lobe, right bronchus or lung: Secondary | ICD-10-CM | POA: Diagnosis not present

## 2016-08-07 NOTE — Progress Notes (Signed)
Oncology Nurse Navigator Documentation  Oncology Nurse Navigator Flowsheets 08/07/2016  Navigator Location [facility]  Navigator Encounter Type Letter/Fax/Email/Mailed [place] letter   Treatment Phase Pre-Tx/Tx Discussion  Barriers/Navigation Needs Education  Education Other  Interventions Education  Education Method Written  Acuity Level 1  Time Spent with Patient 15

## 2016-08-07 NOTE — Progress Notes (Signed)
Oncology Nurse Navigator Documentation  Oncology Nurse Navigator Flowsheets 08/07/2016  Navigator Location [facility]  Navigator Encounter Type Other/per Cancer Conference discussion, I updated [facility] pathology dept to send tissue obtained on 07/28/16 for PDL 1 testing.    Treatment Phase Pre-Tx/Tx Discussion  Barriers/Navigation Needs Coordination of Care  Interventions Coordination of Care  Coordination of Care Other  Acuity Level 2  Time Spent with Patient 15

## 2016-08-10 ENCOUNTER — Other Ambulatory Visit: Payer: Self-pay | Admitting: Cardiology

## 2016-08-11 ENCOUNTER — Ambulatory Visit (INDEPENDENT_AMBULATORY_CARE_PROVIDER_SITE_OTHER): Payer: Medicare Other | Admitting: Ophthalmology

## 2016-08-11 DIAGNOSIS — E11319 Type 2 diabetes mellitus with unspecified diabetic retinopathy without macular edema: Secondary | ICD-10-CM | POA: Diagnosis not present

## 2016-08-11 DIAGNOSIS — E113393 Type 2 diabetes mellitus with moderate nonproliferative diabetic retinopathy without macular edema, bilateral: Secondary | ICD-10-CM

## 2016-08-11 DIAGNOSIS — H35033 Hypertensive retinopathy, bilateral: Secondary | ICD-10-CM

## 2016-08-11 DIAGNOSIS — H43813 Vitreous degeneration, bilateral: Secondary | ICD-10-CM | POA: Diagnosis not present

## 2016-08-11 DIAGNOSIS — I10 Essential (primary) hypertension: Secondary | ICD-10-CM

## 2016-08-12 ENCOUNTER — Ambulatory Visit (INDEPENDENT_AMBULATORY_CARE_PROVIDER_SITE_OTHER): Payer: Medicare Other | Admitting: Adult Health

## 2016-08-12 ENCOUNTER — Encounter: Payer: Self-pay | Admitting: Adult Health

## 2016-08-12 ENCOUNTER — Encounter (HOSPITAL_COMMUNITY): Payer: Self-pay

## 2016-08-12 DIAGNOSIS — R918 Other nonspecific abnormal finding of lung field: Secondary | ICD-10-CM

## 2016-08-12 DIAGNOSIS — I6523 Occlusion and stenosis of bilateral carotid arteries: Secondary | ICD-10-CM

## 2016-08-12 DIAGNOSIS — C3481 Malignant neoplasm of overlapping sites of right bronchus and lung: Secondary | ICD-10-CM

## 2016-08-12 DIAGNOSIS — C3490 Malignant neoplasm of unspecified part of unspecified bronchus or lung: Secondary | ICD-10-CM | POA: Insufficient documentation

## 2016-08-12 NOTE — Progress Notes (Signed)
$'@Patient'j$  ID: Erica Jimenez, female    DOB: 1929/09/06, 83 y.o.   MRN: [id number]  Chief Complaint  Patient presents with  . Follow-up    Referring provider: No ref. provider found  HPI: 83 year old female former smoker seen for pulmonary consult 07/21/2016 for right lung mass found to have squamous cell carcinoma  TEST  07/2016 CT chest >Multiple large right lung masses , emphysema  PET scan 07/30/16 > multifocal hypermetabolic lesions within the right upper lobe and right lower lobe . Largest measuring 4.3 cm. , hypermetabolic right hilar adenopathy and right paratracheal adenopathy.   08/12/2016 Follow up : Lung Cancer  Patient returns for a one-month follow-up. Patient was seen for pulmonary consult 07/21/2016 for right lung mass. She was set up for a PET scan that showed multifocal hypermetabolic lesions within the right upper lobe and right lower lobe. Largest measuring 4.3 cm. Hypermetabolic right hilar adenopathy and right paratracheal adenopathy.  She was set up for a CT-guided biopsy that was done on 07/28/2016 that was positive for small cell carcinoma. Patient has been set up with oncology, with a pending appointment on April 12 with Dr. Earlie Server. I reviewed these test results with patient and her daughter and answered questions . Support provided.  She has had weight loss. No hemoptysis .  She does Not carry a diagnosis of asthma or COPD.Marland Kitchen Previous PFTs have been done. She denies any  cough or shortness of breath.   Allergies  Allergen Reactions  . Contrast Media [Iodinated Diagnostic Agents] Other (See Comments)    Renal insufficiency  . Fluoxetine Rash  . Sulfonamide Derivatives Nausea Only    Immunization History  Administered Date(s) Administered  . Influenza Whole 02/03/2012  . Influenza, High Dose Seasonal PF 01/24/2016  . Pneumococcal Conjugate-13 09/15/2014  . Tdap 01/19/2013  . Zoster 10/12/2012    Past Medical History:  Diagnosis Date  . A-V fistula  ([place])    pt has 2  fistulas in rt arm  . Alopecia   . Anemia   . Anxiety   . Aortic stenosis   . Arthritis    gout  . Bladder cancer [facility])     s/p resection and BCG treatment.   Marland Kitchen CAD (coronary artery disease)     Pt is s/p anterior MI in 1996 with stent placed in LAD.  Last myoview in our office was in 7/05 and  showed apical anterior, apical, and apical inferior infarct.  Minimal peri-infarct ischemia.  EF 49%.   . Cardiomyopathy, ischemic   . CKD (chronic kidney disease)     Pt is stage III-IV.  She had a fistula placed but is not on dialysis.  She follows with Dr. Lorrene Reid for  nephrology.   Marland Kitchen COPD (chronic obstructive pulmonary disease) ([place])   . CVA (cerebral vascular accident) ([place])   . DM (diabetes mellitus) ([place])   . Full dentures   . Gout   . History of glaucoma   . HOH (hard of hearing)    slightly  . HTN (hypertension)   . Hyperlipidemia   . Hypothyroidism   . Myocardial infarction   . PAD (peripheral artery disease) ([facility])     arterial doppler study 12/04 suggestive of > 50% bilateral SFA stenosis.  Pt has mild claudication.  No invasive evaluation given CKD and desire to avoid contrast use.   Marland Kitchen Shortness of breath dyspnea    on exertion  . Wears glasses     Tobacco History: History  Smoking Status  . Former Smoker  . Packs/day: 0.35  . Years: 60.00  . Types: Cigarettes  . Quit date: 06/30/2016  Smokeless Tobacco  . Never Used   Counseling given: Not Answered   Outpatient Encounter Prescriptions as of 08/12/2016  Medication Sig  . ALPRAZolam (XANAX) 0.25 MG tablet Take 0.25 mg by mouth daily as needed for anxiety.  Marland Kitchen amLODipine (NORVASC) 5 MG tablet take 1 tablet by mouth once daily  . aspirin 81 MG tablet Take 81 mg by mouth daily.    Marland Kitchen atorvastatin (LIPITOR) 80 MG tablet Take 80 mg by mouth daily.    . bisoprolol (ZEBETA) 5 MG tablet Take 0.5 tablets (2.5 mg total) by mouth daily. *Please keep upcoming appointment for further refills*  . brinzolamide  (AZOPT) 1 % ophthalmic suspension Place 1 drop into both eyes daily.   . calcitRIOL (ROCALTROL) 0.25 MCG capsule Take 0.25 mcg by mouth 3 (three) times a week. Monday, Wednesday, Friday  . Carboxymethylcellulose Sodium (REFRESH TEARS OP) Apply 1 drop to eye 2 (two) times daily. Both eyes.  . citalopram (CELEXA) 20 MG tablet Take 20 mg by mouth at bedtime.  . clopidogrel (PLAVIX) 75 MG tablet Take 75 mg by mouth daily.    . febuxostat (ULORIC) 40 MG tablet Take 40 mg by mouth daily.  . Ferrous Gluconate (FERGON PO) Take 1 tablet by mouth daily.   Marland Kitchen FOLIC ACID PO Take 2.2 mg by mouth daily.   . furosemide (LASIX) 40 MG tablet Take 40 mg by mouth daily as needed.   Marland Kitchen HUMALOG KWIKPEN 100 UNIT/ML KiwkPen Inject 2-5 Units as directed 3 (three) times daily.  . hydrALAZINE (APRESOLINE) 50 MG tablet Take 1 tablet (50 mg total) by mouth 2 (two) times daily.  . isosorbide mononitrate (IMDUR) 60 MG 24 hr tablet Take 60 mg by mouth daily.    Marland Kitchen LANTUS SOLOSTAR 100 UNIT/ML Solostar Pen Inject 12 Units as directed daily.   Marland Kitchen latanoprost (XALATAN) 0.005 % ophthalmic solution Place 1 drop into both eyes at bedtime.   Marland Kitchen levothyroxine (SYNTHROID, LEVOTHROID) 50 MCG tablet Take 50 mcg by mouth daily before breakfast.   . mirtazapine (REMERON) 30 MG tablet Take 15 mg by mouth at bedtime.  . nitroGLYCERIN (NITROSTAT) 0.4 MG SL tablet Place 1 tablet (0.4 mg total) under the tongue every 5 (five) minutes as needed for chest pain.  Marland Kitchen Phenylephrine-APAP-Guaifenesin (MUCINEX SINUS-MAX CONGESTION) 10-650-400 MG/20ML LIQD Per bottle as needed  . senna (SENOKOT) 8.6 MG tablet Take 1 tablet by mouth as needed for constipation.   No facility-administered encounter medications on file as of 08/12/2016.      Review of Systems  Constitutional:   No  weight loss, night sweats,  Fevers, chills, + fatigue, or  lassitude.  HEENT:   No headaches,  Difficulty swallowing,  Tooth/dental problems, or  Sore throat,                No  sneezing, itching, ear ache, nasal congestion, post nasal drip,   CV:  No chest pain,  Orthopnea, PND, swelling in lower extremities, anasarca, dizziness, palpitations, syncope.   GI  No heartburn, indigestion, abdominal pain, nausea, vomiting, diarrhea, change in bowel habits, loss of appetite, bloody stools.   Resp: No shortness of breath with exertion or at rest.  No excess mucus, no productive cough,  No non-productive cough,  No coughing up of blood.  No change in color of mucus.  No wheezing.  No chest wall  deformity  Skin: no rash or lesions.  GU: no dysuria, change in color of urine, no urgency or frequency.  No flank pain, no hematuria   MS:  No joint pain or swelling.  No decreased range of motion.  No back pain.    Physical Exam  BP 122/64 (BP Location: Left Arm, Cuff Size: Normal)   Pulse (!) 50   Ht 5' 4.5" (1.638 m)   Wt 142 lb (64.4 kg)   SpO2 100%   BMI 24.00 kg/m   GEN: A/Ox3; pleasant , NAD, elderly    HEENT:  [place]/AT,  EACs-clear, TMs-wnl, NOSE-clear, THROAT-clear, no lesions, no postnasal drip or exudate noted.   NECK:  Supple w/ fair ROM; no JVD; normal carotid impulses w/o bruits; no thyromegaly or nodules palpated; no lymphadenopathy.    RESP  Clear  P & A; w/o, wheezes/ rales/ or rhonchi. no accessory muscle use, no dullness to percussion  CARD:  RRR, no m/r/g, no peripheral edema, pulses intact, no cyanosis or clubbing.  GI:   Soft & nt; nml bowel sounds; no organomegaly or masses detected.   Musco: Warm bil, no deformities or joint swelling noted.   Neuro: alert, no focal deficits noted.    Skin: Warm, no lesions or rashes    Lab Results:  CBC    Component Value Date/Time   WBC 7.0 07/28/2016 1027   RBC 3.33 (L) 07/28/2016 1027   HGB 9.7 (L) 07/28/2016 1027   HCT 30.9 (L) 07/28/2016 1027   PLT 227 07/28/2016 1027   MCV 92.8 07/28/2016 1027   MCH 29.1 07/28/2016 1027   MCHC 31.4 07/28/2016 1027   RDW 16.6 (H) 07/28/2016 1027    LYMPHSABS 2.3 05/11/2010 0009   MONOABS 0.5 05/11/2010 0009   EOSABS 0.2 05/11/2010 0009   BASOSABS 0.0 05/11/2010 0009    BMET    Component Value Date/Time   NA 135 01/09/2015 1123   K 4.0 01/09/2015 1123   CL 116 (H) 01/09/2015 1123   CO2 24 06/29/2014 1624   GLUCOSE 157 (H) 01/09/2015 1123   BUN 53 (H) 01/09/2015 1123   CREATININE 2.20 (H) 01/09/2015 1123   CALCIUM 10.3 06/29/2014 1624   GFRNONAA 21 (L) 08/17/2013 0758   GFRAA 25 (L) 08/17/2013 0758    BNP No results found for: BNP  ProBNP    Component Value Date/Time   PROBNP 136.0 (H) 03/14/2011 0905    Imaging: Nm Pet Image Initial (pi) Skull Base To Thigh  Result Date: 07/30/2016 CLINICAL DATA:  Initial treatment strategy for lung mass. EXAM: NUCLEAR MEDICINE PET SKULL BASE TO THIGH TECHNIQUE: 6.8 mCi F-18 FDG was injected intravenously. Full-ring PET imaging was performed from the skull base to thigh after the radiotracer. CT data was obtained and used for attenuation correction and anatomic localization. FASTING BLOOD GLUCOSE:  Value: 162 mg/dl COMPARISON:  07/08/2016 FINDINGS: NECK No hypermetabolic lymph nodes in the neck. CHEST The heart size appears mildly enlarged. No pericardial effusion. Aortic atherosclerosis. Calcification in the RCA and LAD coronary artery noted. Right paratracheal lymph node measures 1.6 cm and has an SUV max equal to 5.95. Enlarged right hilar lymph node measures 1.6 cm and has an SUV max equal to 9.8, image 66 of series 4. Right upper lobe hypermetabolic mass measures 3.1 cm and has an SUV max equal to 6.8. There is evidence of endobronchial spread of tumor into the right upper lobe and right apex. Nodule in the right apex measures 3.3 cm and has  an SUV max equal to 6.9. There is a hypermetabolic mass within the superior segment of the right lower lobe which measures 4.3 cm and has an SUV max equal to 14.69. There is postobstructive pneumonitis involving the right lung base. Small nodule within  the left apex measures 5 mm and is too small to reliably characterize. Calcified granuloma noted in the right lung base. ABDOMEN/PELVIS No abnormal hypermetabolic activity within the liver, pancreas, adrenal glands, or spleen. No hypermetabolic lymph nodes in the abdomen or pelvis. SKELETON No focal hypermetabolic activity to suggest skeletal metastasis. IMPRESSION: 1. Multifocal hypermetabolic lesions are identified within the right upper lobe and right lower lobe. There is also hypermetabolic right hilar adenopathy and right paratracheal adenopathy. Assuming non-small cell histology imaging findings would be compatible with T4N2M0 or stage IIIb disease. 2. Aortic atherosclerosis. Multi vessel coronary artery calcifications noted. Electronically Signed   By: Kerby Moors M.D.   On: 07/30/2016 14:47   Ct Biopsy  Result Date: 07/28/2016 INDICATION: Right lower lobe lung mass EXAM: CT BIOPSY MEDICATIONS: None. ANESTHESIA/SEDATION: Fentanyl 50 mcg IV; Versed 1.5 mg IV Moderate Sedation Time:  11 The patient was continuously monitored during the procedure by the interventional radiology nurse under my direct supervision. FLUOROSCOPY TIME:  None. COMPLICATIONS: None immediate. PROCEDURE: Informed written consent was obtained from the patient after a thorough discussion of the procedural risks, benefits and alternatives. All questions were addressed. Maximal Sterile Barrier Technique was utilized including caps, mask, sterile gowns, sterile gloves, sterile drape, hand hygiene and skin antiseptic. A timeout was performed prior to the initiation of the procedure. Under CT guidance, a(n) 17 gauge guide needle was advanced into the right lower lobe lung mass. Subsequently 3 18 gauge core biopsies were obtained. The guide needle was removed. Post biopsy images demonstrate no pneumothorax. Patient tolerated the procedure well without complication. Vital sign monitoring by nursing staff during the procedure will continue  as patient is in the special procedures unit for post procedure observation. FINDINGS: The images document guide needle placement within the right lower lobe lung mass. Post biopsy images demonstrate no pneumothorax. IMPRESSION: Successful CT-guided right lower lobe lung mass biopsy. Electronically Signed   By: Marybelle Killings M.D.   On: 07/28/2016 14:04   Dg Chest Port 1 View  Result Date: 07/28/2016 CLINICAL DATA:  Biopsy EXAM: PORTABLE CHEST 1 VIEW COMPARISON:  07/28/2016 FINDINGS: There is no pneumothorax after right lower lobe lung biopsy. Cardiomegaly persists. Right apical lung mass persists. Central right basilar lung mass and associated right lower lobe opacity is stable. Left lung is relatively clear. IMPRESSION: No pneumothorax post right lung biopsy. Electronically Signed   By: Marybelle Killings M.D.   On: 07/28/2016 13:57     Assessment & Plan:   No problem-specific Assessment & Plan notes found for this encounter.     Rexene Edison, NP 08/12/2016

## 2016-08-12 NOTE — Patient Instructions (Signed)
Follow up with Dr. [name] as planned this week  Follow up with Pulmonary As needed

## 2016-08-12 NOTE — Assessment & Plan Note (Signed)
Positive for lung cancer in former smoker

## 2016-08-12 NOTE — Assessment & Plan Note (Addendum)
Right sided lung cancer (Multiple lung masses on right with PET positive , + biopsy for squamous cell carcinoma.)  Test results reviewed with patient and her daughter. Patient has a upcoming appointment with Dr. Earlie Server this week on April 12. She is to keep follow up with ONcology .  Can follow up with pulmonary As needed   Hold on PFT , can set if need going forward.

## 2016-08-14 ENCOUNTER — Encounter: Payer: Self-pay | Admitting: Internal Medicine

## 2016-08-14 ENCOUNTER — Ambulatory Visit
Admission: RE | Admit: 2016-08-14 | Discharge: 2016-08-14 | Disposition: A | Discharge status: Home / Self Care | Payer: Medicare Other | Source: Ambulatory Visit | Attending: Radiation Oncology | Admitting: Radiation Oncology

## 2016-08-14 ENCOUNTER — Other Ambulatory Visit (HOSPITAL_BASED_OUTPATIENT_CLINIC_OR_DEPARTMENT_OTHER): Payer: Medicare Other

## 2016-08-14 ENCOUNTER — Encounter: Payer: Self-pay | Admitting: *Deleted

## 2016-08-14 ENCOUNTER — Ambulatory Visit: Payer: Medicare Other | Attending: Internal Medicine | Admitting: Physical Therapy

## 2016-08-14 ENCOUNTER — Telehealth: Payer: Self-pay | Admitting: Medical Oncology

## 2016-08-14 ENCOUNTER — Telehealth: Payer: Self-pay | Admitting: Internal Medicine

## 2016-08-14 ENCOUNTER — Ambulatory Visit (HOSPITAL_BASED_OUTPATIENT_CLINIC_OR_DEPARTMENT_OTHER): Payer: Medicare Other | Admitting: Internal Medicine

## 2016-08-14 VITALS — BP 141/48 | HR 58 | Temp 98.4°F | Resp 17 | Ht 64.5 in | Wt 142.5 lb

## 2016-08-14 DIAGNOSIS — C3491 Malignant neoplasm of unspecified part of right bronchus or lung: Secondary | ICD-10-CM

## 2016-08-14 DIAGNOSIS — M6281 Muscle weakness (generalized): Secondary | ICD-10-CM | POA: Insufficient documentation

## 2016-08-14 DIAGNOSIS — R262 Difficulty in walking, not elsewhere classified: Secondary | ICD-10-CM | POA: Insufficient documentation

## 2016-08-14 DIAGNOSIS — C771 Secondary and unspecified malignant neoplasm of intrathoracic lymph nodes: Secondary | ICD-10-CM | POA: Diagnosis not present

## 2016-08-14 DIAGNOSIS — I10 Essential (primary) hypertension: Secondary | ICD-10-CM

## 2016-08-14 DIAGNOSIS — J449 Chronic obstructive pulmonary disease, unspecified: Secondary | ICD-10-CM

## 2016-08-14 DIAGNOSIS — R599 Enlarged lymph nodes, unspecified: Secondary | ICD-10-CM

## 2016-08-14 DIAGNOSIS — Z803 Family history of malignant neoplasm of breast: Secondary | ICD-10-CM | POA: Diagnosis not present

## 2016-08-14 DIAGNOSIS — Z8551 Personal history of malignant neoplasm of bladder: Secondary | ICD-10-CM

## 2016-08-14 DIAGNOSIS — Z87891 Personal history of nicotine dependence: Secondary | ICD-10-CM

## 2016-08-14 DIAGNOSIS — E119 Type 2 diabetes mellitus without complications: Secondary | ICD-10-CM

## 2016-08-14 DIAGNOSIS — R918 Other nonspecific abnormal finding of lung field: Secondary | ICD-10-CM

## 2016-08-14 DIAGNOSIS — J984 Other disorders of lung: Secondary | ICD-10-CM

## 2016-08-14 DIAGNOSIS — C3431 Malignant neoplasm of lower lobe, right bronchus or lung: Secondary | ICD-10-CM

## 2016-08-14 DIAGNOSIS — N189 Chronic kidney disease, unspecified: Secondary | ICD-10-CM

## 2016-08-14 LAB — CBC WITH DIFFERENTIAL/PLATELET
BASO%: 1 % (ref 0.0–2.0)
Basophils Absolute: 0.1 10*3/uL (ref 0.0–0.1)
EOS%: 9.6 % — AB (ref 0.0–7.0)
Eosinophils Absolute: 0.7 10*3/uL — ABNORMAL HIGH (ref 0.0–0.5)
HCT: 31.8 % — ABNORMAL LOW (ref 34.8–46.6)
HEMOGLOBIN: 10.4 g/dL — AB (ref 11.6–15.9)
LYMPH#: 1.9 10*3/uL (ref 0.9–3.3)
LYMPH%: 26 % (ref 14.0–49.7)
MCH: 29.9 pg (ref 25.1–34.0)
MCHC: 32.6 g/dL (ref 31.5–36.0)
MCV: 91.6 fL (ref 79.5–101.0)
MONO#: 0.8 10*3/uL (ref 0.1–0.9)
MONO%: 10.8 % (ref 0.0–14.0)
NEUT%: 52.6 % (ref 38.4–76.8)
NEUTROS ABS: 3.9 10*3/uL (ref 1.5–6.5)
PLATELETS: 254 10*3/uL (ref 145–400)
RBC: 3.48 10*6/uL — ABNORMAL LOW (ref 3.70–5.45)
RDW: 16.6 % — AB (ref 11.2–14.5)
WBC: 7.3 10*3/uL (ref 3.9–10.3)

## 2016-08-14 LAB — COMPREHENSIVE METABOLIC PANEL
ALT: 13 U/L (ref 0–55)
ANION GAP: 9 meq/L (ref 3–11)
AST: 22 U/L (ref 5–34)
Albumin: 3.2 g/dL — ABNORMAL LOW (ref 3.5–5.0)
Alkaline Phosphatase: 76 U/L (ref 40–150)
BUN: 56 mg/dL — AB (ref 7.0–26.0)
CHLORIDE: 105 meq/L (ref 98–109)
CO2: 24 mEq/L (ref 22–29)
CREATININE: 2.3 mg/dL — AB (ref 0.6–1.1)
Calcium: 9.4 mg/dL (ref 8.4–10.4)
EGFR: 22 mL/min/{1.73_m2} — ABNORMAL LOW (ref >90)
GLUCOSE: 208 mg/dL — AB (ref 70–140)
Potassium: 4.9 mEq/L (ref 3.5–5.1)
Sodium: 138 mEq/L (ref 136–145)
Total Bilirubin: 0.39 mg/dL (ref 0.20–1.20)
Total Protein: 7.4 g/dL (ref 6.4–8.3)

## 2016-08-14 MED ORDER — LIDOCAINE-PRILOCAINE 2.5-2.5 % EX CREA: 1.0000 "application " | TOPICAL_CREAM | CUTANEOUS | 0 refills | Status: DC | PRN

## 2016-08-14 MED ORDER — PROCHLORPERAZINE MALEATE 10 MG PO TABS: 10.0000 mg | ORAL_TABLET | Freq: Four times a day (QID) | ORAL | 0 refills | Status: DC | PRN

## 2016-08-14 NOTE — Telephone Encounter (Signed)
Pt scheduled for porta cath procedure in preparation for chemotherapy  .Can she hold plavix for 5 days prior to procedure?

## 2016-08-14 NOTE — Progress Notes (Signed)
Radiation Oncology         [phone] ________________________________  Multidisciplinary Thoracic Oncology Clinic [facility]) Initial Outpatient Consultation  Name: Erica Jimenez MRN: [id number]  Date: 08/14/2016  DOB: October 17, 1929  Jimenez, Erica (Inactive)  Erica Noel, MD   REFERRING PHYSICIAN: Rigoberto Noel, MD  DIAGNOSIS: The primary encounter diagnosis was Malignant neoplasm of right lung, unspecified part of lung ([place]). A diagnosis of Stage IV squamous cell carcinoma of right lung (HCC) was also pertinent to this visit.    ICD-9-CM ICD-10-CM   1. Malignant neoplasm of right lung, unspecified part of lung (HCC) 162.9 C34.91   2. Stage IV squamous cell carcinoma of right lung (HCC) 162.9 C34.91     HISTORY OF PRESENT ILLNESS::Erica Jimenez is a 83 y.o. female who is referred for evaluation of newly diagnosed stage IIIb, T4N2M0 NSCLC of the right lung.  She presented to her PCP in 06/2016 wth complaints of dry cough and two episodes of hemoptysis. CT Chest was performed on 07/08/16 revealing a 3.6 x 3.4 cm RUL mass surrounding and obstructing a RUL bronchus with evidence of endobronchial spread of tumor up into the right lung apex. Additionally, there was a similar appearing RLL mass measuring 3.7 x 3.5 cm surrounding and obstructing a RLL bronchus with evidence of endobronchial spread, an enlarging right apical lung mass with small satellite pulmonary lesions and a 3.8 x 2.5 cm mass in the superior segment of the RLL. There were scattered mediastinal and hilar lymph nodes with the largest measuring 17 x 17 mm in the precarinal region.  This node previously measured 13 x 8 mm on a CT Chest from 2012-08-13.        The patient underwent CT guided core needle biospy of the RLL lesion on 07/28/16 and pathology confirmed squamous cell carcinoma.  PET CT scan on 07/30/16 revealed positive nodes with RUL mass SUV of 6.8, R apex nodule SUV 6.9, RLL mass SUV 14.69 and postobstructive pneumonitis in  the right lung base.  The patient was referred today for presentation in the multidisciplinary thoracic conference.  Radiology studies and pathology slides were presented there for review and discussion of treatment options.  A consensus was discussed regarding potential next steps.  PREVIOUS RADIATION THERAPY: No  PAST MEDICAL HISTORY:  has a past medical history of A-V fistula ([place]); Alopecia; Anemia; Anxiety; Aortic stenosis; Arthritis; Bladder cancer ([place]); CAD (coronary artery disease); Cardiomyopathy, ischemic; CKD (chronic kidney disease); COPD (chronic obstructive pulmonary disease) ([place]); CVA (cerebral vascular accident) ([place]); DM (diabetes mellitus) ([place]); Full dentures; Gout; History of glaucoma; HOH (hard of hearing); HTN (hypertension); Hyperlipidemia; Hypothyroidism; Myocardial infarction; PAD (peripheral artery disease) ([place]); Shortness of breath dyspnea; and Wears glasses.    PAST SURGICAL HISTORY: Past Surgical History:  Procedure Laterality Date  . ABDOMINAL AORTAGRAM N/A 08/17/2013   Procedure: ABDOMINAL Maxcine Ham;  Surgeon: Wellington Hampshire, MD;  Location: [facility] CATH [facility];  Service: Cardiovascular;  Laterality: N/A;  . ANGIOPLASTY    . CYSTOSCOPY/RETROGRADE/URETEROSCOPY Bilateral 01/09/2015   Procedure: CYSTOSCOPY BILATERAL RETROGRADE PYELOGRAM, WASHINGS RENAL PELVIS & BLADDER ;  Surgeon: Lowella Bandy, MD;  Location: [facility];  Service: Urology;  Laterality: Bilateral;  . CYSTOSTOMY W/ BLADDER BIOPSY    . EYE SURGERY Bilateral    cataract w IOL    FAMILY HISTORY: family history includes Breast cancer in her mother; Heart disease in her maternal aunt; Tuberculosis in her father. Her husband recently died from lung cancer in Aug 14, 2014.  SOCIAL  HISTORY:  reports that she quit smoking about 6 weeks ago. Her smoking use included Cigarettes. She has a 21.00 pack-year smoking history. She has never used smokeless tobacco. She reports that she drinks about 0.6 oz of alcohol per  week . She reports that she does not use drugs.  ALLERGIES: Contrast media [iodinated diagnostic agents]; Fluoxetine; and Sulfonamide derivatives  MEDICATIONS:  Current Outpatient Prescriptions  Medication Sig Dispense Refill  . ALPRAZolam (XANAX) 0.25 MG tablet Take 0.25 mg by mouth daily as needed for anxiety.    Marland Kitchen amLODipine (NORVASC) 5 MG tablet take 1 tablet by mouth once daily 30 tablet 11  . aspirin 81 MG tablet Take 81 mg by mouth daily.      Marland Kitchen atorvastatin (LIPITOR) 80 MG tablet Take 80 mg by mouth daily.      . bisoprolol (ZEBETA) 5 MG tablet Take 0.5 tablets (2.5 mg total) by mouth daily. *Please keep upcoming appointment for further refills* 15 tablet 0  . brinzolamide (AZOPT) 1 % ophthalmic suspension Place 1 drop into both eyes daily.     . calcitRIOL (ROCALTROL) 0.25 MCG capsule Take 0.25 mcg by mouth 3 (three) times a week. Monday, Wednesday, Friday    . Carboxymethylcellulose Sodium (REFRESH TEARS OP) Apply 1 drop to eye 2 (two) times daily. Both eyes.    . citalopram (CELEXA) 20 MG tablet Take 20 mg by mouth at bedtime.    . clopidogrel (PLAVIX) 75 MG tablet Take 75 mg by mouth daily.      . febuxostat (ULORIC) 40 MG tablet Take 40 mg by mouth daily.    . Ferrous Gluconate (FERGON PO) Take 1 tablet by mouth daily.     Marland Kitchen FOLIC ACID PO Take 2.2 mg by mouth daily.     . furosemide (LASIX) 40 MG tablet Take 40 mg by mouth daily as needed.     Marland Kitchen HUMALOG KWIKPEN 100 UNIT/ML KiwkPen Inject 2-5 Units as directed 3 (three) times daily.  0  . hydrALAZINE (APRESOLINE) 50 MG tablet Take 1 tablet (50 mg total) by mouth 2 (two) times daily. 60 tablet 0  . isosorbide mononitrate (IMDUR) 60 MG 24 hr tablet Take 60 mg by mouth daily.      Marland Kitchen LANTUS SOLOSTAR 100 UNIT/ML Solostar Pen Inject 12 Units as directed daily.   0  . latanoprost (XALATAN) 0.005 % ophthalmic solution Place 1 drop into both eyes at bedtime.     Marland Kitchen levothyroxine (SYNTHROID, LEVOTHROID) 50 MCG tablet Take 50 mcg by mouth  daily before breakfast.     . lidocaine-prilocaine (EMLA) cream Apply 1 application topically as needed. 30 g 0  . mirtazapine (REMERON) 30 MG tablet Take 15 mg by mouth at bedtime.  0  . nitroGLYCERIN (NITROSTAT) 0.4 MG SL tablet Place 1 tablet (0.4 mg total) under the tongue every 5 (five) minutes as needed for chest pain. (Patient not taking: Reported on 08/14/2016) 25 tablet 3  . Phenylephrine-APAP-Guaifenesin (MUCINEX SINUS-MAX CONGESTION) 10-650-400 MG/20ML LIQD Per bottle as needed    . prochlorperazine (COMPAZINE) 10 MG tablet Take 1 tablet (10 mg total) by mouth every 6 (six) hours as needed for nausea or vomiting. 30 tablet 0  . senna (SENOKOT) 8.6 MG tablet Take 1 tablet by mouth as needed for constipation.     No current facility-administered medications for this encounter.     REVIEW OF SYSTEMS:  On review of systems, the patient reports that she is doing well overall. She denies any chest pain,  fevers, chills, night sweats, unintended weight changes. She has not had any further hemoptysis and denies shortness of breath.  She denies any bowel or bladder disturbances, and denies abdominal pain, nausea or vomiting. She denies any new musculoskeletal or joint aches or pains, new skin lesions or concerns. She denies swelling of her legs or ankles. A complete review of systems is obtained and is otherwise negative.   PHYSICAL EXAM:  Vitals with BMI 08/14/2016  Height 5' 4.5"  Weight 142 lbs 8 oz  BMI 50.3  Systolic 888  Diastolic 48  Pulse 58  Respirations 17   In general this is a well appearing African American female in no acute distress. She is alert and oriented x4 and appropriate throughout the examination. HEENT reveals that the patient is normocephalic, atraumatic. EOMs are intact. PERRLA. Skin is intact without any evidence of gross lesions. Cardiovascular exam reveals a regular rate and rhythm, no clicks rubs or murmurs are auscultated. Chest is clear to auscultation  bilaterally with decreased breath sounds in the right upper lobe. Lymphatic assessment is performed and does not reveal any adenopathy in the cervical, supraclavicular, axillary, or inguinal chains. Abdomen has active bowel sounds in all quadrants and is intact. The abdomen is soft, non tender, non distended. Lower extremities are negative for pretibial pitting edema, deep calf tenderness, cyanosis or clubbing.   KPS = 100  100 - Normal; no complaints; no evidence of disease. 90   - Able to carry on normal activity; minor signs or symptoms of disease. 80   - Normal activity with effort; some signs or symptoms of disease. 35   - Cares for self; unable to carry on normal activity or to do active work. 60   - Requires occasional assistance, but is able to care for most of his personal needs. 50   - Requires considerable assistance and frequent medical care. 61   - Disabled; requires special care and assistance. 6   - Severely disabled; hospital admission is indicated although death not imminent. 23   - Very sick; hospital admission necessary; active supportive treatment necessary. 10   - Moribund; fatal processes progressing rapidly. 0     - Dead  Karnofsky DA, Abelmann [place], Craver LS and [facility] [phone]) The use of the nitrogen mustards in the palliative treatment of carcinoma: with particular reference to bronchogenic carcinoma Cancer 1 634-56  LABORATORY DATA:  Lab Results  Component Value Date   WBC 7.3 08/14/2016   HGB 10.4 (L) 08/14/2016   HCT 31.8 (L) 08/14/2016   MCV 91.6 08/14/2016   PLT 254 08/14/2016   Lab Results  Component Value Date   NA 138 08/14/2016   K 4.9 08/14/2016   CL 116 (H) 01/09/2015   CO2 24 08/14/2016   Lab Results  Component Value Date   ALT 13 08/14/2016   AST 22 08/14/2016   ALKPHOS 76 08/14/2016   BILITOT 0.39 08/14/2016    PULMONARY FUNCTION TEST:   Recent Review Flowsheet Data    There is no flowsheet data to display.      RADIOGRAPHY:  Nm Pet Image Initial (pi) Skull Base To Thigh  Result Date: 07/30/2016 CLINICAL DATA:  Initial treatment strategy for lung mass. EXAM: NUCLEAR MEDICINE PET SKULL BASE TO THIGH TECHNIQUE: 6.8 mCi F-18 FDG was injected intravenously. Full-ring PET imaging was performed from the skull base to thigh after the radiotracer. CT data was obtained and used for attenuation correction and anatomic localization. FASTING BLOOD GLUCOSE:  Value: 162 mg/dl COMPARISON:  07/08/2016 FINDINGS: NECK No hypermetabolic lymph nodes in the neck. CHEST The heart size appears mildly enlarged. No pericardial effusion. Aortic atherosclerosis. Calcification in the RCA and LAD coronary artery noted. Right paratracheal lymph node measures 1.6 cm and has an SUV max equal to 5.95. Enlarged right hilar lymph node measures 1.6 cm and has an SUV max equal to 9.8, image 66 of series 4. Right upper lobe hypermetabolic mass measures 3.1 cm and has an SUV max equal to 6.8. There is evidence of endobronchial spread of tumor into the right upper lobe and right apex. Nodule in the right apex measures 3.3 cm and has an SUV max equal to 6.9. There is a hypermetabolic mass within the superior segment of the right lower lobe which measures 4.3 cm and has an SUV max equal to 14.69. There is postobstructive pneumonitis involving the right lung base. Small nodule within the left apex measures 5 mm and is too small to reliably characterize. Calcified granuloma noted in the right lung base. ABDOMEN/PELVIS No abnormal hypermetabolic activity within the liver, pancreas, adrenal glands, or spleen. No hypermetabolic lymph nodes in the abdomen or pelvis. SKELETON No focal hypermetabolic activity to suggest skeletal metastasis. IMPRESSION: 1. Multifocal hypermetabolic lesions are identified within the right upper lobe and right lower lobe. There is also hypermetabolic right hilar adenopathy and right paratracheal adenopathy. Assuming non-small cell histology imaging  findings would be compatible with T4N2M0 or stage IIIb disease. 2. Aortic atherosclerosis. Multi vessel coronary artery calcifications noted. Electronically Signed   By: Kerby Moors M.D.   On: 07/30/2016 14:47   Ct Biopsy  Result Date: 07/28/2016 INDICATION: Right lower lobe lung mass EXAM: CT BIOPSY MEDICATIONS: None. ANESTHESIA/SEDATION: Fentanyl 50 mcg IV; Versed 1.5 mg IV Moderate Sedation Time:  11 The patient was continuously monitored during the procedure by the interventional radiology nurse under my direct supervision. FLUOROSCOPY TIME:  None. COMPLICATIONS: None immediate. PROCEDURE: Informed written consent was obtained from the patient after a thorough discussion of the procedural risks, benefits and alternatives. All questions were addressed. Maximal Sterile Barrier Technique was utilized including caps, mask, sterile gowns, sterile gloves, sterile drape, hand hygiene and skin antiseptic. A timeout was performed prior to the initiation of the procedure. Under CT guidance, a(n) 17 gauge guide needle was advanced into the right lower lobe lung mass. Subsequently 3 18 gauge core biopsies were obtained. The guide needle was removed. Post biopsy images demonstrate no pneumothorax. Patient tolerated the procedure well without complication. Vital sign monitoring by nursing staff during the procedure will continue as patient is in the special procedures unit for post procedure observation. FINDINGS: The images document guide needle placement within the right lower lobe lung mass. Post biopsy images demonstrate no pneumothorax. IMPRESSION: Successful CT-guided right lower lobe lung mass biopsy. Electronically Signed   By: Marybelle Killings M.D.   On: 07/28/2016 14:04   Dg Chest Port 1 View  Result Date: 07/28/2016 CLINICAL DATA:  Biopsy EXAM: PORTABLE CHEST 1 VIEW COMPARISON:  07/28/2016 FINDINGS: There is no pneumothorax after right lower lobe lung biopsy. Cardiomegaly persists. Right apical lung mass  persists. Central right basilar lung mass and associated right lower lobe opacity is stable. Left lung is relatively clear. IMPRESSION: No pneumothorax post right lung biopsy. Electronically Signed   By: Marybelle Killings M.D.   On: 07/28/2016 13:57   IMPRESSION/PLAN:  1. 83 y.o. woman with Stage IIIb, T4N2M0 NSCLC of the right lung. Today, we talked to the  patient and daughter about the findings and work-up thus far.  We discussed the natural history of non-small cell lung cancer and general treatment, highlighting the role of radiotherapy in the management.  The consensus from conference is to proceed with systemic chemotherapy initially and then repeat imaging at completion of therapy.  Consideration will be given at that time, based on scan results, for potential radiation to any residual disease.  We discussed the available radiation techniques, and focused on the details of logistics and delivery.  We reviewed the anticipated acute and late sequelae associated with radiation in this setting.  The patient was encouraged to ask questions that we answered to the best of our ability. The patient is in agreement with this plan and understands that we will remain in contact with Dr. [name] and follow her progress via close correspondence.   I spent 60 minutes minutes face to face with the patient and more than 50% of that time was spent in counseling and/or coordination of care.   Nicholos Johns, PA-C    Tyler Pita, MD  [facility] Direct Dial: [phone]  Fax: [phone] [email or web address]  Skype  [email or web address]  This document serves as a record of services personally performed by Tyler Pita, MD and Freeman Caldron, PA-C. It was created on their behalf by Arlyce Harman, a trained medical scribe. The creation of this record is based on the scribe's personal observations and the provider's statements to them. This document has been checked and approved by the attending  provider.

## 2016-08-14 NOTE — Therapy (Signed)
[place], [place], [address] Phone: [phone]   Fax:  [phone]  Physical Therapy Evaluation  Patient Details  Name: Erica Jimenez MRN: [id number] Date of Birth: July 24, 1929 Referring Provider: Dr. Curt Bears  Encounter Date: 08/14/2016      PT End of Session - 08/14/16 1526    Visit Number 1   Number of Visits 1   PT Start Time 6222   PT Stop Time 1508   PT Time Calculation (min) 23 min   Activity Tolerance Patient tolerated treatment well   Behavior During Therapy [facility] for tasks assessed/performed      Past Medical History:  Diagnosis Date  . A-V fistula ([place])    pt has 2  fistulas in rt arm  . Alopecia   . Anemia   . Anxiety   . Aortic stenosis   . Arthritis    gout  . Bladder cancer [facility])     s/p resection and BCG treatment.   Marland Kitchen CAD (coronary artery disease)     Pt is s/p anterior MI in 1996 with stent placed in LAD.  Last myoview in our office was in 7/05 and  showed apical anterior, apical, and apical inferior infarct.  Minimal peri-infarct ischemia.  EF 49%.   . Cardiomyopathy, ischemic   . CKD (chronic kidney disease)     Pt is stage III-IV.  She had a fistula placed but is not on dialysis.  She follows with Dr. Lorrene Reid for  nephrology.   Marland Kitchen COPD (chronic obstructive pulmonary disease) ([place])   . CVA (cerebral vascular accident) ([place])   . DM (diabetes mellitus) ([place])   . Full dentures   . Gout   . History of glaucoma   . HOH (hard of hearing)    slightly  . HTN (hypertension)   . Hyperlipidemia   . Hypothyroidism   . Myocardial infarction   . PAD (peripheral artery disease) ([facility])     arterial doppler study 12/04 suggestive of > 50% bilateral SFA stenosis.  Pt has mild claudication.  No invasive evaluation given CKD and desire to avoid contrast use.   Marland Kitchen Shortness of breath dyspnea    on exertion  . Wears glasses     Past Surgical History:  Procedure Laterality Date   . ABDOMINAL AORTAGRAM N/A 08/17/2013   Procedure: ABDOMINAL Maxcine Ham;  Surgeon: Wellington Hampshire, MD;  Location: [place] CATH LAB;  Service: Cardiovascular;  Laterality: N/A;  . ANGIOPLASTY    . CYSTOSCOPY/RETROGRADE/URETEROSCOPY Bilateral 01/09/2015   Procedure: CYSTOSCOPY BILATERAL RETROGRADE PYELOGRAM, WASHINGS RENAL PELVIS & BLADDER ;  Surgeon: Lowella Bandy, MD;  Location: [facility];  Service: Urology;  Laterality: Bilateral;  . CYSTOSTOMY W/ BLADDER BIOPSY    . EYE SURGERY Bilateral    cataract w IOL    There were no vitals filed for this visit.       Subjective Assessment - 08/14/16 1513    Subjective Says she gets leg cramps if she walks some distance.   Patient is accompained by: Family member  daughter   Pertinent History Pt. presented with cough and hemoptysis; work-up showed right lung mass.  Scans and biopsy indicate right lower lobe squamous cell carcinoma with hypermetabolic nodes on PET; pt. is stage IIIB or stage IV.  She is expected to have chemotherapy, then possibly chemoradiation.  Ex-smoker who quit 06/2016 after 21 pack-years; bladder cancer s/p resection 2012; ESRD; CAD/MI+; COPD; arthritis, CVA, DM; HOH, HTN, SOB.  Note that patient's husband died a year or so ago from lung cancer.   Patient Stated Goals get info from all lung clinic providers   Currently in Pain? No/denies            [facility] PT Assessment - 08/14/16 0001      Assessment   Medical Diagnosis right lower lobe squamous cell carcinoma, stage III or IV   Referring Provider Dr. Curt Bears     Precautions   Precautions Other (comment)   Precaution Comments cancer precautions     Restrictions   Weight Bearing Restrictions No     Balance Screen   Has the patient fallen in the past 6 months No   Has the patient had a decrease in activity level because of a fear of falling?  No   Is the patient reluctant to leave their home because of a fear of falling?  No     Home Environment    Living Environment Private residence   Living Arrangements Children   Type of [place] One level  large, per patient's daughter     Prior Function   Level of Independence Independent with basic ADLs;Independent with community mobility without device   Leisure walks in the house daily for functional activities, but not expressly for exercise     Cognition   Overall Cognitive Status Within Functional Limits for tasks assessed     Functional Tests   Functional tests Sit to Stand     Sit to Stand   Comments 2x in 30 seconds; seemed not to have the strength to do more (not limited by dyspnea)  well below average for age     Posture/Postural Control   Posture/Postural Control No significant limitations     ROM / Strength   AROM / PROM / Strength AROM     AROM   Overall AROM Comments trunk AROM in standing:  flexion--reaches fingertips approx. 6 inches to floor; extension 25% loss; sidebend WFL bilat.; rotation 10% limited bilat.  denies pain with these movements     Ambulation/Gait   Ambulation/Gait Yes   Ambulation/Gait Assistance 6: Modified independent (Device/Increase time)  needs extra time on stairs, per her report   Assistive device None     Balance   Balance Assessed Yes     Dynamic Standing Balance   Dynamic Standing - Comments reaches forward about 12 inches in standing, WNL for age                           PT Education - 08/14/16 1525    Education provided Yes   Education Details energy conservation, walking, Cure article on staying active, posture, breathing, PT info   Person(s) Educated Patient;Child(ren)   Methods Explanation;Handout   Comprehension Verbalized understanding               Lung Clinic Goals - 08/14/16 1532      Patient will be able to verbalize understanding of the benefit of exercise to decrease fatigue.   Status Achieved     Patient will be able to verbalize the importance of posture.    Status Achieved     Patient will be able to demonstrate diaphragmatic breathing for improved lung function.   Status Achieved     Patient will be able to verbalize understanding of the role of physical therapy to prevent functional decline and who to contact if physical therapy is  needed.   Status Achieved             Plan - 08/14/16 1526    Clinical Impression Statement This is a very pleasant woman who was attentive throughout the PT eval today.  She has good posture, mildly limited trunk AROM, good forward reach in standing, and could do just two repetitions in sit to stand over 30 seconds, apparently due to generalized weakness.  Eval is moderate complexity today due to many comorbidities (cardiac, pulmonary, h/o cancer, and others); evolving as she is just being diagnosed with lung cancer and will begin treatment for that soon.   Rehab Potential Good   PT Frequency One time visit   PT Treatment/Interventions Patient/family education   PT Next Visit Plan None at this time; patient may benefit from strengthening and mobility training if limited after treatment.   PT Home Exercise Plan walking for exercise, breathing exercise   Consulted and Agree with Plan of Care Patient      Patient will benefit from skilled therapeutic intervention in order to improve the following deficits and impairments:  Decreased strength, Decreased mobility  Visit Diagnosis: Muscle weakness (generalized) - Plan: PT plan of care cert/re-cert  Difficulty in walking, not elsewhere classified - Plan: PT plan of care cert/re-cert      G-Codes - 16/10/96 1532    Functional Assessment Tool Used (Outpatient Only) clinical judgement   Functional Limitation Mobility: Walking and moving around   Mobility: Walking and Moving Around Current Status (E4540) At least 1 percent but less than 20 percent impaired, limited or restricted   Mobility: Walking and Moving Around Goal Status [phone]) At least 1 percent but  less than 20 percent impaired, limited or restricted   Mobility: Walking and Moving Around Discharge Status [phone]) At least 1 percent but less than 20 percent impaired, limited or restricted       Problem List Patient Active Problem List   Diagnosis Date Noted  . Stage IV squamous cell carcinoma of right lung ([place]) 08/14/2016  . Lung cancer ([place]) 08/12/2016  . Right lower lobe lung mass 07/21/2016  . Pulmonary infiltrates 05/20/2012  . Smoking 08/30/2010  . Aortic valve disorder 02/12/2009  . CARDIOMYOPATHY, ISCHEMIC 08/14/2008  . Hyperlipidemia 07/11/2008  . Essential hypertension 07/11/2008  . CAD, NATIVE VESSEL 07/11/2008  . BRADYCARDIA 07/11/2008  . Peripheral vascular disease ([place]) 07/11/2008  . CARDIAC MURMUR 07/11/2008  . CAROTID BRUIT 07/11/2008    Sha Amer 08/14/2016, 3:36 PM  [place], [place], [address] Phone: [phone]   Fax:  [phone]  Name: Erica Jimenez MRN: [id number] Date of Birth: March 12, 1930  Serafina Royals, PT 08/14/16 3:36 PM

## 2016-08-14 NOTE — Progress Notes (Signed)
START ON PATHWAY REGIMEN - Non-Small Cell Lung     A cycle is every 21 days:     Paclitaxel      Carboplatin   **Always confirm dose/schedule in your pharmacy ordering system**    Patient Characteristics: Stage IV Metastatic, Squamous, PS = 0, 1, First Line, PD-L1 Expression Positive 1-49% (TPS) / Negative / Not Tested / Not a Candidate for Immunotherapy AJCC T Category: T4 Current Disease Status: Distant Metastases AJCC N Category: N2 AJCC M Category: M1a AJCC 8 Stage Grouping: IVA Histology: Squamous Cell Line of therapy: First Line PD-L1 Expression Status: PD-L1 Positive 1-49% (TPS) Performance Status: PS = 0, 1 Would you be surprised if this patient died  in the next year? I would NOT be surprised if this patient died in the next year  Intent of Therapy: Non-Curative / Palliative Intent, Discussed with Patient

## 2016-08-14 NOTE — Telephone Encounter (Signed)
Gave patient AVS and calender per 4/12 los - to schedule additional appts for f/u - conflict with MM availability will let patient know when scheduled.

## 2016-08-14 NOTE — Progress Notes (Signed)
Oncology Nurse Navigator Documentation  Oncology Nurse Navigator Flowsheets 08/14/2016  Navigator Location [facility]  Navigator Encounter Type Clinic/MDC/I spoke with patient and her daughter today at [facility].  I gave and explained information on lung cancer, resources and support services at [facility], and next steps.    Abnormal Finding Date 07/08/2016  Confirmed Diagnosis Date 07/28/2016  Multidisiplinary Clinic Date 08/14/2016  Patient Visit Type MedOnc  Barriers/Navigation Needs Education  Education Understanding Cancer/ Treatment Options;Newly Diagnosed Cancer Education  Interventions Education  Education Method Verbal;Written  Support Groups/Services [employer];Other  Acuity Level 2  Time Spent with Patient 30

## 2016-08-14 NOTE — Progress Notes (Signed)
[place] [phone]   [phone] Multidisciplinary thoracic oncology clinic  CONSULT NOTE  REFERRING PHYSICIAN: Dr. Kara Mead  REASON FOR CONSULTATION:  83 years old white female recently diagnosed with lung cancer.  HPI Erica Jimenez is a 84 y.o. female with past medical history significant for multiple medical problems including aortic stenosis, anxiety, and anemia, gout, history of bladder cancer status post resection followed by BCG, coronary heart disease status post stent placement, chronic kidney disease, COPD, diabetes mellitus, hypertension, dyslipidemia and TIA. The patient also has a long history of smoking but quit in February 2018. She mentions that she has been complaining of increasing weight loss as well as cough productive of blood tinged sputum. She was treated for depression after the death of her husband a year ago. The patient was seen by her primary care physician and chest x-ray was performed and it showed right upper lobe lung opacity. This was followed by CT scan of the chest on 07/08/2016 and it showed large central right upper lobe lung mass surrounding and obstructing the right upper lobe bronchus and it measured 3.6 x 3.4 cm was evidence of endobronchial spread of tumor into the right lung apex. There was enlarging right apical lung mass and a small satellite pulmonary lesions. There is also similar right lower lobe lung mass surrounding and obstructing right lower lobe bronchi with evidence of endobronchial spread of tumor. This lesion measured 3.7 x 3.5 cm. There was a second mass in the superior segment of the right lower lobe which measured 3.8 x 2.5 cm. This is likely secondary to endobronchial spread of tumor from the right hilar mass. There was also small left upper lobe pulmonary nodule. This can also scattered mediastinal and hilar lymph nodes. The largest node is in the precarinal region and measured 1.7 x 1.7  cm. CT-guided core biopsy of the right lower lobe lung mass was performed on 07/28/2016 and the final pathology ([id number]) showed squamous cell carcinoma. PDL 1 impression was 15%.  A PET scan on 07/30/2016 showed multifocal hypermetabolic lesions identified within the right upper lobe and right lower lobe. There was also hypermetabolic right hilar adenopathy and right paratracheal adenopathy. This was consistent with T4, N2, M0, stage IIIB lung cancer. Dr. Elsworth Soho kindly referred the patient to me today for evaluation and recommendation regarding treatment of her condition. When seen today she is complaining of increasing fatigue and weakness. She also has shortness breath with exertion and dry cough. She denied having any hemoptysis. She has mild headache. She denied having any chest pain or hemoptysis. She denied having any visual changes. She has no significant weight loss or night sweats. Family history significant for father died from complications of tuberculosis, mother died from breast cancer in her 2s.  The patient is a widow and has one daughter. She was accompanied by her daughter Erica Jimenez today. She used to do clerical work for [employer] She has a history of smoking 1 pack per day for around 63 years and quit 14 June 2016. She also used to drink alcohol at regular basis but not in the last 3 months. She has no history of drug abuse.  HPI  Past Medical History:  Diagnosis Date  . A-V fistula ([place])    pt has 2  fistulas in rt arm  . Alopecia   . Anemia   . Anxiety   . Aortic stenosis   . Arthritis    gout  .  Bladder cancer [facility])     s/p resection and BCG treatment.   Marland Kitchen CAD (coronary artery disease)     Pt is s/p anterior MI in 1996 with stent placed in LAD.  Last myoview in our office was in 7/05 and  showed apical anterior, apical, and apical inferior infarct.  Minimal peri-infarct ischemia.  EF 49%.   . Cardiomyopathy, ischemic   . CKD (chronic kidney disease)     Pt is  stage III-IV.  She had a fistula placed but is not on dialysis.  She follows with Dr. Lorrene Reid for  nephrology.   Marland Kitchen COPD (chronic obstructive pulmonary disease) ([place])   . CVA (cerebral vascular accident) ([place])   . DM (diabetes mellitus) ([place])   . Full dentures   . Gout   . History of glaucoma   . HOH (hard of hearing)    slightly  . HTN (hypertension)   . Hyperlipidemia   . Hypothyroidism   . Myocardial infarction   . PAD (peripheral artery disease) ([facility])     arterial doppler study 12/04 suggestive of > 50% bilateral SFA stenosis.  Pt has mild claudication.  No invasive evaluation given CKD and desire to avoid contrast use.   Marland Kitchen Shortness of breath dyspnea    on exertion  . Wears glasses     Past Surgical History:  Procedure Laterality Date  . ABDOMINAL AORTAGRAM N/A 08/17/2013   Procedure: ABDOMINAL Maxcine Ham;  Surgeon: Wellington Hampshire, MD;  Location: [facility] CATH [facility];  Service: Cardiovascular;  Laterality: N/A;  . ANGIOPLASTY    . CYSTOSCOPY/RETROGRADE/URETEROSCOPY Bilateral 01/09/2015   Procedure: CYSTOSCOPY BILATERAL RETROGRADE PYELOGRAM, WASHINGS RENAL PELVIS & BLADDER ;  Surgeon: Lowella Bandy, MD;  Location: [facility];  Service: Urology;  Laterality: Bilateral;  . CYSTOSTOMY W/ BLADDER BIOPSY    . EYE SURGERY Bilateral    cataract w IOL    Family History  Problem Relation Age of Onset  . Breast cancer Mother   . Tuberculosis Father   . Heart disease Maternal Aunt     Social History Social History  Substance Use Topics  . Smoking status: Former Smoker    Packs/day: 0.35    Years: 60.00    Types: Cigarettes    Quit date: 06/30/2016  . Smokeless tobacco: Never Used  . Alcohol use 0.6 oz/week    1 Shots of liquor per week    Allergies  Allergen Reactions  . Contrast Media [Iodinated Diagnostic Agents] Other (See Comments)    Renal insufficiency  . Fluoxetine Rash  . Sulfonamide Derivatives Nausea Only    Current Outpatient Prescriptions  Medication  Sig Dispense Refill  . amLODipine (NORVASC) 5 MG tablet take 1 tablet by mouth once daily 30 tablet 11  . aspirin 81 MG tablet Take 81 mg by mouth daily.      Marland Kitchen atorvastatin (LIPITOR) 80 MG tablet Take 80 mg by mouth daily.      . bisoprolol (ZEBETA) 5 MG tablet Take 0.5 tablets (2.5 mg total) by mouth daily. *Please keep upcoming appointment for further refills* 15 tablet 0  . brinzolamide (AZOPT) 1 % ophthalmic suspension Place 1 drop into both eyes daily.     . calcitRIOL (ROCALTROL) 0.25 MCG capsule Take 0.25 mcg by mouth 3 (three) times a week. Monday, Wednesday, Friday    . Carboxymethylcellulose Sodium (REFRESH TEARS OP) Apply 1 drop to eye 2 (two) times daily. Both eyes.    . citalopram (CELEXA) 20 MG tablet Take 20  mg by mouth at bedtime.    . clopidogrel (PLAVIX) 75 MG tablet Take 75 mg by mouth daily.      . febuxostat (ULORIC) 40 MG tablet Take 40 mg by mouth daily.    . Ferrous Gluconate (FERGON PO) Take 1 tablet by mouth daily.     Marland Kitchen FOLIC ACID PO Take 2.2 mg by mouth daily.     . furosemide (LASIX) 40 MG tablet Take 40 mg by mouth daily as needed.     Marland Kitchen HUMALOG KWIKPEN 100 UNIT/ML KiwkPen Inject 2-5 Units as directed 3 (three) times daily.  0  . hydrALAZINE (APRESOLINE) 50 MG tablet Take 1 tablet (50 mg total) by mouth 2 (two) times daily. 60 tablet 0  . isosorbide mononitrate (IMDUR) 60 MG 24 hr tablet Take 60 mg by mouth daily.      Marland Kitchen LANTUS SOLOSTAR 100 UNIT/ML Solostar Pen Inject 12 Units as directed daily.   0  . latanoprost (XALATAN) 0.005 % ophthalmic solution Place 1 drop into both eyes at bedtime.     Marland Kitchen levothyroxine (SYNTHROID, LEVOTHROID) 50 MCG tablet Take 50 mcg by mouth daily before breakfast.     . mirtazapine (REMERON) 30 MG tablet Take 15 mg by mouth at bedtime.  0  . Phenylephrine-APAP-Guaifenesin (MUCINEX SINUS-MAX CONGESTION) 10-650-400 MG/20ML LIQD Per bottle as needed    . senna (SENOKOT) 8.6 MG tablet Take 1 tablet by mouth as needed for constipation.    Marland Kitchen  ALPRAZolam (XANAX) 0.25 MG tablet Take 0.25 mg by mouth daily as needed for anxiety.    . nitroGLYCERIN (NITROSTAT) 0.4 MG SL tablet Place 1 tablet (0.4 mg total) under the tongue every 5 (five) minutes as needed for chest pain. (Patient not taking: Reported on 08/14/2016) 25 tablet 3   No current facility-administered medications for this visit.     Review of Systems  Constitutional: positive for fatigue Eyes: negative Ears, nose, mouth, throat, and face: negative Respiratory: positive for cough and dyspnea on exertion Cardiovascular: negative Gastrointestinal: negative Genitourinary:negative Integument/breast: negative Hematologic/lymphatic: negative Musculoskeletal:negative Neurological: negative Behavioral/Psych: negative Endocrine: negative Allergic/Immunologic: negative  Physical Exam  PYK:DXIPJ, healthy, no distress, well nourished and well developed SKIN: skin color, texture, turgor are normal, no rashes or significant lesions HEAD: Normocephalic, No masses, lesions, tenderness or abnormalities EYES: normal, PERRLA EARS: External ears normal, Canals clear OROPHARYNX:no exudate, no erythema and lips, buccal mucosa, and tongue normal  NECK: supple, no adenopathy, no JVD LYMPH:  no palpable lymphadenopathy, no hepatosplenomegaly BREAST:not examined LUNGS: expiratory wheezes bilaterally, scattered rales bilaterally HEART: regular rate & rhythm, no murmurs and no gallops ABDOMEN:abdomen soft, non-tender, normal bowel sounds and no masses or organomegaly BACK: Back symmetric, no curvature., No CVA tenderness EXTREMITIES:no joint deformities, effusion, or inflammation, no edema  NEURO: alert & oriented x 3 with fluent speech, no focal motor/sensory deficits  PERFORMANCE STATUS: ECOG 1  LABORATORY DATA: Lab Results  Component Value Date   WBC 7.3 08/14/2016   HGB 10.4 (L) 08/14/2016   HCT 31.8 (L) 08/14/2016   MCV 91.6 08/14/2016   PLT 254 08/14/2016      Chemistry       Component Value Date/Time   NA 138 08/14/2016 1302   K 4.9 08/14/2016 1302   CL 116 (H) 01/09/2015 1123   CO2 24 08/14/2016 1302   BUN 56.0 (H) 08/14/2016 1302   CREATININE 2.3 (H) 08/14/2016 1302      Component Value Date/Time   CALCIUM 9.4 08/14/2016 1302   ALKPHOS  76 08/14/2016 1302   AST 22 08/14/2016 1302   ALT 13 08/14/2016 1302   BILITOT 0.39 08/14/2016 1302       RADIOGRAPHIC STUDIES: Nm Pet Image Initial (pi) Skull Base To Thigh  Result Date: 07/30/2016 CLINICAL DATA:  Initial treatment strategy for lung mass. EXAM: NUCLEAR MEDICINE PET SKULL BASE TO THIGH TECHNIQUE: 6.8 mCi F-18 FDG was injected intravenously. Full-ring PET imaging was performed from the skull base to thigh after the radiotracer. CT data was obtained and used for attenuation correction and anatomic localization. FASTING BLOOD GLUCOSE:  Value: 162 mg/dl COMPARISON:  07/08/2016 FINDINGS: NECK No hypermetabolic lymph nodes in the neck. CHEST The heart size appears mildly enlarged. No pericardial effusion. Aortic atherosclerosis. Calcification in the RCA and LAD coronary artery noted. Right paratracheal lymph node measures 1.6 cm and has an SUV max equal to 5.95. Enlarged right hilar lymph node measures 1.6 cm and has an SUV max equal to 9.8, image 66 of series 4. Right upper lobe hypermetabolic mass measures 3.1 cm and has an SUV max equal to 6.8. There is evidence of endobronchial spread of tumor into the right upper lobe and right apex. Nodule in the right apex measures 3.3 cm and has an SUV max equal to 6.9. There is a hypermetabolic mass within the superior segment of the right lower lobe which measures 4.3 cm and has an SUV max equal to 14.69. There is postobstructive pneumonitis involving the right lung base. Small nodule within the left apex measures 5 mm and is too small to reliably characterize. Calcified granuloma noted in the right lung base. ABDOMEN/PELVIS No abnormal hypermetabolic activity within the  liver, pancreas, adrenal glands, or spleen. No hypermetabolic lymph nodes in the abdomen or pelvis. SKELETON No focal hypermetabolic activity to suggest skeletal metastasis. IMPRESSION: 1. Multifocal hypermetabolic lesions are identified within the right upper lobe and right lower lobe. There is also hypermetabolic right hilar adenopathy and right paratracheal adenopathy. Assuming non-small cell histology imaging findings would be compatible with T4N2M0 or stage IIIb disease. 2. Aortic atherosclerosis. Multi vessel coronary artery calcifications noted. Electronically Signed   By: Kerby Moors M.D.   On: 07/30/2016 14:47   Ct Biopsy  Result Date: 07/28/2016 INDICATION: Right lower lobe lung mass EXAM: CT BIOPSY MEDICATIONS: None. ANESTHESIA/SEDATION: Fentanyl 50 mcg IV; Versed 1.5 mg IV Moderate Sedation Time:  11 The patient was continuously monitored during the procedure by the interventional radiology nurse under my direct supervision. FLUOROSCOPY TIME:  None. COMPLICATIONS: None immediate. PROCEDURE: Informed written consent was obtained from the patient after a thorough discussion of the procedural risks, benefits and alternatives. All questions were addressed. Maximal Sterile Barrier Technique was utilized including caps, mask, sterile gowns, sterile gloves, sterile drape, hand hygiene and skin antiseptic. A timeout was performed prior to the initiation of the procedure. Under CT guidance, a(n) 17 gauge guide needle was advanced into the right lower lobe lung mass. Subsequently 3 18 gauge core biopsies were obtained. The guide needle was removed. Post biopsy images demonstrate no pneumothorax. Patient tolerated the procedure well without complication. Vital sign monitoring by nursing staff during the procedure will continue as patient is in the special procedures unit for post procedure observation. FINDINGS: The images document guide needle placement within the right lower lobe lung mass. Post biopsy  images demonstrate no pneumothorax. IMPRESSION: Successful CT-guided right lower lobe lung mass biopsy. Electronically Signed   By: Marybelle Killings M.D.   On: 07/28/2016 14:04   Dg Chest [facility]  1 View  Result Date: 07/28/2016 CLINICAL DATA:  Biopsy EXAM: PORTABLE CHEST 1 VIEW COMPARISON:  07/28/2016 FINDINGS: There is no pneumothorax after right lower lobe lung biopsy. Cardiomegaly persists. Right apical lung mass persists. Central right basilar lung mass and associated right lower lobe opacity is stable. Left lung is relatively clear. IMPRESSION: No pneumothorax post right lung biopsy. Electronically Signed   By: Marybelle Killings M.D.   On: 07/28/2016 13:57    ASSESSMENT: This is a very pleasant 83 years old African-American female recently diagnosed with stage IIIB/IV non-small cell lung cancer, squamous cell carcinoma with PDL 1 expression of 15% diagnosed in March 2018 and presented with multiple masses in the right upper and lower lobes in addition to mediastinal lymphadenopathy and questionable nodule in the left upper lobe.Marland Kitchen   PLAN: I had a lengthy discussion with the patient and her daughter about her current disease stage, prognosis and treatment options. I recommended for the patient to complete the staging workup by ordering a MRI of the brain to rule out brain metastasis. I also explained to the patient and her daughter that she has incurable condition and on the treatment will be of palliative nature. I discussed with her the goals of care. I gave her the option of palliative care versus consideration of palliative systemic chemotherapy with carboplatin for AUC of 5 and paclitaxel 175 MG/M2 every 3 weeks with Neulasta support. The patient is interested in consideration of systemic treatment. I discussed with her the adverse effect of this treatment including but not limited to alopecia, myelosuppression, nausea and vomiting, peripheral neuropathy, liver or renal dysfunction. I will arrange for  the patient to have a chemotherapy education class before starting the first dose of her treatment. I will also refer the patient to interventional radiology for consideration of Port-A-Cath placement. I will call her pharmacy with prescription for Compazine 10 mg by mouth every 6 hours as needed for nausea as well as Emla cream to be applied to the Port-A-Cath site before treatment. The patient was seen during the multidisciplinary thoracic oncology clinic today by medical oncology, radiation oncology, thoracic navigator as well as physical therapist. She is expected to start the first dose of her treatment next week. I will see her back for follow-up visit in 2 weeks for evaluation and management of any adverse effect of her treatment. She was advised to call immediately if she has any concerning symptoms in the interval. The patient voices understanding of current disease status and treatment options and is in agreement with the current care plan. All questions were answered. The patient knows to call the clinic with any problems, questions or concerns. We can certainly see the patient much sooner if necessary. Thank you so much for allowing me to participate in the care of Erica Jimenez. I will continue to follow up the patient with you and assist in her care.  I spent 55 minutes counseling the patient face to face. The total time spent in the appointment was 80 minutes.  Disclaimer: This note was dictated with voice recognition software. Similar sounding words can inadvertently be transcribed and may not be corrected upon review.   Kaige Whistler K. August 14, 2016, 2:11 PM

## 2016-08-15 ENCOUNTER — Telehealth: Payer: Self-pay | Admitting: Internal Medicine

## 2016-08-15 NOTE — Progress Notes (Signed)
Reviewed & agree with plan  

## 2016-08-15 NOTE — Telephone Encounter (Signed)
Scheduled appts per 4/12 los. Patient aware of new appts and will pick up new schedule when they come in for chemo edu.

## 2016-08-16 ENCOUNTER — Inpatient Hospital Stay (HOSPITAL_COMMUNITY)
Admission: EM | Admit: 2016-08-16 | Discharge: 2016-08-18 | DRG: 065 | Disposition: A | Discharge status: Home / Self Care | Payer: Medicare Other | Attending: Nephrology | Admitting: Nephrology

## 2016-08-16 ENCOUNTER — Encounter (HOSPITAL_COMMUNITY): Payer: Self-pay | Admitting: Nurse Practitioner

## 2016-08-16 ENCOUNTER — Other Ambulatory Visit: Payer: Self-pay | Admitting: Internal Medicine

## 2016-08-16 ENCOUNTER — Other Ambulatory Visit: Payer: Self-pay

## 2016-08-16 ENCOUNTER — Ambulatory Visit (HOSPITAL_COMMUNITY)
Admission: RE | Admit: 2016-08-16 | Discharge: 2016-08-16 | Disposition: A | Discharge status: Home / Self Care | Payer: Medicare Other | Source: Ambulatory Visit | Attending: Internal Medicine | Admitting: Internal Medicine

## 2016-08-16 DIAGNOSIS — I350 Nonrheumatic aortic (valve) stenosis: Secondary | ICD-10-CM | POA: Diagnosis present

## 2016-08-16 DIAGNOSIS — Z794 Long term (current) use of insulin: Secondary | ICD-10-CM | POA: Diagnosis not present

## 2016-08-16 DIAGNOSIS — I639 Cerebral infarction, unspecified: Secondary | ICD-10-CM | POA: Diagnosis present

## 2016-08-16 DIAGNOSIS — D631 Anemia in chronic kidney disease: Secondary | ICD-10-CM | POA: Diagnosis not present

## 2016-08-16 DIAGNOSIS — Z7982 Long term (current) use of aspirin: Secondary | ICD-10-CM | POA: Diagnosis not present

## 2016-08-16 DIAGNOSIS — J449 Chronic obstructive pulmonary disease, unspecified: Secondary | ICD-10-CM | POA: Diagnosis present

## 2016-08-16 DIAGNOSIS — Z87891 Personal history of nicotine dependence: Secondary | ICD-10-CM | POA: Diagnosis not present

## 2016-08-16 DIAGNOSIS — I454 Nonspecific intraventricular block: Secondary | ICD-10-CM | POA: Diagnosis not present

## 2016-08-16 DIAGNOSIS — Z7902 Long term (current) use of antithrombotics/antiplatelets: Secondary | ICD-10-CM

## 2016-08-16 DIAGNOSIS — Z955 Presence of coronary angioplasty implant and graft: Secondary | ICD-10-CM

## 2016-08-16 DIAGNOSIS — Z8673 Personal history of transient ischemic attack (TIA), and cerebral infarction without residual deficits: Secondary | ICD-10-CM

## 2016-08-16 DIAGNOSIS — D6869 Other thrombophilia: Secondary | ICD-10-CM | POA: Diagnosis present

## 2016-08-16 DIAGNOSIS — G459 Transient cerebral ischemic attack, unspecified: Secondary | ICD-10-CM

## 2016-08-16 DIAGNOSIS — C3491 Malignant neoplasm of unspecified part of right bronchus or lung: Secondary | ICD-10-CM

## 2016-08-16 DIAGNOSIS — Z8551 Personal history of malignant neoplasm of bladder: Secondary | ICD-10-CM | POA: Diagnosis not present

## 2016-08-16 DIAGNOSIS — E1122 Type 2 diabetes mellitus with diabetic chronic kidney disease: Secondary | ICD-10-CM | POA: Diagnosis not present

## 2016-08-16 DIAGNOSIS — Z882 Allergy status to sulfonamides status: Secondary | ICD-10-CM

## 2016-08-16 DIAGNOSIS — E1151 Type 2 diabetes mellitus with diabetic peripheral angiopathy without gangrene: Secondary | ICD-10-CM | POA: Diagnosis present

## 2016-08-16 DIAGNOSIS — I359 Nonrheumatic aortic valve disorder, unspecified: Secondary | ICD-10-CM | POA: Diagnosis present

## 2016-08-16 DIAGNOSIS — I10 Essential (primary) hypertension: Secondary | ICD-10-CM | POA: Diagnosis not present

## 2016-08-16 DIAGNOSIS — Z803 Family history of malignant neoplasm of breast: Secondary | ICD-10-CM

## 2016-08-16 DIAGNOSIS — N183 Chronic kidney disease, stage 3 (moderate): Secondary | ICD-10-CM | POA: Diagnosis present

## 2016-08-16 DIAGNOSIS — Z66 Do not resuscitate: Secondary | ICD-10-CM | POA: Diagnosis present

## 2016-08-16 DIAGNOSIS — I129 Hypertensive chronic kidney disease with stage 1 through stage 4 chronic kidney disease, or unspecified chronic kidney disease: Secondary | ICD-10-CM | POA: Diagnosis present

## 2016-08-16 DIAGNOSIS — I255 Ischemic cardiomyopathy: Secondary | ICD-10-CM | POA: Diagnosis present

## 2016-08-16 DIAGNOSIS — C3490 Malignant neoplasm of unspecified part of unspecified bronchus or lung: Secondary | ICD-10-CM | POA: Diagnosis not present

## 2016-08-16 DIAGNOSIS — E785 Hyperlipidemia, unspecified: Secondary | ICD-10-CM | POA: Diagnosis not present

## 2016-08-16 DIAGNOSIS — I2510 Atherosclerotic heart disease of native coronary artery without angina pectoris: Secondary | ICD-10-CM | POA: Diagnosis not present

## 2016-08-16 DIAGNOSIS — I638 Other cerebral infarction: Principal | ICD-10-CM | POA: Diagnosis present

## 2016-08-16 DIAGNOSIS — E039 Hypothyroidism, unspecified: Secondary | ICD-10-CM | POA: Diagnosis present

## 2016-08-16 DIAGNOSIS — Z8249 Family history of ischemic heart disease and other diseases of the circulatory system: Secondary | ICD-10-CM

## 2016-08-16 DIAGNOSIS — E1169 Type 2 diabetes mellitus with other specified complication: Secondary | ICD-10-CM | POA: Diagnosis present

## 2016-08-16 DIAGNOSIS — Z91041 Radiographic dye allergy status: Secondary | ICD-10-CM

## 2016-08-16 DIAGNOSIS — D649 Anemia, unspecified: Secondary | ICD-10-CM | POA: Diagnosis present

## 2016-08-16 DIAGNOSIS — C799 Secondary malignant neoplasm of unspecified site: Secondary | ICD-10-CM | POA: Diagnosis not present

## 2016-08-16 DIAGNOSIS — I252 Old myocardial infarction: Secondary | ICD-10-CM

## 2016-08-16 DIAGNOSIS — Z888 Allergy status to other drugs, medicaments and biological substances status: Secondary | ICD-10-CM

## 2016-08-16 DIAGNOSIS — I6789 Other cerebrovascular disease: Secondary | ICD-10-CM | POA: Diagnosis not present

## 2016-08-16 DIAGNOSIS — H9190 Unspecified hearing loss, unspecified ear: Secondary | ICD-10-CM | POA: Diagnosis present

## 2016-08-16 DIAGNOSIS — N184 Chronic kidney disease, stage 4 (severe): Secondary | ICD-10-CM | POA: Diagnosis present

## 2016-08-16 LAB — CBC
HCT: 30.7 % — ABNORMAL LOW (ref 36.0–46.0)
HEMOGLOBIN: 9.8 g/dL — AB (ref 12.0–15.0)
MCH: 29.3 pg (ref 26.0–34.0)
MCHC: 31.9 g/dL (ref 30.0–36.0)
MCV: 91.6 fL (ref 78.0–100.0)
Platelets: 248 10*3/uL (ref 150–400)
RBC: 3.35 MIL/uL — ABNORMAL LOW (ref 3.87–5.11)
RDW: 17.1 % — ABNORMAL HIGH (ref 11.5–15.5)
WBC: 6.9 10*3/uL (ref 4.0–10.5)

## 2016-08-16 LAB — DIFFERENTIAL
Basophils Absolute: 0 10*3/uL (ref 0.0–0.1)
Basophils Relative: 0 %
EOS PCT: 12 %
Eosinophils Absolute: 0.8 10*3/uL — ABNORMAL HIGH (ref 0.0–0.7)
LYMPHS ABS: 2.3 10*3/uL (ref 0.7–4.0)
Lymphocytes Relative: 33 %
MONO ABS: 0.6 10*3/uL (ref 0.1–1.0)
Monocytes Relative: 8 %
Neutro Abs: 3.2 10*3/uL (ref 1.7–7.7)
Neutrophils Relative %: 47 %

## 2016-08-16 LAB — GLUCOSE, CAPILLARY: GLUCOSE-CAPILLARY: 87 mg/dL (ref 65–99)

## 2016-08-16 LAB — COMPREHENSIVE METABOLIC PANEL
ALBUMIN: 3.3 g/dL — AB (ref 3.5–5.0)
ALT: 16 U/L (ref 14–54)
AST: 24 U/L (ref 15–41)
Alkaline Phosphatase: 72 U/L (ref 38–126)
Anion gap: 8 (ref 5–15)
BUN: 54 mg/dL — ABNORMAL HIGH (ref 6–20)
CALCIUM: 9.1 mg/dL (ref 8.9–10.3)
CO2: 25 mmol/L (ref 22–32)
CREATININE: 2.13 mg/dL — AB (ref 0.44–1.00)
Chloride: 105 mmol/L (ref 101–111)
GFR, EST AFRICAN AMERICAN: 23 mL/min — AB (ref >60)
GFR, EST NON AFRICAN AMERICAN: 20 mL/min — AB (ref >60)
Glucose, Bld: 127 mg/dL — ABNORMAL HIGH (ref 65–99)
Potassium: 4.1 mmol/L (ref 3.5–5.1)
Sodium: 138 mmol/L (ref 135–145)
Total Bilirubin: 0.4 mg/dL (ref 0.3–1.2)
Total Protein: 7.7 g/dL (ref 6.5–8.1)

## 2016-08-16 LAB — I-STAT TROPONIN, ED: TROPONIN I, POC: 0 ng/mL (ref 0.00–0.08)

## 2016-08-16 LAB — PROTIME-INR
INR: 1.02
Prothrombin Time: 13.4 seconds (ref 11.4–15.2)

## 2016-08-16 LAB — APTT: aPTT: 32 seconds (ref 24–36)

## 2016-08-16 MED ORDER — ALPRAZOLAM 0.25 MG PO TABS
0.2500 mg | ORAL_TABLET | Freq: Every day | ORAL | Status: DC
  Administered 2016-08-17 (×2): 0.25 mg via ORAL
  Filled 2016-08-16 (×2): qty 1

## 2016-08-16 MED ORDER — LEVOTHYROXINE SODIUM 50 MCG PO TABS
50.0000 ug | ORAL_TABLET | Freq: Every day | ORAL | Status: DC
  Administered 2016-08-17: 50 ug via ORAL
  Filled 2016-08-16: qty 1

## 2016-08-16 MED ORDER — SODIUM CHLORIDE 0.9 % IV SOLN
INTRAVENOUS | Status: DC
  Administered 2016-08-17: 02:00:00 via INTRAVENOUS

## 2016-08-16 MED ORDER — ACETAMINOPHEN 160 MG/5ML PO SOLN: 650.0000 mg | ORAL | Status: DC | PRN

## 2016-08-16 MED ORDER — SENNOSIDES-DOCUSATE SODIUM 8.6-50 MG PO TABS: 1.0000 | ORAL_TABLET | Freq: Every evening | ORAL | Status: DC | PRN

## 2016-08-16 MED ORDER — INSULIN ASPART 100 UNIT/ML ~~LOC~~ SOLN
0.0000 [IU] | Freq: Three times a day (TID) | SUBCUTANEOUS | Status: DC
  Administered 2016-08-17 – 2016-08-18 (×4): 2 [IU] via SUBCUTANEOUS
  Administered 2016-08-18: 1 [IU] via SUBCUTANEOUS

## 2016-08-16 MED ORDER — CITALOPRAM HYDROBROMIDE 10 MG PO TABS
20.0000 mg | ORAL_TABLET | Freq: Every day | ORAL | Status: DC
  Administered 2016-08-17: 20 mg via ORAL
  Filled 2016-08-16 (×2): qty 2

## 2016-08-16 MED ORDER — INSULIN GLARGINE 100 UNIT/ML ~~LOC~~ SOLN
12.0000 [IU] | Freq: Every day | SUBCUTANEOUS | Status: DC
Start: 2016-08-17 — End: 2016-08-18
  Administered 2016-08-17 – 2016-08-18 (×2): 12 [IU] via SUBCUTANEOUS
  Filled 2016-08-16 (×2): qty 0.12

## 2016-08-16 MED ORDER — FOLIC ACID 1 MG PO TABS
1.0000 mg | ORAL_TABLET | Freq: Every day | ORAL | Status: DC
  Administered 2016-08-17 – 2016-08-18 (×2): 1 mg via ORAL
  Filled 2016-08-16 (×2): qty 1

## 2016-08-16 MED ORDER — ASPIRIN EC 81 MG PO TBEC
81.0000 mg | DELAYED_RELEASE_TABLET | Freq: Every day | ORAL | Status: DC
  Filled 2016-08-16: qty 1

## 2016-08-16 MED ORDER — ACETAMINOPHEN 325 MG PO TABS: 650.0000 mg | ORAL_TABLET | ORAL | Status: DC | PRN

## 2016-08-16 MED ORDER — BRINZOLAMIDE 1 % OP SUSP
1.0000 [drp] | Freq: Two times a day (BID) | OPHTHALMIC | Status: DC
  Administered 2016-08-17 – 2016-08-18 (×4): 1 [drp] via OPHTHALMIC
  Filled 2016-08-16: qty 10

## 2016-08-16 MED ORDER — CALCITRIOL 0.25 MCG PO CAPS
0.2500 ug | ORAL_CAPSULE | ORAL | Status: DC
  Administered 2016-08-18: 0.25 ug via ORAL
  Filled 2016-08-16: qty 1

## 2016-08-16 MED ORDER — GADOBENATE DIMEGLUMINE 529 MG/ML IV SOLN: 15.0000 mL | Freq: Once | INTRAVENOUS | Status: DC | PRN

## 2016-08-16 MED ORDER — FEBUXOSTAT 40 MG PO TABS
40.0000 mg | ORAL_TABLET | Freq: Every day | ORAL | Status: DC
  Administered 2016-08-17 – 2016-08-18 (×2): 40 mg via ORAL
  Filled 2016-08-16 (×2): qty 1

## 2016-08-16 MED ORDER — ISOSORBIDE MONONITRATE ER 60 MG PO TB24
60.0000 mg | ORAL_TABLET | Freq: Every day | ORAL | Status: DC
  Administered 2016-08-17: 60 mg via ORAL
  Filled 2016-08-16 (×2): qty 1

## 2016-08-16 MED ORDER — LATANOPROST 0.005 % OP SOLN
1.0000 [drp] | Freq: Every day | OPHTHALMIC | Status: DC
  Administered 2016-08-17 (×2): 1 [drp] via OPHTHALMIC
  Filled 2016-08-16: qty 2.5

## 2016-08-16 MED ORDER — CLOPIDOGREL BISULFATE 75 MG PO TABS
75.0000 mg | ORAL_TABLET | Freq: Every day | ORAL | Status: DC
  Administered 2016-08-17: 75 mg via ORAL
  Filled 2016-08-16 (×2): qty 1

## 2016-08-16 MED ORDER — ACETAMINOPHEN 650 MG RE SUPP: 650.0000 mg | RECTAL | Status: DC | PRN

## 2016-08-16 MED ORDER — ONDANSETRON HCL 4 MG/2ML IJ SOLN
4.0000 mg | Freq: Once | INTRAMUSCULAR | Status: DC
  Filled 2016-08-16: qty 2

## 2016-08-16 MED ORDER — MIRTAZAPINE 15 MG PO TABS
15.0000 mg | ORAL_TABLET | Freq: Every day | ORAL | Status: DC
  Administered 2016-08-17 (×2): 15 mg via ORAL
  Filled 2016-08-16 (×2): qty 1

## 2016-08-16 MED ORDER — INSULIN ASPART 100 UNIT/ML ~~LOC~~ SOLN: 0.0000 [IU] | Freq: Every day | SUBCUTANEOUS | Status: DC

## 2016-08-16 MED ORDER — ATORVASTATIN CALCIUM 80 MG PO TABS
80.0000 mg | ORAL_TABLET | Freq: Every day | ORAL | Status: DC
  Administered 2016-08-17: 80 mg via ORAL
  Filled 2016-08-16 (×2): qty 1

## 2016-08-16 MED ORDER — STROKE: EARLY STAGES OF RECOVERY BOOK
Freq: Once | Status: AC
  Administered 2016-08-17: 01:00:00
  Filled 2016-08-16: qty 1

## 2016-08-16 NOTE — ED Notes (Addendum)
Hold off on lab work until MD speaks with neurology.

## 2016-08-16 NOTE — ED Notes (Addendum)
The pt declines lab work in triage, she had labs drawn on 4/12 and would like to wait until she sees the doctor to determine if she needs to be stuck again today

## 2016-08-16 NOTE — H&P (Signed)
Erica Jimenez SFK:[id number] DOB: 12-25-1929 DOA: 08/16/2016     PCP: [name] (Inactive)   Outpatient Specialists: Oncology mohamed, pulmonology Elsworth Soho, Cardiology Aundra Dubin in the past, Nephrology Lorrene Reid Patient coming from:   home Lives  With family    Chief Complaint:  Abnormal MRI  HPI: Erica Jimenez is a 83 y.o. female with medical history significant of stage IV lung CA, aortic stenosis, anxiety, and anemia, gout, history of bladder cancer status post resection followed by BCG, coronary heart disease status post stent placement, chronic kidney disease, COPD, diabetes mellitus, hypertension, dyslipidemia and TIA.     Presented with  Patient was seen  by oncology on April 12 for initiation of workup and staging MRI of the brain was ordered to help stage her squamous cell carcinoma of the lung. The plan was for patient to start chemotherapy. Verdis Frederickson showed acute CVA and patient was sent to emergency department instead she has been asymptomatic denies any weakness or dysarthria. No trouble walking   Regarding pertinent Chronic problems: recently diagnosed Lung CA Quit smoking   in February 2018. CT scan of the chest on 07/08/2016 and it showed large central right upper lobe lung mass surrounding and obstructing the right upper lobe bronchus and it measured 3.6 x 3.4 cm was evidence of endobronchial spread of tumor into the right lung apex. There was enlarging right apical lung mass and a small satellite pulmonary lesions. There is also similar right lower lobe lung mass surrounding and obstructing right lower lobe bronchi with evidence of endobronchial spread of tumor. This lesion measured 3.7 x 3.5 cm. There was a second mass in the superior segment of the right lower lobe which measured 3.8 x 2.5 cm. This is likely secondary to endobronchial spread of tumor from the right hilar mass. There was also small left upper lobe pulmonary nodule.  CT-guided core biopsy of the right lower lobe  lung mass was performed on 07/28/2016 and  showed squamous cell carcinoma.  HX of CAD REPORTS HAD A STENT ON PLAVIX FOR YEARS   Hx of CKD  stage III followed by nephrology stable Cr   Carotid artery disease: last Carotid doppler was in February showing Hard plaque with shadowing in the right bifurcation. Heterogeneous plaque in the left bifurcation. Essentially stable 40-59% RICA stenosis, per peak systolic velocity and ICA/CCA ratio. Stable 1-74% LICA stenosis. >50% RECA stenosis. Normal subclavian arteries, bilaterally. Patent vertebral arteries with antegrade flow  IN ER:  Temp (24hrs), Avg:97.8 F (36.6 C), Min:97.8 F (36.6 C), Max:97.8 F (36.6 C)       RR 24 100% HR 54 (chronic) BP 157/56 Trop 0.00 WBC 6/9 Hg 9.8 at baseline  Cr 2.2 at baseline   Following Medications were ordered in ER: Medications - No data to display   ER provider discussed case with:  Neurology who recommends admission from CVA work up   Hospitalist was called for admission for CVA  Review of Systems:    Pertinent positives include: fatigue, weight loss   Constitutional:  No weight loss, night sweats, Fevers, chills,  HEENT:  No headaches, Difficulty swallowing,Tooth/dental problems,Sore throat,  No sneezing, itching, ear ache, nasal congestion, post nasal drip,  Cardio-vascular:  No chest pain, Orthopnea, PND, anasarca, dizziness, palpitations.no Bilateral lower extremity swelling  GI:  No heartburn, indigestion, abdominal pain, nausea, vomiting, diarrhea, change in bowel habits, loss of appetite, melena, blood in stool, hematemesis Resp:  no shortness of breath at rest. No dyspnea on exertion,  No excess mucus, no productive cough, No non-productive cough, No coughing up of blood.No change in color of mucus.No wheezing. Skin:  no rash or lesions. No jaundice GU:  no dysuria, change in color of urine, no urgency or frequency. No straining to urinate.  No flank pain.  Musculoskeletal:    No joint pain or no joint swelling. No decreased range of motion. No back pain.  Psych:  No change in mood or affect. No depression or anxiety. No memory loss.  Neuro: no localizing neurological complaints, no tingling, no weakness, no double vision, no gait abnormality, no slurred speech, no confusion  As per HPI otherwise 10 point review of systems negative.   Past Medical History: Past Medical History:  Diagnosis Date  . A-V fistula ([place])    pt has 2  fistulas in rt arm  . Alopecia   . Anemia   . Anxiety   . Aortic stenosis   . Arthritis    gout  . Bladder cancer [facility])     s/p resection and BCG treatment.   Marland Kitchen CAD (coronary artery disease)     Pt is s/p anterior MI in 1996 with stent placed in LAD.  Last myoview in our office was in 7/05 and  showed apical anterior, apical, and apical inferior infarct.  Minimal peri-infarct ischemia.  EF 49%.   . Cardiomyopathy, ischemic   . CKD (chronic kidney disease)     Pt is stage III-IV.  She had a fistula placed but is not on dialysis.  She follows with Dr. Lorrene Reid for  nephrology.   Marland Kitchen COPD (chronic obstructive pulmonary disease) ([place])   . CVA (cerebral vascular accident) ([place])   . DM (diabetes mellitus) ([place])   . Full dentures   . Gout   . History of glaucoma   . HOH (hard of hearing)    slightly  . HTN (hypertension)   . Hyperlipidemia   . Hypothyroidism   . Myocardial infarction ([place])   . PAD (peripheral artery disease) ([facility])     arterial doppler study 12/04 suggestive of > 50% bilateral SFA stenosis.  Pt has mild claudication.  No invasive evaluation given CKD and desire to avoid contrast use.   Marland Kitchen Shortness of breath dyspnea    on exertion  . Wears glasses    Past Surgical History:  Procedure Laterality Date  . ABDOMINAL AORTAGRAM N/A 08/17/2013   Procedure: ABDOMINAL Maxcine Ham;  Surgeon: Wellington Hampshire, MD;  Location: [facility];  Service: Cardiovascular;  Laterality: N/A;  . ANGIOPLASTY    .  CYSTOSCOPY/RETROGRADE/URETEROSCOPY Bilateral 01/09/2015   Procedure: CYSTOSCOPY BILATERAL RETROGRADE PYELOGRAM, WASHINGS RENAL PELVIS & BLADDER ;  Surgeon: Lowella Bandy, MD;  Location[facility];  Service: Urology;  Laterality: Bilateral;  . CYSTOSTOMY W/ BLADDER BIOPSY    . EYE SURGERY Bilateral    cataract w IOL     Social History:  Ambulatory   independently      reports that she quit smoking about 6 weeks ago. Her smoking use included Cigarettes. She has a 21.00 pack-year smoking history. She has never used smokeless tobacco. She reports that she drinks about 0.6 oz of alcohol per week . She reports that she does not use drugs.  Allergies:   Allergies  Allergen Reactions  . Contrast Media [Iodinated Diagnostic Agents] Other (See Comments)    Renal insufficiency  . Fluoxetine Rash  . Sulfonamide Derivatives Nausea Only       Family History:  Family History  Problem Relation Age of Onset  . Breast cancer Mother   . Tuberculosis Father   . Heart disease Maternal Aunt     Medications: Prior to Admission medications   Medication Sig Start Date End Date Taking? Authorizing Provider  ALPRAZolam (XANAX) 0.25 MG tablet Take 0.25 mg by mouth at bedtime.    Yes Historical Provider, MD  amLODipine (NORVASC) 5 MG tablet take 1 tablet by mouth once daily 06/11/15  Yes Larey Dresser, MD  aspirin 81 MG EC tablet Take 81 mg by mouth daily after supper. Swallow whole.   Yes Historical Provider, MD  atorvastatin (LIPITOR) 80 MG tablet Take 80 mg by mouth daily after supper.    Yes Historical Provider, MD  bisoprolol (ZEBETA) 5 MG tablet Take 0.5 tablets (2.5 mg total) by mouth daily. *Please keep upcoming appointment for further refills* Patient taking differently: Take 2.5 mg by mouth daily after supper. *Please keep upcoming appointment for further refills* 07/17/16  Yes Larey Dresser, MD  brinzolamide (AZOPT) 1 % ophthalmic suspension Place 1 drop into both eyes 2 (two)  times daily.    Yes Historical Provider, MD  calcitRIOL (ROCALTROL) 0.25 MCG capsule Take 0.25 mcg by mouth every Monday, Wednesday, and Friday.    Yes Historical Provider, MD  Carboxymethylcellulose Sodium (REFRESH TEARS OP) Place 1 drop into both eyes 3 (three) times daily as needed (dry eyes). Both eyes.   Yes Historical Provider, MD  citalopram (CELEXA) 20 MG tablet Take 20 mg by mouth daily after supper.    Yes Historical Provider, MD  clopidogrel (PLAVIX) 75 MG tablet Take 75 mg by mouth daily after supper.    Yes Historical Provider, MD  febuxostat (ULORIC) 40 MG tablet Take 40 mg by mouth daily.   Yes Historical Provider, MD  Ferrous Gluconate (FERGON PO) Take 1 tablet by mouth daily after supper.    Yes Historical Provider, MD  folic acid (FOLVITE) 1 MG tablet Take 1 mg by mouth daily.   Yes Historical Provider, MD  furosemide (LASIX) 40 MG tablet Take 40 mg by mouth daily.    Yes Historical Provider, MD  hydrALAZINE (APRESOLINE) 50 MG tablet Take 1 tablet (50 mg total) by mouth 2 (two) times daily. 08/11/16  Yes Larey Dresser, MD  insulin glargine (LANTUS) 100 unit/mL SOPN Inject 12 Units into the skin daily before breakfast.   Yes Historical Provider, MD  insulin lispro (HUMALOG KWIKPEN) 100 UNIT/ML KiwkPen Inject 2-3 Units into the skin 3 (three) times daily before meals.   Yes Historical Provider, MD  isosorbide mononitrate (IMDUR) 60 MG 24 hr tablet Take 60 mg by mouth daily after supper.    Yes Historical Provider, MD  latanoprost (XALATAN) 0.005 % ophthalmic solution Place 1 drop into both eyes at bedtime.    Yes Historical Provider, MD  levothyroxine (SYNTHROID, LEVOTHROID) 50 MCG tablet Take 50 mcg by mouth daily after supper.    Yes Historical Provider, MD  lidocaine-prilocaine (EMLA) cream Apply 1 application topically as needed. Patient taking differently: Apply 1 application topically daily as needed (vaginal itching).  08/14/16  Yes Curt Bears, MD  mirtazapine (REMERON) 30  MG tablet Take 15 mg by mouth at bedtime. 06/18/16  Yes Historical Provider, MD  nitroGLYCERIN (NITROSTAT) 0.4 MG SL tablet Place 1 tablet (0.4 mg total) under the tongue every 5 (five) minutes as needed for chest pain. 10/26/14  Yes Larey Dresser, MD  Phenylephrine-APAP-Guaifenesin ([place]) [phone] MG/20ML LIQD Take  20 mLs by mouth 2 (two) times daily as needed (scratchy throat). Per bottle as needed    Yes Historical Provider, MD  prochlorperazine (COMPAZINE) 10 MG tablet Take 1 tablet (10 mg total) by mouth every 6 (six) hours as needed for nausea or vomiting. 08/14/16  Yes Curt Bears, MD  senna (SENOKOT) 8.6 MG tablet Take 1 tablet by mouth daily as needed for constipation.    Yes Historical Provider, MD    Physical Exam: Patient Vitals for the past 24 hrs:  BP Temp Temp src Pulse Resp SpO2 Height Weight  08/16/16 1950 (!) 157/56 - - (!) 54 (!) 24 100 % - -  08/16/16 1930 (!) 145/55 - - (!) 53 (!) 21 98 % - -  08/16/16 1915 (!) 146/62 - - (!) 51 (!) 21 99 % - -  08/16/16 1900 (!) 153/60 - - (!) 56 (!) 25 100 % - -  08/16/16 1709 139/60 97.8 F (36.6 C) Oral (!) 58 16 99 % 5' 4.5" (1.638 m) 64.9 kg (143 lb)    1. General:  in No Acute distress 2. Psychological: Alert and   Oriented 3. Head/ENT:   Moist Mucous Membranes                          Head Non traumatic, neck supple                            Poor Dentition 4. SKIN:   decreased Skin turgor,  Skin clean Dry and intact no rash 5. Heart: Regular rate and rhythm systolic Murmur, Rub or gallop 6. Lungs: no wheezes some crackles   7. Abdomen: Soft,  non-tender, Non distended 8. Lower extremities: no clubbing, cyanosis, or edema 9. Neurologically strength 5 out of 5 in all 4 extremities cranial nerves II through XII intact 10. MSK: Normal range of motion   body mass index is 24.17 kg/m.  Labs on Admission:   Labs on Admission: I have personally reviewed following labs and imaging  studies  CBC:  Recent Labs Lab 08/14/16 1302 08/16/16 1948  WBC 7.3 6.9  NEUTROABS 3.9 3.2  HGB 10.4* 9.8*  HCT 31.8* 30.7*  MCV 91.6 91.6  PLT 254 240   Basic Metabolic Panel:  Recent Labs Lab 08/14/16 1302 08/16/16 1948  NA 138 138  K 4.9 4.1  CL  --  105  CO2 24 25  GLUCOSE 208* 127*  BUN 56.0* 54*  CREATININE 2.3* 2.13*  CALCIUM 9.4 9.1   GFR: Estimated Creatinine Clearance: 17.4 mL/min (A) (by C-G formula based on SCr of 2.13 mg/dL (H)). Liver Function Tests:  Recent Labs Lab 08/14/16 1302 08/16/16 1948  AST 22 24  ALT 13 16  ALKPHOS 76 72  BILITOT 0.39 0.4  PROT 7.4 7.7  ALBUMIN 3.2* 3.3*   No results for input(s): LIPASE, AMYLASE in the last 168 hours. No results for input(s): AMMONIA in the last 168 hours. Coagulation Profile:  Recent Labs Lab 08/16/16 1948  INR 1.02   Cardiac Enzymes: No results for input(s): CKTOTAL, CKMB, CKMBINDEX, TROPONINI in the last 168 hours. BNP (last 3 results) No results for input(s): PROBNP in the last 8760 hours. HbA1C: No results for input(s): HGBA1C in the last 72 hours. CBG: No results for input(s): GLUCAP in the last 168 hours. Lipid Profile: No results for input(s): CHOL, HDL, LDLCALC, TRIG, CHOLHDL, LDLDIRECT in the last 72  hours. Thyroid Function Tests: No results for input(s): TSH, T4TOTAL, FREET4, T3FREE, THYROIDAB in the last 72 hours. Anemia Panel: No results for input(s): VITAMINB12, FOLATE, FERRITIN, TIBC, IRON, RETICCTPCT in the last 72 hours. Urine analysis:  Sepsis Labs: '@LABRCNTIP'$ (procalcitonin:4,lacticidven:4) )No results found for this or any previous visit (from the past 240 hour(s)).    UA  ordered  No results found for: HGBA1C  Estimated Creatinine Clearance: 17.4 mL/min (A) (by C-G formula based on SCr of 2.13 mg/dL (H)).  BNP (last 3 results) No results for input(s): PROBNP in the last 8760 hours.   ECG REPORT  Independently reviewed Rate:58  Rhythm: RBBB sinus  bradycardia ST&T Change: No acute ischemic changes   QTC 458  Filed Weights   08/16/16 1709  Weight: 64.9 kg (143 lb)     Cultures: No results found for: SDES, SPECREQUEST, CULT, REPTSTATUS   Radiological Exams on Admission: [name] Contrast  Result Date: 08/16/2016 CLINICAL DATA:  Stage IV squamous cell carcinoma of the Lung EXAM: MRI HEAD WITHOUT CONTRAST TECHNIQUE: Multiplanar, multiecho pulse sequences of the brain and surrounding structures were obtained without intravenous contrast. COMPARISON:  CT head without contrast 05/11/2010. FINDINGS: Brain: A 4 mm acute nonhemorrhagic white matter infarct is present in the left temporal lobe adjacent to the left lateral ventricle. No other acute infarcts are present. Remote bilateral parietal lobe infarcts are present. Advanced atrophy and white matter disease is again seen bilaterally. Remote occipital lobe infarcts are present. A remote infarct is present in the anterior left frontal lobe. No focal mass lesion is present. The ventricles are proportionate to the degree of atrophy with areas of ex vacuo dilation. White matter changes extend into the brainstem. Left greater than right remote cerebellar infarcts are present. No acute hemorrhage is present. No significant extraaxial fluid collection is present. Vascular: Flow is present in the major intracranial arteries. Skull and upper cervical spine: The skullbase is within normal limits. The craniocervical junction is normal. Midline sagittal structures are within normal limits. The upper cervical spine is unremarkable. Marrow signal is normal. Sinuses/Orbits: Mid left ethmoid air cells are opacified. The remaining paranasal sinuses and mastoid air cells are clear. Bilateral lens replacements are present. The globes and orbits are otherwise within normal limits. IMPRESSION: 1. Acute nonhemorrhagic 4 mm white matter infarct in the left temporal lobe. 2. Remote cortical infarcts bilaterally involving  both parietal lobes, both occipital lobes, in the anterior left frontal lobe. 3. Remote white matter infarcts involving the left greater than right cerebellum. 4. Minimal left ethmoid sinus disease. These results were called by telephone at the time of interpretation on 08/16/2016 at 4:37 pm to Dr. Curt Bears , who verbally acknowledged these results. Dr. [name] asked that the patient be directed to the [facility] Emergency Department for further evaluation. The MRI technologist communicated this to the patient who agreed to proceed to the [facility] Emergency Department. Electronically Signed   By: San Morelle M.D.   On: 08/16/2016 16:39    Chart has been reviewed    Assessment/Plan  83 y.o. female with medical history significant of stage IV lung CA, aortic stenosis, anxiety, and anemia, gout, history of bladder cancer status post resection followed by BCG, coronary heart disease status post stent placement, chronic kidney disease, COPD, diabetes mellitus, hypertension, dyslipidemia and TIA. Admitted for new CVA  Present on Admission: . CVA (cerebral vascular accident) [facility]) -  - will admit based on TIA/CVA protocol, await results of MRA , Carotid  Doppler was done 2 months ago will hold off for now on repeating, order TTE may need TEE to rule out embolic source given 2 small CVAs worrisome for possible embolism, obtain cardiac enzymes,  ECG,   Lipid panel, TSH. Order PT/OT evaluation. Will make sure patient is on antiplatelet agent.   Neurology consulted.      . CAD, NATIVE VESSEL - chronic and currently stable continue statins and aspirin continue Plavix . Essential hypertension given new CVA to allow permissive hypertension . Stage IV squamous cell carcinoma of right lung [facility]) Will notify oncology patient is here at [facility] in [place] as consult  . Aortic valve disorder - chronic repeat echogram asymptomatic . Hyperlipidemia continue statin . Type 2 diabetes mellitus with  hyperlipidemia (HCC) continue Lantus and order sliding scale . CKD (chronic kidney disease), stage III currently at baseline avoid nephrotoxic medications . Anemia baseline we will obtain anemia panel   Other plan as per orders.  DVT prophylaxis:  SCD    Code Status:    DNR/DNI   as per patient want to have her body Donated  Family Communication:   Friend  at  Bedside  plan of care was discussed with friend  Disposition Plan:     To home once workup is complete and patient is stable                        Would benefit from PT/OT eval prior to DC  ordered                      Diabetes coordinator                              Consults called: Neurology    Admission status: inpatient   Level of care     tele            I have spent a total of  56 min on this admission   Juel Ripley 08/16/2016, 11:01 PM    [facility]  Pager [phone]   after 2 AM please page floor coverage PA If 7AM-7PM, please contact the day team taking care of the patient  [email or web address]  Password [email or web address]

## 2016-08-16 NOTE — ED Provider Notes (Signed)
[facility] DEPT Provider Note   CSN: [id number] Arrival date & time: 08/16/16  1658     History   Chief Complaint Chief Complaint  Patient presents with  . Abnormal Lab    HPI Erica Jimenez is a 83 y.o. female.  Patient with hx ca, htn, cad, presents after having an mri ordered by her cancer doctor.  MRI was done for ca screening purposes.  Patient denies any recent change in speech or vision. No change in gait or balance. No numbness/weakness, or change in normal functional ability. Mri was read as showing tiny cva and pt was sent to ED.  Pt indicates today she feels at baseline, no new c/o.    The history is provided by the patient and a relative.  Abnormal Lab    Past Medical History:  Diagnosis Date  . A-V fistula ([place])    pt has 2  fistulas in rt arm  . Alopecia   . Anemia   . Anxiety   . Aortic stenosis   . Arthritis    gout  . Bladder cancer [facility])     s/p resection and BCG treatment.   Marland Kitchen CAD (coronary artery disease)     Pt is s/p anterior MI in 1996 with stent placed in LAD.  Last myoview in our office was in 7/05 and  showed apical anterior, apical, and apical inferior infarct.  Minimal peri-infarct ischemia.  EF 49%.   . Cardiomyopathy, ischemic   . CKD (chronic kidney disease)     Pt is stage III-IV.  She had a fistula placed but is not on dialysis.  She follows with Dr. Lorrene Reid for  nephrology.   Marland Kitchen COPD (chronic obstructive pulmonary disease) ([place])   . CVA (cerebral vascular accident) ([place])   . DM (diabetes mellitus) ([place])   . Full dentures   . Gout   . History of glaucoma   . HOH (hard of hearing)    slightly  . HTN (hypertension)   . Hyperlipidemia   . Hypothyroidism   . Myocardial infarction ([place])   . PAD (peripheral artery disease) ([facility])     arterial doppler study 12/04 suggestive of > 50% bilateral SFA stenosis.  Pt has mild claudication.  No invasive evaluation given CKD and desire to avoid contrast use.   Marland Kitchen Shortness of breath dyspnea    on exertion  . Wears glasses     Patient Active Problem List   Diagnosis Date Noted  . Stage IV squamous cell carcinoma of right lung ([place]) 08/14/2016  . Lung cancer ([place]) 08/12/2016  . Right lower lobe lung mass 07/21/2016  . Pulmonary infiltrates 05/20/2012  . Smoking 08/30/2010  . Aortic valve disorder 02/12/2009  . CARDIOMYOPATHY, ISCHEMIC 08/14/2008  . Hyperlipidemia 07/11/2008  . Essential hypertension 07/11/2008  . CAD, NATIVE VESSEL 07/11/2008  . BRADYCARDIA 07/11/2008  . Peripheral vascular disease ([place]) 07/11/2008  . CARDIAC MURMUR 07/11/2008  . CAROTID BRUIT 07/11/2008    Past Surgical History:  Procedure Laterality Date  . ABDOMINAL AORTAGRAM N/A 08/17/2013   Procedure: ABDOMINAL Maxcine Ham;  Surgeon: Wellington Hampshire, MD;  Location: [facility] CATH [facility];  Service: Cardiovascular;  Laterality: N/A;  . ANGIOPLASTY    . CYSTOSCOPY/RETROGRADE/URETEROSCOPY Bilateral 01/09/2015   Procedure: CYSTOSCOPY BILATERAL RETROGRADE PYELOGRAM, WASHINGS RENAL PELVIS & BLADDER ;  Surgeon: Lowella Bandy, MD;  Location: [facility];  Service: Urology;  Laterality: Bilateral;  . CYSTOSTOMY W/ BLADDER BIOPSY    . EYE SURGERY Bilateral  cataract w IOL    OB History    No data available       Home Medications    Prior to Admission medications   Medication Sig Start Date End Date Taking? Authorizing Provider  ALPRAZolam Duanne Moron) 0.25 MG tablet Take 0.25 mg by mouth daily as needed for anxiety.    Historical Provider, MD  amLODipine (NORVASC) 5 MG tablet take 1 tablet by mouth once daily 06/11/15   Larey Dresser, MD  aspirin 81 MG tablet Take 81 mg by mouth daily.      Historical Provider, MD  atorvastatin (LIPITOR) 80 MG tablet Take 80 mg by mouth daily.      Historical Provider, MD  bisoprolol (ZEBETA) 5 MG tablet Take 0.5 tablets (2.5 mg total) by mouth daily. *Please keep upcoming appointment for further refills* 07/17/16   Larey Dresser, MD  brinzolamide (AZOPT) 1 % ophthalmic  suspension Place 1 drop into both eyes daily.     Historical Provider, MD  calcitRIOL (ROCALTROL) 0.25 MCG capsule Take 0.25 mcg by mouth 3 (three) times a week. Monday, Wednesday, Friday    Historical Provider, MD  Carboxymethylcellulose Sodium (REFRESH TEARS OP) Apply 1 drop to eye 2 (two) times daily. Both eyes.    Historical Provider, MD  citalopram (CELEXA) 20 MG tablet Take 20 mg by mouth at bedtime.    Historical Provider, MD  clopidogrel (PLAVIX) 75 MG tablet Take 75 mg by mouth daily.      Historical Provider, MD  febuxostat (ULORIC) 40 MG tablet Take 40 mg by mouth daily.    Historical Provider, MD  Ferrous Gluconate (FERGON PO) Take 1 tablet by mouth daily.     Historical Provider, MD  FOLIC ACID PO Take 2.2 mg by mouth daily.     Historical Provider, MD  furosemide (LASIX) 40 MG tablet Take 40 mg by mouth daily as needed.     Historical Provider, MD  HUMALOG KWIKPEN 100 UNIT/ML KiwkPen Inject 2-5 Units as directed 3 (three) times daily. 05/16/15   Historical Provider, MD  hydrALAZINE (APRESOLINE) 50 MG tablet Take 1 tablet (50 mg total) by mouth 2 (two) times daily. 08/11/16   Larey Dresser, MD  isosorbide mononitrate (IMDUR) 60 MG 24 hr tablet Take 60 mg by mouth daily.      Historical Provider, MD  LANTUS SOLOSTAR 100 UNIT/ML Solostar Pen Inject 12 Units as directed daily.  02/14/15   Historical Provider, MD  latanoprost (XALATAN) 0.005 % ophthalmic solution Place 1 drop into both eyes at bedtime.     Historical Provider, MD  levothyroxine (SYNTHROID, LEVOTHROID) 50 MCG tablet Take 50 mcg by mouth daily before breakfast.     Historical Provider, MD  lidocaine-prilocaine (EMLA) cream Apply 1 application topically as needed. 08/14/16   Curt Bears, MD  mirtazapine (REMERON) 30 MG tablet Take 15 mg by mouth at bedtime. 06/18/16   Historical Provider, MD  nitroGLYCERIN (NITROSTAT) 0.4 MG SL tablet Place 1 tablet (0.4 mg total) under the tongue every 5 (five) minutes as needed for chest  pain. Patient not taking: Reported on 08/14/2016 10/26/14   Larey Dresser, MD  Phenylephrine-APAP-Guaifenesin ([place]) [phone] MG/20ML LIQD Per bottle as needed    Historical Provider, MD  prochlorperazine (COMPAZINE) 10 MG tablet Take 1 tablet (10 mg total) by mouth every 6 (six) hours as needed for nausea or vomiting. 08/14/16   Curt Bears, MD  senna (SENOKOT) 8.6 MG tablet Take 1 tablet by mouth as  needed for constipation.    Historical Provider, MD    Family History Family History  Problem Relation Age of Onset  . Breast cancer Mother   . Tuberculosis Father   . Heart disease Maternal Aunt     Social History Social History  Substance Use Topics  . Smoking status: Former Smoker    Packs/day: 0.35    Years: 60.00    Types: Cigarettes    Quit date: 06/30/2016  . Smokeless tobacco: Never Used  . Alcohol use 0.6 oz/week    1 Shots of liquor per week     Allergies   Contrast media [iodinated diagnostic agents]; Fluoxetine; and Sulfonamide derivatives   Review of Systems Review of Systems  Constitutional: Negative for chills and fever.  HENT: Negative for trouble swallowing.   Eyes: Negative for visual disturbance.  Respiratory: Negative for shortness of breath.   Cardiovascular: Negative for chest pain.  Gastrointestinal: Negative for abdominal pain.  Genitourinary: Negative for flank pain.  Musculoskeletal: Negative for back pain and neck pain.  Skin: Negative for rash.  Neurological: Negative for speech difficulty, weakness, numbness and headaches.  Hematological: Does not bruise/bleed easily.  Psychiatric/Behavioral: Negative for confusion.     Physical Exam Updated Vital Signs BP 139/60 (BP Location: Left Arm)   Pulse (!) 58   Temp 97.8 F (36.6 C) (Oral)   Resp 16   Ht 5' 4.5" (1.638 m)   Wt 64.9 kg   SpO2 99%   BMI 24.17 kg/m   Physical Exam  Constitutional: She is oriented to person, place, and time. She appears  well-developed and well-nourished. No distress.  HENT:  Mouth/Throat: Oropharynx is clear and moist.  Eyes: Conjunctivae and EOM are normal. Pupils are equal, round, and reactive to light. No scleral icterus.  Neck: Neck supple. No tracheal deviation present.  No bruits.   Cardiovascular: Normal rate, regular rhythm, normal heart sounds and intact distal pulses.  Exam reveals no gallop and no friction rub.   No murmur heard. Pulmonary/Chest: Effort normal and breath sounds normal. No respiratory distress.  Abdominal: Soft. Normal appearance. She exhibits no distension. There is no tenderness.  Musculoskeletal: She exhibits no edema.  Neurological: She is alert and oriented to person, place, and time. No cranial nerve deficit.  Speech fluent. Motor intact bil, stre 5/5. sens grossly intact. Steady gait. No pronator drift.   Skin: Skin is warm and dry. No rash noted.  Psychiatric: She has a normal mood and affect.  Nursing note and vitals reviewed.    ED Treatments / Results  Labs (all labs ordered are listed, but only abnormal results are displayed) Results for orders placed or performed during the hospital encounter of 08/16/16  Protime-INR  Result Value Ref Range   Prothrombin Time 13.4 11.4 - 15.2 seconds   INR 1.02   APTT  Result Value Ref Range   aPTT 32 24 - 36 seconds  CBC  Result Value Ref Range   WBC 6.9 4.0 - 10.5 K/uL   RBC 3.35 (L) 3.87 - 5.11 MIL/uL   Hemoglobin 9.8 (L) 12.0 - 15.0 g/dL   HCT 30.7 (L) 36.0 - 46.0 %   MCV 91.6 78.0 - 100.0 fL   MCH 29.3 26.0 - 34.0 pg   MCHC 31.9 30.0 - 36.0 g/dL   RDW 17.1 (H) 11.5 - 15.5 %   Platelets 248 150 - 400 K/uL  Differential  Result Value Ref Range   Neutrophils Relative % 47 %   Neutro  Abs 3.2 1.7 - 7.7 K/uL   Lymphocytes Relative 33 %   Lymphs Abs 2.3 0.7 - 4.0 K/uL   Monocytes Relative 8 %   Monocytes Absolute 0.6 0.1 - 1.0 K/uL   Eosinophils Relative 12 %   Eosinophils Absolute 0.8 (H) 0.0 - 0.7 K/uL    Basophils Relative 0 %   Basophils Absolute 0.0 0.0 - 0.1 K/uL  I-stat troponin, ED (not at [facility])  Result Value Ref Range   Troponin i, poc 0.00 0.00 - 0.08 ng/mL   Comment 3           [name] Contrast  Result Date: 08/16/2016 CLINICAL DATA:  Stage IV squamous cell carcinoma of the Lung EXAM: MRI HEAD WITHOUT CONTRAST TECHNIQUE: Multiplanar, multiecho pulse sequences of the brain and surrounding structures were obtained without intravenous contrast. COMPARISON:  CT head without contrast 05/11/2010. FINDINGS: Brain: A 4 mm acute nonhemorrhagic white matter infarct is present in the left temporal lobe adjacent to the left lateral ventricle. No other acute infarcts are present. Remote bilateral parietal lobe infarcts are present. Advanced atrophy and white matter disease is again seen bilaterally. Remote occipital lobe infarcts are present. A remote infarct is present in the anterior left frontal lobe. No focal mass lesion is present. The ventricles are proportionate to the degree of atrophy with areas of ex vacuo dilation. White matter changes extend into the brainstem. Left greater than right remote cerebellar infarcts are present. No acute hemorrhage is present. No significant extraaxial fluid collection is present. Vascular: Flow is present in the major intracranial arteries. Skull and upper cervical spine: The skullbase is within normal limits. The craniocervical junction is normal. Midline sagittal structures are within normal limits. The upper cervical spine is unremarkable. Marrow signal is normal. Sinuses/Orbits: Mid left ethmoid air cells are opacified. The remaining paranasal sinuses and mastoid air cells are clear. Bilateral lens replacements are present. The globes and orbits are otherwise within normal limits. IMPRESSION: 1. Acute nonhemorrhagic 4 mm white matter infarct in the left temporal lobe. 2. Remote cortical infarcts bilaterally involving both parietal lobes, both occipital  lobes, in the anterior left frontal lobe. 3. Remote white matter infarcts involving the left greater than right cerebellum. 4. Minimal left ethmoid sinus disease. These results were called by telephone at the time of interpretation on 08/16/2016 at 4:37 pm to Dr. Curt Bears , who verbally acknowledged these results. Dr. [name] asked that the patient be directed to the [facility] Emergency Department for further evaluation. The MRI technologist communicated this to the patient who agreed to proceed to the [facility] Emergency Department. Electronically Signed   By: San Morelle M.D.   On: 08/16/2016 16:39   Nm Pet Image Initial (pi) Skull Base To Thigh  Result Date: 07/30/2016 CLINICAL DATA:  Initial treatment strategy for lung mass. EXAM: NUCLEAR MEDICINE PET SKULL BASE TO THIGH TECHNIQUE: 6.8 mCi F-18 FDG was injected intravenously. Full-ring PET imaging was performed from the skull base to thigh after the radiotracer. CT data was obtained and used for attenuation correction and anatomic localization. FASTING BLOOD GLUCOSE:  Value: 162 mg/dl COMPARISON:  07/08/2016 FINDINGS: NECK No hypermetabolic lymph nodes in the neck. CHEST The heart size appears mildly enlarged. No pericardial effusion. Aortic atherosclerosis. Calcification in the RCA and LAD coronary artery noted. Right paratracheal lymph node measures 1.6 cm and has an SUV max equal to 5.95. Enlarged right hilar lymph node measures 1.6 cm and has an SUV max equal to  9.8, image 66 of series 4. Right upper lobe hypermetabolic mass measures 3.1 cm and has an SUV max equal to 6.8. There is evidence of endobronchial spread of tumor into the right upper lobe and right apex. Nodule in the right apex measures 3.3 cm and has an SUV max equal to 6.9. There is a hypermetabolic mass within the superior segment of the right lower lobe which measures 4.3 cm and has an SUV max equal to 14.69. There is postobstructive pneumonitis involving the right lung  base. Small nodule within the left apex measures 5 mm and is too small to reliably characterize. Calcified granuloma noted in the right lung base. ABDOMEN/PELVIS No abnormal hypermetabolic activity within the liver, pancreas, adrenal glands, or spleen. No hypermetabolic lymph nodes in the abdomen or pelvis. SKELETON No focal hypermetabolic activity to suggest skeletal metastasis. IMPRESSION: 1. Multifocal hypermetabolic lesions are identified within the right upper lobe and right lower lobe. There is also hypermetabolic right hilar adenopathy and right paratracheal adenopathy. Assuming non-small cell histology imaging findings would be compatible with T4N2M0 or stage IIIb disease. 2. Aortic atherosclerosis. Multi vessel coronary artery calcifications noted. Electronically Signed   By: Kerby Moors M.D.   On: 07/30/2016 14:47   Ct Biopsy  Result Date: 07/28/2016 INDICATION: Right lower lobe lung mass EXAM: CT BIOPSY MEDICATIONS: None. ANESTHESIA/SEDATION: Fentanyl 50 mcg IV; Versed 1.5 mg IV Moderate Sedation Time:  11 The patient was continuously monitored during the procedure by the interventional radiology nurse under my direct supervision. FLUOROSCOPY TIME:  None. COMPLICATIONS: None immediate. PROCEDURE: Informed written consent was obtained from the patient after a thorough discussion of the procedural risks, benefits and alternatives. All questions were addressed. Maximal Sterile Barrier Technique was utilized including caps, mask, sterile gowns, sterile gloves, sterile drape, hand hygiene and skin antiseptic. A timeout was performed prior to the initiation of the procedure. Under CT guidance, a(n) 17 gauge guide needle was advanced into the right lower lobe lung mass. Subsequently 3 18 gauge core biopsies were obtained. The guide needle was removed. Post biopsy images demonstrate no pneumothorax. Patient tolerated the procedure well without complication. Vital sign monitoring by nursing staff during the  procedure will continue as patient is in the special procedures unit for post procedure observation. FINDINGS: The images document guide needle placement within the right lower lobe lung mass. Post biopsy images demonstrate no pneumothorax. IMPRESSION: Successful CT-guided right lower lobe lung mass biopsy. Electronically Signed   By: Marybelle Killings M.D.   On: 07/28/2016 14:04   Dg Chest Port 1 View  Result Date: 07/28/2016 CLINICAL DATA:  Biopsy EXAM: PORTABLE CHEST 1 VIEW COMPARISON:  07/28/2016 FINDINGS: There is no pneumothorax after right lower lobe lung biopsy. Cardiomegaly persists. Right apical lung mass persists. Central right basilar lung mass and associated right lower lobe opacity is stable. Left lung is relatively clear. IMPRESSION: No pneumothorax post right lung biopsy. Electronically Signed   By: Marybelle Killings M.D.   On: 07/28/2016 13:57    EKG  EKG Interpretation  Date/Time:  Saturday August 16 2016 18:58:55 EDT Ventricular Rate:  53 PR Interval:    QRS Duration: 148 QT Interval:  519 QTC Calculation: 488 R Axis:   -79 Text Interpretation:  Sinus rhythm Right bundle branch block Nonspecific T wave abnormality No significant change since last tracing Confirmed by Ashok Cordia  MD, Lennette Bihari ([id number]) on 08/16/2016 8:13:00 PM       Radiology [name] Contrast  Result Date: 08/16/2016 CLINICAL DATA:  Stage IV squamous cell carcinoma of the Lung EXAM: MRI HEAD WITHOUT CONTRAST TECHNIQUE: Multiplanar, multiecho pulse sequences of the brain and surrounding structures were obtained without intravenous contrast. COMPARISON:  CT head without contrast 05/11/2010. FINDINGS: Brain: A 4 mm acute nonhemorrhagic white matter infarct is present in the left temporal lobe adjacent to the left lateral ventricle. No other acute infarcts are present. Remote bilateral parietal lobe infarcts are present. Advanced atrophy and white matter disease is again seen bilaterally. Remote occipital lobe infarcts are  present. A remote infarct is present in the anterior left frontal lobe. No focal mass lesion is present. The ventricles are proportionate to the degree of atrophy with areas of ex vacuo dilation. White matter changes extend into the brainstem. Left greater than right remote cerebellar infarcts are present. No acute hemorrhage is present. No significant extraaxial fluid collection is present. Vascular: Flow is present in the major intracranial arteries. Skull and upper cervical spine: The skullbase is within normal limits. The craniocervical junction is normal. Midline sagittal structures are within normal limits. The upper cervical spine is unremarkable. Marrow signal is normal. Sinuses/Orbits: Mid left ethmoid air cells are opacified. The remaining paranasal sinuses and mastoid air cells are clear. Bilateral lens replacements are present. The globes and orbits are otherwise within normal limits. IMPRESSION: 1. Acute nonhemorrhagic 4 mm white matter infarct in the left temporal lobe. 2. Remote cortical infarcts bilaterally involving both parietal lobes, both occipital lobes, in the anterior left frontal lobe. 3. Remote white matter infarcts involving the left greater than right cerebellum. 4. Minimal left ethmoid sinus disease. These results were called by telephone at the time of interpretation on 08/16/2016 at 4:37 pm to Dr. Curt Bears , who verbally acknowledged these results. Dr. [name] asked that the patient be directed to the [facility] Emergency Department for further evaluation. The MRI technologist communicated this to the patient who agreed to proceed to the [facility] Emergency Department. Electronically Signed   By: San Morelle M.D.   On: 08/16/2016 16:39    Procedures Procedures (including critical care time)  Medications Ordered in ED Medications - No data to display   Initial Impression / Assessment and Plan / ED Course  I have reviewed the triage vital signs and the nursing  notes.  Pertinent labs & imaging results that were available during my care of the patient were reviewed by me and considered in my medical decision making (see chart for details).  Reviewed mri.   Neurology consulted.  Pt remains asymptomatic.   Discussed with neurology, Dr Leonel Ramsay - he reviewed mri - indicates as 2 areas concern for cva, pt needs admit for cva/embolic source workup.   Hospitalists consulted for admission.  Recheck pt no change or new symptoms.     Final Clinical Impressions(s) / ED Diagnoses   Final diagnoses:  None    New Prescriptions New Prescriptions   No medications on file     Lajean Saver, MD 08/16/16 2029

## 2016-08-16 NOTE — Consult Note (Signed)
Neurology Consultation Reason for Consult: Incidental stroke Referring Physician: Selinda Orion  CC: Incidental stroke  History is obtained from: Patient  HPI: Erica Jimenez is a 83 y.o. female with a history of recently diagnosed squamous cell lung cancer presents with incidentally found ischemic strokes. She was undergoing brain MRI for routine screening and an incidentally found ischemic stroke in the left parietal region was discovered. She denies numbness, speech difficulty, weakness, visual change. She has a history of what is documented as previous stroke, though the clinical correlate is not clear. She states that she simply couldn't take her hand away from her face for a period of time. This was diagnosed as a TIA. She has never had any other similar episodes.  On MRI today, there is an ischemic infarct in the left parietal region, and I believe there is one in the right frontal region as well, though it is in an area prone to artifact. In any case, she has multiple ischemic infarcts involving the cortex in bilateral hemispheres and multiple vascular distributions.   LKW: Unclear tpa given?: no, unclear time of onset, no symptoms   ROS: A 14 point ROS was performed and is negative except as noted in the HPI.    Past Medical History:  Diagnosis Date  . A-V fistula ([place])    pt has 2  fistulas in rt arm  . Alopecia   . Anemia   . Anxiety   . Aortic stenosis   . Arthritis    gout  . Bladder cancer [facility])     s/p resection and BCG treatment.   Marland Kitchen CAD (coronary artery disease)     Pt is s/p anterior MI in 1996 with stent placed in LAD.  Last myoview in our office was in 7/05 and  showed apical anterior, apical, and apical inferior infarct.  Minimal peri-infarct ischemia.  EF 49%.   . Cardiomyopathy, ischemic   . CKD (chronic kidney disease)     Pt is stage III-IV.  She had a fistula placed but is not on dialysis.  She follows with Dr. Lorrene Reid for  nephrology.   Marland Kitchen COPD (chronic  obstructive pulmonary disease) ([place])   . CVA (cerebral vascular accident) ([place])   . DM (diabetes mellitus) ([place])   . Full dentures   . Gout   . History of glaucoma   . HOH (hard of hearing)    slightly  . HTN (hypertension)   . Hyperlipidemia   . Hypothyroidism   . Myocardial infarction ([place])   . PAD (peripheral artery disease) ([facility])     arterial doppler study 12/04 suggestive of > 50% bilateral SFA stenosis.  Pt has mild claudication.  No invasive evaluation given CKD and desire to avoid contrast use.   Marland Kitchen Shortness of breath dyspnea    on exertion  . Wears glasses      Family History  Problem Relation Age of Onset  . Breast cancer Mother   . Tuberculosis Father   . Heart disease Maternal Aunt      Social History:  reports that she quit smoking about 6 weeks ago. Her smoking use included Cigarettes. She has a 21.00 pack-year smoking history. She has never used smokeless tobacco. She reports that she drinks about 0.6 oz of alcohol per week . She reports that she does not use drugs.   Exam: Current vital signs: BP (!) 145/55   Pulse (!) 53   Temp 97.8 F (36.6 C) (Oral)   Resp Marland Kitchen)  21   Ht 5' 4.5" (1.638 m)   Wt 64.9 kg (143 lb)   SpO2 98%   BMI 24.17 kg/m  Vital signs in last 24 hours: Temp:  [97.8 F (36.6 C)] 97.8 F (36.6 C) (04/14 1709) Pulse Rate:  [51-58] 53 (04/14 1930) Resp:  [16-25] 21 (04/14 1930) BP: (139-153)/(55-62) 145/55 (04/14 1930) SpO2:  [98 %-100 %] 98 % (04/14 1930) Weight:  [64.9 kg (143 lb)] 64.9 kg (143 lb) (04/14 1709)   Physical Exam  Constitutional: Appears well-developed and well-nourished.  Psych: Affect appropriate to situation Eyes: No scleral injection HENT: No OP obstrucion Head: Normocephalic.  Cardiovascular: Normal rate and regular rhythm.  Respiratory: Effort normal and breath sounds normal to anterior ascultation GI: Soft.  No distension. There is no tenderness.  Skin: WDI  Neuro: Mental Status: Patient is awake,  alert, interactive and appropriate. Patient is able to give a clear and coherent history. No signs of aphasia or neglect Cranial Nerves: II: Visual Fields are full. Pupils are equal, round, and reactive to light.   III,IV, VI: EOMI without ptosis or diploplia.  V: Facial sensation is symmetric to temperature VII: Facial movement is symmetric.  VIII: hearing is intact to voice X: Uvula elevates symmetrically XI: Shoulder shrug is symmetric. XII: tongue is midline without atrophy or fasciculations.  Motor: Tone is normal. Bulk is normal. 5/5 strength was present in all four extremities.  Sensory: Sensation is symmetric to light touch and temperature in the arms and legs. Deep Tendon Reflexes: 2+ and symmetric in the biceps and patellae.  Plantars: Toes are downgoing bilaterally.  Cerebellar: FNF and HKS are intact bilaterally  I have reviewed labs in epic and the results pertinent to this consultation are: Elevated creatinine  I have reviewed the images obtained: MRI-left parietal infarct as read, on diffusion image 27 of the axials, I do think that there is  Impression: 83 year old female with multifocal infarcts in multiple vascular distributions. I suspect cardioembolic source, though hypercoagulable state due to cancer is also possible.  Recommendations: 1. HgbA1c, fasting lipid panel MRA  of the brain without contrast 3. Frequent neuro checks 4. Echocardiogram 5. Carotid dopplers 6. Prophylactic therapy-Antiplatelet med: Aspirin - dose '325mg'$  PO or '300mg'$  PR 7. Risk factor modification 8. Telemetry monitoring 9. PT consult, OT consult, Speech consult 10. please page stroke NP  Or  PA  Or MD  from 8am -4 pm as this patient will be followed by the stroke team at this point.   You can look them up on [email or web address]      Roland Rack, MD [facility] [phone]  If 7pm- 7am, please page neurology on call as listed in [place].

## 2016-08-16 NOTE — ED Triage Notes (Addendum)
Pt presents with c/o stroke. She had an outpatient MRI today which was ordered by her oncologist for evaluation of her cancer. The MRI showed acute nonhemorrhagic L temporal infarct. She was sent to ED For further evaluation. She denies any unilateral weakness, numbness, tingling, speech difficulty. She has had recent intermittent mild headaches, which she attributes to her seasonal allergies. I discussed this case with Dr Gilford Raid, orders given  for stroke lab panel at this time, pt will be moved to ER room for neurology consult

## 2016-08-16 NOTE — ED Notes (Signed)
Restrictions band placed on pt's right wrist.

## 2016-08-17 ENCOUNTER — Inpatient Hospital Stay (HOSPITAL_COMMUNITY): Payer: Medicare Other

## 2016-08-17 DIAGNOSIS — I10 Essential (primary) hypertension: Secondary | ICD-10-CM

## 2016-08-17 DIAGNOSIS — E1169 Type 2 diabetes mellitus with other specified complication: Secondary | ICD-10-CM

## 2016-08-17 DIAGNOSIS — E785 Hyperlipidemia, unspecified: Secondary | ICD-10-CM

## 2016-08-17 DIAGNOSIS — I6789 Other cerebrovascular disease: Secondary | ICD-10-CM

## 2016-08-17 LAB — GLUCOSE, CAPILLARY
GLUCOSE-CAPILLARY: 184 mg/dL — AB (ref 65–99)
Glucose-Capillary: 172 mg/dL — ABNORMAL HIGH (ref 65–99)
Glucose-Capillary: 175 mg/dL — ABNORMAL HIGH (ref 65–99)
Glucose-Capillary: 125 mg/dL — ABNORMAL HIGH (ref 65–99)

## 2016-08-17 LAB — RETICULOCYTES
RBC.: 3.34 MIL/uL — AB (ref 3.87–5.11)
RETIC COUNT ABSOLUTE: 50.1 10*3/uL (ref 19.0–186.0)
RETIC CT PCT: 1.5 % (ref 0.4–3.1)

## 2016-08-17 LAB — IRON AND TIBC
Iron: 34 ug/dL (ref 28–170)
Saturation Ratios: 16 % (ref 10.4–31.8)
TIBC: 211 ug/dL — ABNORMAL LOW (ref 250–450)
UIBC: 177 ug/dL

## 2016-08-17 LAB — FERRITIN: Ferritin: 279 ng/mL (ref 11–307)

## 2016-08-17 LAB — LIPID PANEL
CHOLESTEROL: 109 mg/dL (ref 0–200)
HDL: 42 mg/dL (ref >40)
LDL CALC: 48 mg/dL (ref 0–99)
TRIGLYCERIDES: 96 mg/dL (ref <150)
Total CHOL/HDL Ratio: 2.6 RATIO
VLDL: 19 mg/dL (ref 0–40)

## 2016-08-17 LAB — FOLATE: FOLATE: 79.8 ng/mL (ref >5.9)

## 2016-08-17 LAB — VITAMIN B12: VITAMIN B 12: 321 pg/mL (ref 180–914)

## 2016-08-17 MED ORDER — ASPIRIN 325 MG PO TABS
325.0000 mg | ORAL_TABLET | Freq: Every day | ORAL | Status: DC
  Administered 2016-08-17 – 2016-08-18 (×2): 325 mg via ORAL
  Filled 2016-08-17 (×2): qty 1

## 2016-08-17 NOTE — Evaluation (Signed)
Occupational Therapy Evaluation/Discharge Patient Details Name: Erica Jimenez MRN: [id number] DOB: Jun 04, 1929 Today's Date: 08/17/2016    History of Present Illness Pt is an 83 y.o. female with medical history significant of stage IV lung CA, aortic stenosis, anxiety, anemia, gout, history of bladder cancer status post resection followed by BCG, coronary heart disease status post stent placement, chronic kidney disease, COPD, diabetes mellitus, hypertension, dyslipidemia and TIA.  She was seen by oncology on April 12 for initiation of workup and MRI of the brain to help stage her squamous cell carcinoma of the lung.  MRI revealed ischemic stroke in the left parietal region.   Clinical Impression   PTA, pt was living with her daughter who is able to provide assistance with IADL and home management tasks. Pt able to demonstrate modified independence with ADL requiring increased time and compensatory strategies for energy conservation. Reinforced education concerning strategies to conserve energy due to decreased activity tolerance for ADL at baseline. Pt demonstrates understanding. Pt is functioning at baseline for ADL tasks at this time and has no further acute OT needs. Pt will have 24 hour assistance at home post-acute D/C. No OT follow-up recommended and OT will sign off.    Follow Up Recommendations  No OT follow up    Equipment Recommendations  None recommended by OT    Recommendations for Other Services       Precautions / Restrictions Precautions Precautions: None Restrictions Weight Bearing Restrictions: No      Mobility Bed Mobility Overal bed mobility: Independent                Transfers Overall transfer level: Modified independent Equipment used: None Transfers: Sit to/from Stand Sit to Stand: Modified independent (Device/Increase time)         General transfer comment: Increased time but no physical assistance.    Balance Overall balance assessment: No  apparent balance deficits (not formally assessed)                                         ADL either performed or assessed with clinical judgement   ADL Overall ADL's : Modified independent                                       General ADL Comments: Able to complete all with increased time and compensatory strategies for energy conservation.     Vision Patient Visual Report: No change from baseline Vision Assessment?: No apparent visual deficits Additional Comments: Able to use vision functionally throughout session.     Perception     Praxis Praxis Praxis tested?: Within functional limits    Pertinent Vitals/Pain Pain Assessment: No/denies pain     Hand Dominance     Extremity/Trunk Assessment Upper Extremity Assessment Upper Extremity Assessment: Generalized weakness   Lower Extremity Assessment Lower Extremity Assessment: Overall WFL for tasks assessed   Cervical / Trunk Assessment Cervical / Trunk Assessment: Kyphotic   Communication Communication Communication: No difficulties   Cognition Arousal/Alertness: Awake/alert Behavior During Therapy: WFL for tasks assessed/performed Overall Cognitive Status: Within Functional Limits for tasks assessed  General Comments       Exercises     Shoulder Instructions      Home Living Family/patient expects to be discharged to:: Private residence Living Arrangements: Children Available Help at Discharge: Family;Available 24 hours/day Type of Home: House Home Access: Ramped entrance     Home Layout: One level     Bathroom Shower/Tub: Teacher, early years/pre: Standard     Home Equipment: None          Prior Functioning/Environment Level of Independence: Independent        Comments: Ambulates household distances without AD. Independent with ADLs. Independent with light housekeeping.        OT Problem  List: Decreased strength;Decreased activity tolerance      OT Treatment/Interventions:      OT Goals(Current goals can be found in the care plan section) Acute Rehab OT Goals Patient Stated Goal: to go home OT Goal Formulation: With patient Time For Goal Achievement: 08/31/16 Potential to Achieve Goals: Good  OT Frequency:     Barriers to D/C:            Co-evaluation              End of Session Equipment Utilized During Treatment: Gait belt Nurse Communication: Mobility status  Activity Tolerance: Patient tolerated treatment well Patient left: in bed  OT Visit Diagnosis: Unsteadiness on feet (R26.81)                Time: 7001-7494 OT Time Calculation (min): 16 min Charges:  OT General Charges $OT Visit: 1 Procedure OT Evaluation $OT Eval Moderate Complexity: 1 Procedure G-Codes:     Norman Herrlich, MS OTR/L  Pager: [place] A Arbie Reisz 08/17/2016, 2:52 PM

## 2016-08-17 NOTE — Progress Notes (Signed)
STROKE TEAM PROGRESS NOTE   HISTORY OF PRESENT ILLNESS (per record) Erica Jimenez is a 83 y.o. female with a history of recently diagnosed squamous cell lung cancer presents with incidentally found ischemic strokes. She was undergoing brain MRI for routine screening and an incidentally found ischemic stroke in the left parietal region was discovered. She denies numbness, speech difficulty, weakness, visual change. She has a history of what is documented as previous stroke, though the clinical correlate is not clear. She states that she simply couldn't take her hand away from her face for a period of time. This was diagnosed as a TIA. She has never had any other similar episodes.  On MRI today, there is an ischemic infarct in the left parietal region, and I believe there is one in the right frontal region as well, though it is in an area prone to artifact. In any case, she has multiple ischemic infarcts involving the cortex in bilateral hemispheres and multiple vascular distributions.  LKW: Unclear tpa given?: no, unclear time of onset, no symptoms   SUBJECTIVE (INTERVAL HISTORY) Her daughter  is at the bedside.  She has no complaints  OBJECTIVE Temp:  [97.8 F (36.6 C)-98.6 F (37 C)] 98.6 F (37 C) (04/14 2309) Pulse Rate:  [51-130] 55 (04/15 0700) Cardiac Rhythm: Normal sinus rhythm (04/15 0822) Resp:  [16-27] 20 (04/14 2309) BP: (106-157)/(53-93) 107/89 (04/15 0700) SpO2:  [95 %-100 %] 97 % (04/15 0700) Weight:  [64.9 kg (143 lb)] 64.9 kg (143 lb) (04/14 2309)  CBC:   Recent Labs Lab 08/14/16 1302 08/16/16 1948  WBC 7.3 6.9  NEUTROABS 3.9 3.2  HGB 10.4* 9.8*  HCT 31.8* 30.7*  MCV 91.6 91.6  PLT 254 323    Basic Metabolic Panel:   Recent Labs Lab 08/14/16 1302 08/16/16 1948  NA 138 138  K 4.9 4.1  CL  --  105  CO2 24 25  GLUCOSE 208* 127*  BUN 56.0* 54*  CREATININE 2.3* 2.13*  CALCIUM 9.4 9.1    Lipid Panel:     Component Value Date/Time   CHOL 109  08/17/2016 0440   TRIG 96 08/17/2016 0440   HDL 42 08/17/2016 0440   CHOLHDL 2.6 08/17/2016 0440   VLDL 19 08/17/2016 0440   LDLCALC 48 08/17/2016 0440   HgbA1c: No results found for: HGBA1C Urine Drug Screen: No results found for: LABOPIA, COCAINSCRNUR, LABBENZ, AMPHETMU, THCU, LABBARB  Alcohol Level No results found for: [facility]  IMAGING  Dg Chest 2 View 08/17/2016 1. Multifocal neoplasm in the right upper lower lobes.  2. Aortic atherosclerosis.  3. No superimposed acute disease.    [name] Contrast 08/16/2016 1. Acute nonhemorrhagic 4 mm white matter infarct in the left temporal lobe.  2. Remote cortical infarcts bilaterally involving both parietal lobes, both occipital lobes, in the anterior left frontal lobe.  3. Remote white matter infarcts involving the left greater than right cerebellum.  4. Minimal left ethmoid sinus disease.     Mr Jodene Nam Head/brain Wo Cm 08/17/2016 1. No intracranial arterial occlusion or high-grade stenosis.  2. Mild atherosclerotic narrowing of both intracranial internal carotid arteries.      PHYSICAL EXAM Pleasant frail elderly african american lady not in distress. . Afebrile. Head is nontraumatic. Neck is supple without bruit.    Cardiac exam no murmur or gallop. Lungs are clear to auscultation. Distal pulses are well felt. Neurological Exam ;  Awake  Alert oriented x 3. Normal speech and language.eye movements full  without nystagmus.fundi were not visualized. Vision acuity and fields appear normal. Hearing is normal. Palatal movements are normal. Face symmetric. Tongue midline. Normal strength, tone, reflexes and coordination. Normal sensation. Gait deferred.      ASSESSMENT/PLAN Erica Jimenez is a 83 y.o. female with history of recently diagnosed squamous cell lung cancer, PAD, CAD, MI, hyperlipidemia, hypertension, diabetes mellitus, previous strokes, COPD, chronic kidney disease, cardiomyopathy, aortic stenosis and anxiety  presenting with incidental strokes found on MRI. She did not receive IV t-PA due to unknown time of onset.  Strokes:  Possibly  Silent left pareital subcortical infarct secondary to hypercoagulability from lung cancer.Remote age bilateral silent infarcts as well on MRI  Resultant   No deficits  MRI - Acute nonhemorrhagic 4 mm white matter infarct in the left temporal lobe.  MRA - No intracranial arterial occlusion or high-grade stenosis.   Carotid Doppler - 06/06/2016 stable 40-59% RICA stenosis. Stable 7-84% LICA stenosis. Patent vertebrals.  2D Echo - pending  LDL - 48  HgbA1c pending  VTE prophylaxis - SCDs (consider Lovenox due to hypercoagulability) Diet Carb Modified Fluid consistency: Thin; Room service appropriate? Yes  aspirin 81 mg daily and clopidogrel 75 mg daily prior to admission, now on aspirin 81 mg daily and clopidogrel 75 mg daily  Patient counseled to be compliant with her antithrombotic medications  Ongoing aggressive stroke risk factor management  Therapy recommendations: No follow up PT recommended. OT eval pending.  Disposition: Pending  Hypertension  Blood pressure runs low at times - 107/89 (currently off antihypertensives medications)  Permissive hypertension (OK if < 220/120) but gradually normalize in 5-7 days  Long-term BP goal normotensive  Hyperlipidemia  Home meds:  Lipitor 80 mg daily resumed in hospital  LDL 48, goal < 70  Continue statin at discharge  Diabetes  HgbA1c pending, goal < 7.0  Unc / Controlled   Other Stroke Risk Factors  Advanced age  The patient quit smoking approximately 2 months ago.  The patient drinks 0.6 ounces of alcohol per week.  Hx stroke/TIA  Coronary artery disease   Other Active Problems  Lung cancer  CKD - 54 / 2.13  Anemia - 9.8 / 30.7  Cardiomyopathy - echo results pending  Hospital day # 1  I have personally examined this patient, reviewed notes, independently viewed imaging  studies, participated in medical decision making and plan of care.ROS completed by me personally and pertinent positives fully documented  I have made any additions or clarifications directly to the above note. Recommend increase aspirin does to 325 mg. Check echocardiogram. No need to repeat carotid Dopplers as she had a normal study in February this year.long discussion with patient and family.Greater than 50% time during this 35 minute visit was spent on counseling and coordination of care about her strokes and discussion of treatment and prevention. Antony Contras, MD Medical Director [place] Pager: [phone] 08/17/2016 5:48 PM   To contact Stroke Continuity provider, please refer to [email or web address] After hours, contact General Neurology

## 2016-08-17 NOTE — Progress Notes (Signed)
PROGRESS NOTE    Erica Jimenez  ZOX:[id number] DOB: 24-Apr-1930 DOA: 08/16/2016 PCP: [name] (Inactive)   Brief Narrative: 83 y.o. female with medical history significant of stage IV lung CA, aortic stenosis, anxiety, anemia, gout, history of bladder cancer status post resection, coronary heart disease status post stent placement, chronic kidney disease, COPD, diabetes mellitus, hypertension, dyslipidemia and TIA, seen by oncologist on April 12 further initial workup and staging including MRI of the brain. The MRI of the brain consistent with acute stroke therefore patient was sent to the hospital.  Assessment & Plan:   # Acute left temporal lobe ischemic stroke: -MRI consistent with stroke -MRA no intracranial arterial occlusion or high-grade stenosis -Patient had a carotid Doppler ultrasound done on 06/06/2016 was consistent with right ICA 40-59% stenosis, left ICA 1-39 %, recommended 1 year follow-up. -Pending echocardiogram -PT, OT evaluation. Patient is able to ambulate and has no weakness in her extremities. -LDL 48 -A1c pending -Continue aspirin, Lipitor, Plavix -Follow-up neurologist plan  #thyroidism: Continue Synthroid  #Diabetes on chronic insulin: Continue current insulin regimen. Monitor blood sugar level  #Essential hypertension: Continue Imdur to monitor blood pressure.  Continue other home medications.  Active Problems:   Hyperlipidemia   Essential hypertension   CAD, NATIVE VESSEL   Aortic valve disorder   Lung cancer ([place])   Stage IV squamous cell carcinoma of right lung (HCC)   CVA (cerebral vascular accident) ([place])   Type 2 diabetes mellitus with hyperlipidemia ([place])   CKD (chronic kidney disease), stage III   Anemia  DVT prophylaxis: SCD. Patient is on aspirin and Plavix. Patient is ambulating as well Code Status: DNR/DNI Family Communication: No family at bedside Disposition Plan: Likely discharge home in 1-2 days    Consultants:    Neurologist  Procedures: MRI, echo, carotid Doppler Antimicrobials: None  Subjective: Patient was seen and examined at bedside. Patient was asymptomatic. Denied headache, dizziness, nausea, vomiting, chest pain, shortness of breath. Able to ambulate.  Objective: Vitals:   08/17/16 0400 08/17/16 0448 08/17/16 0449 08/17/16 0700  BP: 106/73   107/89  Pulse: 97 61 (!) 130 (!) 55  Resp:      Temp:      TempSrc:      SpO2: 96% 98% 97% 97%  Weight:      Height:        Intake/Output Summary (Last 24 hours) at 08/17/16 1154 Last data filed at 08/17/16 0447  Gross per 24 hour  Intake              175 ml  Output                0 ml  Net              175 ml   Filed Weights   08/16/16 1709 08/16/16 2309  Weight: 64.9 kg (143 lb) 64.9 kg (143 lb)    Examination:  General exam: Appears calm and comfortable  Respiratory system: Clear to auscultation. Respiratory effort normal. No wheezing or crackle Cardiovascular system: S1 & S2 heard, RRR.  No pedal edema. Gastrointestinal system: Abdomen is nondistended, soft and nontender. Normal bowel sounds heard. Central nervous system: Alert and oriented. No focal neurological deficits. Extremities: Symmetric 5 x 5 power. Skin: No rashes, lesions or ulcers Psychiatry: Judgement and insight appear normal. Mood & affect appropriate.     Data Reviewed: I have personally reviewed following labs and imaging studies  CBC:  Recent Labs Lab 08/14/16 1302 08/16/16  1948  WBC 7.3 6.9  NEUTROABS 3.9 3.2  HGB 10.4* 9.8*  HCT 31.8* 30.7*  MCV 91.6 91.6  PLT 254 737   Basic Metabolic Panel:  Recent Labs Lab 08/14/16 1302 08/16/16 1948  NA 138 138  K 4.9 4.1  CL  --  105  CO2 24 25  GLUCOSE 208* 127*  BUN 56.0* 54*  CREATININE 2.3* 2.13*  CALCIUM 9.4 9.1   GFR: Estimated Creatinine Clearance: 17 mL/min (A) (by C-G formula based on SCr of 2.13 mg/dL (H)). Liver Function Tests:  Recent Labs Lab 08/14/16 1302 08/16/16 1948   AST 22 24  ALT 13 16  ALKPHOS 76 72  BILITOT 0.39 0.4  PROT 7.4 7.7  ALBUMIN 3.2* 3.3*   No results for input(s): LIPASE, AMYLASE in the last 168 hours. No results for input(s): AMMONIA in the last 168 hours. Coagulation Profile:  Recent Labs Lab 08/16/16 1948  INR 1.02   Cardiac Enzymes: No results for input(s): CKTOTAL, CKMB, CKMBINDEX, TROPONINI in the last 168 hours. BNP (last 3 results) No results for input(s): PROBNP in the last 8760 hours. HbA1C: No results for input(s): HGBA1C in the last 72 hours. CBG:  Recent Labs Lab 08/16/16 2323 08/17/16 0831  GLUCAP 87 184*   Lipid Profile:  Recent Labs  08/17/16 0440  CHOL 109  HDL 42  LDLCALC 48  TRIG 96  CHOLHDL 2.6   Thyroid Function Tests: No results for input(s): TSH, T4TOTAL, FREET4, T3FREE, THYROIDAB in the last 72 hours. Anemia Panel:  Recent Labs  08/17/16 0440  VITAMINB12 321  FOLATE 79.8  FERRITIN 279  TIBC 211*  IRON 34  RETICCTPCT 1.5   Sepsis Labs: No results for input(s): PROCALCITON, LATICACIDVEN in the last 168 hours.  No results found for this or any previous visit (from the past 240 hour(s)).       Radiology Studies: Dg Chest 2 View  Result Date: 08/17/2016 CLINICAL DATA:  Stage IV lung cancer. Acute infarct of the left temporal lobe. EXAM: CHEST  2 VIEW COMPARISON:  CT of the chest 07/30/2016. FINDINGS: The heart is mildly enlarged. Atherosclerotic calcifications are present at the aortic arch. Right lower lobe and upper lobe opacities compatible with known mass lesions. Mild chronic interstitial coarsening is present as well. Bibasilar atelectasis is present. There are no significant effusions. The visualized soft tissues and bony thorax are unremarkable. IMPRESSION: 1. Multifocal neoplasm in the right upper lower lobes. 2. Aortic atherosclerosis. 3. No superimposed acute disease. Electronically Signed   By: San Morelle M.D.   On: 08/17/2016 08:05   [name]  Contrast  Result Date: 08/16/2016 CLINICAL DATA:  Stage IV squamous cell carcinoma of the Lung EXAM: MRI HEAD WITHOUT CONTRAST TECHNIQUE: Multiplanar, multiecho pulse sequences of the brain and surrounding structures were obtained without intravenous contrast. COMPARISON:  CT head without contrast 05/11/2010. FINDINGS: Brain: A 4 mm acute nonhemorrhagic white matter infarct is present in the left temporal lobe adjacent to the left lateral ventricle. No other acute infarcts are present. Remote bilateral parietal lobe infarcts are present. Advanced atrophy and white matter disease is again seen bilaterally. Remote occipital lobe infarcts are present. A remote infarct is present in the anterior left frontal lobe. No focal mass lesion is present. The ventricles are proportionate to the degree of atrophy with areas of ex vacuo dilation. White matter changes extend into the brainstem. Left greater than right remote cerebellar infarcts are present. No acute hemorrhage is present. No significant extraaxial  fluid collection is present. Vascular: Flow is present in the major intracranial arteries. Skull and upper cervical spine: The skullbase is within normal limits. The craniocervical junction is normal. Midline sagittal structures are within normal limits. The upper cervical spine is unremarkable. Marrow signal is normal. Sinuses/Orbits: Mid left ethmoid air cells are opacified. The remaining paranasal sinuses and mastoid air cells are clear. Bilateral lens replacements are present. The globes and orbits are otherwise within normal limits. IMPRESSION: 1. Acute nonhemorrhagic 4 mm white matter infarct in the left temporal lobe. 2. Remote cortical infarcts bilaterally involving both parietal lobes, both occipital lobes, in the anterior left frontal lobe. 3. Remote white matter infarcts involving the left greater than right cerebellum. 4. Minimal left ethmoid sinus disease. These results were called by telephone at the time  of interpretation on 08/16/2016 at 4:37 pm to Dr. Curt Bears , who verbally acknowledged these results. Dr. [name] asked that the patient be directed to the [facility] Emergency Department for further evaluation. The MRI technologist communicated this to the patient who agreed to proceed to the [facility] Emergency Department. Electronically Signed   By: San Morelle M.D.   On: 08/16/2016 16:39   Mr Jodene Nam Head/brain FG Cm  Result Date: 08/17/2016 CLINICAL DATA:  TIA.  History of lung cancer. EXAM: MRA HEAD WITHOUT CONTRAST TECHNIQUE: Angiographic images of the Circle of Willis were obtained using MRA technique without intravenous contrast. COMPARISON:  Brain MRI 08/16/2016 FINDINGS: Intracranial internal carotid arteries: There is mild irregular narrowing of the right internal carotid artery lacerum and cavernous segments. There is approximately 50% narrowing of the left internal carotid artery lacerum segment (series 3 image 87 Anterior cerebral arteries: Normal. Middle cerebral arteries: Normal. Posterior communicating arteries: Absent bilaterally. Posterior cerebral arteries: Normal. Basilar artery: Normal. Vertebral arteries: Left dominant. Normal. Superior cerebellar arteries: Normal. Anterior inferior cerebellar arteries: Normal. Posterior inferior cerebellar arteries: Normal. IMPRESSION: 1. No intracranial arterial occlusion or high-grade stenosis. 2. Mild atherosclerotic narrowing of both intracranial internal carotid arteries. Electronically Signed   By: Ulyses Jarred M.D.   On: 08/17/2016 06:30        Scheduled Meds: . ALPRAZolam  0.25 mg Oral QHS  . aspirin EC  81 mg Oral QPC supper  . atorvastatin  80 mg Oral QPC supper  . brinzolamide  1 drop Both Eyes BID  . [START ON 08/18/2016] calcitRIOL  0.25 mcg Oral Q M,W,F  . citalopram  20 mg Oral QPC supper  . clopidogrel  75 mg Oral QPC supper  . febuxostat  40 mg Oral Daily  . folic acid  1 mg Oral Daily  . insulin aspart  0-5  Units Subcutaneous QHS  . insulin aspart  0-9 Units Subcutaneous TID WC  . insulin glargine  12 Units Subcutaneous Daily  . isosorbide mononitrate  60 mg Oral QPC supper  . latanoprost  1 drop Both Eyes QHS  . levothyroxine  50 mcg Oral QPC supper  . mirtazapine  15 mg Oral QHS   Continuous Infusions: . sodium chloride Stopped (08/17/16 0500)     LOS: 1 day    Dron Tanna Furry, MD [facility] Pager [phone]  If 7PM-7AM, please contact night-coverage [email or web address] Password [facility] 08/17/2016, 11:54 AM

## 2016-08-17 NOTE — Progress Notes (Signed)
Troponin canceled per Dr Roel Cluck

## 2016-08-17 NOTE — Progress Notes (Signed)
  Echocardiogram 2D Echocardiogram has been performed.  Johny Chess 08/17/2016, 5:15 PM

## 2016-08-17 NOTE — Evaluation (Signed)
Physical Therapy Evaluation Patient Details Name: Erica Jimenez MRN: [id number] DOB: [birth year] Today's Date: 08/17/2016   History of Present Illness  Pt is an 83 y.o. female with medical history significant of stage IV lung CA, aortic stenosis, anxiety, anemia, gout, history of bladder cancer status post resection followed by BCG, coronary heart disease status post stent placement, chronic kidney disease, COPD, diabetes mellitus, hypertension, dyslipidemia and TIA.  She was seen by oncology on April 12 for initiation of workup and MRI of the brain to help stage her squamous cell carcinoma of the lung.  MRI revealed ischemic stroke in the left parietal region.  Clinical Impression  PT eval complete. Pt is at baseline for functional mobility. See below for details. No further PT intervention indicated. PT signing off.    Follow Up Recommendations No PT follow up    Equipment Recommendations  None recommended by PT    Recommendations for Other Services       Precautions / Restrictions Precautions Precautions: None      Mobility  Bed Mobility Overal bed mobility: Independent                Transfers Overall transfer level: Independent Equipment used: None                Ambulation/Gait Ambulation/Gait assistance: Supervision Ambulation Distance (Feet): 200 Feet Assistive device: None Gait Pattern/deviations: Step-through pattern;Decreased stride length Gait velocity: decreased   General Gait Details: slow, steady gait. 2 standing rest breaks needed for fatigue/SOB. SpO2 93% during mobility.  Stairs            Wheelchair Mobility    Modified Rankin (Stroke Patients Only) Modified Rankin (Stroke Patients Only) Pre-Morbid Rankin Score: No symptoms Modified Rankin: No significant disability     Balance Overall balance assessment: No apparent balance deficits (not formally assessed)                                            Pertinent Vitals/Pain Pain Assessment: No/denies pain    Home Living Family/patient expects to be discharged to:: Private residence Living Arrangements: Children Available Help at Discharge: Family;Available 24 hours/day Type of Home: House Home Access: Ramped entrance     Home Layout: One level Home Equipment: None      Prior Function Level of Independence: Independent         Comments: Ambulates household distances without AD. Independent with ADLs. Independent with light housekeeping.     Hand Dominance        Extremity/Trunk Assessment   Upper Extremity Assessment Upper Extremity Assessment: Defer to OT evaluation    Lower Extremity Assessment Lower Extremity Assessment: Overall WFL for tasks assessed (appears symmetrical)    Cervical / Trunk Assessment Cervical / Trunk Assessment: Kyphotic  Communication   Communication: No difficulties  Cognition Arousal/Alertness: Awake/alert Behavior During Therapy: WFL for tasks assessed/performed Overall Cognitive Status: Within Functional Limits for tasks assessed                                        General Comments      Exercises     Assessment/Plan    PT Assessment Patent does not need any further PT services  PT Problem List         PT  Treatment Interventions      PT Goals (Current goals can be found in the Care Plan section)  Acute Rehab PT Goals Patient Stated Goal: home PT Goal Formulation: All assessment and education complete, DC therapy    Frequency     Barriers to discharge        Co-evaluation               End of Session Equipment Utilized During Treatment: Gait belt Activity Tolerance: Patient tolerated treatment well Patient left: in bed;Other (comment);with call bell/phone within reach (MD in room) Nurse Communication: Mobility status PT Visit Diagnosis: Other abnormalities of gait and mobility (R26.89)    Time: 3007-6226 PT Time Calculation  (min) (ACUTE ONLY): 16 min   Charges:   PT Evaluation $PT Eval Moderate Complexity: 1 Procedure     PT G Codes:        Lorrin Goodell, PT  Office # [phone] Pager [phone]   Erica Jimenez 08/17/2016, 9:25 AM

## 2016-08-17 NOTE — Progress Notes (Signed)
Patient transporting for MRI and CXR. CCMD notified.

## 2016-08-18 ENCOUNTER — Telehealth: Payer: Self-pay | Admitting: Medical Oncology

## 2016-08-18 ENCOUNTER — Other Ambulatory Visit: Payer: Self-pay | Admitting: Cardiology

## 2016-08-18 DIAGNOSIS — N183 Chronic kidney disease, stage 3 (moderate): Secondary | ICD-10-CM

## 2016-08-18 LAB — ECHOCARDIOGRAM COMPLETE
AOPV: 0.33 m/s
AV Area mean vel: 0.78 cm2
AV peak Index: 0.45
AV pk vel: 224 cm/s
AVAREAMEANVIN: 0.46 cm2/m2
AVAREAVTI: 0.76 cm2
AVAREAVTIIND: 0.39 cm2/m2
AVCELMEANRAT: 0.35
AVG: 11 mmHg
AVPG: 20 mmHg
CHL CUP AV VALUE AREA INDEX: 0.39
CHL CUP AV VEL: 0.66
CHL CUP MV DEC (S): 237
CHL CUP STROKE VOLUME: 42 mL
DOP CAL AO MEAN VELOCITY: 153 cm/s
E decel time: 237 msec
E/e' ratio: 22.25
FS: 17 % — AB (ref 28–44)
HEIGHTINCHES: 64 in
IV/PV OW: 0.92
LA ID, A-P, ES: 33 mm
LA diam index: 1.94 cm/m2
LA vol A4C: 57.2 ml
LAVOL: 62.3 mL
LAVOLIN: 36.6 mL/m2
LDCA: 2.27 cm2
LEFT ATRIUM END SYS DIAM: 33 mm
LV E/e' medial: 22.25
LV E/e'average: 22.25
LV PW d: 7.85 mm — AB (ref 0.6–1.1)
LV SIMPSON'S DISK: 43
LV dias vol index: 57 mL/m2
LV dias vol: 97 mL (ref 46–106)
LV e' LATERAL: 4.81 cm/s
LV sys vol: 56 mL — AB (ref 14–42)
LVOT SV: 45 mL
LVOT VTI: 19.7 cm
LVOT peak VTI: 0.29 cm
LVOTD: 17 mm
LVOTPV: 75 cm/s
LVSYSVOLIN: 33 mL/m2
Lateral S' vel: 11.5 cm/s
MVPG: 5 mmHg
MVPKAVEL: 117 m/s
MVPKEVEL: 107 m/s
TAPSE: 23 mm
TDI e' lateral: 4.81
TDI e' medial: 4.19
VTI: 68.1 cm
Valve area: 0.66 cm2
Weight: 2288 oz

## 2016-08-18 LAB — HEMOGLOBIN A1C
HEMOGLOBIN A1C: 7.6 % — AB (ref 4.8–5.6)
Mean Plasma Glucose: 171 mg/dL

## 2016-08-18 LAB — GLUCOSE, CAPILLARY
GLUCOSE-CAPILLARY: 141 mg/dL — AB (ref 65–99)
GLUCOSE-CAPILLARY: 176 mg/dL — AB (ref 65–99)

## 2016-08-18 LAB — PREALBUMIN: Prealbumin: 19.4 mg/dL (ref 18–38)

## 2016-08-18 MED ORDER — ASPIRIN 325 MG PO TABS: 325.0000 mg | ORAL_TABLET | Freq: Every day | ORAL | 0 refills | Status: DC

## 2016-08-18 NOTE — Telephone Encounter (Signed)
Yes. Proceed with treatment as planned.

## 2016-08-18 NOTE — Care Management Note (Signed)
Case Management Note  Patient Details  Name: TAWONA FILSINGER MRN: [id number] Date of Birth: 20-Apr-1930  Subjective/Objective:    Pt admitted with CVA. She is from home with family.                 Action/Plan: Pt discharging home with family. Pt has insurance and PCP. No f/u per PT/OT. No further needs per CM.   Expected Discharge Date:  08/18/16               Expected Discharge Plan:  Home/Self Care  In-House Referral:     Discharge planning Services     Post Acute Care Choice:    Choice offered to:     DME Arranged:    DME Agency:     HH Arranged:    HH Agency:     Status of Service:  Completed, signed off  If discussed at [employer] of Stay Meetings, dates discussed:    Additional Comments:  Pollie Friar, RN 08/18/2016, 10:45 AM

## 2016-08-18 NOTE — Progress Notes (Signed)
STROKE TEAM PROGRESS NOTE   HISTORY OF PRESENT ILLNESS (per record) Erica Jimenez is a 83 y.o. female with a history of recently diagnosed squamous cell lung cancer presents with incidentally found ischemic strokes. She was undergoing brain MRI for routine screening and an incidentally found ischemic stroke in the left parietal region was discovered. She denies numbness, speech difficulty, weakness, visual change. She has a history of what is documented as previous stroke, though the clinical correlate is not clear. She states that she simply couldn't take her hand away from her face for a period of time. This was diagnosed as a TIA. She has never had any other similar episodes.  On MRI today, there is an ischemic infarct in the left parietal region, and I believe there is one in the right frontal region as well, though it is in an area prone to artifact. In any case, she has multiple ischemic infarcts involving the cortex in bilateral hemispheres and multiple vascular distributions.  LKW: Unclear tpa given?: no, unclear time of onset, no symptoms   SUBJECTIVE (INTERVAL HISTORY) Her daughter  is at the bedside.  She has no complaints  OBJECTIVE Temp:  [98.3 F (36.8 C)-99 F (37.2 C)] 99 F (37.2 C) (04/16 0907) Pulse Rate:  [56-61] 61 (04/16 0907) Cardiac Rhythm: Normal sinus rhythm;Bundle branch block (04/15 2020) Resp:  [18-20] 18 (04/16 0907) BP: (119-134)/(47-60) 134/56 (04/16 0907) SpO2:  [96 %-100 %] 98 % (04/16 0907)  CBC:   Recent Labs Lab 08/14/16 1302 08/16/16 1948  WBC 7.3 6.9  NEUTROABS 3.9 3.2  HGB 10.4* 9.8*  HCT 31.8* 30.7*  MCV 91.6 91.6  PLT 254 425    Basic Metabolic Panel:   Recent Labs Lab 08/14/16 1302 08/16/16 1948  NA 138 138  K 4.9 4.1  CL  --  105  CO2 24 25  GLUCOSE 208* 127*  BUN 56.0* 54*  CREATININE 2.3* 2.13*  CALCIUM 9.4 9.1    Lipid Panel:     Component Value Date/Time   CHOL 109 08/17/2016 0440   TRIG 96 08/17/2016 0440    HDL 42 08/17/2016 0440   CHOLHDL 2.6 08/17/2016 0440   VLDL 19 08/17/2016 0440   LDLCALC 48 08/17/2016 0440   HgbA1c:  Lab Results  Component Value Date   HGBA1C 7.6 (H) 08/17/2016   Urine Drug Screen: No results found for: LABOPIA, COCAINSCRNUR, LABBENZ, AMPHETMU, THCU, LABBARB  Alcohol Level No results found for: [facility]  IMAGING  Dg Chest 2 View 08/17/2016 1. Multifocal neoplasm in the right upper lower lobes.  2. Aortic atherosclerosis.  3. No superimposed acute disease.    [name] Contrast 08/16/2016 1. Acute nonhemorrhagic 4 mm white matter infarct in the left temporal lobe.  2. Remote cortical infarcts bilaterally involving both parietal lobes, both occipital lobes, in the anterior left frontal lobe.  3. Remote white matter infarcts involving the left greater than right cerebellum.  4. Minimal left ethmoid sinus disease.     Mr Jodene Nam Head/brain Wo Cm 08/17/2016 1. No intracranial arterial occlusion or high-grade stenosis.  2. Mild atherosclerotic narrowing of both intracranial internal carotid arteries.      PHYSICAL EXAM Pleasant frail elderly african american lady not in distress. . Afebrile. Head is nontraumatic. Neck is supple without bruit.    Cardiac exam no murmur or gallop. Lungs are clear to auscultation. Distal pulses are well felt. Neurological Exam ;  Awake  Alert oriented x 3. Normal speech and language.eye movements full  without nystagmus.fundi were not visualized. Vision acuity and fields appear normal. Hearing is normal. Palatal movements are normal. Face symmetric. Tongue midline. Normal strength, tone, reflexes and coordination. Normal sensation. Gait deferred.      ASSESSMENT/PLAN Erica Jimenez is a 83 y.o. female with history of recently diagnosed squamous cell lung cancer, PAD, CAD, MI, hyperlipidemia, hypertension, diabetes mellitus, previous strokes, COPD, chronic kidney disease, cardiomyopathy, aortic stenosis and anxiety presenting with  incidental strokes found on MRI. She did not receive IV t-PA due to unknown time of onset.  Strokes:  Possibly  Silent left pareital subcortical infarct secondary to hypercoagulability from lung cancer.Remote age bilateral silent infarcts as well on MRI  Resultant   No deficits  MRI - Acute nonhemorrhagic 4 mm white matter infarct in the left temporal lobe.  MRA - No intracranial arterial occlusion or high-grade stenosis.   Carotid Doppler - 06/06/2016 stable 40-59% RICA stenosis. Stable 6-28% LICA stenosis. Patent vertebrals. 2D Echo - Left ventricle: There was focal basal hypertrophy. Systolic   function was mildly reduced. The estimated ejection fraction was   in the range of 45% to 50%. Akinesis of the apicalanteroseptal    and apical myocardium  LDL - 48  HgbA1c 7.6  VTE prophylaxis - SCDs (consider Lovenox due to hypercoagulability) Diet Carb Modified Fluid consistency: Thin; Room service appropriate? Yes Diet - low sodium heart healthy Diet Carb Modified  aspirin 81 mg daily and clopidogrel 75 mg daily prior to admission, now on aspirin 81 mg daily and clopidogrel 75 mg daily  Patient counseled to be compliant with her antithrombotic medications  Ongoing aggressive stroke risk factor management  Therapy recommendations: No follow up PT recommended. OT eval pending. Disposition: homeHypertension  Blood pressure runs low at times - 107/89 (currently off antihypertensives medications)  Permissive hypertension (OK if < 220/120) but gradually normalize in 5-7 days  Long-term BP goal normotensive  Hyperlipidemia  Home meds:  Lipitor 80 mg daily resumed in hospital  LDL 48, goal < 70  Continue statin at discharge   Diabetes  HgbA1c pending, goal < 7.0  Unc / Controlled   Other Stroke Risk Factors  Advanced age  The patient quit smoking approximately 2 months ago.  The patient drinks 0.6 ounces of alcohol per week.  Hx stroke/TIA  Coronary artery  disease   Other Active Problems  Lung cancer  CKD - 54 / 2.13  Anemia - 9.8 / 30.7  Cardiomyopathy - echo results pending  Hospital day # 2   Agree DC home on aspirin 325 mg daily. F/U as outpatient in stroke clinic 6 weeks Antony Contras, MD Medical Director [facility] Pager: [phone] 08/18/2016 3:41 PM   To contact Stroke Continuity provider, please refer to [email or web address] After hours, contact General Neurology

## 2016-08-18 NOTE — Progress Notes (Signed)
Patient is discharged from room 5M18 at this time. Alert and in stable condition. IV site d/c'd as well as tele. Instructions read to patient with understanding verbalized. Left unit via wheelchair with family and all belongings at side.

## 2016-08-18 NOTE — Discharge Summary (Addendum)
Physician Discharge Summary  MAYDELL KNOEBEL YIR:[id number] DOB: [birth year] DOA: 08/16/2016  PCP: [name] (Inactive)  Admit date: 08/16/2016 Discharge date: 08/18/2016  Admitted From:home Disposition:home  Recommendations for Outpatient Follow-up:  1. Follow up with PCP in 1-2 weeks 2. Please obtain BMP/CBC in one week  Home Health:no Equipment/Devices:no Discharge Condition:stable CODE STATUS:dnr Diet recommendation:carb modified heart heathy diet  Brief/Interim Summary: 83 y.o.femalewith medical history significant of stage IV lung CA, aortic stenosis, anxiety, anemia, gout, history of bladder cancer status post resection, coronary heart disease status post stent placement, chronic kidney disease, COPD, diabetes mellitus, hypertension, dyslipidemia and TIA, seen by oncologist on April 12 further initial workup and staging including MRI of the brain. The MRI of the brain consistent with acute stroke therefore patient was sent to the hospital.  # Acute left temporal lobe ischemic stroke: -MRI consistent with stroke -MRA no intracranial arterial occlusion or high-grade stenosis -Patient had a carotid Doppler ultrasound done on 06/06/2016 was consistent with right ICA 40-59% stenosis, left ICA 1-39 %, recommended 1 year follow-up. On aspirin and plavix. -Echo with EF 45-50 % with mildly reduced systolic function and diastolic dysfunction, no significant changed from previous echo in 2015. -PT, OT evaluation. Patient is able to ambulate and has no weakness in her extremities. -LDL 48 -A1c 7.6, discussed with the patient regarding better control of blood sugar level.  -increased the dose of aspirin to 325, continue plavix and statin -outpatient referral to neurologist.  #thyroidism: Continue Synthroid  #Diabetes on chronic insulin: Continue current insulin regimen. Monitor blood sugar level. Education provided to the patient regarding blood sugar monitoring. She is currently on  insulin. Recommended to follow up with PCP.  #Essential hypertension: Discontinued Norvasc. Patient is on other cardiac medications. Educated patient regarding monitoring of blood pressure and to avoid hypotension. Patient will follow-up with PCP.  Discharge Diagnoses:  Active Problems:   Hyperlipidemia   Essential hypertension   CAD, NATIVE VESSEL   Aortic valve disorder   Lung cancer (HCC)   Stage IV squamous cell carcinoma of right lung (HCC)   Acute cerebrovascular accident (CVA) ([place])   Type 2 diabetes mellitus with hyperlipidemia (HCC)   CKD (chronic kidney disease), stage III   Anemia    Discharge Instructions  Discharge Instructions    Ambulatory referral to Neurology    Complete by:  As directed    An appointment is requested in approximately: 4 weeks   Call MD for:  difficulty breathing, headache or visual disturbances    Complete by:  As directed    Call MD for:  extreme fatigue    Complete by:  As directed    Call MD for:  hives    Complete by:  As directed    Call MD for:  persistant dizziness or light-headedness    Complete by:  As directed    Call MD for:  persistant nausea and vomiting    Complete by:  As directed    Call MD for:  severe uncontrolled pain    Complete by:  As directed    Call MD for:  temperature >100.4    Complete by:  As directed    Diet - low sodium heart healthy    Complete by:  As directed    Diet Carb Modified    Complete by:  As directed    Discharge instructions    Complete by:  As directed    Please monitor BP, avoid low BP. May need to hold  hydralazine, imdur or lasix if your systolic BP is <811. Pleased discuss with your PCP.   Increase activity slowly    Complete by:  As directed      Allergies as of 08/18/2016      Reactions   Contrast Media [iodinated Diagnostic Agents] Other (See Comments)   Renal insufficiency   Fluoxetine Rash   Sulfonamide Derivatives Nausea Only      Medication List    STOP taking these  medications   amLODipine 5 MG tablet Commonly known as:  NORVASC   aspirin 81 MG EC tablet Replaced by:  aspirin 325 MG tablet     TAKE these medications   ALPRAZolam 0.25 MG tablet Commonly known as:  XANAX Take 0.25 mg by mouth at bedtime.   aspirin 325 MG tablet Take 1 tablet (325 mg total) by mouth daily. Start taking on:  08/19/2016 Replaces:  aspirin 81 MG EC tablet   atorvastatin 80 MG tablet Commonly known as:  LIPITOR Take 80 mg by mouth daily after supper.   bisoprolol 5 MG tablet Commonly known as:  ZEBETA Take 0.5 tablets (2.5 mg total) by mouth daily. *Please keep upcoming appointment for further refills* What changed:  when to take this  additional instructions   brinzolamide 1 % ophthalmic suspension Commonly known as:  AZOPT Place 1 drop into both eyes 2 (two) times daily.   calcitRIOL 0.25 MCG capsule Commonly known as:  ROCALTROL Take 0.25 mcg by mouth every Monday, Wednesday, and Friday.   citalopram 20 MG tablet Commonly known as:  CELEXA Take 20 mg by mouth daily after supper.   clopidogrel 75 MG tablet Commonly known as:  PLAVIX Take 75 mg by mouth daily after supper.   febuxostat 40 MG tablet Commonly known as:  ULORIC Take 40 mg by mouth daily.   FERGON PO Take 1 tablet by mouth daily after supper.   folic acid 1 MG tablet Commonly known as:  FOLVITE Take 1 mg by mouth daily.   furosemide 40 MG tablet Commonly known as:  LASIX Take 40 mg by mouth daily.   HUMALOG KWIKPEN 100 UNIT/ML KiwkPen Generic drug:  insulin lispro Inject 2-3 Units into the skin 3 (three) times daily before meals.   hydrALAZINE 50 MG tablet Commonly known as:  APRESOLINE Take 1 tablet (50 mg total) by mouth 2 (two) times daily.   insulin glargine 100 unit/mL Sopn Commonly known as:  LANTUS Inject 12 Units into the skin daily before breakfast.   isosorbide mononitrate 60 MG 24 hr tablet Commonly known as:  IMDUR Take 60 mg by mouth daily after  supper.   latanoprost 0.005 % ophthalmic solution Commonly known as:  XALATAN Place 1 drop into both eyes at bedtime.   levothyroxine 50 MCG tablet Commonly known as:  SYNTHROID, LEVOTHROID Take 50 mcg by mouth daily after supper.   lidocaine-prilocaine cream Commonly known as:  EMLA Apply 1 application topically as needed. What changed:  when to take this  reasons to take this   mirtazapine 30 MG tablet Commonly known as:  REMERON Take 15 mg by mouth at bedtime.   MUCINEX SINUS-MAX CONGESTION 10-650-400 MG/20ML Liqd Generic drug:  Phenylephrine-APAP-Guaifenesin Take 20 mLs by mouth 2 (two) times daily as needed (scratchy throat). Per bottle as needed   nitroGLYCERIN 0.4 MG SL tablet Commonly known as:  NITROSTAT Place 1 tablet (0.4 mg total) under the tongue every 5 (five) minutes as needed for chest pain.   prochlorperazine 10 MG  tablet Commonly known as:  COMPAZINE Take 1 tablet (10 mg total) by mouth every 6 (six) hours as needed for nausea or vomiting.   REFRESH TEARS OP Place 1 drop into both eyes 3 (three) times daily as needed (dry eyes). Both eyes.   senna 8.6 MG tablet Commonly known as:  SENOKOT Take 1 tablet by mouth daily as needed for constipation.      Follow-up Information    PATERSON, DANIEL. Schedule an appointment as soon as possible for a visit in 1 week(s).   Specialty:  Surgery Contact information: [place]        Eilleen Kempf., MD Follow up.   Specialty:  Oncology Why:  resumed your previous appointments Contact information: [address] [place] [address] [phone]          Allergies  Allergen Reactions  . Contrast Media [Iodinated Diagnostic Agents] Other (See Comments)    Renal insufficiency  . Fluoxetine Rash  . Sulfonamide Derivatives Nausea Only    Consultations: Neurologist  Procedures/Studies: MRI, echo  Subjective: Patient was seen and examined at bedside. She reported well. He is eager to  go home today. Denied headache, dizziness, nausea, vomiting, chest pain or shortness of breath. Able to ambulate without difficulties.  Discharge Exam: Vitals:   08/18/16 0447 08/18/16 0907  BP: 128/60 (!) 134/56  Pulse: (!) 56 61  Resp: 18 18  Temp: 98.3 F (36.8 C) 99 F (37.2 C)   Vitals:   08/17/16 2131 08/18/16 0131 08/18/16 0447 08/18/16 0907  BP: (!) 130/47 (!) 125/55 128/60 (!) 134/56  Pulse: (!) 59 61 (!) 56 61  Resp: '18 18 18 18  '$ Temp: 98.8 F (37.1 C) 98.6 F (37 C) 98.3 F (36.8 C) 99 F (37.2 C)  TempSrc: Oral Oral Oral Oral  SpO2: 97% 96% 100% 98%  Weight:      Height:        General: Pt is alert, awake, not in acute distress Cardiovascular: RRR, S1/S2 +, no rubs, no gallops Respiratory: CTA bilaterally, no wheezing, no rhonchi Abdominal: Soft, NT, ND, bowel sounds + Extremities: no edema, no cyanosis Neurologist: Alert, awake, oriented 3, muscle strength 5 over 5 in all extremities. No focal neurological deficit.   The results of significant diagnostics from this hospitalization (including imaging, microbiology, ancillary and laboratory) are listed below for reference.     Microbiology: No results found for this or any previous visit (from the past 240 hour(s)).   Labs: BNP (last 3 results) No results for input(s): BNP in the last 8760 hours. Basic Metabolic Panel:  Recent Labs Lab 08/14/16 1302 08/16/16 1948  NA 138 138  K 4.9 4.1  CL  --  105  CO2 24 25  GLUCOSE 208* 127*  BUN 56.0* 54*  CREATININE 2.3* 2.13*  CALCIUM 9.4 9.1   Liver Function Tests:  Recent Labs Lab 08/14/16 1302 08/16/16 1948  AST 22 24  ALT 13 16  ALKPHOS 76 72  BILITOT 0.39 0.4  PROT 7.4 7.7  ALBUMIN 3.2* 3.3*   No results for input(s): LIPASE, AMYLASE in the last 168 hours. No results for input(s): AMMONIA in the last 168 hours. CBC:  Recent Labs Lab 08/14/16 1302 08/16/16 1948  WBC 7.3 6.9  NEUTROABS 3.9 3.2  HGB 10.4* 9.8*  HCT 31.8* 30.7*   MCV 91.6 91.6  PLT 254 248   Cardiac Enzymes: No results for input(s): CKTOTAL, CKMB, CKMBINDEX, TROPONINI in the last 168 hours. BNP: Invalid input(s): POCBNP  CBG:  Recent Labs Lab 08/17/16 0831 08/17/16 1201 08/17/16 1649 08/17/16 2135 08/18/16 0605  GLUCAP 184* 172* 175* 125* 141*   D-Dimer No results for input(s): DDIMER in the last 72 hours. Hgb A1c  Recent Labs  08/17/16 0440  HGBA1C 7.6*   Lipid Profile  Recent Labs  08/17/16 0440  CHOL 109  HDL 42  LDLCALC 48  TRIG 96  CHOLHDL 2.6   Thyroid function studies No results for input(s): TSH, T4TOTAL, T3FREE, THYROIDAB in the last 72 hours.  Invalid input(s): FREET3 Anemia work up  Recent Labs  08/17/16 0440  VITAMINB12 321  FOLATE 79.8  FERRITIN 279  TIBC 211*  IRON 34  RETICCTPCT 1.5   Urinalysis    Component Value Date/Time   COLORURINE YELLOW 05/11/2010 0004   APPEARANCEUR CLEAR 05/11/2010 0004   LABSPEC 1.011 05/11/2010 0004   PHURINE 5.5 05/11/2010 0004   HGBUR NEGATIVE 05/11/2010 0004   BILIRUBINUR NEGATIVE 05/11/2010 0004   KETONESUR NEGATIVE 05/11/2010 0004   PROTEINUR NEGATIVE 05/11/2010 0004   UROBILINOGEN 0.2 05/11/2010 0004   NITRITE NEGATIVE 05/11/2010 0004   LEUKOCYTESUR  05/11/2010 0004    NEGATIVE MICROSCOPIC NOT DONE ON URINES WITH NEGATIVE PROTEIN, BLOOD, LEUKOCYTES, NITRITE, OR GLUCOSE <1000 mg/dL.   Sepsis Labs Invalid input(s): PROCALCITONIN,  WBC,  LACTICIDVEN Microbiology No results found for this or any previous visit (from the past 240 hour(s)).   Time coordinating discharge: 29 minutes  SIGNED:   Rosita Fire, MD  [facility] 08/18/2016, 10:33 AM  If 7PM-7AM, please contact night-coverage [email or web address] Password [email or web address]

## 2016-08-18 NOTE — Telephone Encounter (Signed)
Pt may be going home today from hospital -recent dx stroke. appt Thursday for chemo education and infusion. Should she keep appts? I told her to keep appts unless otherwise notified.

## 2016-08-20 ENCOUNTER — Other Ambulatory Visit: Payer: Medicare Other

## 2016-08-20 ENCOUNTER — Encounter: Payer: Self-pay | Admitting: *Deleted

## 2016-08-20 ENCOUNTER — Other Ambulatory Visit: Payer: Self-pay | Admitting: Internal Medicine

## 2016-08-21 ENCOUNTER — Other Ambulatory Visit (HOSPITAL_BASED_OUTPATIENT_CLINIC_OR_DEPARTMENT_OTHER): Payer: Medicare Other

## 2016-08-21 ENCOUNTER — Ambulatory Visit (HOSPITAL_BASED_OUTPATIENT_CLINIC_OR_DEPARTMENT_OTHER): Payer: Medicare Other

## 2016-08-21 ENCOUNTER — Institutional Professional Consult (permissible substitution): Payer: Medicare Other | Admitting: Pulmonary Disease

## 2016-08-21 VITALS — BP 163/94 | HR 59 | Temp 97.8°F | Resp 18

## 2016-08-21 DIAGNOSIS — C3491 Malignant neoplasm of unspecified part of right bronchus or lung: Secondary | ICD-10-CM

## 2016-08-21 DIAGNOSIS — C3431 Malignant neoplasm of lower lobe, right bronchus or lung: Secondary | ICD-10-CM

## 2016-08-21 DIAGNOSIS — Z5111 Encounter for antineoplastic chemotherapy: Secondary | ICD-10-CM | POA: Diagnosis present

## 2016-08-21 LAB — CBC WITH DIFFERENTIAL/PLATELET
BASO%: 0.4 % (ref 0.0–2.0)
Basophils Absolute: 0 10*3/uL (ref 0.0–0.1)
EOS ABS: 0.8 10*3/uL — AB (ref 0.0–0.5)
EOS%: 11.3 % — ABNORMAL HIGH (ref 0.0–7.0)
HEMATOCRIT: 30.6 % — AB (ref 34.8–46.6)
HEMOGLOBIN: 9.6 g/dL — AB (ref 11.6–15.9)
LYMPH#: 2 10*3/uL (ref 0.9–3.3)
LYMPH%: 27.9 % (ref 14.0–49.7)
MCH: 29.1 pg (ref 25.1–34.0)
MCHC: 31.4 g/dL — ABNORMAL LOW (ref 31.5–36.0)
MCV: 92.7 fL (ref 79.5–101.0)
MONO#: 0.6 10*3/uL (ref 0.1–0.9)
MONO%: 9 % (ref 0.0–14.0)
NEUT#: 3.6 10*3/uL (ref 1.5–6.5)
NEUT%: 51.4 % (ref 38.4–76.8)
PLATELETS: 253 10*3/uL (ref 145–400)
RBC: 3.3 10*6/uL — ABNORMAL LOW (ref 3.70–5.45)
RDW: 16.6 % — ABNORMAL HIGH (ref 11.2–14.5)
WBC: 7 10*3/uL (ref 3.9–10.3)

## 2016-08-21 LAB — COMPREHENSIVE METABOLIC PANEL
ALBUMIN: 3.1 g/dL — AB (ref 3.5–5.0)
ALT: 10 U/L (ref 0–55)
ANION GAP: 10 meq/L (ref 3–11)
AST: 19 U/L (ref 5–34)
Alkaline Phosphatase: 69 U/L (ref 40–150)
BILIRUBIN TOTAL: 0.32 mg/dL (ref 0.20–1.20)
BUN: 44.3 mg/dL — ABNORMAL HIGH (ref 7.0–26.0)
CALCIUM: 9.6 mg/dL (ref 8.4–10.4)
CO2: 23 mEq/L (ref 22–29)
Chloride: 106 mEq/L (ref 98–109)
Creatinine: 2.3 mg/dL — ABNORMAL HIGH (ref 0.6–1.1)
EGFR: 22 mL/min/{1.73_m2} — AB (ref >90)
Glucose: 204 mg/dl — ABNORMAL HIGH (ref 70–140)
Potassium: 5.2 mEq/L — ABNORMAL HIGH (ref 3.5–5.1)
Sodium: 138 mEq/L (ref 136–145)
TOTAL PROTEIN: 7.3 g/dL (ref 6.4–8.3)

## 2016-08-21 MED ORDER — PALONOSETRON HCL INJECTION 0.25 MG/5ML
0.2500 mg | Freq: Once | INTRAVENOUS | Status: AC
  Administered 2016-08-21: 0.25 mg via INTRAVENOUS

## 2016-08-21 MED ORDER — SODIUM CHLORIDE 0.9 % IV SOLN
175.0000 mg/m2 | Freq: Once | INTRAVENOUS | Status: AC
  Administered 2016-08-21: 300 mg via INTRAVENOUS
  Filled 2016-08-21: qty 50

## 2016-08-21 MED ORDER — SODIUM CHLORIDE 0.9% FLUSH
10.0000 mL | INTRAVENOUS | Status: DC | PRN
  Filled 2016-08-21: qty 10

## 2016-08-21 MED ORDER — PEGFILGRASTIM 6 MG/0.6ML ~~LOC~~ PSKT
6.0000 mg | PREFILLED_SYRINGE | Freq: Once | SUBCUTANEOUS | Status: AC
  Administered 2016-08-21: 6 mg via SUBCUTANEOUS
  Filled 2016-08-21: qty 0.6

## 2016-08-21 MED ORDER — DIPHENHYDRAMINE HCL 50 MG/ML IJ SOLN
50.0000 mg | Freq: Once | INTRAMUSCULAR | Status: AC
  Administered 2016-08-21: 50 mg via INTRAVENOUS

## 2016-08-21 MED ORDER — HEPARIN SOD (PORK) LOCK FLUSH 100 UNIT/ML IV SOLN
500.0000 [IU] | Freq: Once | INTRAVENOUS | Status: DC | PRN
  Filled 2016-08-21: qty 5

## 2016-08-21 MED ORDER — PALONOSETRON HCL INJECTION 0.25 MG/5ML
INTRAVENOUS | Status: AC
  Filled 2016-08-21: qty 5

## 2016-08-21 MED ORDER — DIPHENHYDRAMINE HCL 50 MG/ML IJ SOLN
INTRAMUSCULAR | Status: AC
  Filled 2016-08-21: qty 1

## 2016-08-21 MED ORDER — SODIUM CHLORIDE 0.9 % IV SOLN
218.0000 mg | Freq: Once | INTRAVENOUS | Status: AC
  Administered 2016-08-21: 220 mg via INTRAVENOUS
  Filled 2016-08-21: qty 22

## 2016-08-21 MED ORDER — DEXAMETHASONE SODIUM PHOSPHATE 100 MG/10ML IJ SOLN
20.0000 mg | Freq: Once | INTRAMUSCULAR | Status: AC
  Administered 2016-08-21: 20 mg via INTRAVENOUS
  Filled 2016-08-21: qty 2

## 2016-08-21 MED ORDER — SODIUM CHLORIDE 0.9 % IV SOLN
Freq: Once | INTRAVENOUS | Status: AC
  Administered 2016-08-21: 13:00:00 via INTRAVENOUS

## 2016-08-21 MED ORDER — FAMOTIDINE IN NACL 20-0.9 MG/50ML-% IV SOLN
20.0000 mg | Freq: Once | INTRAVENOUS | Status: AC
  Administered 2016-08-21: 20 mg via INTRAVENOUS

## 2016-08-21 MED ORDER — FAMOTIDINE IN NACL 20-0.9 MG/50ML-% IV SOLN
INTRAVENOUS | Status: AC
  Filled 2016-08-21: qty 50

## 2016-08-21 NOTE — Patient Instructions (Addendum)
[place] Discharge Instructions for Patients Receiving Chemotherapy  Today you received the following chemotherapy agents paclitaxel (Taxol) and carboplatin (Paraplatin)  To help prevent nausea and vomiting after your treatment, we encourage you to take your nausea medication as directed by your MD.   If you develop nausea and vomiting that is not controlled by your nausea medication, call the clinic.   BELOW ARE SYMPTOMS THAT SHOULD BE REPORTED IMMEDIATELY:  *FEVER GREATER THAN 100.5 F  *CHILLS WITH OR WITHOUT FEVER  NAUSEA AND VOMITING THAT IS NOT CONTROLLED WITH YOUR NAUSEA MEDICATION  *UNUSUAL SHORTNESS OF BREATH  *UNUSUAL BRUISING OR BLEEDING  TENDERNESS IN MOUTH AND THROAT WITH OR WITHOUT PRESENCE OF ULCERS  *URINARY PROBLEMS  *BOWEL PROBLEMS  UNUSUAL RASH Items with * indicate a potential emergency and should be followed up as soon as possible.  Feel free to call the clinic you have any questions or concerns. The clinic phone number is [phone].  Please show the [place] at check-in to the Emergency Department and triage nurse.  Diphenhydramine injection What is this medicine? DIPHENHYDRAMINE (dye fen HYE dra meen) is an antihistamine. It is used to treat the symptoms of an allergic reaction and motion sickness. It is also used to treat Parkinson's disease. This medicine may be used for other purposes; ask your health care provider or pharmacist if you have questions. COMMON BRAND NAME(S): Benadryl What should I tell my health care provider before I take this medicine? They need to know if you have any of these conditions: -asthma or lung disease -glaucoma -high blood pressure or heart disease -liver disease -pain or difficulty passing urine -prostate trouble -ulcers or other stomach problems -an unusual or allergic reaction to diphenhydramine, antihistamines, other medicines foods, dyes, or preservatives -pregnant or trying to  get pregnant -breast-feeding How should I use this medicine? This medicine is for injection into a vein or a muscle. It is usually given by a health care professional in a hospital or clinic setting. If you get this medicine at home, you will be taught how to prepare and give this medicine. Use exactly as directed. Take your medicine at regular intervals. Do not take your medicine more often than directed. It is important that you put your used needles and syringes in a special sharps container. Do not put them in a trash can. If you do not have a sharps container, call your pharmacist or healthcare provider to get one. Talk to your pediatrician regarding the use of this medicine in children. While this drug may be prescribed for selected conditions, precautions do apply. This medicine is not approved for use in newborns and premature babies. Patients over 49 years old may have a stronger reaction and need a smaller dose. Overdosage: If you think you have taken too much of this medicine contact a poison control center or emergency room at once. NOTE: This medicine is only for you. Do not share this medicine with others. What if I miss a dose? If you miss a dose, take it as soon as you can. If it is almost time for your next dose, take only that dose. Do not take double or extra doses. What may interact with this medicine? Do not take this medicine with any of the following medications: -MAOIs like Carbex, Eldepryl, Marplan, Nardil, and Parnate This medicine may also interact with the following medications: -alcohol -barbiturates, like phenobarbital -medicines for bladder spasm like oxybutynin, tolterodine -medicines for blood pressure -medicines for  depression, anxiety, or psychotic disturbances -medicines for movement abnormalities or Parkinson's disease -medicines for sleep -other medicines for cold, cough or allergy -some medicines for the stomach like chlordiazepoxide, dicyclomine This  list may not describe all possible interactions. Give your health care provider a list of all the medicines, herbs, non-prescription drugs, or dietary supplements you use. Also tell them if you smoke, drink alcohol, or use illegal drugs. Some items may interact with your medicine. What should I watch for while using this medicine? Your condition will be monitored carefully while you are receiving this medicine. Tell your doctor or healthcare professional if your symptoms do not start to get better or if they get worse. You may get drowsy or dizzy. Do not drive, use machinery, or do anything that needs mental alertness until you know how this medicine affects you. Do not stand or sit up quickly, especially if you are an older patient. This reduces the risk of dizzy or fainting spells. Alcohol may interfere with the effect of this medicine. Avoid alcoholic drinks. Your mouth may get dry. Chewing sugarless gum or sucking hard candy, and drinking plenty of water may help. Contact your doctor if the problem does not go away or is severe. What side effects may I notice from receiving this medicine? Side effects that you should report to your doctor or health care professional as soon as possible: -allergic reactions like skin rash, itching or hives, swelling of the face, lips, or tongue -breathing problems -changes in vision -chills -confused, agitated, nervous -irregular or fast heartbeat -low blood pressure -seizures -tremor -trouble passing urine -unusual bleeding or bruising -unusually weak or tired Side effects that usually do not require medical attention (report to your doctor or health care professional if they continue or are bothersome): -constipation, diarrhea -drowsy -headache -loss of appetite -stomach upset, vomiting -sweating -thick mucous This list may not describe all possible side effects. Call your doctor for medical advice about side effects. You may report side effects to  FDA at 1-800-FDA-1088. Where should I keep my medicine? Keep out of the reach of children. If you are using this medicine at home, you will be instructed on how to store this medicine. Throw away any unused medicine after the expiration date on the label. NOTE: This sheet is a summary. It may not cover all possible information. If you have questions about this medicine, talk to your doctor, pharmacist, or health care provider.  2018 Elsevier/Gold Standard (2007-08-10 14:28:35)  Palonosetron Injection What is this medicine? PALONOSETRON (pal oh NOE se tron) is used to prevent nausea and vomiting caused by chemotherapy. It also helps prevent delayed nausea and vomiting that may occur a few days after your treatment. This medicine may be used for other purposes; ask your health care provider or pharmacist if you have questions. COMMON BRAND NAME(S): Aloxi What should I tell my health care provider before I take this medicine? They need to know if you have any of these conditions: -an unusual or allergic reaction to palonosetron, dolasetron, granisetron, ondansetron, other medicines, foods, dyes, or preservatives -pregnant or trying to get pregnant -breast-feeding How should I use this medicine? This medicine is for infusion into a vein. It is given by a health care professional in a hospital or clinic setting. Talk to your pediatrician regarding the use of this medicine in children. While this drug may be prescribed for children as young as 1 month for selected conditions, precautions do apply. Overdosage: If you think you have taken  too much of this medicine contact a poison control center or emergency room at once. NOTE: This medicine is only for you. Do not share this medicine with others. What if I miss a dose? This does not apply. What may interact with this medicine? -certain medicines for depression, anxiety, or psychotic disturbances -fentanyl -linezolid -MAOIs like Carbex,  Eldepryl, Marplan, Nardil, and Parnate -methylene blue (injected into a vein) -tramadol This list may not describe all possible interactions. Give your health care provider a list of all the medicines, herbs, non-prescription drugs, or dietary supplements you use. Also tell them if you smoke, drink alcohol, or use illegal drugs. Some items may interact with your medicine. What should I watch for while using this medicine? Your condition will be monitored carefully while you are receiving this medicine. What side effects may I notice from receiving this medicine? Side effects that you should report to your doctor or health care professional as soon as possible: -allergic reactions like skin rash, itching or hives, swelling of the face, lips, or tongue -breathing problems -confusion -dizziness -fast, irregular heartbeat -fever and chills -loss of balance or coordination -seizures -sweating -swelling of the hands and feet -tremors -unusually weak or tired Side effects that usually do not require medical attention (report to your doctor or health care professional if they continue or are bothersome): -constipation or diarrhea -headache This list may not describe all possible side effects. Call your doctor for medical advice about side effects. You may report side effects to FDA at 1-800-FDA-1088. Where should I keep my medicine? This drug is given in a hospital or clinic and will not be stored at home. NOTE: This sheet is a summary. It may not cover all possible information. If you have questions about this medicine, talk to your doctor, pharmacist, or health care provider.  2018 Elsevier/Gold Standard (2013-02-25 10:38:36)   Famotidine injection What is this medicine? FAMOTIDINE (fa MOE ti deen) is a type of antihistamine that blocks the release of stomach acid. It is used to treat stomach or intestinal ulcers. It can relieve ulcer pain and discomfort, and the heartburn from acid  reflux. This medicine may be used for other purposes; ask your health care provider or pharmacist if you have questions. COMMON BRAND NAME(S): Pepcid What should I tell my health care provider before I take this medicine? They need to know if you have any of these conditions: -kidney or liver disease -an unusual or allergic reaction to famotidine, other medicines, foods, dyes, or preservatives -pregnant or trying to get pregnant -breast-feeding How should I use this medicine? This medicine is for infusion into a vein. It is given by a health care professional in a hospital or clinic setting. Talk to your pediatrician regarding the use of this medicine in children. Special care may be needed. Overdosage: If you think you have taken too much of this medicine contact a poison control center or emergency room at once. NOTE: This medicine is only for you. Do not share this medicine with others. What if I miss a dose? This does not apply. What may interact with this medicine? -delavirdine -itraconazole -ketoconazole This list may not describe all possible interactions. Give your health care provider a list of all the medicines, herbs, non-prescription drugs, or dietary supplements you use. Also tell them if you smoke, drink alcohol, or use illegal drugs. Some items may interact with your medicine. What should I watch for while using this medicine? Tell your doctor or health care professional  if your condition does not start to get better or gets worse. Do not take with aspirin, ibuprofen, or other antiinflammatory medicines. These can aggravate your condition. Do not smoke cigarettes or drink alcohol. These increase irritation in your stomach and can increase the time it will take for ulcers to heal. Cigarettes and alcohol can also worsen acid reflux or heartburn. If you get black, tarry stools or vomit up what looks like coffee grounds, call your doctor or health care professional at once. You  may have a bleeding ulcer. What side effects may I notice from receiving this medicine? Side effects that you should report to your doctor or health care professional as soon as possible: -allergic reactions like skin rash, itching or hives, swelling of the face, lips, or tongue -agitation, nervousness -confusion -hallucinations Side effects that usually do not require medical attention (report to your doctor or health care professional if they continue or are bothersome): -constipation -diarrhea -dizziness -headache This list may not describe all possible side effects. Call your doctor for medical advice about side effects. You may report side effects to FDA at 1-800-FDA-1088. Where should I keep my medicine? This medicine is given in a hospital or clinic. You will not be given this medicine to store at home. NOTE: This sheet is a summary. It may not cover all possible information. If you have questions about this medicine, talk to your doctor, pharmacist, or health care provider.  2018 Elsevier/Gold Standard (2007-08-25 13:24:51)  Dexamethasone injection What is this medicine? DEXAMETHASONE (dex a METH a sone) is a corticosteroid. It is used to treat inflammation of the skin, joints, lungs, and other organs. Common conditions treated include asthma, allergies, and arthritis. It is also used for other conditions, like blood disorders and diseases of the adrenal glands. This medicine may be used for other purposes; ask your health care provider or pharmacist if you have questions. COMMON BRAND NAME(S): Decadron, DoubleDex, Simplist Dexamethasone, Solurex What should I tell my health care provider before I take this medicine? They need to know if you have any of these conditions: -blood clotting problems -Cushing's syndrome -diabetes -glaucoma -heart problems or disease -high blood pressure -infection like herpes, measles, tuberculosis, or chickenpox -kidney disease -liver  disease -mental problems -myasthenia gravis -osteoporosis -previous heart attack -seizures -stomach, ulcer or intestine disease including colitis and diverticulitis -thyroid problem -an unusual or allergic reaction to dexamethasone, corticosteroids, other medicines, lactose, foods, dyes, or preservatives -pregnant or trying to get pregnant -breast-feeding How should I use this medicine? This medicine is for injection into a muscle, joint, lesion, soft tissue, or vein. It is given by a health care professional in a hospital or clinic setting. Talk to your pediatrician regarding the use of this medicine in children. Special care may be needed. Overdosage: If you think you have taken too much of this medicine contact a poison control center or emergency room at once. NOTE: This medicine is only for you. Do not share this medicine with others. What if I miss a dose? This may not apply. If you are having a series of injections over a prolonged period, try not to miss an appointment. Call your doctor or health care professional to reschedule if you are unable to keep an appointment. What may interact with this medicine? Do not take this medicine with any of the following medications: -mifepristone, RU-486 -vaccines This medicine may also interact with the following medications: -amphotericin B -antibiotics like clarithromycin, erythromycin, and troleandomycin -aspirin and aspirin-like drugs -barbiturates  like phenobarbital -carbamazepine -cholestyramine -cholinesterase inhibitors like donepezil, galantamine, rivastigmine, and tacrine -cyclosporine -digoxin -diuretics -ephedrine -female hormones, like estrogens or progestins and birth control pills -indinavir -isoniazid -ketoconazole -medicines for diabetes -medicines that improve muscle tone or strength for conditions like myasthenia gravis -NSAIDs, medicines for pain and inflammation, like ibuprofen or  naproxen -phenytoin -rifampin -thalidomide -warfarin This list may not describe all possible interactions. Give your health care provider a list of all the medicines, herbs, non-prescription drugs, or dietary supplements you use. Also tell them if you smoke, drink alcohol, or use illegal drugs. Some items may interact with your medicine. What should I watch for while using this medicine? Your condition will be monitored carefully while you are receiving this medicine. If you are taking this medicine for a long time, carry an identification card with your name and address, the type and dose of your medicine, and your doctor's name and address. This medicine may increase your risk of getting an infection. Stay away from people who are sick. Tell your doctor or health care professional if you are around anyone with measles or chickenpox. Talk to your health care provider before you get any vaccines that you take this medicine. If you are going to have surgery, tell your doctor or health care professional that you have taken this medicine within the last twelve months. Ask your doctor or health care professional about your diet. You may need to lower the amount of salt you eat. The medicine can increase your blood sugar. If you are a diabetic check with your doctor if you need help adjusting the dose of your diabetic medicine. What side effects may I notice from receiving this medicine? Side effects that you should report to your doctor or health care professional as soon as possible: -allergic reactions like skin rash, itching or hives, swelling of the face, lips, or tongue -black or tarry stools -change in the amount of urine -changes in vision -confusion, excitement, restlessness, a false sense of well-being -fever, sore throat, sneezing, cough, or other signs of infection, wounds that will not heal -hallucinations -increased thirst -mental depression, mood swings, mistaken feelings of self  importance or of being mistreated -pain in hips, back, ribs, arms, shoulders, or legs -pain, redness, or irritation at the injection site -redness, blistering, peeling or loosening of the skin, including inside the mouth -rounding out of face -swelling of feet or lower legs -unusual bleeding or bruising -unusual tired or weak -wounds that do not heal Side effects that usually do not require medical attention (report to your doctor or health care professional if they continue or are bothersome): -diarrhea or constipation -change in taste -headache -nausea, vomiting -skin problems, acne, thin and shiny skin -touble sleeping -unusual growth of hair on the face or body -weight gain This list may not describe all possible side effects. Call your doctor for medical advice about side effects. You may report side effects to FDA at 1-800-FDA-1088. Where should I keep my medicine? This drug is given in a hospital or clinic and will not be stored at home. NOTE: This sheet is a summary. It may not cover all possible information. If you have questions about this medicine, talk to your doctor, pharmacist, or health care provider.  2018 Elsevier/Gold Standard (2007-08-12 14:04:12)  Paclitaxel injection What is this medicine? PACLITAXEL (PAK li TAX el) is a chemotherapy drug. It targets fast dividing cells, like cancer cells, and causes these cells to die. This medicine is used to treat ovarian  cancer, breast cancer, and other cancers. This medicine may be used for other purposes; ask your health care provider or pharmacist if you have questions. COMMON BRAND NAME(S): Onxol, Taxol What should I tell my health care provider before I take this medicine? They need to know if you have any of these conditions: -blood disorders -irregular heartbeat -infection (especially a virus infection such as chickenpox, cold sores, or herpes) -liver disease -previous or ongoing radiation therapy -an unusual or  allergic reaction to paclitaxel, alcohol, polyoxyethylated castor oil, other chemotherapy agents, other medicines, foods, dyes, or preservatives -pregnant or trying to get pregnant -breast-feeding How should I use this medicine? This drug is given as an infusion into a vein. It is administered in a hospital or clinic by a specially trained health care professional. Talk to your pediatrician regarding the use of this medicine in children. Special care may be needed. Overdosage: If you think you have taken too much of this medicine contact a poison control center or emergency room at once. NOTE: This medicine is only for you. Do not share this medicine with others. What if I miss a dose? It is important not to miss your dose. Call your doctor or health care professional if you are unable to keep an appointment. What may interact with this medicine? Do not take this medicine with any of the following medications: -disulfiram -metronidazole This medicine may also interact with the following medications: -cyclosporine -diazepam -ketoconazole -medicines to increase blood counts like filgrastim, pegfilgrastim, sargramostim -other chemotherapy drugs like cisplatin, doxorubicin, epirubicin, etoposide, teniposide, vincristine -quinidine -testosterone -vaccines -verapamil Talk to your doctor or health care professional before taking any of these medicines: -acetaminophen -aspirin -ibuprofen -ketoprofen -naproxen This list may not describe all possible interactions. Give your health care provider a list of all the medicines, herbs, non-prescription drugs, or dietary supplements you use. Also tell them if you smoke, drink alcohol, or use illegal drugs. Some items may interact with your medicine. What should I watch for while using this medicine? Your condition will be monitored carefully while you are receiving this medicine. You will need important blood work done while you are taking this  medicine. This medicine can cause serious allergic reactions. To reduce your risk you will need to take other medicine(s) before treatment with this medicine. If you experience allergic reactions like skin rash, itching or hives, swelling of the face, lips, or tongue, tell your doctor or health care professional right away. In some cases, you may be given additional medicines to help with side effects. Follow all directions for their use. This drug may make you feel generally unwell. This is not uncommon, as chemotherapy can affect healthy cells as well as cancer cells. Report any side effects. Continue your course of treatment even though you feel ill unless your doctor tells you to stop. Call your doctor or health care professional for advice if you get a fever, chills or sore throat, or other symptoms of a cold or flu. Do not treat yourself. This drug decreases your body's ability to fight infections. Try to avoid being around people who are sick. This medicine may increase your risk to bruise or bleed. Call your doctor or health care professional if you notice any unusual bleeding. Be careful brushing and flossing your teeth or using a toothpick because you may get an infection or bleed more easily. If you have any dental work done, tell your dentist you are receiving this medicine. Avoid taking products that contain aspirin, acetaminophen,  ibuprofen, naproxen, or ketoprofen unless instructed by your doctor. These medicines may hide a fever. Do not become pregnant while taking this medicine. Women should inform their doctor if they wish to become pregnant or think they might be pregnant. There is a potential for serious side effects to an unborn child. Talk to your health care professional or pharmacist for more information. Do not breast-feed an infant while taking this medicine. Men are advised not to father a child while receiving this medicine. This product may contain alcohol. Ask your pharmacist  or healthcare provider if this medicine contains alcohol. Be sure to tell all healthcare providers you are taking this medicine. Certain medicines, like metronidazole and disulfiram, can cause an unpleasant reaction when taken with alcohol. The reaction includes flushing, headache, nausea, vomiting, sweating, and increased thirst. The reaction can last from 30 minutes to several hours. What side effects may I notice from receiving this medicine? Side effects that you should report to your doctor or health care professional as soon as possible: -allergic reactions like skin rash, itching or hives, swelling of the face, lips, or tongue -low blood counts - This drug may decrease the number of white blood cells, red blood cells and platelets. You may be at increased risk for infections and bleeding. -signs of infection - fever or chills, cough, sore throat, pain or difficulty passing urine -signs of decreased platelets or bleeding - bruising, pinpoint red spots on the skin, black, tarry stools, nosebleeds -signs of decreased red blood cells - unusually weak or tired, fainting spells, lightheadedness -breathing problems -chest pain -high or low blood pressure -mouth sores -nausea and vomiting -pain, swelling, redness or irritation at the injection site -pain, tingling, numbness in the hands or feet -slow or irregular heartbeat -swelling of the ankle, feet, hands Side effects that usually do not require medical attention (report to your doctor or health care professional if they continue or are bothersome): -bone pain -complete hair loss including hair on your head, underarms, pubic hair, eyebrows, and eyelashes -changes in the color of fingernails -diarrhea -loosening of the fingernails -loss of appetite -muscle or joint pain -red flush to skin -sweating This list may not describe all possible side effects. Call your doctor for medical advice about side effects. You may report side effects to  FDA at 1-800-FDA-1088. Where should I keep my medicine? This drug is given in a hospital or clinic and will not be stored at home. NOTE: This sheet is a summary. It may not cover all possible information. If you have questions about this medicine, talk to your doctor, pharmacist, or health care provider.  2018 Elsevier/Gold Standard (2015-02-20 19:58:00)  Carboplatin injection What is this medicine? CARBOPLATIN (KAR boe pla tin) is a chemotherapy drug. It targets fast dividing cells, like cancer cells, and causes these cells to die. This medicine is used to treat ovarian cancer and many other cancers. This medicine may be used for other purposes; ask your health care provider or pharmacist if you have questions. COMMON BRAND NAME(S): Paraplatin What should I tell my health care provider before I take this medicine? They need to know if you have any of these conditions: -blood disorders -hearing problems -kidney disease -recent or ongoing radiation therapy -an unusual or allergic reaction to carboplatin, cisplatin, other chemotherapy, other medicines, foods, dyes, or preservatives -pregnant or trying to get pregnant -breast-feeding How should I use this medicine? This drug is usually given as an infusion into a vein. It is administered in a  hospital or clinic by a specially trained health care professional. Talk to your pediatrician regarding the use of this medicine in children. Special care may be needed. Overdosage: If you think you have taken too much of this medicine contact a poison control center or emergency room at once. NOTE: This medicine is only for you. Do not share this medicine with others. What if I miss a dose? It is important not to miss a dose. Call your doctor or health care professional if you are unable to keep an appointment. What may interact with this medicine? -medicines for seizures -medicines to increase blood counts like filgrastim, pegfilgrastim,  sargramostim -some antibiotics like amikacin, gentamicin, neomycin, streptomycin, tobramycin -vaccines Talk to your doctor or health care professional before taking any of these medicines: -acetaminophen -aspirin -ibuprofen -ketoprofen -naproxen This list may not describe all possible interactions. Give your health care provider a list of all the medicines, herbs, non-prescription drugs, or dietary supplements you use. Also tell them if you smoke, drink alcohol, or use illegal drugs. Some items may interact with your medicine. What should I watch for while using this medicine? Your condition will be monitored carefully while you are receiving this medicine. You will need important blood work done while you are taking this medicine. This drug may make you feel generally unwell. This is not uncommon, as chemotherapy can affect healthy cells as well as cancer cells. Report any side effects. Continue your course of treatment even though you feel ill unless your doctor tells you to stop. In some cases, you may be given additional medicines to help with side effects. Follow all directions for their use. Call your doctor or health care professional for advice if you get a fever, chills or sore throat, or other symptoms of a cold or flu. Do not treat yourself. This drug decreases your body's ability to fight infections. Try to avoid being around people who are sick. This medicine may increase your risk to bruise or bleed. Call your doctor or health care professional if you notice any unusual bleeding. Be careful brushing and flossing your teeth or using a toothpick because you may get an infection or bleed more easily. If you have any dental work done, tell your dentist you are receiving this medicine. Avoid taking products that contain aspirin, acetaminophen, ibuprofen, naproxen, or ketoprofen unless instructed by your doctor. These medicines may hide a fever. Do not become pregnant while taking this  medicine. Women should inform their doctor if they wish to become pregnant or think they might be pregnant. There is a potential for serious side effects to an unborn child. Talk to your health care professional or pharmacist for more information. Do not breast-feed an infant while taking this medicine. What side effects may I notice from receiving this medicine? Side effects that you should report to your doctor or health care professional as soon as possible: -allergic reactions like skin rash, itching or hives, swelling of the face, lips, or tongue -signs of infection - fever or chills, cough, sore throat, pain or difficulty passing urine -signs of decreased platelets or bleeding - bruising, pinpoint red spots on the skin, black, tarry stools, nosebleeds -signs of decreased red blood cells - unusually weak or tired, fainting spells, lightheadedness -breathing problems -changes in hearing -changes in vision -chest pain -high blood pressure -low blood counts - This drug may decrease the number of white blood cells, red blood cells and platelets. You may be at increased risk for infections and  bleeding. -nausea and vomiting -pain, swelling, redness or irritation at the injection site -pain, tingling, numbness in the hands or feet -problems with balance, talking, walking -trouble passing urine or change in the amount of urine Side effects that usually do not require medical attention (report to your doctor or health care professional if they continue or are bothersome): -hair loss -loss of appetite -metallic taste in the mouth or changes in taste This list may not describe all possible side effects. Call your doctor for medical advice about side effects. You may report side effects to FDA at 1-800-FDA-1088. Where should I keep my medicine? This drug is given in a hospital or clinic and will not be stored at home. NOTE: This sheet is a summary. It may not cover all possible information. If you  have questions about this medicine, talk to your doctor, pharmacist, or health care provider.  2018 Elsevier/Gold Standard (2007-07-27 14:38:05)

## 2016-08-21 NOTE — Progress Notes (Signed)
CBC and CMET reviewed by MD ok to treat with SCr 2.3.

## 2016-08-22 ENCOUNTER — Telehealth: Payer: Self-pay | Admitting: *Deleted

## 2016-08-22 DIAGNOSIS — I2510 Atherosclerotic heart disease of native coronary artery without angina pectoris: Secondary | ICD-10-CM | POA: Diagnosis not present

## 2016-08-22 DIAGNOSIS — F3289 Other specified depressive episodes: Secondary | ICD-10-CM | POA: Diagnosis not present

## 2016-08-22 DIAGNOSIS — C3490 Malignant neoplasm of unspecified part of unspecified bronchus or lung: Secondary | ICD-10-CM | POA: Diagnosis not present

## 2016-08-22 DIAGNOSIS — I10 Essential (primary) hypertension: Secondary | ICD-10-CM | POA: Diagnosis not present

## 2016-08-22 DIAGNOSIS — Z6823 Body mass index (BMI) 23.0-23.9, adult: Secondary | ICD-10-CM | POA: Diagnosis not present

## 2016-08-22 DIAGNOSIS — N184 Chronic kidney disease, stage 4 (severe): Secondary | ICD-10-CM | POA: Diagnosis not present

## 2016-08-22 DIAGNOSIS — J439 Emphysema, unspecified: Secondary | ICD-10-CM | POA: Diagnosis not present

## 2016-08-22 DIAGNOSIS — E1151 Type 2 diabetes mellitus with diabetic peripheral angiopathy without gangrene: Secondary | ICD-10-CM | POA: Diagnosis not present

## 2016-08-22 NOTE — Telephone Encounter (Signed)
Called Erica Jimenez regarding clearance for Plavix, inquired who her cardiologist is. Erica Jimenez uable to remember, said she used to see Dr. Aundra Dubin however she was sent to a new provider. Erica Jimenez unable to remember other names of her physicians.Attempt to reach Dr. Buel Ream office lvm for his nurse to please call clinic. Spoke with Dr. [name] reviewed Erica Jimenez's recent history Verbal order to cancel [facility] placement Erica Jimenez to continue plavix. Called Erica Jimenez, unable to reach. Called pts daughter [name] Ends with instructions to have to continue taking Plavix and she will not be having PAC procedure due to revent CVA. Peggy advised she will see her mother today and relay the instructions/message. Message to scheduling to cancel [facility] placement

## 2016-08-24 ENCOUNTER — Encounter (HOSPITAL_COMMUNITY): Payer: Self-pay | Admitting: Emergency Medicine

## 2016-08-24 ENCOUNTER — Other Ambulatory Visit: Payer: Self-pay

## 2016-08-24 ENCOUNTER — Emergency Department (HOSPITAL_COMMUNITY)
Admission: EM | Admit: 2016-08-24 | Discharge: 2016-08-24 | Disposition: A | Discharge status: Home / Self Care | Payer: Medicare Other | Attending: Emergency Medicine | Admitting: Emergency Medicine

## 2016-08-24 ENCOUNTER — Emergency Department (HOSPITAL_COMMUNITY): Payer: Medicare Other

## 2016-08-24 DIAGNOSIS — Z8551 Personal history of malignant neoplasm of bladder: Secondary | ICD-10-CM | POA: Insufficient documentation

## 2016-08-24 DIAGNOSIS — Z87891 Personal history of nicotine dependence: Secondary | ICD-10-CM | POA: Diagnosis not present

## 2016-08-24 DIAGNOSIS — R042 Hemoptysis: Secondary | ICD-10-CM | POA: Insufficient documentation

## 2016-08-24 DIAGNOSIS — J441 Chronic obstructive pulmonary disease with (acute) exacerbation: Secondary | ICD-10-CM | POA: Diagnosis not present

## 2016-08-24 DIAGNOSIS — Z794 Long term (current) use of insulin: Secondary | ICD-10-CM | POA: Diagnosis not present

## 2016-08-24 DIAGNOSIS — Z79899 Other long term (current) drug therapy: Secondary | ICD-10-CM | POA: Diagnosis not present

## 2016-08-24 DIAGNOSIS — I252 Old myocardial infarction: Secondary | ICD-10-CM | POA: Insufficient documentation

## 2016-08-24 DIAGNOSIS — I129 Hypertensive chronic kidney disease with stage 1 through stage 4 chronic kidney disease, or unspecified chronic kidney disease: Secondary | ICD-10-CM | POA: Insufficient documentation

## 2016-08-24 DIAGNOSIS — J449 Chronic obstructive pulmonary disease, unspecified: Secondary | ICD-10-CM | POA: Insufficient documentation

## 2016-08-24 DIAGNOSIS — N183 Chronic kidney disease, stage 3 (moderate): Secondary | ICD-10-CM | POA: Insufficient documentation

## 2016-08-24 DIAGNOSIS — I2510 Atherosclerotic heart disease of native coronary artery without angina pectoris: Secondary | ICD-10-CM | POA: Insufficient documentation

## 2016-08-24 DIAGNOSIS — R9431 Abnormal electrocardiogram [ECG] [EKG]: Secondary | ICD-10-CM | POA: Diagnosis not present

## 2016-08-24 DIAGNOSIS — Z7982 Long term (current) use of aspirin: Secondary | ICD-10-CM | POA: Insufficient documentation

## 2016-08-24 DIAGNOSIS — E039 Hypothyroidism, unspecified: Secondary | ICD-10-CM | POA: Diagnosis not present

## 2016-08-24 DIAGNOSIS — Z85118 Personal history of other malignant neoplasm of bronchus and lung: Secondary | ICD-10-CM | POA: Insufficient documentation

## 2016-08-24 DIAGNOSIS — R0602 Shortness of breath: Secondary | ICD-10-CM | POA: Diagnosis present

## 2016-08-24 DIAGNOSIS — C3491 Malignant neoplasm of unspecified part of right bronchus or lung: Secondary | ICD-10-CM | POA: Diagnosis not present

## 2016-08-24 DIAGNOSIS — E1122 Type 2 diabetes mellitus with diabetic chronic kidney disease: Secondary | ICD-10-CM | POA: Insufficient documentation

## 2016-08-24 LAB — CBC
HCT: 33.9 % — ABNORMAL LOW (ref 36.0–46.0)
Hemoglobin: 10.9 g/dL — ABNORMAL LOW (ref 12.0–15.0)
MCH: 29.3 pg (ref 26.0–34.0)
MCHC: 32.2 g/dL (ref 30.0–36.0)
MCV: 91.1 fL (ref 78.0–100.0)
PLATELETS: 217 10*3/uL (ref 150–400)
RBC: 3.72 MIL/uL — ABNORMAL LOW (ref 3.87–5.11)
RDW: 16.7 % — ABNORMAL HIGH (ref 11.5–15.5)
WBC: 12.9 10*3/uL — AB (ref 4.0–10.5)

## 2016-08-24 LAB — I-STAT TROPONIN, ED: TROPONIN I, POC: 0.06 ng/mL (ref 0.00–0.08)

## 2016-08-24 LAB — COMPREHENSIVE METABOLIC PANEL
ALT: 10 U/L — ABNORMAL LOW (ref 14–54)
ANION GAP: 10 (ref 5–15)
AST: 28 U/L (ref 15–41)
Albumin: 3.4 g/dL — ABNORMAL LOW (ref 3.5–5.0)
Alkaline Phosphatase: 70 U/L (ref 38–126)
BILIRUBIN TOTAL: 0.7 mg/dL (ref 0.3–1.2)
BUN: 54 mg/dL — AB (ref 6–20)
CHLORIDE: 105 mmol/L (ref 101–111)
CO2: 22 mmol/L (ref 22–32)
Calcium: 8.7 mg/dL — ABNORMAL LOW (ref 8.9–10.3)
Creatinine, Ser: 2.02 mg/dL — ABNORMAL HIGH (ref 0.44–1.00)
GFR calc Af Amer: 25 mL/min — ABNORMAL LOW (ref >60)
GFR calc non Af Amer: 21 mL/min — ABNORMAL LOW (ref >60)
GLUCOSE: 168 mg/dL — AB (ref 65–99)
Potassium: 5 mmol/L (ref 3.5–5.1)
Sodium: 137 mmol/L (ref 135–145)
TOTAL PROTEIN: 7.5 g/dL (ref 6.5–8.1)

## 2016-08-24 MED ORDER — ALBUTEROL SULFATE (2.5 MG/3ML) 0.083% IN NEBU
5.0000 mg | INHALATION_SOLUTION | Freq: Once | RESPIRATORY_TRACT | Status: AC
  Administered 2016-08-24: 5 mg via RESPIRATORY_TRACT
  Filled 2016-08-24: qty 6

## 2016-08-24 MED ORDER — PREDNISONE 20 MG PO TABS: ORAL_TABLET | ORAL | 0 refills | Status: DC

## 2016-08-24 MED ORDER — PREDNISONE 20 MG PO TABS
60.0000 mg | ORAL_TABLET | Freq: Once | ORAL | Status: AC
  Administered 2016-08-24: 60 mg via ORAL
  Filled 2016-08-24: qty 3

## 2016-08-24 MED ORDER — IPRATROPIUM-ALBUTEROL 0.5-2.5 (3) MG/3ML IN SOLN
3.0000 mL | RESPIRATORY_TRACT | Status: AC
  Administered 2016-08-24: 3 mL via RESPIRATORY_TRACT
  Filled 2016-08-24: qty 3

## 2016-08-24 NOTE — ED Provider Notes (Signed)
[place] [facility] DEPT Provider Note   CSN: [id number] Arrival date & time: 08/24/16  1243     History   Chief Complaint Chief Complaint  Patient presents with  . Shortness of Breath  . coughing up blood    HPI Erica Jimenez is a 83 y.o. female.  83 yo F with a cc of hemoptysis. Patient was recently started on chemotherapy about a week ago. Coughed last night and had streaks in her sputum. Had a couple episodes today with the same. Denies frank blood. Denies chest pain shortness of breath. Has had some increased wheezing for the past couple days. Denies fevers or chills.   The history is provided by the patient.  Shortness of Breath  This is a new problem. The average episode lasts 2 days. The current episode started less than 1 hour ago. The problem has not changed since onset.Associated symptoms include cough. Pertinent negatives include no fever, no headaches, no rhinorrhea, no wheezing, no chest pain and no vomiting. She has tried nothing for the symptoms. The treatment provided no relief. Associated medical issues include chronic lung disease. Associated medical issues do not include PE.    Past Medical History:  Diagnosis Date  . A-V fistula ([place])    pt has 2  fistulas in rt arm  . Alopecia   . Anemia   . Anxiety   . Aortic stenosis   . Arthritis    gout  . Bladder cancer [facility])     s/p resection and BCG treatment.   Marland Kitchen CAD (coronary artery disease)     Pt is s/p anterior MI in 1996 with stent placed in LAD.  Last myoview in our office was in 7/05 and  showed apical anterior, apical, and apical inferior infarct.  Minimal peri-infarct ischemia.  EF 49%.   . Cardiomyopathy, ischemic   . CKD (chronic kidney disease)     Pt is stage III-IV.  She had a fistula placed but is not on dialysis.  She follows with Dr. Lorrene Reid for  nephrology.   Marland Kitchen COPD (chronic obstructive pulmonary disease) ([place])   . CVA (cerebral vascular accident) ([place])   . DM (diabetes mellitus) ([place])   . Full  dentures   . Gout   . History of glaucoma   . HOH (hard of hearing)    slightly  . HTN (hypertension)   . Hyperlipidemia   . Hypothyroidism   . Myocardial infarction ([place])   . PAD (peripheral artery disease) ([facility])     arterial doppler study 12/04 suggestive of > 50% bilateral SFA stenosis.  Pt has mild claudication.  No invasive evaluation given CKD and desire to avoid contrast use.   Marland Kitchen Shortness of breath dyspnea    on exertion  . Wears glasses     Patient Active Problem List   Diagnosis Date Noted  . Acute cerebrovascular accident (CVA) ([place]) 08/16/2016  . Type 2 diabetes mellitus with hyperlipidemia ([place]) 08/16/2016  . CKD (chronic kidney disease), stage III 08/16/2016  . Anemia 08/16/2016  . Stage IV squamous cell carcinoma of right lung ([place]) 08/14/2016  . Lung cancer ([place]) 08/12/2016  . Right lower lobe lung mass 07/21/2016  . Pulmonary infiltrates 05/20/2012  . Smoking 08/30/2010  . Aortic valve disorder 02/12/2009  . CARDIOMYOPATHY, ISCHEMIC 08/14/2008  . Hyperlipidemia 07/11/2008  . CAD, NATIVE VESSEL 07/11/2008  . BRADYCARDIA 07/11/2008  . Peripheral vascular disease ([place]) 07/11/2008  . CARDIAC MURMUR 07/11/2008  . CAROTID BRUIT 07/11/2008    Past  Surgical History:  Procedure Laterality Date  . ABDOMINAL AORTAGRAM N/A 08/17/2013   Procedure: ABDOMINAL Maxcine Ham;  Surgeon: Wellington Hampshire, MD;  Location: [facility] CATH [facility];  Service: Cardiovascular;  Laterality: N/A;  . ANGIOPLASTY    . CYSTOSCOPY/RETROGRADE/URETEROSCOPY Bilateral 01/09/2015   Procedure: CYSTOSCOPY BILATERAL RETROGRADE PYELOGRAM, WASHINGS RENAL PELVIS & BLADDER ;  Surgeon: Lowella Bandy, MD;  [facility];  Service: Urology;  Laterality: Bilateral;  . CYSTOSTOMY W/ BLADDER BIOPSY    . EYE SURGERY Bilateral    cataract w IOL    OB History    No data available       Home Medications    Prior to Admission medications   Medication Sig Start Date End Date Taking? Authorizing  Provider  ALPRAZolam (XANAX) 0.25 MG tablet Take 0.25 mg by mouth at bedtime.    Yes Historical Provider, MD  aspirin 325 MG tablet Take 1 tablet (325 mg total) by mouth daily. 08/19/16  Yes Dron Tanna Furry, MD  atorvastatin (LIPITOR) 80 MG tablet Take 80 mg by mouth daily after supper.    Yes Historical Provider, MD  bisoprolol (ZEBETA) 5 MG tablet take 1/2 tablet by mouth once daily **PLEASE KEEP UPCOMING APPOINTMENT FOR FURTHER REFILLS** 08/19/16  Yes Larey Dresser, MD  brinzolamide (AZOPT) 1 % ophthalmic suspension Place 1 drop into both eyes 2 (two) times daily.    Yes Historical Provider, MD  calcitRIOL (ROCALTROL) 0.25 MCG capsule Take 0.25 mcg by mouth every Monday, Wednesday, and Friday.    Yes Historical Provider, MD  Carboxymethylcellulose Sodium (REFRESH TEARS OP) Place 1 drop into both eyes 3 (three) times daily as needed (dry eyes). Both eyes.   Yes Historical Provider, MD  citalopram (CELEXA) 20 MG tablet Take 20 mg by mouth daily after supper.    Yes Historical Provider, MD  clopidogrel (PLAVIX) 75 MG tablet Take 75 mg by mouth daily after supper.    Yes Historical Provider, MD  febuxostat (ULORIC) 40 MG tablet Take 40 mg by mouth daily.   Yes Historical Provider, MD  Ferrous Gluconate (FERGON PO) Take 1 tablet by mouth daily after supper.    Yes Historical Provider, MD  folic acid (FOLVITE) 1 MG tablet Take 1 mg by mouth daily.   Yes Historical Provider, MD  furosemide (LASIX) 40 MG tablet Take 40 mg by mouth daily.    Yes Historical Provider, MD  hydrALAZINE (APRESOLINE) 50 MG tablet Take 1 tablet (50 mg total) by mouth 2 (two) times daily. 08/11/16  Yes Larey Dresser, MD  insulin glargine (LANTUS) 100 unit/mL SOPN Inject 12 Units into the skin daily before breakfast.   Yes Historical Provider, MD  insulin lispro (HUMALOG KWIKPEN) 100 UNIT/ML KiwkPen Inject 2-3 Units into the skin 3 (three) times daily before meals.   Yes Historical Provider, MD  isosorbide mononitrate (IMDUR)  60 MG 24 hr tablet Take 60 mg by mouth daily after supper.    Yes Historical Provider, MD  latanoprost (XALATAN) 0.005 % ophthalmic solution Place 1 drop into both eyes at bedtime.    Yes Historical Provider, MD  levothyroxine (SYNTHROID, LEVOTHROID) 50 MCG tablet Take 50 mcg by mouth daily after supper.    Yes Historical Provider, MD  lidocaine-prilocaine (EMLA) cream Apply 1 application topically as needed. Patient taking differently: Apply 1 application topically daily as needed (vaginal itching).  08/14/16  Yes Curt Bears, MD  mirtazapine (REMERON) 30 MG tablet Take 15 mg by mouth at bedtime. 06/18/16  Yes  Historical Provider, MD  Phenylephrine-APAP-Guaifenesin [facility] SINUS-MAX CONGESTION) 10-650-400 MG/20ML LIQD Take 20 mLs by mouth 2 (two) times daily as needed (scratchy throat). Per bottle as needed    Yes Historical Provider, MD  prochlorperazine (COMPAZINE) 10 MG tablet Take 1 tablet (10 mg total) by mouth every 6 (six) hours as needed for nausea or vomiting. 08/14/16  Yes Curt Bears, MD  senna (SENOKOT) 8.6 MG tablet Take 1 tablet by mouth daily as needed for constipation.    Yes Historical Provider, MD  nitroGLYCERIN (NITROSTAT) 0.4 MG SL tablet Place 1 tablet (0.4 mg total) under the tongue every 5 (five) minutes as needed for chest pain. 10/26/14   Larey Dresser, MD  predniSONE (DELTASONE) 20 MG tablet 2 tabs po daily x 4 days 08/24/16   Deno Etienne, DO    Family History Family History  Problem Relation Age of Onset  . Breast cancer Mother   . Tuberculosis Father   . Heart disease Maternal Aunt     Social History Social History  Substance Use Topics  . Smoking status: Former Smoker    Packs/day: 0.35    Years: 60.00    Types: Cigarettes    Quit date: 06/30/2016  . Smokeless tobacco: Never Used  . Alcohol use 0.6 oz/week    1 Shots of liquor per week     Comment: occasional     Allergies   Contrast media [iodinated diagnostic agents]; Fluoxetine; and Sulfonamide  derivatives   Review of Systems Review of Systems  Constitutional: Negative for chills and fever.  HENT: Negative for congestion and rhinorrhea.   Eyes: Negative for redness and visual disturbance.  Respiratory: Positive for cough and shortness of breath. Negative for wheezing.        Hemoptysis  Cardiovascular: Negative for chest pain and palpitations.  Gastrointestinal: Negative for nausea and vomiting.  Genitourinary: Negative for dysuria and urgency.  Musculoskeletal: Negative for arthralgias and myalgias.  Skin: Negative for pallor and wound.  Neurological: Negative for dizziness and headaches.     Physical Exam Updated Vital Signs BP (!) 153/58   Pulse (!) 55   Temp 98.9 F (37.2 C) (Oral)   Resp (!) 21   Ht 5' 4.5" (1.638 m)   Wt 143 lb (64.9 kg)   SpO2 95%   BMI 24.17 kg/m   Physical Exam  Constitutional: She is oriented to person, place, and time. She appears well-developed and well-nourished. No distress.  HENT:  Head: Normocephalic and atraumatic.  Eyes: EOM are normal. Pupils are equal, round, and reactive to light.  Neck: Normal range of motion. Neck supple.  Cardiovascular: Normal rate and regular rhythm.  Exam reveals no gallop and no friction rub.   No murmur heard. Pulmonary/Chest: Effort normal. She has no wheezes. She has no rales.  Abdominal: Soft. She exhibits no distension and no mass. There is no tenderness. There is no guarding.  Musculoskeletal: She exhibits no edema or tenderness.  Neurological: She is alert and oriented to person, place, and time.  Skin: Skin is warm and dry. She is not diaphoretic.  Psychiatric: She has a normal mood and affect. Her behavior is normal.  Nursing note and vitals reviewed.    ED Treatments / Results  Labs (all labs ordered are listed, but only abnormal results are displayed) Labs Reviewed  CBC - Abnormal; Notable for the following:       Result Value   WBC 12.9 (*)    RBC 3.72 (*)  Hemoglobin 10.9  (*)    HCT 33.9 (*)    RDW 16.7 (*)    All other components within normal limits  COMPREHENSIVE METABOLIC PANEL - Abnormal; Notable for the following:    Glucose, Bld 168 (*)    BUN 54 (*)    Creatinine, Ser 2.02 (*)    Calcium 8.7 (*)    Albumin 3.4 (*)    ALT 10 (*)    GFR calc non Af Amer 21 (*)    GFR calc Af Amer 25 (*)    All other components within normal limits  I-STAT TROPOININ, ED    EKG  EKG Interpretation None       Radiology Dg Chest 2 View  Result Date: 08/24/2016 CLINICAL DATA:  Patient with history of lung cancer.  Hemoptysis. EXAM: CHEST  2 VIEW COMPARISON:  Chest radiograph 08/17/2016 FINDINGS: Monitoring leads overlie the patient. Stable enlarged cardiac and mediastinal contours. Aortic atherosclerosis. Opacities within the right upper and right lower lobes compatible with known lesions. Pulmonary hyperinflation. No pleural effusion or pneumothorax. Thoracic spine degenerative changes. Aortic atherosclerosis. IMPRESSION: Cardiomegaly.  Aortic atherosclerosis. Known multifocal pulmonary malignancy within the right lung. Electronically Signed   By: Lovey Newcomer M.D.   On: 08/24/2016 13:54    Procedures Procedures (including critical care time)  Medications Ordered in ED Medications  ipratropium-albuterol (DUONEB) 0.5-2.5 (3) MG/3ML nebulizer solution 3 mL (3 mLs Nebulization Given 08/24/16 1411)  albuterol (PROVENTIL) (2.5 MG/3ML) 0.083% nebulizer solution 5 mg (5 mg Nebulization Given 08/24/16 1306)  predniSONE (DELTASONE) tablet 60 mg (60 mg Oral Given 08/24/16 1410)     Initial Impression / Assessment and Plan / ED Course  I have reviewed the triage vital signs and the nursing notes.  Pertinent labs & imaging results that were available during my care of the patient were reviewed by me and considered in my medical decision making (see chart for details).     83 yo F With a chief complaint of hemoptysis. Patient having diffuse wheezes on exam.  Improvement with DuoNeb's and steroids. Chest x-ray with no acute process. I discussed the patient that hemoptysis is sometimes worrying especially about possible PE or bleeding caused by her cancer. We debated the utility of CT scan at this time. Currently she electing for discharge home and she feels better post breathing treatments and will return for worsening symptoms.  3:33 PM:  I have discussed the diagnosis/risks/treatment options with the patient and family and believe the pt to be eligible for discharge home to follow-up with Heme Onc. We also discussed returning to the ED immediately if new or worsening sx occur. We discussed the sx which are most concerning (e.g., sudden worsening sob, chest pain, worsening hemoptysis, exertional symptoms) that necessitate immediate return. Medications administered to the patient during their visit and any new prescriptions provided to the patient are listed below.  Medications given during this visit Medications  ipratropium-albuterol (DUONEB) 0.5-2.5 (3) MG/3ML nebulizer solution 3 mL (3 mLs Nebulization Given 08/24/16 1411)  albuterol (PROVENTIL) (2.5 MG/3ML) 0.083% nebulizer solution 5 mg (5 mg Nebulization Given 08/24/16 1306)  predniSONE (DELTASONE) tablet 60 mg (60 mg Oral Given 08/24/16 1410)     The patient appears reasonably screen and/or stabilized for discharge and I doubt any other medical condition or other [facility] requiring further screening, evaluation, or treatment in the ED at this time prior to discharge.    Final Clinical Impressions(s) / ED Diagnoses   Final diagnoses:  COPD exacerbation ([place])  Hemoptysis    New Prescriptions Discharge Medication List as of 08/24/2016  3:08 PM    START taking these medications   Details  predniSONE (DELTASONE) 20 MG tablet 2 tabs po daily x 4 days, Print         Deno Etienne, DO 08/24/16 1533

## 2016-08-24 NOTE — Discharge Instructions (Signed)
Follow up with your oncologist, return for worsening symptoms

## 2016-08-24 NOTE — ED Notes (Signed)
ED Provider at bedside. 

## 2016-08-24 NOTE — ED Triage Notes (Signed)
Patient states that she had first chemo treatment on Thursday for lung cancer and yesterday started coughing up blood. And SOB that patient reports had intermittently over the past week got worse today.

## 2016-08-25 NOTE — Telephone Encounter (Signed)
Recent ED visit for hemoptysis. Now on prednisone. She said the hemoptysis is less. She ate " a pretty good breakfast and 10 oz of water" I told her to continue increasing fluids and to call if she has any worsening hemoptysis or new symptoms. She appreciated the call. She said her daughter is on her way to her house now.

## 2016-08-26 ENCOUNTER — Other Ambulatory Visit (HOSPITAL_BASED_OUTPATIENT_CLINIC_OR_DEPARTMENT_OTHER): Payer: Medicare Other

## 2016-08-26 ENCOUNTER — Ambulatory Visit (HOSPITAL_BASED_OUTPATIENT_CLINIC_OR_DEPARTMENT_OTHER): Payer: Medicare Other | Admitting: Internal Medicine

## 2016-08-26 ENCOUNTER — Telehealth: Payer: Self-pay | Admitting: Internal Medicine

## 2016-08-26 ENCOUNTER — Encounter: Payer: Self-pay | Admitting: Internal Medicine

## 2016-08-26 VITALS — BP 144/47 | HR 57 | Temp 98.4°F | Resp 18 | Ht 64.5 in | Wt 140.1 lb

## 2016-08-26 DIAGNOSIS — C3491 Malignant neoplasm of unspecified part of right bronchus or lung: Secondary | ICD-10-CM

## 2016-08-26 DIAGNOSIS — Z7189 Other specified counseling: Secondary | ICD-10-CM

## 2016-08-26 DIAGNOSIS — C3431 Malignant neoplasm of lower lobe, right bronchus or lung: Secondary | ICD-10-CM

## 2016-08-26 DIAGNOSIS — R05 Cough: Secondary | ICD-10-CM | POA: Diagnosis not present

## 2016-08-26 DIAGNOSIS — Z5111 Encounter for antineoplastic chemotherapy: Secondary | ICD-10-CM

## 2016-08-26 DIAGNOSIS — R5383 Other fatigue: Secondary | ICD-10-CM

## 2016-08-26 LAB — COMPREHENSIVE METABOLIC PANEL
ALK PHOS: 79 U/L (ref 40–150)
ALT: 32 U/L (ref 0–55)
AST: 58 U/L — ABNORMAL HIGH (ref 5–34)
Albumin: 3.1 g/dL — ABNORMAL LOW (ref 3.5–5.0)
Anion Gap: 11 mEq/L (ref 3–11)
BILIRUBIN TOTAL: 0.37 mg/dL (ref 0.20–1.20)
BUN: 69.2 mg/dL — ABNORMAL HIGH (ref 7.0–26.0)
CALCIUM: 9.2 mg/dL (ref 8.4–10.4)
CO2: 19 meq/L — AB (ref 22–29)
CREATININE: 2.3 mg/dL — AB (ref 0.6–1.1)
Chloride: 106 mEq/L (ref 98–109)
EGFR: 22 mL/min/{1.73_m2} — ABNORMAL LOW (ref >90)
Glucose: 356 mg/dl — ABNORMAL HIGH (ref 70–140)
Potassium: 5.4 mEq/L — ABNORMAL HIGH (ref 3.5–5.1)
Sodium: 136 mEq/L (ref 136–145)
TOTAL PROTEIN: 7.1 g/dL (ref 6.4–8.3)

## 2016-08-26 LAB — CBC WITH DIFFERENTIAL/PLATELET
BASO%: 1.8 % (ref 0.0–2.0)
BASOS ABS: 0.1 10*3/uL (ref 0.0–0.1)
EOS ABS: 0.1 10*3/uL (ref 0.0–0.5)
EOS%: 1.5 % (ref 0.0–7.0)
HEMATOCRIT: 40.1 % (ref 34.8–46.6)
HGB: 12.1 g/dL (ref 11.6–15.9)
LYMPH%: 12.8 % — AB (ref 14.0–49.7)
MCH: 27.4 pg (ref 25.1–34.0)
MCHC: 30.2 g/dL — AB (ref 31.5–36.0)
MCV: 90.7 fL (ref 79.5–101.0)
MONO#: 0.1 10*3/uL (ref 0.1–0.9)
MONO%: 1.8 % (ref 0.0–14.0)
NEUT#: 2.7 10*3/uL (ref 1.5–6.5)
NEUT%: 82.1 % — ABNORMAL HIGH (ref 38.4–76.8)
NRBC: 0 % (ref 0–0)
Platelets: 137 10*3/uL — ABNORMAL LOW (ref 145–400)
RBC: 4.42 10*6/uL (ref 3.70–5.45)
RDW: 16.8 % — ABNORMAL HIGH (ref 11.2–14.5)
WBC: 3.3 10*3/uL — ABNORMAL LOW (ref 3.9–10.3)
lymph#: 0.4 10*3/uL — ABNORMAL LOW (ref 0.9–3.3)

## 2016-08-26 NOTE — Telephone Encounter (Signed)
Gave patient AVS and calender per 4/24 los.

## 2016-08-26 NOTE — Progress Notes (Signed)
[place] [phone]   [phone]  OFFICE PROGRESS NOTE  PATERSON, DANIEL (Inactive) [place]  DIAGNOSIS: stage IIIB/IV non-small cell lung cancer, squamous cell carcinoma with PDL 1 expression of 15% diagnosed in March 2018 and presented with multiple masses in the right upper and lower lobes in addition to mediastinal lymphadenopathy and questionable nodule in the left upper lobe.  PRIOR THERAPY: None  CURRENT THERAPY: Palliative systemic chemotherapy with carboplatin for AUC of 5 and paclitaxel 175 MG/M2 every 3 weeks with Neulasta support. Status post one cycle.   INTERVAL HISTORY: Erica Jimenez 83 y.o. female returns to the clinic today for follow-up visit accompanied by her daughter. The patient tolerated the first week of her treatment well except for generalized fatigue. She continues to have cough productive of clear sputum and shortness ofbreath with exertion. She denied having any fever or chills. She has no nausea, vomiting, diarrhea or constipation. She was started recently on Plavix and aspirin for the acute stroke. She denied having any significant weight loss or night sweats. She has no headache or visual changes. She is here today for evaluation with repeat blood work.  MEDICAL HISTORY: Past Medical History:  Diagnosis Date  . A-V fistula ([place])    pt has 2  fistulas in rt arm  . Alopecia   . Anemia   . Anxiety   . Aortic stenosis   . Arthritis    gout  . Bladder cancer [facility])     s/p resection and BCG treatment.   Marland Kitchen CAD (coronary artery disease)     Pt is s/p anterior MI in 1996 with stent placed in LAD.  Last myoview in our office was in 7/05 and  showed apical anterior, apical, and apical inferior infarct.  Minimal peri-infarct ischemia.  EF 49%.   . Cardiomyopathy, ischemic   . CKD (chronic kidney disease)     Pt is stage III-IV.  She had a fistula placed but is not on dialysis.  She follows with Dr. Lorrene Reid for  nephrology.     Marland Kitchen COPD (chronic obstructive pulmonary disease) ([place])   . CVA (cerebral vascular accident) ([place])   . DM (diabetes mellitus) ([place])   . Full dentures   . Gout   . History of glaucoma   . HOH (hard of hearing)    slightly  . HTN (hypertension)   . Hyperlipidemia   . Hypothyroidism   . Myocardial infarction ([place])   . PAD (peripheral artery disease) ([facility])     arterial doppler study 12/04 suggestive of > 50% bilateral SFA stenosis.  Pt has mild claudication.  No invasive evaluation given CKD and desire to avoid contrast use.   Marland Kitchen Shortness of breath dyspnea    on exertion  . Wears glasses     ALLERGIES:  is allergic to contrast media [iodinated diagnostic agents]; fluoxetine; and sulfonamide derivatives.  MEDICATIONS:  Current Outpatient Prescriptions  Medication Sig Dispense Refill  . ALPRAZolam (XANAX) 0.25 MG tablet Take 0.25 mg by mouth at bedtime.     Marland Kitchen aspirin 325 MG tablet Take 1 tablet (325 mg total) by mouth daily. 30 tablet 0  . atorvastatin (LIPITOR) 80 MG tablet Take 80 mg by mouth daily after supper.     . bisoprolol (ZEBETA) 5 MG tablet take 1/2 tablet by mouth once daily **PLEASE KEEP UPCOMING APPOINTMENT FOR FURTHER REFILLS** 15 tablet 0  . brinzolamide (AZOPT) 1 % ophthalmic suspension Place 1 drop into both  eyes 2 (two) times daily.     . calcitRIOL (ROCALTROL) 0.25 MCG capsule Take 0.25 mcg by mouth every Monday, Wednesday, and Friday.     . Carboxymethylcellulose Sodium (REFRESH TEARS OP) Place 1 drop into both eyes 3 (three) times daily as needed (dry eyes). Both eyes.    . citalopram (CELEXA) 20 MG tablet Take 20 mg by mouth daily after supper.     . clopidogrel (PLAVIX) 75 MG tablet Take 75 mg by mouth daily after supper.     . febuxostat (ULORIC) 40 MG tablet Take 40 mg by mouth daily.    . Ferrous Gluconate (FERGON PO) Take 1 tablet by mouth daily after supper.     . folic acid (FOLVITE) 1 MG tablet Take 1 mg by mouth daily.    . furosemide (LASIX) 40 MG tablet  Take 40 mg by mouth daily.     . hydrALAZINE (APRESOLINE) 50 MG tablet Take 1 tablet (50 mg total) by mouth 2 (two) times daily. 60 tablet 0  . insulin glargine (LANTUS) 100 unit/mL SOPN Inject 12 Units into the skin daily before breakfast.    . insulin lispro (HUMALOG KWIKPEN) 100 UNIT/ML KiwkPen Inject 2-3 Units into the skin 3 (three) times daily before meals.    . isosorbide mononitrate (IMDUR) 60 MG 24 hr tablet Take 60 mg by mouth daily after supper.     . latanoprost (XALATAN) 0.005 % ophthalmic solution Place 1 drop into both eyes at bedtime.     Marland Kitchen levothyroxine (SYNTHROID, LEVOTHROID) 50 MCG tablet Take 50 mcg by mouth daily after supper.     . lidocaine-prilocaine (EMLA) cream Apply 1 application topically as needed. (Patient taking differently: Apply 1 application topically daily as needed (vaginal itching). ) 30 g 0  . mirtazapine (REMERON) 30 MG tablet Take 15 mg by mouth at bedtime.  0  . Phenylephrine-APAP-Guaifenesin (MUCINEX SINUS-MAX CONGESTION) 10-650-400 MG/20ML LIQD Take 20 mLs by mouth 2 (two) times daily as needed (scratchy throat). Per bottle as needed     . predniSONE (DELTASONE) 20 MG tablet 2 tabs po daily x 4 days 8 tablet 0  . prochlorperazine (COMPAZINE) 10 MG tablet Take 1 tablet (10 mg total) by mouth every 6 (six) hours as needed for nausea or vomiting. 30 tablet 0  . senna (SENOKOT) 8.6 MG tablet Take 1 tablet by mouth daily as needed for constipation.     . nitroGLYCERIN (NITROSTAT) 0.4 MG SL tablet Place 1 tablet (0.4 mg total) under the tongue every 5 (five) minutes as needed for chest pain. (Patient not taking: Reported on 08/26/2016) 25 tablet 3   No current facility-administered medications for this visit.     SURGICAL HISTORY:  Past Surgical History:  Procedure Laterality Date  . ABDOMINAL AORTAGRAM N/A 08/17/2013   Procedure: ABDOMINAL Maxcine Ham;  Surgeon: Wellington Hampshire, MD;  Location: [place] CATH LAB;  Service: Cardiovascular;  Laterality: N/A;  .  ANGIOPLASTY    . CYSTOSCOPY/RETROGRADE/URETEROSCOPY Bilateral 01/09/2015   Procedure: CYSTOSCOPY BILATERAL RETROGRADE PYELOGRAM, WASHINGS RENAL PELVIS & BLADDER ;  Surgeon: Lowella Bandy, MD;  Location: [facility];  Service: Urology;  Laterality: Bilateral;  . CYSTOSTOMY W/ BLADDER BIOPSY    . EYE SURGERY Bilateral    cataract w IOL    REVIEW OF SYSTEMS:  A comprehensive review of systems was negative except for: Constitutional: positive for fatigue Respiratory: positive for cough and dyspnea on exertion Musculoskeletal: positive for muscle weakness   PHYSICAL EXAMINATION: General appearance: alert,  cooperative, fatigued and no distress Head: Normocephalic, without obvious abnormality, atraumatic Neck: no adenopathy, no JVD, supple, symmetrical, trachea midline and thyroid not enlarged, symmetric, no tenderness/mass/nodules Lymph nodes: Cervical, supraclavicular, and axillary nodes normal. Resp: clear to auscultation bilaterally Back: symmetric, no curvature. ROM normal. No CVA tenderness. Cardio: regular rate and rhythm, S1, S2 normal, no murmur, click, rub or gallop GI: soft, non-tender; bowel sounds normal; no masses,  no organomegaly Extremities: extremities normal, atraumatic, no cyanosis or edema  ECOG PERFORMANCE STATUS: 1 - Symptomatic but completely ambulatory  Blood pressure (!) 144/47, pulse (!) 57, temperature 98.4 F (36.9 C), temperature source Oral, resp. rate 18, height 5' 4.5" (1.638 m), weight 140 lb 1.6 oz (63.5 kg), SpO2 100 %.  LABORATORY DATA: Lab Results  Component Value Date   WBC 3.3 (L) 08/26/2016   HGB 12.1 08/26/2016   HCT 40.1 08/26/2016   MCV 90.7 08/26/2016   PLT 137 (L) 08/26/2016      Chemistry      Component Value Date/Time   NA 136 08/26/2016 1456   K 5.4 (H) 08/26/2016 1456   CL 105 08/24/2016 1323   CO2 19 (L) 08/26/2016 1456   BUN 69.2 (H) 08/26/2016 1456   CREATININE 2.3 (H) 08/26/2016 1456      Component Value Date/Time     CALCIUM 9.2 08/26/2016 1456   ALKPHOS 79 08/26/2016 1456   AST 58 (H) 08/26/2016 1456   ALT 32 08/26/2016 1456   BILITOT 0.37 08/26/2016 1456       RADIOGRAPHIC STUDIES: Dg Chest 2 View  Result Date: 08/24/2016 CLINICAL DATA:  Patient with history of lung cancer.  Hemoptysis. EXAM: CHEST  2 VIEW COMPARISON:  Chest radiograph 08/17/2016 FINDINGS: Monitoring leads overlie the patient. Stable enlarged cardiac and mediastinal contours. Aortic atherosclerosis. Opacities within the right upper and right lower lobes compatible with known lesions. Pulmonary hyperinflation. No pleural effusion or pneumothorax. Thoracic spine degenerative changes. Aortic atherosclerosis. IMPRESSION: Cardiomegaly.  Aortic atherosclerosis. Known multifocal pulmonary malignancy within the right lung. Electronically Signed   By: Lovey Newcomer M.D.   On: 08/24/2016 13:54   Dg Chest 2 View  Result Date: 08/17/2016 CLINICAL DATA:  Stage IV lung cancer. Acute infarct of the left temporal lobe. EXAM: CHEST  2 VIEW COMPARISON:  CT of the chest 07/30/2016. FINDINGS: The heart is mildly enlarged. Atherosclerotic calcifications are present at the aortic arch. Right lower lobe and upper lobe opacities compatible with known mass lesions. Mild chronic interstitial coarsening is present as well. Bibasilar atelectasis is present. There are no significant effusions. The visualized soft tissues and bony thorax are unremarkable. IMPRESSION: 1. Multifocal neoplasm in the right upper lower lobes. 2. Aortic atherosclerosis. 3. No superimposed acute disease. Electronically Signed   By: San Morelle M.D.   On: 08/17/2016 08:05   [name] Contrast  Result Date: 08/16/2016 CLINICAL DATA:  Stage IV squamous cell carcinoma of the Lung EXAM: MRI HEAD WITHOUT CONTRAST TECHNIQUE: Multiplanar, multiecho pulse sequences of the brain and surrounding structures were obtained without intravenous contrast. COMPARISON:  CT head without contrast  05/11/2010. FINDINGS: Brain: A 4 mm acute nonhemorrhagic white matter infarct is present in the left temporal lobe adjacent to the left lateral ventricle. No other acute infarcts are present. Remote bilateral parietal lobe infarcts are present. Advanced atrophy and white matter disease is again seen bilaterally. Remote occipital lobe infarcts are present. A remote infarct is present in the anterior left frontal lobe. No focal mass lesion is present.  The ventricles are proportionate to the degree of atrophy with areas of ex vacuo dilation. White matter changes extend into the brainstem. Left greater than right remote cerebellar infarcts are present. No acute hemorrhage is present. No significant extraaxial fluid collection is present. Vascular: Flow is present in the major intracranial arteries. Skull and upper cervical spine: The skullbase is within normal limits. The craniocervical junction is normal. Midline sagittal structures are within normal limits. The upper cervical spine is unremarkable. Marrow signal is normal. Sinuses/Orbits: Mid left ethmoid air cells are opacified. The remaining paranasal sinuses and mastoid air cells are clear. Bilateral lens replacements are present. The globes and orbits are otherwise within normal limits. IMPRESSION: 1. Acute nonhemorrhagic 4 mm white matter infarct in the left temporal lobe. 2. Remote cortical infarcts bilaterally involving both parietal lobes, both occipital lobes, in the anterior left frontal lobe. 3. Remote white matter infarcts involving the left greater than right cerebellum. 4. Minimal left ethmoid sinus disease. These results were called by telephone at the time of interpretation on 08/16/2016 at 4:37 pm to Dr. Curt Bears , who verbally acknowledged these results. Dr. [name] asked that the patient be directed to the [facility] Emergency Department for further evaluation. The MRI technologist communicated this to the patient who agreed to proceed to the  [facility] Emergency Department. Electronically Signed   By: San Morelle M.D.   On: 08/16/2016 16:39   Nm Pet Image Initial (pi) Skull Base To Thigh  Result Date: 07/30/2016 CLINICAL DATA:  Initial treatment strategy for lung mass. EXAM: NUCLEAR MEDICINE PET SKULL BASE TO THIGH TECHNIQUE: 6.8 mCi F-18 FDG was injected intravenously. Full-ring PET imaging was performed from the skull base to thigh after the radiotracer. CT data was obtained and used for attenuation correction and anatomic localization. FASTING BLOOD GLUCOSE:  Value: 162 mg/dl COMPARISON:  07/08/2016 FINDINGS: NECK No hypermetabolic lymph nodes in the neck. CHEST The heart size appears mildly enlarged. No pericardial effusion. Aortic atherosclerosis. Calcification in the RCA and LAD coronary artery noted. Right paratracheal lymph node measures 1.6 cm and has an SUV max equal to 5.95. Enlarged right hilar lymph node measures 1.6 cm and has an SUV max equal to 9.8, image 66 of series 4. Right upper lobe hypermetabolic mass measures 3.1 cm and has an SUV max equal to 6.8. There is evidence of endobronchial spread of tumor into the right upper lobe and right apex. Nodule in the right apex measures 3.3 cm and has an SUV max equal to 6.9. There is a hypermetabolic mass within the superior segment of the right lower lobe which measures 4.3 cm and has an SUV max equal to 14.69. There is postobstructive pneumonitis involving the right lung base. Small nodule within the left apex measures 5 mm and is too small to reliably characterize. Calcified granuloma noted in the right lung base. ABDOMEN/PELVIS No abnormal hypermetabolic activity within the liver, pancreas, adrenal glands, or spleen. No hypermetabolic lymph nodes in the abdomen or pelvis. SKELETON No focal hypermetabolic activity to suggest skeletal metastasis. IMPRESSION: 1. Multifocal hypermetabolic lesions are identified within the right upper lobe and right lower lobe. There is also  hypermetabolic right hilar adenopathy and right paratracheal adenopathy. Assuming non-small cell histology imaging findings would be compatible with T4N2M0 or stage IIIb disease. 2. Aortic atherosclerosis. Multi vessel coronary artery calcifications noted. Electronically Signed   By: Kerby Moors M.D.   On: 07/30/2016 14:47   Ct Biopsy  Result Date: 07/28/2016 INDICATION: Right lower  lobe lung mass EXAM: CT BIOPSY MEDICATIONS: None. ANESTHESIA/SEDATION: Fentanyl 50 mcg IV; Versed 1.5 mg IV Moderate Sedation Time:  11 The patient was continuously monitored during the procedure by the interventional radiology nurse under my direct supervision. FLUOROSCOPY TIME:  None. COMPLICATIONS: None immediate. PROCEDURE: Informed written consent was obtained from the patient after a thorough discussion of the procedural risks, benefits and alternatives. All questions were addressed. Maximal Sterile Barrier Technique was utilized including caps, mask, sterile gowns, sterile gloves, sterile drape, hand hygiene and skin antiseptic. A timeout was performed prior to the initiation of the procedure. Under CT guidance, a(n) 17 gauge guide needle was advanced into the right lower lobe lung mass. Subsequently 3 18 gauge core biopsies were obtained. The guide needle was removed. Post biopsy images demonstrate no pneumothorax. Patient tolerated the procedure well without complication. Vital sign monitoring by nursing staff during the procedure will continue as patient is in the special procedures unit for post procedure observation. FINDINGS: The images document guide needle placement within the right lower lobe lung mass. Post biopsy images demonstrate no pneumothorax. IMPRESSION: Successful CT-guided right lower lobe lung mass biopsy. Electronically Signed   By: Marybelle Killings M.D.   On: 07/28/2016 14:04   Dg Chest Port 1 View  Result Date: 07/28/2016 CLINICAL DATA:  Biopsy EXAM: PORTABLE CHEST 1 VIEW COMPARISON:  07/28/2016  FINDINGS: There is no pneumothorax after right lower lobe lung biopsy. Cardiomegaly persists. Right apical lung mass persists. Central right basilar lung mass and associated right lower lobe opacity is stable. Left lung is relatively clear. IMPRESSION: No pneumothorax post right lung biopsy. Electronically Signed   By: Marybelle Killings M.D.   On: 07/28/2016 13:57   Mr Jodene Nam Head/brain MW Cm  Result Date: 08/17/2016 CLINICAL DATA:  TIA.  History of lung cancer. EXAM: MRA HEAD WITHOUT CONTRAST TECHNIQUE: Angiographic images of the Circle of Willis were obtained using MRA technique without intravenous contrast. COMPARISON:  Brain MRI 08/16/2016 FINDINGS: Intracranial internal carotid arteries: There is mild irregular narrowing of the right internal carotid artery lacerum and cavernous segments. There is approximately 50% narrowing of the left internal carotid artery lacerum segment (series 3 image 87 Anterior cerebral arteries: Normal. Middle cerebral arteries: Normal. Posterior communicating arteries: Absent bilaterally. Posterior cerebral arteries: Normal. Basilar artery: Normal. Vertebral arteries: Left dominant. Normal. Superior cerebellar arteries: Normal. Anterior inferior cerebellar arteries: Normal. Posterior inferior cerebellar arteries: Normal. IMPRESSION: 1. No intracranial arterial occlusion or high-grade stenosis. 2. Mild atherosclerotic narrowing of both intracranial internal carotid arteries. Electronically Signed   By: Ulyses Jarred M.D.   On: 08/17/2016 06:30    ASSESSMENT AND PLAN: This is a very pleasant 83 years old African-American female with stage IV non-small cell lung cancer, squamous cell carcinoma. The patient is currently undergoing systemic chemotherapy with reduced dose carboplatin for AUC of 5 and paclitaxel 175 MG/M2 every 3 weeks with Neulasta support status post 1 cycle. She tolerated the first week of her treatment well except for the generalized fatigue. Her blood work today did  not show significant pancytopenia. I recommended for the patient to continue her treatment with carboplatin and paclitaxel as a scheduled and she will would come back for follow-up visit in 2 weeks for evaluation before starting cycle #2. She was advised to call immediately if she has any concerning symptoms in the interval. The patient voices understanding of current disease status and treatment options and is in agreement with the current care plan. All questions were answered. The patient  knows to call the clinic with any problems, questions or concerns. We can certainly see the patient much sooner if necessary. I spent 10 minutes counseling the patient face to face. The total time spent in the appointment was 15 minutes. Disclaimer: This note was dictated with voice recognition software. Similar sounding words can inadvertently be transcribed and may not be corrected upon review.

## 2016-08-27 ENCOUNTER — Inpatient Hospital Stay (HOSPITAL_COMMUNITY): Admission: RE | Admit: 2016-08-27 | Payer: Medicare Other | Source: Ambulatory Visit

## 2016-08-27 ENCOUNTER — Ambulatory Visit (HOSPITAL_COMMUNITY): Payer: Medicare Other

## 2016-08-28 NOTE — Progress Notes (Signed)
Reviewed & agree with plan  

## 2016-09-01 ENCOUNTER — Emergency Department (HOSPITAL_COMMUNITY): Payer: Medicare Other

## 2016-09-01 ENCOUNTER — Inpatient Hospital Stay (HOSPITAL_COMMUNITY)
Admission: EM | Admit: 2016-09-01 | Discharge: 2016-09-08 | DRG: 871 | Disposition: A | Discharge status: Hospice | Payer: Medicare Other | Attending: Family Medicine | Admitting: Family Medicine

## 2016-09-01 ENCOUNTER — Encounter (HOSPITAL_COMMUNITY): Payer: Self-pay

## 2016-09-01 DIAGNOSIS — J440 Chronic obstructive pulmonary disease with acute lower respiratory infection: Secondary | ICD-10-CM | POA: Diagnosis present

## 2016-09-01 DIAGNOSIS — N184 Chronic kidney disease, stage 4 (severe): Secondary | ICD-10-CM | POA: Diagnosis present

## 2016-09-01 DIAGNOSIS — J189 Pneumonia, unspecified organism: Secondary | ICD-10-CM | POA: Diagnosis not present

## 2016-09-01 DIAGNOSIS — Z955 Presence of coronary angioplasty implant and graft: Secondary | ICD-10-CM

## 2016-09-01 DIAGNOSIS — R918 Other nonspecific abnormal finding of lung field: Secondary | ICD-10-CM | POA: Diagnosis not present

## 2016-09-01 DIAGNOSIS — D72829 Elevated white blood cell count, unspecified: Secondary | ICD-10-CM

## 2016-09-01 DIAGNOSIS — I959 Hypotension, unspecified: Secondary | ICD-10-CM | POA: Diagnosis present

## 2016-09-01 DIAGNOSIS — H9190 Unspecified hearing loss, unspecified ear: Secondary | ICD-10-CM | POA: Diagnosis present

## 2016-09-01 DIAGNOSIS — E039 Hypothyroidism, unspecified: Secondary | ICD-10-CM | POA: Diagnosis present

## 2016-09-01 DIAGNOSIS — C3411 Malignant neoplasm of upper lobe, right bronchus or lung: Secondary | ICD-10-CM | POA: Diagnosis not present

## 2016-09-01 DIAGNOSIS — Z452 Encounter for adjustment and management of vascular access device: Secondary | ICD-10-CM | POA: Diagnosis not present

## 2016-09-01 DIAGNOSIS — J181 Lobar pneumonia, unspecified organism: Secondary | ICD-10-CM | POA: Diagnosis present

## 2016-09-01 DIAGNOSIS — R197 Diarrhea, unspecified: Secondary | ICD-10-CM | POA: Diagnosis present

## 2016-09-01 DIAGNOSIS — A419 Sepsis, unspecified organism: Secondary | ICD-10-CM | POA: Diagnosis not present

## 2016-09-01 DIAGNOSIS — Z515 Encounter for palliative care: Secondary | ICD-10-CM | POA: Diagnosis not present

## 2016-09-01 DIAGNOSIS — Z66 Do not resuscitate: Secondary | ICD-10-CM | POA: Diagnosis not present

## 2016-09-01 DIAGNOSIS — N183 Chronic kidney disease, stage 3 (moderate): Secondary | ICD-10-CM | POA: Diagnosis not present

## 2016-09-01 DIAGNOSIS — D62 Acute posthemorrhagic anemia: Secondary | ICD-10-CM | POA: Diagnosis present

## 2016-09-01 DIAGNOSIS — E1122 Type 2 diabetes mellitus with diabetic chronic kidney disease: Secondary | ICD-10-CM | POA: Diagnosis present

## 2016-09-01 DIAGNOSIS — K250 Acute gastric ulcer with hemorrhage: Secondary | ICD-10-CM

## 2016-09-01 DIAGNOSIS — E785 Hyperlipidemia, unspecified: Secondary | ICD-10-CM | POA: Diagnosis not present

## 2016-09-01 DIAGNOSIS — Z8673 Personal history of transient ischemic attack (TIA), and cerebral infarction without residual deficits: Secondary | ICD-10-CM

## 2016-09-01 DIAGNOSIS — I130 Hypertensive heart and chronic kidney disease with heart failure and stage 1 through stage 4 chronic kidney disease, or unspecified chronic kidney disease: Secondary | ICD-10-CM | POA: Diagnosis present

## 2016-09-01 DIAGNOSIS — Z8551 Personal history of malignant neoplasm of bladder: Secondary | ICD-10-CM

## 2016-09-01 DIAGNOSIS — C3431 Malignant neoplasm of lower lobe, right bronchus or lung: Secondary | ICD-10-CM | POA: Diagnosis not present

## 2016-09-01 DIAGNOSIS — Z7902 Long term (current) use of antithrombotics/antiplatelets: Secondary | ICD-10-CM

## 2016-09-01 DIAGNOSIS — E1151 Type 2 diabetes mellitus with diabetic peripheral angiopathy without gangrene: Secondary | ICD-10-CM | POA: Diagnosis present

## 2016-09-01 DIAGNOSIS — I4891 Unspecified atrial fibrillation: Secondary | ICD-10-CM | POA: Diagnosis present

## 2016-09-01 DIAGNOSIS — F329 Major depressive disorder, single episode, unspecified: Secondary | ICD-10-CM | POA: Diagnosis present

## 2016-09-01 DIAGNOSIS — B3781 Candidal esophagitis: Secondary | ICD-10-CM | POA: Diagnosis not present

## 2016-09-01 DIAGNOSIS — D696 Thrombocytopenia, unspecified: Secondary | ICD-10-CM | POA: Diagnosis not present

## 2016-09-01 DIAGNOSIS — M79609 Pain in unspecified limb: Secondary | ICD-10-CM | POA: Diagnosis not present

## 2016-09-01 DIAGNOSIS — Z91041 Radiographic dye allergy status: Secondary | ICD-10-CM

## 2016-09-01 DIAGNOSIS — I2510 Atherosclerotic heart disease of native coronary artery without angina pectoris: Secondary | ICD-10-CM | POA: Diagnosis present

## 2016-09-01 DIAGNOSIS — E440 Moderate protein-calorie malnutrition: Secondary | ICD-10-CM | POA: Insufficient documentation

## 2016-09-01 DIAGNOSIS — K2960 Other gastritis without bleeding: Secondary | ICD-10-CM | POA: Diagnosis present

## 2016-09-01 DIAGNOSIS — K921 Melena: Secondary | ICD-10-CM | POA: Diagnosis not present

## 2016-09-01 DIAGNOSIS — Z7189 Other specified counseling: Secondary | ICD-10-CM | POA: Diagnosis not present

## 2016-09-01 DIAGNOSIS — I252 Old myocardial infarction: Secondary | ICD-10-CM | POA: Diagnosis not present

## 2016-09-01 DIAGNOSIS — N179 Acute kidney failure, unspecified: Secondary | ICD-10-CM | POA: Diagnosis not present

## 2016-09-01 DIAGNOSIS — I350 Nonrheumatic aortic (valve) stenosis: Secondary | ICD-10-CM | POA: Diagnosis present

## 2016-09-01 DIAGNOSIS — Z888 Allergy status to other drugs, medicaments and biological substances status: Secondary | ICD-10-CM

## 2016-09-01 DIAGNOSIS — I340 Nonrheumatic mitral (valve) insufficiency: Secondary | ICD-10-CM | POA: Diagnosis not present

## 2016-09-01 DIAGNOSIS — D631 Anemia in chronic kidney disease: Secondary | ICD-10-CM | POA: Diagnosis present

## 2016-09-01 DIAGNOSIS — M109 Gout, unspecified: Secondary | ICD-10-CM | POA: Diagnosis present

## 2016-09-01 DIAGNOSIS — I480 Paroxysmal atrial fibrillation: Secondary | ICD-10-CM | POA: Diagnosis present

## 2016-09-01 DIAGNOSIS — I255 Ischemic cardiomyopathy: Secondary | ICD-10-CM | POA: Diagnosis present

## 2016-09-01 DIAGNOSIS — K922 Gastrointestinal hemorrhage, unspecified: Secondary | ICD-10-CM | POA: Diagnosis not present

## 2016-09-01 DIAGNOSIS — E872 Acidosis: Secondary | ICD-10-CM | POA: Diagnosis present

## 2016-09-01 DIAGNOSIS — R05 Cough: Secondary | ICD-10-CM | POA: Diagnosis not present

## 2016-09-01 DIAGNOSIS — C3491 Malignant neoplasm of unspecified part of right bronchus or lung: Secondary | ICD-10-CM | POA: Diagnosis not present

## 2016-09-01 DIAGNOSIS — C3412 Malignant neoplasm of upper lobe, left bronchus or lung: Secondary | ICD-10-CM | POA: Diagnosis present

## 2016-09-01 DIAGNOSIS — M1612 Unilateral primary osteoarthritis, left hip: Secondary | ICD-10-CM | POA: Diagnosis not present

## 2016-09-01 DIAGNOSIS — K222 Esophageal obstruction: Secondary | ICD-10-CM | POA: Diagnosis present

## 2016-09-01 DIAGNOSIS — K264 Chronic or unspecified duodenal ulcer with hemorrhage: Secondary | ICD-10-CM | POA: Diagnosis not present

## 2016-09-01 DIAGNOSIS — F419 Anxiety disorder, unspecified: Secondary | ICD-10-CM | POA: Diagnosis present

## 2016-09-01 DIAGNOSIS — E1121 Type 2 diabetes mellitus with diabetic nephropathy: Secondary | ICD-10-CM | POA: Diagnosis present

## 2016-09-01 DIAGNOSIS — Z882 Allergy status to sulfonamides status: Secondary | ICD-10-CM

## 2016-09-01 DIAGNOSIS — I5022 Chronic systolic (congestive) heart failure: Secondary | ICD-10-CM | POA: Diagnosis present

## 2016-09-01 DIAGNOSIS — D649 Anemia, unspecified: Secondary | ICD-10-CM | POA: Diagnosis present

## 2016-09-01 DIAGNOSIS — E1169 Type 2 diabetes mellitus with other specified complication: Secondary | ICD-10-CM | POA: Diagnosis not present

## 2016-09-01 DIAGNOSIS — R0602 Shortness of breath: Secondary | ICD-10-CM | POA: Diagnosis not present

## 2016-09-01 DIAGNOSIS — I517 Cardiomegaly: Secondary | ICD-10-CM | POA: Diagnosis not present

## 2016-09-01 DIAGNOSIS — Z7982 Long term (current) use of aspirin: Secondary | ICD-10-CM

## 2016-09-01 DIAGNOSIS — Z79899 Other long term (current) drug therapy: Secondary | ICD-10-CM

## 2016-09-01 DIAGNOSIS — R531 Weakness: Secondary | ICD-10-CM | POA: Diagnosis not present

## 2016-09-01 DIAGNOSIS — Z87891 Personal history of nicotine dependence: Secondary | ICD-10-CM

## 2016-09-01 DIAGNOSIS — Z794 Long term (current) use of insulin: Secondary | ICD-10-CM

## 2016-09-01 DIAGNOSIS — Y95 Nosocomial condition: Secondary | ICD-10-CM | POA: Diagnosis present

## 2016-09-01 DIAGNOSIS — R404 Transient alteration of awareness: Secondary | ICD-10-CM | POA: Diagnosis not present

## 2016-09-01 DIAGNOSIS — Z8249 Family history of ischemic heart disease and other diseases of the circulatory system: Secondary | ICD-10-CM

## 2016-09-01 DIAGNOSIS — K274 Chronic or unspecified peptic ulcer, site unspecified, with hemorrhage: Secondary | ICD-10-CM | POA: Diagnosis not present

## 2016-09-01 DIAGNOSIS — Z803 Family history of malignant neoplasm of breast: Secondary | ICD-10-CM

## 2016-09-01 LAB — URINALYSIS, ROUTINE W REFLEX MICROSCOPIC
Bilirubin Urine: NEGATIVE
GLUCOSE, UA: 50 mg/dL — AB
Ketones, ur: NEGATIVE mg/dL
Nitrite: NEGATIVE
PROTEIN: NEGATIVE mg/dL
Specific Gravity, Urine: 1.015 (ref 1.005–1.030)
pH: 5 (ref 5.0–8.0)

## 2016-09-01 LAB — COMPREHENSIVE METABOLIC PANEL
ALT: 11 U/L — ABNORMAL LOW (ref 14–54)
ANION GAP: 10 (ref 5–15)
AST: 15 U/L (ref 15–41)
Albumin: 2.9 g/dL — ABNORMAL LOW (ref 3.5–5.0)
Alkaline Phosphatase: 105 U/L (ref 38–126)
BILIRUBIN TOTAL: 0.2 mg/dL — AB (ref 0.3–1.2)
BUN: 68 mg/dL — AB (ref 6–20)
CALCIUM: 8.3 mg/dL — AB (ref 8.9–10.3)
CO2: 17 mmol/L — ABNORMAL LOW (ref 22–32)
Chloride: 103 mmol/L (ref 101–111)
Creatinine, Ser: 2.3 mg/dL — ABNORMAL HIGH (ref 0.44–1.00)
GFR calc Af Amer: 21 mL/min — ABNORMAL LOW (ref >60)
GFR, EST NON AFRICAN AMERICAN: 18 mL/min — AB (ref >60)
Glucose, Bld: 326 mg/dL — ABNORMAL HIGH (ref 65–99)
POTASSIUM: 4.2 mmol/L (ref 3.5–5.1)
Sodium: 130 mmol/L — ABNORMAL LOW (ref 135–145)
TOTAL PROTEIN: 6 g/dL — AB (ref 6.5–8.1)

## 2016-09-01 LAB — I-STAT CG4 LACTIC ACID, ED
Lactic Acid, Venous: 2.24 mmol/L (ref 0.5–1.9)
Lactic Acid, Venous: 1.66 mmol/L (ref 0.5–1.9)

## 2016-09-01 LAB — CBC WITH DIFFERENTIAL/PLATELET
BASOS PCT: 0 %
Basophils Absolute: 0 10*3/uL (ref 0.0–0.1)
EOS PCT: 0 %
Eosinophils Absolute: 0 10*3/uL (ref 0.0–0.7)
HEMATOCRIT: 31.8 % — AB (ref 36.0–46.0)
Hemoglobin: 10.3 g/dL — ABNORMAL LOW (ref 12.0–15.0)
Lymphocytes Relative: 10 %
Lymphs Abs: 2.7 10*3/uL (ref 0.7–4.0)
MCH: 29.2 pg (ref 26.0–34.0)
MCHC: 32.4 g/dL (ref 30.0–36.0)
MCV: 90.1 fL (ref 78.0–100.0)
MONOS PCT: 11 %
Monocytes Absolute: 3 10*3/uL — ABNORMAL HIGH (ref 0.1–1.0)
NEUTROS PCT: 79 %
Neutro Abs: 21.3 10*3/uL — ABNORMAL HIGH (ref 1.7–7.7)
Platelets: 152 10*3/uL (ref 150–400)
RBC: 3.53 MIL/uL — ABNORMAL LOW (ref 3.87–5.11)
RDW: 16.8 % — AB (ref 11.5–15.5)
WBC: 27 10*3/uL — AB (ref 4.0–10.5)

## 2016-09-01 LAB — LACTIC ACID, PLASMA: Lactic Acid, Venous: 2.3 mmol/L (ref 0.5–1.9)

## 2016-09-01 LAB — GLUCOSE, CAPILLARY: Glucose-Capillary: 294 mg/dL — ABNORMAL HIGH (ref 65–99)

## 2016-09-01 LAB — INFLUENZA PANEL BY PCR (TYPE A & B)
Influenza A By PCR: NEGATIVE
Influenza B By PCR: NEGATIVE

## 2016-09-01 LAB — LIPASE, BLOOD: LIPASE: 22 U/L (ref 11–51)

## 2016-09-01 LAB — STREP PNEUMONIAE URINARY ANTIGEN: STREP PNEUMO URINARY ANTIGEN: NEGATIVE

## 2016-09-01 LAB — PROCALCITONIN: PROCALCITONIN: 1.08 ng/mL

## 2016-09-01 MED ORDER — CLOPIDOGREL BISULFATE 75 MG PO TABS
75.0000 mg | ORAL_TABLET | Freq: Every day | ORAL | Status: DC
  Administered 2016-09-01: 75 mg via ORAL
  Filled 2016-09-01: qty 1

## 2016-09-01 MED ORDER — HYDRALAZINE HCL 50 MG PO TABS
50.0000 mg | ORAL_TABLET | Freq: Two times a day (BID) | ORAL | Status: DC
  Administered 2016-09-02 – 2016-09-08 (×11): 50 mg via ORAL
  Filled 2016-09-01 (×13): qty 1

## 2016-09-01 MED ORDER — FERROUS GLUCONATE 324 (38 FE) MG PO TABS
324.0000 mg | ORAL_TABLET | Freq: Every day | ORAL | Status: DC
  Administered 2016-09-01 – 2016-09-07 (×6): 324 mg via ORAL
  Filled 2016-09-01 (×9): qty 1

## 2016-09-01 MED ORDER — PIPERACILLIN-TAZOBACTAM 3.375 G IVPB 30 MIN: 3.3750 g | Freq: Once | INTRAVENOUS | Status: DC

## 2016-09-01 MED ORDER — VANCOMYCIN HCL IN DEXTROSE 1-5 GM/200ML-% IV SOLN
1000.0000 mg | Freq: Once | INTRAVENOUS | Status: AC
  Administered 2016-09-01: 1000 mg via INTRAVENOUS
  Filled 2016-09-01: qty 200

## 2016-09-01 MED ORDER — FEBUXOSTAT 40 MG PO TABS
40.0000 mg | ORAL_TABLET | Freq: Every day | ORAL | Status: DC
  Administered 2016-09-01 – 2016-09-08 (×6): 40 mg via ORAL
  Filled 2016-09-01 (×8): qty 1

## 2016-09-01 MED ORDER — ISOSORBIDE MONONITRATE ER 60 MG PO TB24
60.0000 mg | ORAL_TABLET | Freq: Every day | ORAL | Status: DC
  Administered 2016-09-01 – 2016-09-07 (×6): 60 mg via ORAL
  Filled 2016-09-01 (×6): qty 1

## 2016-09-01 MED ORDER — BRINZOLAMIDE 1 % OP SUSP
1.0000 [drp] | Freq: Two times a day (BID) | OPHTHALMIC | Status: DC
  Administered 2016-09-01 – 2016-09-08 (×14): 1 [drp] via OPHTHALMIC
  Filled 2016-09-01 (×4): qty 10

## 2016-09-01 MED ORDER — SODIUM CHLORIDE 0.9 % IV BOLUS (SEPSIS)
500.0000 mL | Freq: Once | INTRAVENOUS | Status: AC
  Administered 2016-09-01: 500 mL via INTRAVENOUS

## 2016-09-01 MED ORDER — CALCITRIOL 0.25 MCG PO CAPS
0.2500 ug | ORAL_CAPSULE | ORAL | Status: DC
  Administered 2016-09-01 – 2016-09-08 (×4): 0.25 ug via ORAL
  Filled 2016-09-01 (×4): qty 1

## 2016-09-01 MED ORDER — VANCOMYCIN HCL IN DEXTROSE 1-5 GM/200ML-% IV SOLN: 1000.0000 mg | Freq: Once | INTRAVENOUS | Status: DC

## 2016-09-01 MED ORDER — INSULIN GLARGINE 100 UNIT/ML ~~LOC~~ SOLN
12.0000 [IU] | Freq: Every day | SUBCUTANEOUS | Status: DC
  Administered 2016-09-02 – 2016-09-08 (×6): 12 [IU] via SUBCUTANEOUS
  Filled 2016-09-01 (×9): qty 0.12

## 2016-09-01 MED ORDER — MIRTAZAPINE 15 MG PO TABS
15.0000 mg | ORAL_TABLET | Freq: Every day | ORAL | Status: DC
  Administered 2016-09-01 – 2016-09-04 (×4): 15 mg via ORAL
  Filled 2016-09-01 (×4): qty 1

## 2016-09-01 MED ORDER — ATORVASTATIN CALCIUM 40 MG PO TABS
80.0000 mg | ORAL_TABLET | Freq: Every day | ORAL | Status: DC
Start: 2016-09-01 — End: 2016-09-08
  Administered 2016-09-01 – 2016-09-07 (×6): 80 mg via ORAL
  Filled 2016-09-01 (×6): qty 2

## 2016-09-01 MED ORDER — LATANOPROST 0.005 % OP SOLN
1.0000 [drp] | Freq: Every day | OPHTHALMIC | Status: DC
  Administered 2016-09-01 – 2016-09-07 (×7): 1 [drp] via OPHTHALMIC
  Filled 2016-09-01 (×2): qty 2.5

## 2016-09-01 MED ORDER — LEVOTHYROXINE SODIUM 50 MCG PO TABS
50.0000 ug | ORAL_TABLET | Freq: Every day | ORAL | Status: DC
  Administered 2016-09-01 – 2016-09-07 (×6): 50 ug via ORAL
  Filled 2016-09-01 (×6): qty 1

## 2016-09-01 MED ORDER — CITALOPRAM HYDROBROMIDE 20 MG PO TABS
20.0000 mg | ORAL_TABLET | Freq: Every day | ORAL | Status: DC
  Administered 2016-09-01 – 2016-09-05 (×4): 20 mg via ORAL
  Filled 2016-09-01 (×4): qty 1

## 2016-09-01 MED ORDER — ASPIRIN 325 MG PO TABS
325.0000 mg | ORAL_TABLET | Freq: Every day | ORAL | Status: DC
  Administered 2016-09-01: 325 mg via ORAL
  Filled 2016-09-01: qty 1

## 2016-09-01 MED ORDER — SODIUM CHLORIDE 0.9 % IV SOLN
INTRAVENOUS | Status: DC
  Administered 2016-09-01: 14:00:00 via INTRAVENOUS

## 2016-09-01 MED ORDER — FOLIC ACID 1 MG PO TABS
1.0000 mg | ORAL_TABLET | Freq: Every day | ORAL | Status: DC
  Administered 2016-09-01 – 2016-09-08 (×7): 1 mg via ORAL
  Filled 2016-09-01 (×7): qty 1

## 2016-09-01 MED ORDER — PROCHLORPERAZINE MALEATE 10 MG PO TABS: 10.0000 mg | ORAL_TABLET | Freq: Four times a day (QID) | ORAL | Status: DC | PRN

## 2016-09-01 MED ORDER — SODIUM CHLORIDE 0.9 % IV BOLUS (SEPSIS)
250.0000 mL | Freq: Once | INTRAVENOUS | Status: AC
  Administered 2016-09-01: 250 mL via INTRAVENOUS

## 2016-09-01 MED ORDER — SODIUM CHLORIDE 0.9 % IV BOLUS (SEPSIS): 1000.0000 mL | Freq: Once | INTRAVENOUS | Status: DC

## 2016-09-01 MED ORDER — INSULIN ASPART 100 UNIT/ML ~~LOC~~ SOLN
0.0000 [IU] | Freq: Three times a day (TID) | SUBCUTANEOUS | Status: DC
  Administered 2016-09-01: 5 [IU] via SUBCUTANEOUS
  Administered 2016-09-02 (×2): 3 [IU] via SUBCUTANEOUS
  Administered 2016-09-03: 1 [IU] via SUBCUTANEOUS
  Administered 2016-09-03 – 2016-09-05 (×4): 2 [IU] via SUBCUTANEOUS
  Administered 2016-09-05: 5 [IU] via SUBCUTANEOUS
  Administered 2016-09-05: 2 [IU] via SUBCUTANEOUS
  Administered 2016-09-06: 1 [IU] via SUBCUTANEOUS
  Administered 2016-09-07: 3 [IU] via SUBCUTANEOUS
  Administered 2016-09-07 – 2016-09-08 (×2): 2 [IU] via SUBCUTANEOUS
  Administered 2016-09-08: 1 [IU] via SUBCUTANEOUS

## 2016-09-01 MED ORDER — BISOPROLOL FUMARATE 5 MG PO TABS
5.0000 mg | ORAL_TABLET | Freq: Every day | ORAL | Status: DC
  Administered 2016-09-01: 5 mg via ORAL
  Filled 2016-09-01: qty 1

## 2016-09-01 MED ORDER — PIPERACILLIN-TAZOBACTAM IN DEX 2-0.25 GM/50ML IV SOLN
2.2500 g | Freq: Four times a day (QID) | INTRAVENOUS | Status: DC
  Administered 2016-09-01 – 2016-09-06 (×18): 2.25 g via INTRAVENOUS
  Filled 2016-09-01 (×21): qty 50

## 2016-09-01 MED ORDER — SENNA 8.6 MG PO TABS: 1.0000 | ORAL_TABLET | Freq: Every day | ORAL | Status: DC | PRN

## 2016-09-01 NOTE — Progress Notes (Signed)
Pharmacy Antibiotic Note  Erica Jimenez is a 83 y.o. female admitted on 09/01/2016 with history of lung cancer presents with generalized weakness for about a week.  Last chemo was several weeks ago.  Pharmacy has been consulted for vancomycin and zosyn for sepsis.  Scr 2.3, CrCl ~ 22ms/min WBC 27  Plan: Vancomycin 1gm IV x 1 then check vanc level in 48 hours Zosyn 2.'25mg'$  IV q6h Daily Scr x 3 Follow renal function, cultures, and clinical course  Height: 5' 4.5" (163.8 cm) Weight: 140 lb (63.5 kg) IBW/kg (Calculated) : 55.85  Temp (24hrs), Avg:98.9 F (37.2 C), Min:98.9 F (37.2 C), Max:98.9 F (37.2 C)   Recent Labs Lab 08/26/16 1456 08/26/16 1456 09/01/16 1302 09/01/16 1308  WBC 3.3*  --   --  27.0*  CREATININE  --  2.3*  --  2.30*  LATICACIDVEN  --   --  2.24*  --     Estimated Creatinine Clearance: 16.1 mL/min (A) (by C-G formula based on SCr of 2.3 mg/dL (H)).    Allergies  Allergen Reactions  . Contrast Media [Iodinated Diagnostic Agents] Other (See Comments)    Renal insufficiency  . Fluoxetine Rash  . Sulfonamide Derivatives Nausea Only    Antimicrobials this admission: 4/30 vanc >> 4/30 zosyn >> Dose adjustments this admission:   Microbiology results: 4/30 BCx: sent  Thank you for allowing pharmacy to be a part of this patient's care.  EDolly RiasRPh 09/01/2016, 2:05 PM Pager [phone]

## 2016-09-01 NOTE — ED Notes (Signed)
Attempt x 1 on IV- NOT SUCCESSFUL

## 2016-09-01 NOTE — ED Notes (Signed)
BLOOD CULTURE 2ND 5CC [place] OBTAINED RT HAND

## 2016-09-01 NOTE — Progress Notes (Signed)
Patient with moderate Loose BM x3. Coffee ground in appearance and has the odor of GI bleed. Patient reports "several days" of loose bm but unable to describe appearance. Raliegh Ip Schorr paged and notified. New orders noted.

## 2016-09-01 NOTE — ED Notes (Signed)
Family at bedside. 

## 2016-09-01 NOTE — ED Notes (Signed)
AWARE OF NEED FOR URINE SAMPLE- UNABLE TO URINATE AT THIS TIME

## 2016-09-01 NOTE — ED Notes (Signed)
Bed: XI33 Expected date:  Expected time:  Means of arrival:  Comments: EMS weakness cancer

## 2016-09-01 NOTE — ED Notes (Signed)
BLOOD CULTURE 5CC OBTAINED LEFT WRIST BY EDP ALLEN. ALL BLOOD COLLECTED BY EDP.

## 2016-09-01 NOTE — ED Notes (Signed)
Patient currently on bedpan trying to give urine sample

## 2016-09-01 NOTE — ED Notes (Signed)
Patient transported to X-ray 

## 2016-09-01 NOTE — ED Notes (Signed)
Patient tried to give urine sample and was unsuccessful. Patient was able to have bowl movement.

## 2016-09-01 NOTE — ED Notes (Signed)
EDP ALLEN VERBALIZED "FINE WITH ONE BLOOD CULTURE AT THIS TIME"

## 2016-09-01 NOTE — Progress Notes (Signed)
CRITICAL VALUE STICKER  CRITICAL VALUE: Lactic Acid 2.3  RECEIVER (on-site recipient of call): Fausto Skillern RN  [place] NOTIFIED: 09/01/2016 2147  MESSENGER (representative from lab): Amy  MD NOTIFIED: Raliegh Ip Schorr  TIME OF NOTIFICATION: 2148  RESPONSE: new orders noted.

## 2016-09-01 NOTE — ED Provider Notes (Signed)
[facility] DEPT Provider Note   CSN: [id number] Arrival date & time: 09/01/16  1052     History   Chief Complaint Chief Complaint  Patient presents with  . CHEMO CARD  . Fatigue  . Diarrhea  . Emesis    HPI Erica Jimenez is a 83 y.o. female.  83 year old female with history of lung cancer presents with generalized weakness for about a week. Last chemotherapy was several weeks ago. Has had some loose stools as well as subjective fever. Has had emesis 2 or 3 but none current. Denies any dysuria or hematuria. Notes anorexia. Weakness is worse when she tries to ambulate. In short of breath at baseline but states this is been worse with some productive cough. Symptoms have been progressively worse and called EMS and was transported here.      Past Medical History:  Diagnosis Date  . A-V fistula ([place])    pt has 2  fistulas in rt arm  . Alopecia   . Anemia   . Anxiety   . Aortic stenosis   . Arthritis    gout  . Bladder cancer [facility])     s/p resection and BCG treatment.   Marland Kitchen CAD (coronary artery disease)     Pt is s/p anterior MI in 1996 with stent placed in LAD.  Last myoview in our office was in 7/05 and  showed apical anterior, apical, and apical inferior infarct.  Minimal peri-infarct ischemia.  EF 49%.   . Cardiomyopathy, ischemic   . CKD (chronic kidney disease)     Pt is stage III-IV.  She had a fistula placed but is not on dialysis.  She follows with Dr. Lorrene Reid for  nephrology.   Marland Kitchen COPD (chronic obstructive pulmonary disease) ([place])   . CVA (cerebral vascular accident) ([place])   . DM (diabetes mellitus) ([place])   . Full dentures   . Gout   . History of glaucoma   . HOH (hard of hearing)    slightly  . HTN (hypertension)   . Hyperlipidemia   . Hypothyroidism   . Myocardial infarction ([place])   . PAD (peripheral artery disease) ([facility])     arterial doppler study 12/04 suggestive of > 50% bilateral SFA stenosis.  Pt has mild claudication.  No invasive evaluation given  CKD and desire to avoid contrast use.   Marland Kitchen Shortness of breath dyspnea    on exertion  . Wears glasses     Patient Active Problem List   Diagnosis Date Noted  . Goals of care, counseling/discussion 08/26/2016  . Acute cerebrovascular accident (CVA) ([place]) 08/16/2016  . Type 2 diabetes mellitus with hyperlipidemia ([place]) 08/16/2016  . CKD (chronic kidney disease), stage III 08/16/2016  . Anemia 08/16/2016  . Stage IV squamous cell carcinoma of right lung ([place]) 08/14/2016  . Lung cancer ([place]) 08/12/2016  . Right lower lobe lung mass 07/21/2016  . Pulmonary infiltrates 05/20/2012  . Smoking 08/30/2010  . Aortic valve disorder 02/12/2009  . CARDIOMYOPATHY, ISCHEMIC 08/14/2008  . Hyperlipidemia 07/11/2008  . CAD, NATIVE VESSEL 07/11/2008  . BRADYCARDIA 07/11/2008  . Peripheral vascular disease ([place]) 07/11/2008  . CARDIAC MURMUR 07/11/2008  . CAROTID BRUIT 07/11/2008    Past Surgical History:  Procedure Laterality Date  . ABDOMINAL AORTAGRAM N/A 08/17/2013   Procedure: ABDOMINAL Maxcine Ham;  Surgeon: Wellington Hampshire, MD;  Location: [facility] CATH LAB;  Service: Cardiovascular;  Laterality: N/A;  . ANGIOPLASTY    . CYSTOSCOPY/RETROGRADE/URETEROSCOPY Bilateral 01/09/2015   Procedure: CYSTOSCOPY BILATERAL RETROGRADE  PYELOGRAM, WASHINGS RENAL PELVIS & BLADDER ;  Surgeon: Lowella Bandy, MD;  Location: [facility];  Service: Urology;  Laterality: Bilateral;  . CYSTOSTOMY W/ BLADDER BIOPSY    . EYE SURGERY Bilateral    cataract w IOL    OB History    No data available       Home Medications    Prior to Admission medications   Medication Sig Start Date End Date Taking? Authorizing Provider  ALPRAZolam (XANAX) 0.25 MG tablet Take 0.25 mg by mouth at bedtime.     Historical Provider, MD  aspirin 325 MG tablet Take 1 tablet (325 mg total) by mouth daily. 08/19/16   Dron Tanna Furry, MD  atorvastatin (LIPITOR) 80 MG tablet Take 80 mg by mouth daily after supper.     Historical  Provider, MD  bisoprolol (ZEBETA) 5 MG tablet take 1/2 tablet by mouth once daily **PLEASE KEEP UPCOMING APPOINTMENT FOR FURTHER REFILLS** 08/19/16   Larey Dresser, MD  brinzolamide (AZOPT) 1 % ophthalmic suspension Place 1 drop into both eyes 2 (two) times daily.     Historical Provider, MD  calcitRIOL (ROCALTROL) 0.25 MCG capsule Take 0.25 mcg by mouth every Monday, Wednesday, and Friday.     Historical Provider, MD  Carboxymethylcellulose Sodium (REFRESH TEARS OP) Place 1 drop into both eyes 3 (three) times daily as needed (dry eyes). Both eyes.    Historical Provider, MD  citalopram (CELEXA) 20 MG tablet Take 20 mg by mouth daily after supper.     Historical Provider, MD  clopidogrel (PLAVIX) 75 MG tablet Take 75 mg by mouth daily after supper.     Historical Provider, MD  febuxostat (ULORIC) 40 MG tablet Take 40 mg by mouth daily.    Historical Provider, MD  Ferrous Gluconate (FERGON PO) Take 1 tablet by mouth daily after supper.     Historical Provider, MD  folic acid (FOLVITE) 1 MG tablet Take 1 mg by mouth daily.    Historical Provider, MD  furosemide (LASIX) 40 MG tablet Take 40 mg by mouth daily.     Historical Provider, MD  hydrALAZINE (APRESOLINE) 50 MG tablet Take 1 tablet (50 mg total) by mouth 2 (two) times daily. 08/11/16   Larey Dresser, MD  insulin glargine (LANTUS) 100 unit/mL SOPN Inject 12 Units into the skin daily before breakfast.    Historical Provider, MD  insulin lispro (HUMALOG KWIKPEN) 100 UNIT/ML KiwkPen Inject 2-3 Units into the skin 3 (three) times daily before meals.    Historical Provider, MD  isosorbide mononitrate (IMDUR) 60 MG 24 hr tablet Take 60 mg by mouth daily after supper.     Historical Provider, MD  latanoprost (XALATAN) 0.005 % ophthalmic solution Place 1 drop into both eyes at bedtime.     Historical Provider, MD  levothyroxine (SYNTHROID, LEVOTHROID) 50 MCG tablet Take 50 mcg by mouth daily after supper.     Historical Provider, MD    lidocaine-prilocaine (EMLA) cream Apply 1 application topically as needed. Patient taking differently: Apply 1 application topically daily as needed (vaginal itching).  08/14/16   Curt Bears, MD  mirtazapine (REMERON) 30 MG tablet Take 15 mg by mouth at bedtime. 06/18/16   Historical Provider, MD  nitroGLYCERIN (NITROSTAT) 0.4 MG SL tablet Place 1 tablet (0.4 mg total) under the tongue every 5 (five) minutes as needed for chest pain. Patient not taking: Reported on 08/26/2016 10/26/14   Larey Dresser, MD  Phenylephrine-APAP-Guaifenesin ([place]) [phone] MG/20ML LIQD  Take 20 mLs by mouth 2 (two) times daily as needed (scratchy throat). Per bottle as needed     Historical Provider, MD  predniSONE (DELTASONE) 20 MG tablet 2 tabs po daily x 4 days 08/24/16   Deno Etienne, DO  prochlorperazine (COMPAZINE) 10 MG tablet Take 1 tablet (10 mg total) by mouth every 6 (six) hours as needed for nausea or vomiting. 08/14/16   Curt Bears, MD  senna (SENOKOT) 8.6 MG tablet Take 1 tablet by mouth daily as needed for constipation.     Historical Provider, MD    Family History Family History  Problem Relation Age of Onset  . Breast cancer Mother   . Tuberculosis Father   . Heart disease Maternal Aunt     Social History Social History  Substance Use Topics  . Smoking status: Former Smoker    Packs/day: 0.35    Years: 60.00    Types: Cigarettes    Quit date: 06/30/2016  . Smokeless tobacco: Never Used  . Alcohol use 0.6 oz/week    1 Shots of liquor per week     Comment: occasional     Allergies   Contrast media [iodinated diagnostic agents]; Fluoxetine; and Sulfonamide derivatives   Review of Systems Review of Systems  All other systems reviewed and are negative.    Physical Exam Updated Vital Signs BP (!) 125/95 (BP Location: Left Arm)   Pulse (!) 52   Temp 98.9 F (37.2 C) (Oral)   Resp 19   Ht 5' 4.5" (1.638 m)   Wt 63.5 kg   SpO2 99%   BMI 23.66 kg/m    Physical Exam  Constitutional: She is oriented to person, place, and time. She appears cachectic.  Non-toxic appearance. No distress.  HENT:  Head: Normocephalic and atraumatic.  Eyes: Conjunctivae, EOM and lids are normal. Pupils are equal, round, and reactive to light.  Neck: Normal range of motion. Neck supple. No tracheal deviation present. No thyroid mass present.  Cardiovascular: Regular rhythm and normal heart sounds.  Tachycardia present.  Exam reveals no gallop.   No murmur heard. Pulmonary/Chest: Effort normal. No stridor. No respiratory distress. She has decreased breath sounds in the right lower field and the left lower field. She has no wheezes. She has no rhonchi. She has no rales.  Abdominal: Soft. Normal appearance and bowel sounds are normal. She exhibits no distension. There is no tenderness. There is no rebound and no CVA tenderness.  Musculoskeletal: Normal range of motion. She exhibits no edema or tenderness.  Neurological: She is alert and oriented to person, place, and time. She has normal strength. No cranial nerve deficit or sensory deficit. GCS eye subscore is 4. GCS verbal subscore is 5. GCS motor subscore is 6.  Skin: Skin is warm and dry. No abrasion and no rash noted.  Psychiatric: She has a normal mood and affect. Her speech is normal and behavior is normal.  Nursing note and vitals reviewed.    ED Treatments / Results  Labs (all labs ordered are listed, but only abnormal results are displayed) Labs Reviewed  CULTURE, BLOOD (ROUTINE X 2)  CULTURE, BLOOD (ROUTINE X 2)  CBC WITH DIFFERENTIAL/PLATELET  COMPREHENSIVE METABOLIC PANEL  LIPASE, BLOOD  URINALYSIS, ROUTINE W REFLEX MICROSCOPIC  I-STAT CG4 LACTIC ACID, ED    EKG  EKG Interpretation  Date/Time:  Monday September 01 2016 11:03:32 EDT Ventricular Rate:  140 PR Interval:    QRS Duration: 139 QT Interval:  342 QTC Calculation: 522 R  Axis:   -74 Text Interpretation:  Sinus tachycardia  Ventricular premature complex Right bundle branch block Inferior infarct, old SINCE LAST TRACING HEART RATE HAS INCREASED Confirmed by Zenia Resides  MD, Janye Maynor ([id number]) on 09/01/2016 11:19:35 AM       Radiology No results found.  Procedures Procedures (including critical care time)  Medications Ordered in ED Medications  0.9 %  sodium chloride infusion (not administered)  sodium chloride 0.9 % bolus 250 mL (not administered)     Initial Impression / Assessment and Plan / ED Course  I have reviewed the triage vital signs and the nursing notes.  Pertinent labs & imaging results that were available during my care of the patient were reviewed by me and considered in my medical decision making (see chart for details).   patient leukocytosis on CBC. Patient's urinalysis is pending at this time however her chest x-ray does not show any acute infiltrate. Concern for possible bacteremia patient started on empiric antibiotics and will be admitted to the hospitalist service.  Final Clinical Impressions(s) / ED Diagnoses   Final diagnoses:  SOB (shortness of breath)    New Prescriptions New Prescriptions   No medications on file     Lacretia Leigh, MD 09/01/16 1417

## 2016-09-01 NOTE — ED Notes (Signed)
ED Provider at bedside. 

## 2016-09-01 NOTE — ED Notes (Signed)
2nd attempt- no successful. Charge RN Art gallery manager requested to assisted. IV consult ordered.

## 2016-09-01 NOTE — ED Notes (Signed)
NORMAL SALINE IVF AND VANCOCIN STOPPED AND IV D/C'D. PT STATED CAN USE RT HAND FOR IV AND LAB DRAWS. WITNESSED BY FAMILY AND EDP ALLEN. PT AGREED TO HAVE IV PLACED IN RT HAND.

## 2016-09-01 NOTE — ED Notes (Signed)
EKG PERFORMED AND GIVEN TO EDP ALLEN

## 2016-09-01 NOTE — H&P (Addendum)
History and Physical    Erica Jimenez ZOX:[id number] DOB: Sep 11, 1929 DOA: 09/01/2016  Referring MD/NP/PA: Dr. Zenia Resides  PCP: [name] (Inactive)   Outpatient Specialists: Oncology, Dr. [name]  Patient coming from: home  Chief Complaint: Cough, wheezing  HPI: Erica Jimenez is a 83 y.o. female with medical history significant for lung cancer, follows with Dr. Earlie Server, on palliative systemic chemotherapy with carboplatin and paclitaxel, last infusion 08/21/2016, further history of dyslipidemia, diabetes mellitus on insulin, hypertension, chronic systolic CHF with last echo earlier in April of this year with ejection fraction about 45%. She presents to ED with ongoing cough productive of white to yellow sputum. This has been going on for a month but then worsening over last couple of days. Patient reports no fevers but says her temperature has been running low. She also reports generalized weakness, fatigue, some shortness of breath with exertion. No reports of leg swelling. No chest pain or palpitations. No abdominal pain, nausea or vomiting.  ED Course: In ED, patient is afebrile, heart rate 50, respiratory rate 30, slight hypotension with blood pressure of 109/82, oxygen saturation 98% with nasal cannula oxygen support. Blood work was significant for white blood cell count of 27, hemoglobin 10.3, CO2 17 and creatinine 2.3, glucose 326. Lactic acid was 2.24. Urinalysis is pending. Chest x-ray shows multifocal masses which seem to have improved since a study a month ago. Because sepsis criteria were met on the admission, patient was started on empiric vancomycin and Zosyn while awaiting culture results.  Review of Systems:  Constitutional: Negative for fever, chills, diaphoresis, activity change, appetite change and fatigue.  HENT: Negative for ear pain, nosebleeds, congestion, facial swelling, rhinorrhea, neck pain, neck stiffness and ear discharge.   Eyes: Negative for pain, discharge,  redness, itching and visual disturbance.  Respiratory: Per history of present illness Cardiovascular: Negative for chest pain, palpitations and leg swelling.  Gastrointestinal: Negative for abdominal distention.  Genitourinary: Negative for dysuria, urgency, frequency, hematuria, flank pain, decreased urine volume, difficulty urinating and dyspareunia.  Musculoskeletal: Negative for back pain, joint swelling, arthralgias and gait problem.  Neurological: Negative for dizziness, tremors, seizures, syncope, facial asymmetry, speech difficulty, weakness, light-headedness, numbness and headaches.  Hematological: Negative for adenopathy. Does not bruise/bleed easily.  Psychiatric/Behavioral: Negative for hallucinations, behavioral problems, confusion, dysphoric mood, decreased concentration and agitation.   Past Medical History:  Diagnosis Date  . A-V fistula ([place])    pt has 2  fistulas in rt arm  . Alopecia   . Anemia   . Anxiety   . Aortic stenosis   . Arthritis    gout  . Bladder cancer [facility])     s/p resection and BCG treatment.   Marland Kitchen CAD (coronary artery disease)     Pt is s/p anterior MI in 1996 with stent placed in LAD.  Last myoview in our office was in 7/05 and  showed apical anterior, apical, and apical inferior infarct.  Minimal peri-infarct ischemia.  EF 49%.   . Cardiomyopathy, ischemic   . CKD (chronic kidney disease)     Pt is stage III-IV.  She had a fistula placed but is not on dialysis.  She follows with Dr. Lorrene Reid for  nephrology.   Marland Kitchen COPD (chronic obstructive pulmonary disease) ([place])   . CVA (cerebral vascular accident) ([place])   . DM (diabetes mellitus) ([place])   . Full dentures   . Gout   . History of glaucoma   . HOH (hard of hearing)    slightly  .  HTN (hypertension)   . Hyperlipidemia   . Hypothyroidism   . Myocardial infarction ([place])   . PAD (peripheral artery disease) ([facility])     arterial doppler study 12/04 suggestive of > 50% bilateral SFA stenosis.  Pt has mild  claudication.  No invasive evaluation given CKD and desire to avoid contrast use.   Marland Kitchen Shortness of breath dyspnea    on exertion  . Wears glasses     Past Surgical History:  Procedure Laterality Date  . ABDOMINAL AORTAGRAM N/A 08/17/2013   Procedure: ABDOMINAL Maxcine Ham;  Surgeon: Wellington Hampshire, MD;  Location: [facility];  Service: Cardiovascular;  Laterality: N/A;  . ANGIOPLASTY    . CYSTOSCOPY/RETROGRADE/URETEROSCOPY Bilateral 01/09/2015   Procedure: CYSTOSCOPY BILATERAL RETROGRADE PYELOGRAM, WASHINGS RENAL PELVIS & BLADDER ;  Surgeon: Lowella Bandy, MD;  Location: [facility];  Service: Urology;  Laterality: Bilateral;  . CYSTOSTOMY W/ BLADDER BIOPSY    . EYE SURGERY Bilateral    cataract w IOL    Social history:  reports that she quit smoking about 2 months ago. Her smoking use included Cigarettes. She has a 21.00 pack-year smoking history. She has never used smokeless tobacco. She reports that she drinks about 0.6 oz of alcohol per week . She reports that she does not use drugs.  Ambulation: Ambulates without assistance at baseline  Allergies  Allergen Reactions  . Contrast Media [Iodinated Diagnostic Agents] Other (See Comments)    Renal insufficiency  . Fluoxetine Rash  . Sulfonamide Derivatives Nausea Only    Family History  Problem Relation Age of Onset  . Breast cancer Mother   . Tuberculosis Father   . Heart disease Maternal Aunt     Prior to Admission medications   Medication Sig Start Date End Date Taking? Authorizing Provider  aspirin 325 MG tablet Take 1 tablet (325 mg total) by mouth daily. 08/19/16  Yes Dron Tanna Furry, MD  atorvastatin (LIPITOR) 80 MG tablet Take 80 mg by mouth daily after supper.    Yes Historical Provider, MD  bisoprolol (ZEBETA) 5 MG tablet take 1/2 tablet by mouth once daily **PLEASE KEEP UPCOMING APPOINTMENT FOR FURTHER REFILLS** 08/19/16  Yes Larey Dresser, MD  brinzolamide (AZOPT) 1 % ophthalmic suspension Place 1  drop into both eyes 2 (two) times daily.    Yes Historical Provider, MD  calcitRIOL (ROCALTROL) 0.25 MCG capsule Take 0.25 mcg by mouth every Monday, Wednesday, and Friday.    Yes Historical Provider, MD  Carboxymethylcellulose Sodium (REFRESH TEARS OP) Place 1 drop into both eyes 3 (three) times daily as needed (dry eyes). Both eyes.   Yes Historical Provider, MD  citalopram (CELEXA) 20 MG tablet Take 20 mg by mouth daily after supper.    Yes Historical Provider, MD  clopidogrel (PLAVIX) 75 MG tablet Take 75 mg by mouth daily after supper.    Yes Historical Provider, MD  febuxostat (ULORIC) 40 MG tablet Take 40 mg by mouth daily.   Yes Historical Provider, MD  Ferrous Gluconate (FERGON PO) Take 1 tablet by mouth daily after supper.    Yes Historical Provider, MD  folic acid (FOLVITE) 1 MG tablet Take 1 mg by mouth daily.   Yes Historical Provider, MD  furosemide (LASIX) 40 MG tablet Take 40 mg by mouth daily.    Yes Historical Provider, MD  hydrALAZINE (APRESOLINE) 50 MG tablet Take 1 tablet (50 mg total) by mouth 2 (two) times daily. 08/11/16  Yes Larey Dresser, MD  insulin glargine (  LANTUS) 100 unit/mL SOPN Inject 12 Units into the skin daily before breakfast.   Yes Historical Provider, MD  insulin lispro (HUMALOG KWIKPEN) 100 UNIT/ML KiwkPen Inject 2-3 Units into the skin 3 (three) times daily before meals.   Yes Historical Provider, MD  isosorbide mononitrate (IMDUR) 60 MG 24 hr tablet Take 60 mg by mouth daily after supper.    Yes Historical Provider, MD  latanoprost (XALATAN) 0.005 % ophthalmic solution Place 1 drop into both eyes at bedtime.    Yes Historical Provider, MD  levothyroxine (SYNTHROID, LEVOTHROID) 50 MCG tablet Take 50 mcg by mouth daily after supper.    Yes Historical Provider, MD  lidocaine-prilocaine (EMLA) cream Apply 1 application topically as needed. Patient taking differently: Apply 1 application topically daily as needed (vaginal itching).  08/14/16  Yes Curt Bears,  MD  mirtazapine (REMERON) 30 MG tablet Take 15 mg by mouth at bedtime. 06/18/16  Yes Historical Provider, MD  nitroGLYCERIN (NITROSTAT) 0.4 MG SL tablet Place 1 tablet (0.4 mg total) under the tongue every 5 (five) minutes as needed for chest pain. 10/26/14  Yes Larey Dresser, MD  prochlorperazine (COMPAZINE) 10 MG tablet Take 1 tablet (10 mg total) by mouth every 6 (six) hours as needed for nausea or vomiting. 08/14/16  Yes Curt Bears, MD  senna (SENOKOT) 8.6 MG tablet Take 1 tablet by mouth daily as needed for constipation.    Yes Historical Provider, MD  predniSONE (DELTASONE) 20 MG tablet 2 tabs po daily x 4 days Patient not taking: Reported on 09/01/2016 08/24/16   Deno Etienne, DO    Physical Exam: Vitals:   09/01/16 1106 09/01/16 1145 09/01/16 1245 09/01/16 1315  BP:  109/82 (!) 135/96 135/77  Pulse:   (!) 50   Resp: 19 19 (!) 21 20  Temp:      TempSrc:      SpO2:   98%   Weight:      Height:        Constitutional: NAD, calm, comfortable Vitals:   09/01/16 1106 09/01/16 1145 09/01/16 1245 09/01/16 1315  BP:  109/82 (!) 135/96 135/77  Pulse:   (!) 50   Resp: 19 19 (!) 21 20  Temp:      TempSrc:      SpO2:   98%   Weight:      Height:       Eyes: PERRL, lids and conjunctivae normal ENMT: Mucous membranes are moist. Posterior pharynx clear of any exudate or lesions.Normal dentition.  Neck: normal, supple, no masses, no thyromegaly Respiratory: Wheezing in upper and mid lung lobes, coarse breath sounds Cardiovascular: Rate controlled, appreciate S1-S2 Abdomen: no tenderness, no masses palpated. No hepatosplenomegaly. Bowel sounds positive.  Musculoskeletal: no clubbing / cyanosis. No joint deformity upper and lower extremities. Good ROM, no contractures. Normal muscle tone.  Skin: no rashes, lesions, ulcers. No induration Neurologic: CN 2-12 grossly intact. Sensation intact, DTR normal. Strength 5/5 in all 4.  Psychiatric: Normal judgment and insight. Alert and oriented x  3. Normal mood.    Labs on Admission: I have personally reviewed following labs and imaging studies  CBC:  Recent Labs Lab 08/26/16 1456 09/01/16 1308  WBC 3.3* 27.0*  NEUTROABS 2.7 21.3*  HGB 12.1 10.3*  HCT 40.1 31.8*  MCV 90.7 90.1  PLT 137* 157   Basic Metabolic Panel:  Recent Labs Lab 08/26/16 1456 09/01/16 1308  NA 136 130*  K 5.4* 4.2  CL  --  103  CO2 19*  17*  GLUCOSE 356* 326*  BUN 69.2* 68*  CREATININE 2.3* 2.30*  CALCIUM 9.2 8.3*   GFR: Estimated Creatinine Clearance: 16.1 mL/min (A) (by C-G formula based on SCr of 2.3 mg/dL (H)). Liver Function Tests:  Recent Labs Lab 08/26/16 1456 09/01/16 1308  AST 58* 15  ALT 32 11*  ALKPHOS 79 105  BILITOT 0.37 0.2*  PROT 7.1 6.0*  ALBUMIN 3.1* 2.9*    Recent Labs Lab 09/01/16 1308  LIPASE 22   No results for input(s): AMMONIA in the last 168 hours. Coagulation Profile: No results for input(s): INR, PROTIME in the last 168 hours. Cardiac Enzymes: No results for input(s): CKTOTAL, CKMB, CKMBINDEX, TROPONINI in the last 168 hours. BNP (last 3 results) No results for input(s): PROBNP in the last 8760 hours. HbA1C: No results for input(s): HGBA1C in the last 72 hours. CBG: No results for input(s): GLUCAP in the last 168 hours. Lipid Profile: No results for input(s): CHOL, HDL, LDLCALC, TRIG, CHOLHDL, LDLDIRECT in the last 72 hours. Thyroid Function Tests: No results for input(s): TSH, T4TOTAL, FREET4, T3FREE, THYROIDAB in the last 72 hours. Anemia Panel: No results for input(s): VITAMINB12, FOLATE, FERRITIN, TIBC, IRON, RETICCTPCT in the last 72 hours. Urine analysis:  Sepsis Labs: _0 (procalcitonin:4,lacticidven:4) )No results found for this or any previous visit (from the past 240 hour(s)).   Radiological Exams on Admission: Dg Chest 2 View Result Date: 09/01/2016 Improving right hilar lymphadenopathy and the multifocal masses throughout the right lung. No new/acute cardiopulmonary  disease. Stable mild cardiomegaly without pulmonary edema.   EKG: Independently reviewed. Sinus tachycardia  Assessment/Plan  Active Problems: Sepsis secondary to lobar pneumonia, unspecified organism / HCAP / leukocytosis / lactic acidosis - Sepsis criteria met on the admission with bradycardia, tachypnea, slight hypotension on the admission, leukocytosis and lactic acidosis - Source of infection likely pneumonia. She did have recent infusion, carboplatin and paclitaxel on 08/21/2016 so started abx tx for suspected HCAP - Chest x-ray on the admission shows right hilar lymphadenopathy and multifocal masses throughout the right lung which have been present one month ago and there is actually some improvement since that study - Started vancomycin and Zosyn - Follow-up blood cultures, respiratory culture, influenza, Legionella and strep pneumonia results - Continue oxygen support via nasal cannula to keep oxygen saturation above 90%  Stage III non-small cell lung cancer - Diagnosed in March 2018, presented with multiple masses in the right upper and lower lobes in addition to mediastinal lymphadenopathy and questionable nodule in left upper lobe - Follows with Dr. [name] of oncology  Chronic kidney disease stage IV - Recent creatinine was 2.3 and creatinine on this admission is also 2.3, within baseline range - Lasix placed on hold until we can make sure that his renal function remains stable  Anemia of chronic kidney disease - Hemoglobin stable  Chronic systolic CHF - Ejection fraction on recent echo 08/17/2016 was 45-50% - Lasix temporarily on hold until we can make sure that renal function remains stable  Essential hypertension - Continue bisoprolol, hydralazine, isosorbide  Coronary artery disease, native artery - Continue aspirin and Plavix  Diabetes mellitus with diabetic nephropathy with long-term insulin use - Continue Lantus 12 units a day - Added sliding scale  insulin - Check A1c   Dyslipidemia associated with type 2 diabetes mellitus - Continue Lipitor 80 mg at bedtime   Hypothyroidism - Continue synthroid   Depression - Continue Celexa     DVT prophylaxis: SCDs bilaterally Code Status: Full code Family Communication:  No family at the bedside Disposition Plan: Admission to telemetry Consults called: None Admission status: Inpatient, patient admitted with sepsis and pneumonia, requires vancomycin and Zosyn while awaiting culture results.   Leisa Lenz MD [facility] Pager [phone]  If 7PM-7AM, please contact night-coverage [email or web address] Password [email or web address]  09/01/2016, 3:02 PM

## 2016-09-01 NOTE — ED Triage Notes (Signed)
Per [facility]- Pt resides at home. CHEMO last in 2 weeks. Generalized weakness over a week. Vomiting and diarrhea over a week however not constant. Family does come and check on pt. Neighbor was called this AM by pt. NEG FOR STROKE. Pt with chronic a-fib. HR 70-130. Per EMS- pt baseline shortness of breath with exertion and denies chest pain and fever.

## 2016-09-02 ENCOUNTER — Encounter (HOSPITAL_COMMUNITY): Payer: Self-pay | Admitting: Anesthesiology

## 2016-09-02 ENCOUNTER — Inpatient Hospital Stay (HOSPITAL_COMMUNITY): Payer: Medicare Other | Admitting: Anesthesiology

## 2016-09-02 ENCOUNTER — Encounter (HOSPITAL_COMMUNITY)
Admission: EM | Disposition: A | Discharge status: Hospice | Payer: Self-pay | Source: Home / Self Care | Attending: Family Medicine

## 2016-09-02 DIAGNOSIS — E440 Moderate protein-calorie malnutrition: Secondary | ICD-10-CM | POA: Insufficient documentation

## 2016-09-02 DIAGNOSIS — C3491 Malignant neoplasm of unspecified part of right bronchus or lung: Secondary | ICD-10-CM

## 2016-09-02 DIAGNOSIS — K922 Gastrointestinal hemorrhage, unspecified: Secondary | ICD-10-CM

## 2016-09-02 DIAGNOSIS — K264 Chronic or unspecified duodenal ulcer with hemorrhage: Secondary | ICD-10-CM

## 2016-09-02 HISTORY — PX: ESOPHAGOGASTRODUODENOSCOPY: SHX5428

## 2016-09-02 LAB — BLOOD CULTURE ID PANEL (REFLEXED)
Acinetobacter baumannii: NOT DETECTED
CANDIDA PARAPSILOSIS: NOT DETECTED
Candida albicans: NOT DETECTED
Candida glabrata: NOT DETECTED
Candida krusei: NOT DETECTED
Candida tropicalis: NOT DETECTED
ENTEROCOCCUS SPECIES: NOT DETECTED
Enterobacter cloacae complex: NOT DETECTED
Enterobacteriaceae species: NOT DETECTED
Escherichia coli: NOT DETECTED
HAEMOPHILUS INFLUENZAE: NOT DETECTED
KLEBSIELLA OXYTOCA: NOT DETECTED
Klebsiella pneumoniae: NOT DETECTED
LISTERIA MONOCYTOGENES: NOT DETECTED
Neisseria meningitidis: NOT DETECTED
PROTEUS SPECIES: NOT DETECTED
PSEUDOMONAS AERUGINOSA: NOT DETECTED
SERRATIA MARCESCENS: NOT DETECTED
STAPHYLOCOCCUS AUREUS BCID: NOT DETECTED
STAPHYLOCOCCUS SPECIES: NOT DETECTED
STREPTOCOCCUS PYOGENES: NOT DETECTED
STREPTOCOCCUS SPECIES: NOT DETECTED
Streptococcus agalactiae: NOT DETECTED
Streptococcus pneumoniae: NOT DETECTED

## 2016-09-02 LAB — CBC
HCT: 23.4 % — ABNORMAL LOW (ref 36.0–46.0)
HCT: 27.1 % — ABNORMAL LOW (ref 36.0–46.0)
HEMATOCRIT: 27.9 % — AB (ref 36.0–46.0)
Hemoglobin: 7.5 g/dL — ABNORMAL LOW (ref 12.0–15.0)
Hemoglobin: 9.4 g/dL — ABNORMAL LOW (ref 12.0–15.0)
Hemoglobin: 9.4 g/dL — ABNORMAL LOW (ref 12.0–15.0)
MCH: 29 pg (ref 26.0–34.0)
MCH: 30.4 pg (ref 26.0–34.0)
MCH: 30.6 pg (ref 26.0–34.0)
MCHC: 32.1 g/dL (ref 30.0–36.0)
MCHC: 33.7 g/dL (ref 30.0–36.0)
MCHC: 34.7 g/dL (ref 30.0–36.0)
MCV: 90.3 fL (ref 78.0–100.0)
MCV: 90.3 fL (ref 78.0–100.0)
MCV: 88.3 fL (ref 78.0–100.0)
PLATELETS: 80 10*3/uL — AB (ref 150–400)
Platelets: 124 10*3/uL — ABNORMAL LOW (ref 150–400)
Platelets: 82 10*3/uL — ABNORMAL LOW (ref 150–400)
RBC: 2.59 MIL/uL — ABNORMAL LOW (ref 3.87–5.11)
RBC: 3.09 MIL/uL — ABNORMAL LOW (ref 3.87–5.11)
RBC: 3.07 MIL/uL — ABNORMAL LOW (ref 3.87–5.11)
RDW: 17 % — ABNORMAL HIGH (ref 11.5–15.5)
RDW: 15.6 % — AB (ref 11.5–15.5)
RDW: 16.1 % — ABNORMAL HIGH (ref 11.5–15.5)
WBC: 19.7 10*3/uL — ABNORMAL HIGH (ref 4.0–10.5)
WBC: 29.8 10*3/uL — ABNORMAL HIGH (ref 4.0–10.5)
WBC: 32.7 10*3/uL — ABNORMAL HIGH (ref 4.0–10.5)

## 2016-09-02 LAB — GLUCOSE, CAPILLARY
GLUCOSE-CAPILLARY: 185 mg/dL — AB (ref 65–99)
GLUCOSE-CAPILLARY: 208 mg/dL — AB (ref 65–99)
GLUCOSE-CAPILLARY: 236 mg/dL — AB (ref 65–99)
GLUCOSE-CAPILLARY: 231 mg/dL — AB (ref 65–99)
Glucose-Capillary: 223 mg/dL — ABNORMAL HIGH (ref 65–99)

## 2016-09-02 LAB — LACTIC ACID, PLASMA
Lactic Acid, Venous: 3.5 mmol/L (ref 0.5–1.9)
Lactic Acid, Venous: 2.8 mmol/L (ref 0.5–1.9)

## 2016-09-02 LAB — MRSA PCR SCREENING: MRSA BY PCR: NEGATIVE

## 2016-09-02 LAB — ABO/RH: ABO/RH(D): A POS

## 2016-09-02 LAB — OCCULT BLOOD X 1 CARD TO LAB, STOOL: Fecal Occult Bld: POSITIVE — AB

## 2016-09-02 LAB — PROTIME-INR
INR: 1.38
Prothrombin Time: 17.1 seconds — ABNORMAL HIGH (ref 11.4–15.2)

## 2016-09-02 LAB — PREPARE RBC (CROSSMATCH)

## 2016-09-02 LAB — HIV ANTIBODY (ROUTINE TESTING W REFLEX): HIV Screen 4th Generation wRfx: NONREACTIVE

## 2016-09-02 SURGERY — EGD (ESOPHAGOGASTRODUODENOSCOPY)
Anesthesia: Monitor Anesthesia Care

## 2016-09-02 MED ORDER — ALBUMIN HUMAN 5 % IV SOLN
INTRAVENOUS | Status: AC
  Filled 2016-09-02: qty 250

## 2016-09-02 MED ORDER — SIMETHICONE 80 MG PO CHEW
160.0000 mg | CHEWABLE_TABLET | Freq: Four times a day (QID) | ORAL | Status: DC | PRN
  Administered 2016-09-02: 160 mg via ORAL
  Filled 2016-09-02: qty 2

## 2016-09-02 MED ORDER — SUCCINYLCHOLINE CHLORIDE 200 MG/10ML IV SOSY
PREFILLED_SYRINGE | INTRAVENOUS | Status: AC
Start: 2016-09-02 — End: 2016-09-02
  Filled 2016-09-02: qty 10

## 2016-09-02 MED ORDER — SODIUM CHLORIDE 0.9 % IJ SOLN
PREFILLED_SYRINGE | INTRAMUSCULAR | Status: DC | PRN
  Administered 2016-09-02: 1 mL
  Administered 2016-09-02: 3 mL

## 2016-09-02 MED ORDER — ONDANSETRON HCL 4 MG/2ML IJ SOLN
INTRAMUSCULAR | Status: AC
  Filled 2016-09-02: qty 2

## 2016-09-02 MED ORDER — SODIUM CHLORIDE 0.9 % IV BOLUS (SEPSIS)
500.0000 mL | Freq: Once | INTRAVENOUS | Status: AC
  Administered 2016-09-03: 500 mL via INTRAVENOUS

## 2016-09-02 MED ORDER — SUCCINYLCHOLINE CHLORIDE 200 MG/10ML IV SOSY
PREFILLED_SYRINGE | INTRAVENOUS | Status: DC | PRN
  Administered 2016-09-02: 100 mg via INTRAVENOUS

## 2016-09-02 MED ORDER — SODIUM CHLORIDE 0.9 % IV SOLN
INTRAVENOUS | Status: DC
Start: 2016-09-02 — End: 2016-09-04
  Administered 2016-09-02 – 2016-09-03 (×2): via INTRAVENOUS

## 2016-09-02 MED ORDER — SODIUM CHLORIDE 0.9 % IV SOLN: Freq: Once | INTRAVENOUS | Status: AC

## 2016-09-02 MED ORDER — EPHEDRINE SULFATE 50 MG/ML IJ SOLN
INTRAMUSCULAR | Status: DC | PRN
  Administered 2016-09-02: 10 mg via INTRAVENOUS

## 2016-09-02 MED ORDER — PANTOPRAZOLE SODIUM 40 MG IV SOLR
8.0000 mg/h | INTRAVENOUS | Status: AC
  Administered 2016-09-02 – 2016-09-05 (×6): 8 mg/h via INTRAVENOUS
  Filled 2016-09-02 (×11): qty 80

## 2016-09-02 MED ORDER — PHENYLEPHRINE HCL 10 MG/ML IJ SOLN
INTRAMUSCULAR | Status: AC
  Filled 2016-09-02: qty 1

## 2016-09-02 MED ORDER — ENSURE ENLIVE PO LIQD
237.0000 mL | Freq: Two times a day (BID) | ORAL | Status: DC
  Administered 2016-09-06 – 2016-09-07 (×2): 237 mL via ORAL

## 2016-09-02 MED ORDER — MORPHINE SULFATE (PF) 4 MG/ML IV SOLN
1.0000 mg | INTRAVENOUS | Status: DC | PRN
  Administered 2016-09-02: 1 mg via INTRAVENOUS
  Filled 2016-09-02: qty 1

## 2016-09-02 MED ORDER — PANTOPRAZOLE SODIUM 40 MG IV SOLR
40.0000 mg | Freq: Two times a day (BID) | INTRAVENOUS | Status: DC
  Administered 2016-09-02: 40 mg via INTRAVENOUS
  Filled 2016-09-02: qty 40

## 2016-09-02 MED ORDER — BOOST / RESOURCE BREEZE PO LIQD
1.0000 | Freq: Three times a day (TID) | ORAL | Status: DC
  Administered 2016-09-04 – 2016-09-07 (×4): 1 via ORAL

## 2016-09-02 MED ORDER — SODIUM CHLORIDE 0.9 % IV SOLN
INTRAVENOUS | Status: DC
  Administered 2016-09-02: 15:00:00 via INTRAVENOUS

## 2016-09-02 MED ORDER — BISOPROLOL FUMARATE 5 MG PO TABS
2.5000 mg | ORAL_TABLET | Freq: Every day | ORAL | Status: DC
  Administered 2016-09-02 – 2016-09-07 (×6): 2.5 mg via ORAL
  Filled 2016-09-02 (×6): qty 1

## 2016-09-02 MED ORDER — PROPOFOL 10 MG/ML IV BOLUS
INTRAVENOUS | Status: DC | PRN
  Administered 2016-09-02: 50 mg via INTRAVENOUS

## 2016-09-02 MED ORDER — DEXMEDETOMIDINE HCL IN NACL 200 MCG/50ML IV SOLN
INTRAVENOUS | Status: AC
  Filled 2016-09-02: qty 50

## 2016-09-02 MED ORDER — SODIUM CHLORIDE 0.9 % IV SOLN
INTRAVENOUS | Status: DC | PRN
  Administered 2016-09-02: 15:00:00 via INTRAVENOUS

## 2016-09-02 NOTE — Progress Notes (Addendum)
PROGRESS NOTE    Erica Jimenez  HEN:[id number] DOB: January 05, 1930 DOA: 09/01/2016 PCP: [name] (Inactive)     Brief Narrative:  Erica Jimenez is a 83 y.o. female with medical history significant for lung cancer, follows with Dr. Earlie Server, on palliative systemic chemotherapy with carboplatin and paclitaxel, last infusion 08/21/2016, further history of dyslipidemia, diabetes mellitus on insulin, hypertension, chronic systolic CHF with last echo earlier in April of this year with ejection fraction about 45%. She presents to ED with ongoing cough productive of white to yellow sputum. This has been going on for a month but then worsening over last couple of days. Patient reports no fevers but says her temperature has been running low. She also reports generalized weakness, fatigue, some shortness of breath with exertion.   She was admitted for suspected sepsis secondary to HCAP, started on broad-spectrum antibiotics. Overnight on 4/30-5/1, she had reports of 2 bloody bowel movements as well as coffee-ground emesis. GI was consulted. Patient was given 2u pRBC due to acute bleeding and transferred to step down unit.   Assessment & Plan:   Principal Problem:   Sepsis ([place]) Active Problems:   Right lower lobe lung mass   Stage IV squamous cell carcinoma of right lung (HCC)   Type 2 diabetes mellitus with hyperlipidemia (HCC)   CKD (chronic kidney disease), stage IV (HCC)   Anemia   Goals of care, counseling/discussion   Sepsis secondary to HCAP / lobar pneumonia, unspecified organism  - Chest x-ray on the admission shows right hilar lymphadenopathy and multifocal masses throughout the right lung which have been present one month ago and there is actually some improvement since that study. Patient complaining of cough and general malaise, influenza and strep pneumo Ag negative  - Empiric vancomycin and Zosyn. Check MRSA screen, if this is negative, could deescalate to zosyn only - Blood  cultures pending   Acute blood loss anemia on chronic normocytic anemia - Reports of 2 bloody bowel movements, coffee-ground emesis overnight  - Hemoglobin dropped from 10.3 to 7.5 overnight - Fecal occult positive - Hold aspirin and Plavix, start Protonix IV - GI consulted - 2u pRBC today, transfer to stepdown unit   Stage III non-small cell lung cancer - Diagnosed in March 2018, presented with multiple masses in the right upper and lower lobes in addition to mediastinal lymphadenopathy and questionable nodule in left upper lobe - Follows with Dr. [name] of oncology  Chronic kidney disease stage IV - Baseline Cr 2.3 - Has a right upper arm fistula, follows with [facility], has not started HD  - Lasix temporarily on hold until we can make sure that renal function remains stable - Stable   Chronic systolic CHF - Ejection fraction on recent echo 08/17/2016 was 45-50% - Lasix temporarily on hold until we can make sure that renal function remains stable  Essential hypertension - Continue bisoprolol, hydralazine, isosorbide  Coronary artery disease, hx CVA  - Hold aspirin and Plavix in light of GI bleed   Diabetes mellitus with diabetic nephropathy with long-term insulin use - Continue Lantus, SSI Novolog   - HA1c pending    Dyslipidemia  - Continue Lipitor   Hypothyroidism - Continue synthroid   Depression - Continue Celexa, remeron     DVT prophylaxis: SCDs Code Status: Full Family Communication: No family at bedside Disposition Plan: Pending improvement   Consultants:   GI  Procedures:   None  Antimicrobials:   Vanco 4/30 >>   Zosyn  4/30 >>    Subjective: Patient reports feeling ill over the past month, worsening in the past week. She admits to generalized weakness, low-grade fever, nonproductive cough.  Objective: Vitals:   09/01/16 1608 09/01/16 1630 09/01/16 2042 09/02/16 0508  BP:  135/66 95/67 (!) 117/40  Pulse: 63  (!) 58 (!) 51 (!) 56  Resp: (!) '21 20 20 20  '$ Temp: 98.8 F (37.1 C) 97.6 F (36.4 C) 97.8 F (36.6 C) 99.3 F (37.4 C)  TempSrc: Oral Oral Oral Oral  SpO2: 95% 100% 99%   Weight:  63.2 kg (139 lb 5.3 oz)    Height:  5' 4.5" (1.638 m)      Intake/Output Summary (Last 24 hours) at 09/02/16 0951 Last data filed at 09/02/16 0439  Gross per 24 hour  Intake              420 ml  Output              300 ml  Net              120 ml   Filed Weights   09/01/16 1102 09/01/16 1630  Weight: 63.5 kg (140 lb) 63.2 kg (139 lb 5.3 oz)    Examination:  General exam: Appears calm and comfortable  Respiratory system: Coarse bilaterally, worse on right, Respiratory effort normal. Cardiovascular system: S1 & S2 heard, RRR. No JVD, murmurs, rubs, gallops or clicks. No pedal edema. Gastrointestinal system: Abdomen is nondistended, soft and nontender. No organomegaly or masses felt. Normal bowel sounds heard. Central nervous system: Alert and oriented. No focal neurological deficits. Extremities: Symmetric 5 x 5 power. Fistula on right upper extremity  Skin: No rashes, lesions or ulcers Psychiatry: Judgement and insight appear normal. Mood & affect appropriate.   Data Reviewed: I have personally reviewed following labs and imaging studies  CBC:  Recent Labs Lab 08/26/16 1456 09/01/16 1308 09/02/16 0747  WBC 3.3* 27.0* 19.7*  NEUTROABS 2.7 21.3*  --   HGB 12.1 10.3* 7.5*  HCT 40.1 31.8* 23.4*  MCV 90.7 90.1 90.3  PLT 137* 152 195*   Basic Metabolic Panel:  Recent Labs Lab 08/26/16 1456 09/01/16 1308  NA 136 130*  K 5.4* 4.2  CL  --  103  CO2 19* 17*  GLUCOSE 356* 326*  BUN 69.2* 68*  CREATININE 2.3* 2.30*  CALCIUM 9.2 8.3*   GFR: Estimated Creatinine Clearance: 16.1 mL/min (A) (by C-G formula based on SCr of 2.3 mg/dL (H)). Liver Function Tests:  Recent Labs Lab 08/26/16 1456 09/01/16 1308  AST 58* 15  ALT 32 11*  ALKPHOS 79 105  BILITOT 0.37 0.2*  PROT 7.1 6.0*    ALBUMIN 3.1* 2.9*    Recent Labs Lab 09/01/16 1308  LIPASE 22   No results for input(s): AMMONIA in the last 168 hours. Coagulation Profile: No results for input(s): INR, PROTIME in the last 168 hours. Cardiac Enzymes: No results for input(s): CKTOTAL, CKMB, CKMBINDEX, TROPONINI in the last 168 hours. BNP (last 3 results) No results for input(s): PROBNP in the last 8760 hours. HbA1C: No results for input(s): HGBA1C in the last 72 hours. CBG:  Recent Labs Lab 09/01/16 1722 09/01/16 2104 09/02/16 0722  GLUCAP 294* 185* 208*   Lipid Profile: No results for input(s): CHOL, HDL, LDLCALC, TRIG, CHOLHDL, LDLDIRECT in the last 72 hours. Thyroid Function Tests: No results for input(s): TSH, T4TOTAL, FREET4, T3FREE, THYROIDAB in the last 72 hours. Anemia Panel: No results for input(s): VITAMINB12, FOLATE,  FERRITIN, TIBC, IRON, RETICCTPCT in the last 72 hours. Sepsis Labs:  Recent Labs Lab 09/01/16 1302 09/01/16 1554 09/01/16 1825  PROCALCITON  --   --  1.08  LATICACIDVEN 2.24* 1.66 2.3*    Recent Results (from the past 240 hour(s))  Blood Culture (routine x 2)     Status: None (Preliminary result)   Collection Time: 09/01/16  1:08 PM  Result Value Ref Range Status   Specimen Description BLOOD LEFT WRIST  Final   Special Requests   Final    BOTTLES DRAWN AEROBIC ONLY Blood Culture adequate volume   Culture   Final    NO GROWTH < 24 HOURS Performed at [facility], [place] [address] [place], [place] [address]    Report Status PENDING  Incomplete  Blood Culture (routine x 2)     Status: None (Preliminary result)   Collection Time: 09/01/16  2:28 PM  Result Value Ref Range Status   Specimen Description BLOOD RIGHT HAND  Final   Special Requests   Final    BOTTLES DRAWN AEROBIC AND ANAEROBIC Blood Culture adequate volume   Culture   Final    NO GROWTH < 24 HOURS Performed at [facility], [place] [address], [place], [place] [address]    Report Status PENDING   Incomplete       Radiology Studies: Dg Chest 2 View  Result Date: 09/01/2016 CLINICAL DATA:  83 year old with current history of right upper and lower lobe lung cancer for which she is undergoing treatment, presenting with progressively worsening fatigue, cough, chest congestion and shortness of breath over the past few days. EXAM: CHEST  2 VIEW COMPARISON:  08/24/2016, 08/17/2016 and earlier, including PET-CT 07/30/2016. FINDINGS: AP semi-erect and lateral images were obtained. Cardiac CT mildly enlarged for AP technique, unchanged. Thoracic aorta atherosclerotic, unchanged. Right hilar lymphadenopathy and the multifocal masses throughout the right lung identified on the examinations 1 month ago have shown significant interval improvement. No new pulmonary parenchymal abnormalities in either lung. No pleural effusions. Visualized bony thorax intact. IMPRESSION: Improving right hilar lymphadenopathy and the multifocal masses throughout the right lung. No new/acute cardiopulmonary disease. Stable mild cardiomegaly without pulmonary edema. Electronically Signed   By: Evangeline Dakin M.D.   On: 09/01/2016 11:55      Scheduled Meds: . atorvastatin  80 mg Oral QPC supper  . bisoprolol  2.5 mg Oral QHS  . brinzolamide  1 drop Both Eyes BID  . calcitRIOL  0.25 mcg Oral Q M,W,F  . citalopram  20 mg Oral QPC supper  . febuxostat  40 mg Oral Daily  . ferrous gluconate  324 mg Oral QPC supper  . folic acid  1 mg Oral Daily  . hydrALAZINE  50 mg Oral BID  . insulin aspart  0-9 Units Subcutaneous TID WC  . insulin glargine  12 Units Subcutaneous QAC breakfast  . isosorbide mononitrate  60 mg Oral QPC supper  . latanoprost  1 drop Both Eyes QHS  . levothyroxine  50 mcg Oral QPC supper  . mirtazapine  15 mg Oral QHS  . pantoprazole (PROTONIX) IV  40 mg Intravenous Q12H   Continuous Infusions: . piperacillin-tazobactam (ZOSYN)  IV 2.25 g (09/02/16 0943)     LOS: 1 day    Time spent: 40  minutes   Dessa Phi, DO [facility] [email or web address] Password [email or web address] 09/02/2016, 9:51 AM

## 2016-09-02 NOTE — Significant Event (Signed)
Rapid Response Event Note  Overview: Time Called: 1215 Arrival Time: 1220 Event Type: Other (Comment) (GI Bleed)  Initial Focused Assessment: Went to patient room, patient had just had 3 large melanous stools. Patient complaints of dizziness and weakness. Patient was hypotensive on arrival to her room.   See vital signs starting [phone]  Interventions: brought to ICU and taken to Endo at 1300  Plan of Care (if not transferred):  Event Summary: Name of Physician Notified: Dr. Dessa Phi at  (already at bedside)    at    Outcome: Transferred (Comment) (transfered ICU/SD)  Event End Time: [place]  Erica Jimenez B

## 2016-09-02 NOTE — Progress Notes (Addendum)
Initial Nutrition Assessment  DOCUMENTATION CODES:   Non-severe (moderate) malnutrition in context of chronic illness  INTERVENTION:   Ensure Enlive po BID, each supplement provides 350 kcal and 20 grams of protein  Boost Breeze po TID, each supplement provides 250 kcal and 9 grams of protein  NUTRITION DIAGNOSIS:   Malnutrition (moderate) related to cancer and cancer related treatments as evidenced by moderate depletions of muscle mass, mild depletion of body fat.  GOAL:   Patient will meet greater than or equal to 90% of their needs  MONITOR:   PO intake, Supplement acceptance, Labs, Weight trends  REASON FOR ASSESSMENT:   Malnutrition Screening Tool    ASSESSMENT:   83 y.o. female with a past medical history significant for lung cancer on palliative systemic chemotherapy with carboplatin and paclitaxel, last infusion 08/21/16, as well as dyslipidemia, diabetes on insulin, hypertension, chronic systolic CHF last echo in April with an ejection fraction of 45%, who presented to the ED with ongoing cough productive of white to yellow sputum, worsening over the past couple of days. She was diagnosed with sepsis secondary to lobar pneumonia. Pt now with coffee ground emesis    Met with pt in room today. Pt reports a steady decrease in her appetite since Feb 2017. Pt reports poor oral intake for several months pta. Pt currently NPO for EGD today. Per MD note, patient with upper GI bleed scheduled to get prbcs today as pt with melena and coffee ground emesis. Per chart, pt is weight stable. RD will go ahead and order supplements for pt that she can have once diet advanced.    Medications reviewed and include: calcitriol, folic acid, insulin, synthroid, remeron  Labs reviewed: Na 130(L), BUN 68(H), creat 2.30(H), Ca 8.3(L) adj. 9.18 wnl, alb 2.9(L) Wbc- 19.7(H), Hgb 7.5(L), Hct 23.4(L) cbgs- 326 AIC 7.6(H)  Nutrition-Focused physical exam completed. Findings are mild fat depletion  in upper arms and chest, moderate muscle depletion in clavicles, shoulders, and hands, and no edema.   Diet Order:  Diet NPO time specified  Skin:  Reviewed, no issues  Last BM:  4/30- diarrhea   Height:   Ht Readings from Last 1 Encounters:  09/01/16 5' 4.5" (1.638 m)    Weight:   Wt Readings from Last 1 Encounters:  09/01/16 139 lb 5.3 oz (63.2 kg)    Ideal Body Weight:  55.6 kg  BMI:  Body mass index is 23.55 kg/m.  Estimated Nutritional Needs:   Kcal:  1600-1800kcal/day   Protein:  82-95g/day   Fluid:  >1.6L/day   EDUCATION NEEDS:   No education needs identified at this time  Koleen Distance, RD, LDN Pager #[phone]

## 2016-09-02 NOTE — Progress Notes (Signed)
PT Cancellation Note  Patient Details Name: Erica Jimenez MRN: [id number] DOB: [birth year]   Cancelled Treatment:    Reason Eval/Treat Not Completed: Medical issues which prohibited therapy Per MD note from today "2u pRBC today, transfer to stepdown unit"  Will hold therapy today and check back as schedule permits.   Tramain Gershman,KATHrine E 09/02/2016, 12:01 PM Carmelia Bake, PT, DPT 09/02/2016 Pager: [phone]

## 2016-09-02 NOTE — Progress Notes (Signed)
Pt c/o bloating and cramping. Raliegh Ip Schorr paged and notified. Patient medicated per order and MAR. Immediately following med admin, pt with 300 ml's of coffee ground emesis with strong odor of iron. Raliegh Ip Schorr paged again and notified.

## 2016-09-02 NOTE — Progress Notes (Signed)
Pt c/o 8/10 pain to rt calf.  Calf tender to palpation, no redness/warmth noted. Dr Maylene Roes aware.  New orders noted for morphine and duplex.  Will continue to monitor pt.

## 2016-09-02 NOTE — Consult Note (Signed)
Consultation  Referring Provider: Dr. Maylene Roes     Primary Care Physician:  [name] (Inactive) Primary Gastroenterologist: Althia Forts        Reason for Consultation:  Hematochezia, Coffee-ground emesis, ABLA            HPI:   Erica Jimenez is a 83 y.o. female with a past medical history significant for lung cancer on palliative systemic chemotherapy with carboplatin and paclitaxel, last infusion 08/21/16, as well as dyslipidemia, diabetes on insulin, hypertension, chronic systolic CHF last echo in April with an ejection fraction of 45%, who presented to the ED with ongoing cough productive of white to yellow sputum, worsening over the past couple of days. She was diagnosed with sepsis secondary to lobar pneumonia. We will consult did today in regards to the fact that the patient started with coffee-ground emesis and hematochezia overnight with a 3 g drop in hemoglobin.     Today, the patient is found laying in bed. She tells me that she "doesn't feel good". She tells me she started passing a blackish "coffee-ground-appearing" stool last night around 11:30. Per nursing she had about 3-4 episodes of this and currently the patient tells me that she coughed and passed another stool, when visualized this is melena. Patient also describes that she had an episode of vomiting, per nursing around 300 mL, early this morning around 4:30 of coffee-ground emesis. She has not had another episode of vomiting but is quite nauseous. Accompanying symptoms include epigastric pain/burning. Patient denies previous history of this. She has not eaten since midnight. Her Plavix was held today.   Patient denies fever, chills, previous episodes of the same home, prior abdominal pain or syncope.    ED Course: In ED, patient was afebrile, heart rate 50, respiratory rate 30, slight hypotension with blood pressure of 109/82, oxygen saturation 98% with nasal cannula oxygen support. Blood work was significant for white blood  cell count of 27, hemoglobin 10.3, CO2 17 and creatinine 2.3, glucose 326. Lactic acid was 2.24. Chest x-ray shows multifocal masses which seem to have improved since a study a month ago. Because sepsis criteria were met on the admission, patient was started on empiric vancomycin and Zosyn while awaiting culture results.  Past Gi History: unavailable  Past Medical History:  Diagnosis Date  . A-V fistula ([place])    pt has 2  fistulas in rt arm  . Alopecia   . Anemia   . Anxiety   . Aortic stenosis   . Arthritis    gout  . Bladder cancer [facility])     s/p resection and BCG treatment.   Marland Kitchen CAD (coronary artery disease)     Pt is s/p anterior MI in 1996 with stent placed in LAD.  Last myoview in our office was in 7/05 and  showed apical anterior, apical, and apical inferior infarct.  Minimal peri-infarct ischemia.  EF 49%.   . Cardiomyopathy, ischemic   . CKD (chronic kidney disease)     Pt is stage III-IV.  She had a fistula placed but is not on dialysis.  She follows with Dr. Lorrene Reid for  nephrology.   Marland Kitchen COPD (chronic obstructive pulmonary disease) ([place])   . CVA (cerebral vascular accident) ([place])   . DM (diabetes mellitus) ([place])   . Full dentures   . Gout   . History of glaucoma   . HOH (hard of hearing)    slightly  . HTN (hypertension)   . Hyperlipidemia   .  Hypothyroidism   . Myocardial infarction ([place])   . PAD (peripheral artery disease) ([facility])     arterial doppler study 12/04 suggestive of > 50% bilateral SFA stenosis.  Pt has mild claudication.  No invasive evaluation given CKD and desire to avoid contrast use.   Marland Kitchen Shortness of breath dyspnea    on exertion  . Wears glasses     Past Surgical History:  Procedure Laterality Date  . ABDOMINAL AORTAGRAM N/A 08/17/2013   Procedure: ABDOMINAL Maxcine Ham;  Surgeon: Wellington Hampshire, MD;  Location: [facility] CATH LAB;  Service: Cardiovascular;  Laterality: N/A;  . ANGIOPLASTY    . CYSTOSCOPY/RETROGRADE/URETEROSCOPY Bilateral 01/09/2015    Procedure: CYSTOSCOPY BILATERAL RETROGRADE PYELOGRAM, WASHINGS RENAL PELVIS & BLADDER ;  Surgeon: Lowella Bandy, MD;  [facility]  Service: Urology;  Laterality: Bilateral;  . CYSTOSTOMY W/ BLADDER BIOPSY    . EYE SURGERY Bilateral    cataract w IOL    Family History  Problem Relation Age of Onset  . Breast cancer Mother   . Tuberculosis Father   . Heart disease Maternal Aunt     Social History  Substance Use Topics  . Smoking status: Former Smoker    Packs/day: 0.35    Years: 60.00    Types: Cigarettes    Quit date: 06/30/2016  . Smokeless tobacco: Never Used  . Alcohol use 0.6 oz/week    1 Shots of liquor per week     Comment: occasional    Prior to Admission medications   Medication Sig Start Date End Date Taking? Authorizing Provider  aspirin 325 MG tablet Take 1 tablet (325 mg total) by mouth daily. 08/19/16  Yes Dron Tanna Furry, MD  atorvastatin (LIPITOR) 80 MG tablet Take 80 mg by mouth daily after supper.    Yes Historical Provider, MD  bisoprolol (ZEBETA) 5 MG tablet take 1/2 tablet by mouth once daily **PLEASE KEEP UPCOMING APPOINTMENT FOR FURTHER REFILLS** 08/19/16  Yes Larey Dresser, MD  brinzolamide (AZOPT) 1 % ophthalmic suspension Place 1 drop into both eyes 2 (two) times daily.    Yes Historical Provider, MD  calcitRIOL (ROCALTROL) 0.25 MCG capsule Take 0.25 mcg by mouth every Monday, Wednesday, and Friday.    Yes Historical Provider, MD  Carboxymethylcellulose Sodium (REFRESH TEARS OP) Place 1 drop into both eyes 3 (three) times daily as needed (dry eyes). Both eyes.   Yes Historical Provider, MD  citalopram (CELEXA) 20 MG tablet Take 20 mg by mouth daily after supper.    Yes Historical Provider, MD  clopidogrel (PLAVIX) 75 MG tablet Take 75 mg by mouth daily after supper.    Yes Historical Provider, MD  febuxostat (ULORIC) 40 MG tablet Take 40 mg by mouth daily.   Yes Historical Provider, MD  Ferrous Gluconate (FERGON PO) Take 1 tablet  by mouth daily after supper.    Yes Historical Provider, MD  folic acid (FOLVITE) 1 MG tablet Take 1 mg by mouth daily.   Yes Historical Provider, MD  furosemide (LASIX) 40 MG tablet Take 40 mg by mouth daily.    Yes Historical Provider, MD  hydrALAZINE (APRESOLINE) 50 MG tablet Take 1 tablet (50 mg total) by mouth 2 (two) times daily. 08/11/16  Yes Larey Dresser, MD  insulin glargine (LANTUS) 100 unit/mL SOPN Inject 12 Units into the skin daily before breakfast.   Yes Historical Provider, MD  insulin lispro (HUMALOG KWIKPEN) 100 UNIT/ML KiwkPen Inject 2-3 Units into the skin 3 (three) times daily  before meals.   Yes Historical Provider, MD  isosorbide mononitrate (IMDUR) 60 MG 24 hr tablet Take 60 mg by mouth daily after supper.    Yes Historical Provider, MD  latanoprost (XALATAN) 0.005 % ophthalmic solution Place 1 drop into both eyes at bedtime.    Yes Historical Provider, MD  levothyroxine (SYNTHROID, LEVOTHROID) 50 MCG tablet Take 50 mcg by mouth daily after supper.    Yes Historical Provider, MD  lidocaine-prilocaine (EMLA) cream Apply 1 application topically as needed. Patient taking differently: Apply 1 application topically daily as needed (vaginal itching).  08/14/16  Yes Curt Bears, MD  mirtazapine (REMERON) 30 MG tablet Take 15 mg by mouth at bedtime. 06/18/16  Yes Historical Provider, MD  nitroGLYCERIN (NITROSTAT) 0.4 MG SL tablet Place 1 tablet (0.4 mg total) under the tongue every 5 (five) minutes as needed for chest pain. 10/26/14  Yes Larey Dresser, MD  prochlorperazine (COMPAZINE) 10 MG tablet Take 1 tablet (10 mg total) by mouth every 6 (six) hours as needed for nausea or vomiting. 08/14/16  Yes Curt Bears, MD  senna (SENOKOT) 8.6 MG tablet Take 1 tablet by mouth daily as needed for constipation.    Yes Historical Provider, MD  predniSONE (DELTASONE) 20 MG tablet 2 tabs po daily x 4 days Patient not taking: Reported on 09/01/2016 08/24/16   Deno Etienne, DO    Current  Facility-Administered Medications  Medication Dose Route Frequency Provider Last Rate Last Dose  . atorvastatin (LIPITOR) tablet 80 mg  80 mg Oral QPC supper Robbie Lis, MD   80 mg at 09/01/16 1807  . bisoprolol (ZEBETA) tablet 2.5 mg  2.5 mg Oral QHS Bruce Mayers Chahn-Yang Choi, DO      . brinzolamide (AZOPT) 1 % ophthalmic suspension 1 drop  1 drop Both Eyes BID Robbie Lis, MD   1 drop at 09/02/16 [phone]  . calcitRIOL (ROCALTROL) capsule 0.25 mcg  0.25 mcg Oral Q M,W,F Robbie Lis, MD   0.25 mcg at 09/01/16 1808  . citalopram (CELEXA) tablet 20 mg  20 mg Oral QPC supper Robbie Lis, MD   20 mg at 09/01/16 1809  . febuxostat (ULORIC) tablet 40 mg  40 mg Oral Daily Robbie Lis, MD   Stopped at 09/02/16 [phone]  . ferrous gluconate (FERGON) tablet 324 mg  324 mg Oral QPC supper Robbie Lis, MD   324 mg at 09/01/16 1808  . folic acid (FOLVITE) tablet 1 mg  1 mg Oral Daily Robbie Lis, MD   Stopped at 09/02/16 [phone]  . hydrALAZINE (APRESOLINE) tablet 50 mg  50 mg Oral BID Robbie Lis, MD   Stopped at 09/02/16 [phone]  . insulin aspart (novoLOG) injection 0-9 Units  0-9 Units Subcutaneous TID WC Robbie Lis, MD   3 Units at 09/02/16 [phone]  . insulin glargine (LANTUS) injection 12 Units  12 Units Subcutaneous QAC breakfast Robbie Lis, MD      . isosorbide mononitrate (IMDUR) 24 hr tablet 60 mg  60 mg Oral QPC supper Robbie Lis, MD   60 mg at 09/01/16 1809  . latanoprost (XALATAN) 0.005 % ophthalmic solution 1 drop  1 drop Both Eyes QHS Robbie Lis, MD   1 drop at 09/01/16 2110  . levothyroxine (SYNTHROID, LEVOTHROID) tablet 50 mcg  50 mcg Oral QPC supper Robbie Lis, MD   50 mcg at 09/01/16 1809  . mirtazapine (REMERON) tablet 15 mg  15 mg Oral QHS  Robbie Lis, MD   15 mg at 09/01/16 2107  . pantoprazole (PROTONIX) injection 40 mg  40 mg Intravenous Q12H Shon Millet, DO   40 mg at 09/02/16 0943  . piperacillin-tazobactam (ZOSYN) IVPB 2.25 g  2.25 g Intravenous Q6H Lacretia Leigh, MD 100 mL/hr at 09/02/16 0943 2.25 g at 09/02/16 0943  . prochlorperazine (COMPAZINE) tablet 10 mg  10 mg Oral Q6H PRN Robbie Lis, MD      . senna [facility]) tablet 8.6 mg  1 tablet Oral Daily PRN Robbie Lis, MD      . simethicone [facility]) chewable tablet 160 mg  160 mg Oral QID PRN Jeryl Columbia, NP   160 mg at 09/02/16 0438    Allergies as of 09/01/2016 - Review Complete 09/01/2016  Allergen Reaction Noted  . Contrast media [iodinated diagnostic agents] Other (See Comments) 01/02/2015  . Fluoxetine Rash 05/22/2015  . Sulfonamide derivatives Nausea Only 07/11/2008     Review of Systems:    Constitutional: Positive for weakness Skin: No rash Cardiovascular: No chest pain Respiratory:Positive for SOB Gastrointestinal: See HPI and otherwise negative Genitourinary: No dysuria  Neurological: Positive for dizziness Musculoskeletal: No new muscle or joint pain Hematologic: No bruising Psychiatric: No history of depression or anxiety   Physical Exam:  Vital signs in last 24 hours: Temp:  [97.6 F (36.4 C)-99.3 F (37.4 C)] 99.3 F (37.4 C) (05/01 0508) Pulse Rate:  [50-133] 56 (05/01 0508) Resp:  [19-30] 20 (05/01 0508) BP: (95-135)/(40-96) 117/40 (05/01 0508) SpO2:  [74 %-100 %] 99 % (04/30 2042) Weight:  [139 lb 5.3 oz (63.2 kg)-140 lb (63.5 kg)] 139 lb 5.3 oz (63.2 kg) (04/30 1630) Last BM Date: 09/01/16 General:   Pleasant African American female appears to be in mild distress, Well developed, Well nourished, alert and cooperative Head:  Normocephalic and atraumatic. Eyes:   PEERL, EOMI. No icterus. Conjunctiva pink. Ears:  Normal auditory acuity. Neck:  Supple Throat: Oral cavity and pharynx without inflammation, swelling or lesion. Lungs: Respirations even and unlabored. Lungs clear to auscultation bilaterally.   No wheezes, crackles, or rhonchi.  Heart: Normal S1, S2. No MRG. Regular rate and rhythm. No peripheral edema, cyanosis or pallor.  Abdomen:   Soft, nondistended,moderate ttp epigastrum No rebound or guarding. Normal bowel sounds. No appreciable masses or hepatomegaly. Rectal:  External: melenic stool observed from rectum Msk:  Symmetrical without gross deformities.  Extremities:  Without edema, no deformity or joint abnormality.  Neurologic:  Alert and  oriented x4;  grossly normal neurologically.  Skin:   Dry and intact without significant lesions or rashes. Psychiatric:  Demonstrates good judgement and reason without abnormal affect or behaviors.  LAB RESULTS:  Recent Labs  09/01/16 1308 09/02/16 0747  WBC 27.0* 19.7*  HGB 10.3* 7.5*  HCT 31.8* 23.4*  PLT 152 124*   BMET  Recent Labs  09/01/16 1308  NA 130*  K 4.2  CL 103  CO2 17*  GLUCOSE 326*  BUN 68*  CREATININE 2.30*  CALCIUM 8.3*   LFT  Recent Labs  09/01/16 1308  PROT 6.0*  ALBUMIN 2.9*  AST 15  ALT 11*  ALKPHOS 105  BILITOT 0.2*    STUDIES: Dg Chest 2 View  Result Date: 09/01/2016 CLINICAL DATA:  83 year old with current history of right upper and lower lobe lung cancer for which she is undergoing treatment, presenting with progressively worsening fatigue, cough, chest congestion and shortness of breath over the past few days. EXAM:  CHEST  2 VIEW COMPARISON:  08/24/2016, 08/17/2016 and earlier, including PET-CT 07/30/2016. FINDINGS: AP semi-erect and lateral images were obtained. Cardiac CT mildly enlarged for AP technique, unchanged. Thoracic aorta atherosclerotic, unchanged. Right hilar lymphadenopathy and the multifocal masses throughout the right lung identified on the examinations 1 month ago have shown significant interval improvement. No new pulmonary parenchymal abnormalities in either lung. No pleural effusions. Visualized bony thorax intact. IMPRESSION: Improving right hilar lymphadenopathy and the multifocal masses throughout the right lung. No new/acute cardiopulmonary disease. Stable mild cardiomegaly without pulmonary edema.  Electronically Signed   By: Evangeline Dakin M.D.   On: 09/01/2016 11:55   PREVIOUS ENDOSCOPIES:            None available   Impression / Plan:   Impression: 1. Upper GI bleed: Patient started with coffee-ground emesis last night and multiple melenic stools, corresponding 3 g drop in hemoglobin overnight; Concern for PUD vs other causing acute GI bleed 2. Sepsis secondary lobar pneumonia: Patient being treated with vancomycin and Zosyn 3. Stage III non-small cell lung cancer: Diagnosed March 2018, follows with Dr. Earlie Server of oncology, currently on palliative chemotherapy 4. Chronic kidney disease stage IV: Recent creatinine was 2.3, this admission also 2.3 5. Acute blood loss anemia on top of anemia of chronic kidney disease: Hemoglobin currently 7.5 from 10.3 yesterday  Plan: 1. Discussed with nursing that patient needs blood running now due to acute bleeding, this has already been ordered by hospitalist 2u prbcs' stat, she also needs to be cleaned up 2. Patient will need EGD.  Discussed risks, benefits, limitation and alternatives and the patient agrees to proceed. On schedule for 1:30 in endo 3. Continue to monitor hgb with transfusion as needed <7 4. Patient will need assistance in and out of bed to comode-close observance by nursing 5. Started Pantoprazole drip 6. Patient needs 2 large bore IV's 20 g if possible-discussed with nursing 7. Hold Plavix and ASA 8. Per discussion with hospitalist pt being transferred to step down 9. Please await any further recs from Dr. Hilarie Fredrickson  Thank you for your kind consultation, we will continue to follow.  Lavone Nian Alieyah Spader  09/02/2016, 10:25 AM Pager #: [phone]

## 2016-09-02 NOTE — Transfer of Care (Signed)
Immediate Anesthesia Transfer of Care Note  Patient: [name]  Procedure(s) Performed: Procedure(s): ESOPHAGOGASTRODUODENOSCOPY (EGD) (N/A)  Patient Location: PACU and Endoscopy Unit  Anesthesia Type:General  Level of Consciousness: awake, alert  and patient cooperative  Airway & Oxygen Therapy: Patient Spontanous Breathing and Patient connected to face mask oxygen  Post-op Assessment: Report given to RN and Post -op Vital signs reviewed and stable  Post vital signs: Reviewed and stable  Last Vitals:  Vitals:   09/02/16 1510 09/02/16 1520  BP: (!) 145/57 (!) 154/56  Pulse: 64 71  Resp: (!) 36 (!) 38  Temp:      Last Pain:  Vitals:   09/02/16 1430  TempSrc: Oral  PainSc:          Complications: No apparent anesthesia complications

## 2016-09-02 NOTE — Progress Notes (Signed)
CRITICAL VALUE ALERT  Critical value received:  Lactic acid 3.5  Date of notification:  09/02/16  Time of notification:  8315  Critical value read back: yes  Nurse who received alert:  Floyce Stakes, RN  MD notified (1st page):  Dr Maylene Roes  Time of first page: 1745  MD notified (2nd page): Dr Maylene Roes  Time of second page: 1810  Responding MD: Dr Maylene Roes placed orders for fluids and repeat lactic. But no verbal response.   Time MD responded:  1815

## 2016-09-02 NOTE — Anesthesia Preprocedure Evaluation (Addendum)
Anesthesia Evaluation  Patient identified by MRN, date of birth, ID band Patient awake    Reviewed: Allergy & Precautions, NPO status , Patient's Chart, lab work & pertinent test results  Airway Mallampati: II  TM Distance: >3 FB Neck ROM: Full    Dental no notable dental hx.    Pulmonary COPD, former smoker,  Erica Jimenez is a 83 y.o. female with medical history significant for lung cancer, follows with Dr. Earlie Server, on palliative systemic chemotherapy with carboplatin and paclitaxel, last infusion 08/21/2016, further history of dyslipidemia, diabetes mellitus on insulin, hypertension, chronic systolic CHF with last echo earlier in April of this year with ejection fraction about 45%   Pulmonary exam normal breath sounds clear to auscultation       Cardiovascular hypertension, + CAD, + Past MI, + Cardiac Stents and +CHF  Normal cardiovascular exam Rhythm:Regular Rate:Normal     Neuro/Psych CVA negative psych ROS   GI/Hepatic negative GI ROS, Neg liver ROS,   Endo/Other  negative endocrine ROSdiabetes  Renal/GU Renal InsufficiencyRenal disease  negative genitourinary   Musculoskeletal negative musculoskeletal ROS (+)   Abdominal   Peds negative pediatric ROS (+)  Hematology negative hematology ROS (+)   Anesthesia Other Findings   Reproductive/Obstetrics negative OB ROS                             Anesthesia Physical Anesthesia Plan  ASA: IV  Anesthesia Plan: MAC   Post-op Pain Management:    Induction: Intravenous  Airway Management Planned: Nasal Cannula  Additional Equipment:   Intra-op Plan:   Post-operative Plan:   Informed Consent: I have reviewed the patients History and Physical, chart, labs and discussed the procedure including the risks, benefits and alternatives for the proposed anesthesia with the patient or authorized representative who has indicated his/her  understanding and acceptance.   Dental advisory given  Plan Discussed with: CRNA  Anesthesia Plan Comments:        Anesthesia Quick Evaluation

## 2016-09-02 NOTE — Op Note (Signed)
[facility] Patient Name: Erica Jimenez Procedure Date: 09/02/2016 MRN: [id number] Attending MD: Jerene Bears , MD Date of Birth: June 09, 1929 CSN: [id number] Age: 83 Admit Type: Inpatient Procedure:                Upper GI endoscopy Indications:              Acute post hemorrhagic anemia, Hematochezia,                            Melena, Active gastrointestinal bleeding Providers:                Lajuan Lines. Hilarie Fredrickson, MD, Laverta Baltimore RN, RN, Cherylynn Ridges, Technician, [employer], CRNA Referring MD:             [facility] Medicines:                General Anesthesia Complications:            No immediate complications Estimated Blood Loss:     Estimated blood loss: Greater than 100 mL (active                            bleeding before and during the case treated as                            above). Procedure:                Pre-Anesthesia Assessment:                           - Prior to the procedure, a History and Physical                            was performed, and patient medications and                            allergies were reviewed. The patient's tolerance of                            previous anesthesia was also reviewed. The risks                            and benefits of the procedure and the sedation                            options and risks were discussed with the patient.                            All questions were answered, and informed consent                            was obtained. Prior Anticoagulants: The patient has  taken Plavix (clopidogrel), last dose was 1 day                            prior to procedure. ASA Grade Assessment: IV - A                            patient with severe systemic disease that is a                            constant threat to life. After reviewing the risks                            and benefits, the patient was deemed in   satisfactory condition to undergo the procedure.                           After obtaining informed consent, the endoscope was                            passed under direct vision. Throughout the                            procedure, the patient's blood pressure, pulse, and                            oxygen saturations were monitored continuously. The                            EG-[id number] ([id number]) scope was introduced through the                            mouth, and advanced to the second part of duodenum.                            The upper GI endoscopy was somewhat difficult due                            to excessive bleeding. The patient tolerated the                            procedure well. Scope In: Scope Out: Findings:      Patchy candidiasis was found in the entire esophagus.      A low-grade of narrowing Schatzki ring (acquired) was found at the       gastroesophageal junction. The upper adult endoscope passed easily but       there was some mild oozing at the ring after scope passage. This stopped       spontaneously.      Patchy moderate inflammation characterized by shallow ulcerations was       found in the gastric antrum, in the prepyloric region of the stomach and       at the pylorus. No active bleeding from the stomach.      Multiple non-obstructing ulcerations found in the duodenal sweep and       second  portion of duodneum. The ulcers varied from cratered, linear and       superficial. One located just distal to the duodenal sweep was actively       bleeding vigorously. A visible vessel was found within this ulcer. The       largest lesion was 15 mm in largest dimension. Area was first injected       in 4 quadrants around the actively bleeding ulcer with 4 mL of a       1:10,000 solution of epinephrine for hemostasis. Bleeding slowed only       transiently. To stop active bleeding, four hemostatic clips were       successfully placed (1 Cook and 3 Resolution).  After clipping there was       no further evidence of active bleeding. The other ulcerations that were       seen were not actively bleeding, but several had dark pigmented       material, though no other visible vessels were seen. Impression:               - Monilial esophagitis.                           - Low-grade of narrowing Schatzki ring at GE                            junction.                           - Non-bleeding ulcerative gastritis.                           - Multiple duodenal ulcers, 1 actively bleeding                            with a visible vessel. Treatment with epinephrine                            injection and placement of 4 hemostatic clips                           - Suspect gastric and duodenal ulcer disease due to                            stress and chemotherapy. Moderate Sedation:      N/A Recommendation:           - Return patient to ICU for ongoing care.                           - NPO overnight.                           - Continue present medications, including PPI                            infusion for at least 72 hours.                           -  Repeat Hgb today at 16:20 after 2 u pRBC,                            additional transfusions as needed to maintain Hgb >                            7.0 g/dL.                           - No NSAIDs.                           - If ongoing active bleeding then consult IR for                            emergent embolization of gastroduodenal artery for                            duodenal ulcer hemorrhage.                           - If further bleeding consider platelet transfusion                            given recent clopidogrel use. Procedure Code(s):        --- Professional ---                           [phone], Esophagogastroduodenoscopy, flexible,                            transoral; with control of bleeding, any method Diagnosis Code(s):        --- Professional ---                           B37.81,  Candidal esophagitis                           K22.2, Esophageal obstruction                           K29.70, Gastritis, unspecified, without bleeding                           K26.4, Chronic or unspecified duodenal ulcer with                            hemorrhage                           D62, Acute posthemorrhagic anemia                           K92.1, Melena (includes Hematochezia)                           K92.2, Gastrointestinal hemorrhage, unspecified CPT copyright 2016 [employer]. All rights  reserved. The codes documented in this report are preliminary and upon coder review may  be revised to meet current compliance requirements. Jerene Bears, MD 09/02/2016 4:09:13 PM This report has been signed electronically. Number of Addenda: 0

## 2016-09-02 NOTE — Progress Notes (Signed)
PHARMACY - PHYSICIAN COMMUNICATION CRITICAL VALUE ALERT - BLOOD CULTURE IDENTIFICATION (BCID)  Results for orders placed or performed during the hospital encounter of 09/01/16  Blood Culture ID Panel (Reflexed) (Collected: 09/01/2016  1:08 PM)  Result Value Ref Range   Enterococcus species NOT DETECTED NOT DETECTED   Listeria monocytogenes NOT DETECTED NOT DETECTED   Staphylococcus species NOT DETECTED NOT DETECTED   Staphylococcus aureus NOT DETECTED NOT DETECTED   Streptococcus species NOT DETECTED NOT DETECTED   Streptococcus agalactiae NOT DETECTED NOT DETECTED   Streptococcus pneumoniae NOT DETECTED NOT DETECTED   Streptococcus pyogenes NOT DETECTED NOT DETECTED   Acinetobacter baumannii NOT DETECTED NOT DETECTED   Enterobacteriaceae species NOT DETECTED NOT DETECTED   Enterobacter cloacae complex NOT DETECTED NOT DETECTED   Escherichia coli NOT DETECTED NOT DETECTED   Klebsiella oxytoca NOT DETECTED NOT DETECTED   Klebsiella pneumoniae NOT DETECTED NOT DETECTED   Proteus species NOT DETECTED NOT DETECTED   Serratia marcescens NOT DETECTED NOT DETECTED   Haemophilus influenzae NOT DETECTED NOT DETECTED   Neisseria meningitidis NOT DETECTED NOT DETECTED   Pseudomonas aeruginosa NOT DETECTED NOT DETECTED   Candida albicans NOT DETECTED NOT DETECTED   Candida glabrata NOT DETECTED NOT DETECTED   Candida krusei NOT DETECTED NOT DETECTED   Candida parapsilosis NOT DETECTED NOT DETECTED   Candida tropicalis NOT DETECTED NOT DETECTED    Name of physician (or Provider) Contacted: Chaney Malling  Changes to prescribed antibiotics required:  Continue current abx for now  Royetta Asal, PharmD, BCPS Pager [phone] 09/02/2016 10:17 PM

## 2016-09-02 NOTE — Progress Notes (Signed)
Pt arrived from Endo in floor bed.  Pt drowsy, awakens on calling, oriented x 4.  VSS.  Pt w/ small amt of bloody stool w/ clots.  Pt oriented to callbell and environment.  POC discussed w/ pt.  Will monitor closely.

## 2016-09-02 NOTE — Anesthesia Procedure Notes (Signed)
Procedure Name: Intubation Date/Time: 09/02/2016 2:51 PM Performed by: Dione Booze Pre-anesthesia Checklist: Emergency Drugs available, Suction available, Patient being monitored and Patient identified Patient Re-evaluated:Patient Re-evaluated prior to inductionOxygen Delivery Method: Circle system utilized Preoxygenation: Pre-oxygenation with 100% oxygen Intubation Type: IV induction, Rapid sequence and Cricoid Pressure applied Laryngoscope Size: Mac and 4 Grade View: Grade I Tube type: Subglottic suction tube Tube size: 7.5 mm Number of attempts: 1 Airway Equipment and Method: Stylet Placement Confirmation: ETT inserted through vocal cords under direct vision,  positive ETCO2 and breath sounds checked- equal and bilateral Secured at: 22 cm Tube secured with: Tape Dental Injury: Teeth and Oropharynx as per pre-operative assessment

## 2016-09-02 NOTE — Progress Notes (Signed)
Pt with active bleeding.  Blood from rectum moderate to large x3.  Bloody emesis overnight plus several bloody stools.  Hgb dropped form 10.3 to 7.5 this morning.  Pt with c/o increased weakness and of feeling faint.  Orders to transfer to stepdown.

## 2016-09-03 ENCOUNTER — Inpatient Hospital Stay (HOSPITAL_COMMUNITY): Payer: Medicare Other

## 2016-09-03 DIAGNOSIS — N183 Chronic kidney disease, stage 3 (moderate): Secondary | ICD-10-CM

## 2016-09-03 DIAGNOSIS — E785 Hyperlipidemia, unspecified: Secondary | ICD-10-CM

## 2016-09-03 DIAGNOSIS — D631 Anemia in chronic kidney disease: Secondary | ICD-10-CM

## 2016-09-03 DIAGNOSIS — M79609 Pain in unspecified limb: Secondary | ICD-10-CM

## 2016-09-03 DIAGNOSIS — E1169 Type 2 diabetes mellitus with other specified complication: Secondary | ICD-10-CM

## 2016-09-03 DIAGNOSIS — N184 Chronic kidney disease, stage 4 (severe): Secondary | ICD-10-CM

## 2016-09-03 LAB — BASIC METABOLIC PANEL
Anion gap: 8 (ref 5–15)
BUN: 68 mg/dL — ABNORMAL HIGH (ref 6–20)
CALCIUM: 7.3 mg/dL — AB (ref 8.9–10.3)
CHLORIDE: 116 mmol/L — AB (ref 101–111)
CO2: 16 mmol/L — ABNORMAL LOW (ref 22–32)
CREATININE: 2.6 mg/dL — AB (ref 0.44–1.00)
GFR, EST AFRICAN AMERICAN: 18 mL/min — AB (ref >60)
GFR, EST NON AFRICAN AMERICAN: 16 mL/min — AB (ref >60)
Glucose, Bld: 197 mg/dL — ABNORMAL HIGH (ref 65–99)
Potassium: 4.4 mmol/L (ref 3.5–5.1)
SODIUM: 140 mmol/L (ref 135–145)

## 2016-09-03 LAB — HEMOGLOBIN A1C
HEMOGLOBIN A1C: 9 % — AB (ref 4.8–5.6)
MEAN PLASMA GLUCOSE: 212 mg/dL

## 2016-09-03 LAB — GLUCOSE, CAPILLARY
GLUCOSE-CAPILLARY: 101 mg/dL — AB (ref 65–99)
GLUCOSE-CAPILLARY: 106 mg/dL — AB (ref 65–99)
Glucose-Capillary: 163 mg/dL — ABNORMAL HIGH (ref 65–99)
Glucose-Capillary: 126 mg/dL — ABNORMAL HIGH (ref 65–99)

## 2016-09-03 LAB — CBC
HCT: 24.8 % — ABNORMAL LOW (ref 36.0–46.0)
HEMATOCRIT: 21.5 % — AB (ref 36.0–46.0)
HEMOGLOBIN: 8.5 g/dL — AB (ref 12.0–15.0)
HEMOGLOBIN: 7.3 g/dL — AB (ref 12.0–15.0)
MCH: 30.1 pg (ref 26.0–34.0)
MCH: 30.5 pg (ref 26.0–34.0)
MCHC: 34.3 g/dL (ref 30.0–36.0)
MCHC: 34 g/dL (ref 30.0–36.0)
MCV: 87.9 fL (ref 78.0–100.0)
MCV: 90 fL (ref 78.0–100.0)
PLATELETS: 79 10*3/uL — AB (ref 150–400)
Platelets: 64 10*3/uL — ABNORMAL LOW (ref 150–400)
RBC: 2.82 MIL/uL — ABNORMAL LOW (ref 3.87–5.11)
RBC: 2.39 MIL/uL — AB (ref 3.87–5.11)
RDW: 16.2 % — AB (ref 11.5–15.5)
RDW: 16.5 % — AB (ref 11.5–15.5)
WBC: 24.3 10*3/uL — ABNORMAL HIGH (ref 4.0–10.5)
WBC: 18.8 10*3/uL — AB (ref 4.0–10.5)

## 2016-09-03 LAB — LEGIONELLA PNEUMOPHILA SEROGP 1 UR AG: L. pneumophila Serogp 1 Ur Ag: NEGATIVE

## 2016-09-03 LAB — CULTURE, BLOOD (ROUTINE X 2): Special Requests: ADEQUATE

## 2016-09-03 LAB — HEMOGLOBIN AND HEMATOCRIT, BLOOD
HCT: 21.4 % — ABNORMAL LOW (ref 36.0–46.0)
Hemoglobin: 7.3 g/dL — ABNORMAL LOW (ref 12.0–15.0)

## 2016-09-03 LAB — HEMOGLOBIN: HEMOGLOBIN: 7.2 g/dL — AB (ref 12.0–15.0)

## 2016-09-03 LAB — LACTIC ACID, PLASMA: Lactic Acid, Venous: 1.2 mmol/L (ref 0.5–1.9)

## 2016-09-03 MED ORDER — ZOLPIDEM TARTRATE 5 MG PO TABS
5.0000 mg | ORAL_TABLET | Freq: Once | ORAL | Status: AC
Start: 2016-09-03 — End: 2016-09-03
  Administered 2016-09-03: 5 mg via ORAL
  Filled 2016-09-03: qty 1

## 2016-09-03 MED ORDER — FLUCONAZOLE 100MG IVPB
100.0000 mg | INTRAVENOUS | Status: DC
  Administered 2016-09-04 – 2016-09-05 (×2): 100 mg via INTRAVENOUS
  Filled 2016-09-03 (×2): qty 50

## 2016-09-03 MED ORDER — FLUCONAZOLE IN SODIUM CHLORIDE 200-0.9 MG/100ML-% IV SOLN
200.0000 mg | Freq: Once | INTRAVENOUS | Status: AC
  Administered 2016-09-03: 200 mg via INTRAVENOUS
  Filled 2016-09-03: qty 100

## 2016-09-03 NOTE — Progress Notes (Signed)
CRITICAL VALUE ALERT  Critical value received: Lactic acid 2.8  Date of notification:  09/02/16  Time of notification: 2152  Critical value read back:Yes  Nurse who received alert:  Jamee Pacholski K  MD notified (1st page):  yes  Time of first page:  2155  MD notified (2nd page):  Time of second page:  Responding MD:  Schorr  Time MD responded:

## 2016-09-03 NOTE — Care Management Note (Signed)
Case Management Note  Patient Details  Name: Erica Jimenez MRN: [id number] Date of Birth: 1930/01/23  Subjective/Objective:    sepsis                Action/Plan: From home will follow for dc needs   Expected Discharge Date:  [id number]           Expected Discharge Plan:  Home/Self Care  In-House Referral:     Discharge planning Services     Post Acute Care Choice:    Choice offered to:     DME Arranged:    DME Agency:     HH Arranged:    HH Agency:     Status of Service:  In process, will continue to follow  If discussed at Long Length of Stay Meetings, dates discussed:    Additional Comments:  Leeroy Cha, RN 09/03/2016, 10:10 AM

## 2016-09-03 NOTE — Progress Notes (Addendum)
PROGRESS NOTE [facility]   Erica Jimenez   WLN:[id number] DOB: 1930/04/20  DOA: 09/01/2016 PCP: [name] (Inactive)   Brief Narrative:  83 year old female with medical history significant for lung cancer, diabetes mellitus, hypertension, chronic systolic heart failure who presented to the ED with ongoing cough and shortness of breath with exertion. She was admitted for suspected sepsis secondary to possible HCAP, and was started on broad-spectrum IV antibiotic. On the night of 4/30-5/1, she report to bloody bowel movement and coffee-ground emesis GI was consulted and emergent EGD was done which found esophageal candidiasis and bleeding ulcer which were hemostasis with epinephrine and clips. Patient also was given to 2 units of PRBC's.  Subjective: Patient seen and examined this morning, doing well she report no bloody stools or bloody emesis. Patient denies chest pain, shortness of breath, palpitations, and dizziness. Patient reports that she is not having good appetite but had some ice chips with no problem. She was found to be of A. fib on the monitor, with no symptoms patient reported that she has been told in the past that she have an irregular rhythm but not having any treatment.  Assessment & Plan: Sepsis secondary to HCAP - specific etiology has resolved Checks x-ray shows some right anterior lymphadenopathy and multifocal masses which is improving from last study, although patient with cough and general malaise and procalcitonin of 1.08 Patient initially started on vancomycin and Zosyn. Vancomycin discontinued on 09/02/2016 - MRSA negative Continue Zosyn for now - will order procalcitonin if trending down can de-escalate antibiotic treatment Blood cultures thus far NGTD  Continue supportive treatment   Acute blood loss anemia secondary to PUD - stable  Status post EGD with hemostasis Status post 2 units of PRBCs FOBT was positive Holding aspirin and Plavix Protonix  twice a day GI recommendations appreciated Monitor H&H every 12 hours  Esophageal candidiasis Diflucan per pharmacy  Acute on chronic kidney disease stage IV Baseline creatinine is 2.3, today is 2.6 likely secondary to blood loss from hyperperfusion. Continue IV fluids for now - monitor for signs of fluid overload Continue to hold Lasix Check BMP in the morning  Afib - per patient she has had this in the past,  EKG ordered and did independent review, shows Afib with RVR, and PVCs CHA2DS2VASc > 5, patient needs a/c but with recent bleed will hold on that. She can discuss further therapy as outpatient  If HR start to increase will consider switching Zebeta for Lopressor  Continue to monitor in tele.  Check TSH   Essential hypertension - slight low today  Continue to monitor   Coronary artery disease, history of CVA Holding aspirin and Plavix Asymptomatic Continue to monitor  Chronic systolic CHF  Seems to be compensated Continue home medication, except Lasix which is on hold due to increasing creatinine Monitor daily weight  Diabetes mellitus type 2 Continue Lantus and SSI Monitor CBGs  Stage III non-small cell lung cancer  Diagnosis in March 2018 Follow-up with Dr. [name] as an outpatient.  DVT prophylaxis: SCD's  Code Status: FULL  Family Communication: None at bedside  Disposition Plan: Home when medically stable   Consultants:   GI   Procedures:   EGD done on May 1 of 2018  Antimicrobials: Anti-infectives    Start     Dose/Rate Route Frequency Ordered Stop   09/04/16 1000  fluconazole (DIFLUCAN) IVPB 100 mg     100 mg 50 mL/hr over 60 Minutes Intravenous Every 24 hours 09/03/16 0844  09/03/16 0900  fluconazole (DIFLUCAN) IVPB 200 mg     200 mg 100 mL/hr over 60 Minutes Intravenous  Once 09/03/16 0842 09/03/16 1038   09/01/16 1515  piperacillin-tazobactam (ZOSYN) IVPB 3.375 g  Status:  Discontinued     3.375 g 100 mL/hr over 30 Minutes Intravenous   Once 09/01/16 1502 09/01/16 1504   09/01/16 1515  vancomycin (VANCOCIN) IVPB 1000 mg/200 mL premix  Status:  Discontinued     1,000 mg 200 mL/hr over 60 Minutes Intravenous  Once 09/01/16 1502 09/01/16 1505   09/01/16 1415  piperacillin-tazobactam (ZOSYN) IVPB 2.25 g     2.25 g 100 mL/hr over 30 Minutes Intravenous Every 6 hours 09/01/16 1400     09/01/16 1400  piperacillin-tazobactam (ZOSYN) IVPB 3.375 g  Status:  Discontinued     3.375 g 100 mL/hr over 30 Minutes Intravenous  Once 09/01/16 1355 09/01/16 1400   09/01/16 1400  vancomycin (VANCOCIN) IVPB 1000 mg/200 mL premix     1,000 mg 200 mL/hr over 60 Minutes Intravenous  Once 09/01/16 1355 09/01/16 1516        Objective: Vitals:   09/03/16 0400 09/03/16 0600 09/03/16 0700 09/03/16 0800  BP: (!) 116/44 108/85 (!) 119/25 (!) 121/54  Pulse: 63 62 65 (!) 56  Resp: (!) 31 18 (!) 34 (!) 30  Temp:      TempSrc:      SpO2: 97% 98% 98% 97%  Weight:      Height:        Intake/Output Summary (Last 24 hours) at 09/03/16 0851 Last data filed at 09/03/16 0800  Gross per 24 hour  Intake          3230.83 ml  Output              400 ml  Net          2830.83 ml   Filed Weights   09/01/16 1630 09/02/16 1200 09/02/16 1345  Weight: 63.2 kg (139 lb 5.3 oz) 64.8 kg (142 lb 13.7 oz) 64.8 kg (142 lb 13.7 oz)    Examination:  General exam: Appears calm and comfortable  HEENT: OP moist and clear Respiratory system: Clear to auscultation. No wheezes,crackle or rhonchi Cardiovascular system: S1 & S2 heard, RRR. No JVD, murmurs, rubs or gallops Gastrointestinal system: Abdomen is nondistended, soft and nontender. Normal bowel sounds. Central nervous system: Alert and oriented. No focal neurological deficits. Extremities: No pedal edema. Skin: No rashes, lesions or ulcers Psychiatry: Mood & affect appropriate.    Data Reviewed: I have personally reviewed following labs and imaging studies  CBC:  Recent Labs Lab 09/01/16 1308  09/02/16 0747 09/02/16 1652 09/02/16 2151 09/03/16 0330  WBC 27.0* 19.7* 29.8* 32.7* 24.3*  NEUTROABS 21.3*  --   --   --   --   HGB 10.3* 7.5* 9.4* 9.4* 8.5*  HCT 31.8* 23.4* 27.9* 27.1* 24.8*  MCV 90.1 90.3 90.3 88.3 87.9  PLT 152 124* 82* 80* 79*   Basic Metabolic Panel:  Recent Labs Lab 09/01/16 1308 09/03/16 0330  NA 130* 140  K 4.2 4.4  CL 103 116*  CO2 17* 16*  GLUCOSE 326* 197*  BUN 68* 68*  CREATININE 2.30* 2.60*  CALCIUM 8.3* 7.3*   GFR: Estimated Creatinine Clearance: 14.5 mL/min (A) (by C-G formula based on SCr of 2.6 mg/dL (H)). Liver Function Tests:  Recent Labs Lab 09/01/16 1308  AST 15  ALT 11*  ALKPHOS 105  BILITOT 0.2*  PROT 6.0*  ALBUMIN 2.9*    Recent Labs Lab 09/01/16 1308  LIPASE 22   No results for input(s): AMMONIA in the last 168 hours. Coagulation Profile:  Recent Labs Lab 09/02/16 1652  INR 1.38   Cardiac Enzymes: No results for input(s): CKTOTAL, CKMB, CKMBINDEX, TROPONINI in the last 168 hours. BNP (last 3 results) No results for input(s): PROBNP in the last 8760 hours. HbA1C:  Recent Labs  09/01/16 1825  HGBA1C 9.0*   CBG:  Recent Labs Lab 09/02/16 0722 09/02/16 1238 09/02/16 1656 09/02/16 2122 09/03/16 0755  GLUCAP 208* 236* 231* 223* 163*   Lipid Profile: No results for input(s): CHOL, HDL, LDLCALC, TRIG, CHOLHDL, LDLDIRECT in the last 72 hours. Thyroid Function Tests: No results for input(s): TSH, T4TOTAL, FREET4, T3FREE, THYROIDAB in the last 72 hours. Anemia Panel: No results for input(s): VITAMINB12, FOLATE, FERRITIN, TIBC, IRON, RETICCTPCT in the last 72 hours. Sepsis Labs:  Recent Labs Lab 09/01/16 1825 09/02/16 1652 09/02/16 2151 09/03/16 0330  PROCALCITON 1.08  --   --   --   LATICACIDVEN 2.3* 3.5* 2.8* 1.2    Recent Results (from the past 240 hour(s))  Blood Culture (routine x 2)     Status: None (Preliminary result)   Collection Time: 09/01/16  1:08 PM  Result Value Ref Range  Status   Specimen Description BLOOD LEFT WRIST  Final   Special Requests   Final    BOTTLES DRAWN AEROBIC ONLY Blood Culture adequate volume   Culture  Setup Time   Final    GRAM POSITIVE COCCI IN PAIRS AEROBIC BOTTLE ONLY Organism ID to follow CRITICAL RESULT CALLED TO, READ BACK BY AND VERIFIED WITHGuadlupe [facility] 2153 09/02/16 A BROWNING Performed at [facility], [place] [address], [place], [place] [address]    Culture GRAM POSITIVE COCCI  Final   Report Status PENDING  Incomplete  Blood Culture ID Panel (Reflexed)     Status: None   Collection Time: 09/01/16  1:08 PM  Result Value Ref Range Status   Enterococcus species NOT DETECTED NOT DETECTED Final   Listeria monocytogenes NOT DETECTED NOT DETECTED Final   Staphylococcus species NOT DETECTED NOT DETECTED Final   Staphylococcus aureus NOT DETECTED NOT DETECTED Final   Streptococcus species NOT DETECTED NOT DETECTED Final   Streptococcus agalactiae NOT DETECTED NOT DETECTED Final   Streptococcus pneumoniae NOT DETECTED NOT DETECTED Final   Streptococcus pyogenes NOT DETECTED NOT DETECTED Final   Acinetobacter baumannii NOT DETECTED NOT DETECTED Final   Enterobacteriaceae species NOT DETECTED NOT DETECTED Final   Enterobacter cloacae complex NOT DETECTED NOT DETECTED Final   Escherichia coli NOT DETECTED NOT DETECTED Final   Klebsiella oxytoca NOT DETECTED NOT DETECTED Final   Klebsiella pneumoniae NOT DETECTED NOT DETECTED Final   Proteus species NOT DETECTED NOT DETECTED Final   Serratia marcescens NOT DETECTED NOT DETECTED Final   Haemophilus influenzae NOT DETECTED NOT DETECTED Final   Neisseria meningitidis NOT DETECTED NOT DETECTED Final   Pseudomonas aeruginosa NOT DETECTED NOT DETECTED Final   Candida albicans NOT DETECTED NOT DETECTED Final   Candida glabrata NOT DETECTED NOT DETECTED Final   Candida krusei NOT DETECTED NOT DETECTED Final   Candida parapsilosis NOT DETECTED NOT DETECTED Final   Candida  tropicalis NOT DETECTED NOT DETECTED Final    Comment: Performed at [facility], [place]. [address], [place], [place] [address]  Blood Culture (routine x 2)     Status: None (Preliminary result)   Collection  Time: 09/01/16  2:28 PM  Result Value Ref Range Status   Specimen Description BLOOD RIGHT HAND  Final   Special Requests   Final    BOTTLES DRAWN AEROBIC AND ANAEROBIC Blood Culture adequate volume   Culture   Final    NO GROWTH < 24 HOURS Performed at [facility], [address], [place], [place] [address]    Report Status PENDING  Incomplete  MRSA PCR Screening     Status: None   Collection Time: 09/02/16  9:51 AM  Result Value Ref Range Status   MRSA by PCR NEGATIVE NEGATIVE Final    Comment:        The GeneXpert MRSA Assay (FDA approved for NASAL specimens only), is one component of a comprehensive MRSA colonization surveillance program. It is not intended to diagnose MRSA infection nor to guide or monitor treatment for MRSA infections.          Radiology Studies: Dg Chest 2 View  Result Date: 09/01/2016 CLINICAL DATA:  83 year old with current history of right upper and lower lobe lung cancer for which she is undergoing treatment, presenting with progressively worsening fatigue, cough, chest congestion and shortness of breath over the past few days. EXAM: CHEST  2 VIEW COMPARISON:  08/24/2016, 08/17/2016 and earlier, including PET-CT 07/30/2016. FINDINGS: AP semi-erect and lateral images were obtained. Cardiac CT mildly enlarged for AP technique, unchanged. Thoracic aorta atherosclerotic, unchanged. Right hilar lymphadenopathy and the multifocal masses throughout the right lung identified on the examinations 1 month ago have shown significant interval improvement. No new pulmonary parenchymal abnormalities in either lung. No pleural effusions. Visualized bony thorax intact. IMPRESSION: Improving right hilar lymphadenopathy and the multifocal masses throughout  the right lung. No new/acute cardiopulmonary disease. Stable mild cardiomegaly without pulmonary edema. Electronically Signed   By: Evangeline Dakin M.D.   On: 09/01/2016 11:55      Scheduled Meds: . atorvastatin  80 mg Oral QPC supper  . bisoprolol  2.5 mg Oral QHS  . brinzolamide  1 drop Both Eyes BID  . calcitRIOL  0.25 mcg Oral Q M,W,F  . citalopram  20 mg Oral QPC supper  . febuxostat  40 mg Oral Daily  . feeding supplement  1 Container Oral TID BM  . feeding supplement (ENSURE ENLIVE)  237 mL Oral BID BM  . ferrous gluconate  324 mg Oral QPC supper  . folic acid  1 mg Oral Daily  . hydrALAZINE  50 mg Oral BID  . insulin aspart  0-9 Units Subcutaneous TID WC  . insulin glargine  12 Units Subcutaneous QAC breakfast  . isosorbide mononitrate  60 mg Oral QPC supper  . latanoprost  1 drop Both Eyes QHS  . levothyroxine  50 mcg Oral QPC supper  . mirtazapine  15 mg Oral QHS   Continuous Infusions: . sodium chloride 75 mL/hr at 09/02/16 1826  . [START ON 09/04/2016] fluconazole (DIFLUCAN) IV    . fluconazole (DIFLUCAN) IV    . pantoprozole (PROTONIX) infusion 8 mg/hr (09/03/16 0003)  . piperacillin-tazobactam (ZOSYN)  IV Stopped (09/03/16 0530)     LOS: 2 days    Time spent: Total of 25 minutes spent with pt, greater than 50% of which was spent in discussion of  treatment, counseling and coordination of care    Chipper Oman, MD Pager: Text Page via [email or web address]  [phone]  If 7PM-7AM, please contact night-coverage [email or web address] Password [email or web address] 09/03/2016, 8:51 AM

## 2016-09-03 NOTE — Anesthesia Postprocedure Evaluation (Signed)
Anesthesia Post Note  Patient: Erica Jimenez  Procedure(s) Performed: Procedure(s) (LRB): ESOPHAGOGASTRODUODENOSCOPY (EGD) (N/A)  Patient location during evaluation: PACU Anesthesia Type: MAC Level of consciousness: awake and alert Pain management: pain level controlled Vital Signs Assessment: post-procedure vital signs reviewed and stable Respiratory status: spontaneous breathing, nonlabored ventilation, respiratory function stable and patient connected to nasal cannula oxygen Cardiovascular status: stable and blood pressure returned to baseline Anesthetic complications: no       Last Vitals:  Vitals:   09/03/16 0700 09/03/16 0800  BP: (!) 119/25 (!) 121/54  Pulse: 65 (!) 56  Resp: (!) 34 (!) 30  Temp:      Last Pain:  Vitals:   09/03/16 0800  TempSrc:   PainSc: 0-No pain                 Ryver Poblete S

## 2016-09-03 NOTE — Progress Notes (Signed)
VASCULAR LAB PRELIMINARY  PRELIMINARY  PRELIMINARY  PRELIMINARY  Right lower extremity venous duplex completed.    Preliminary report:  Right:  No evidence of DVT, superficial thrombosis, or Baker's cyst.  Alexandar Weisenberger, RVS 09/03/2016, 4:01 PM

## 2016-09-03 NOTE — Progress Notes (Signed)
Progress Note   Subjective  Chief Complaint:Upper Gi Bleed  This morning the patient is found laying in bed getting an EKG. She tells me that she did better overnight and has had no further melenic stools. In fact, she has not had a stool at all since time of procedure yesterday. She did suck on some ice but tells me that she does not have an appetite at all. She denies dizziness or syncope.   Objective   Vital signs in last 24 hours: Temp:  [97.3 F (36.3 C)-98.6 F (37 C)] 98.5 F (36.9 C) (05/02 0318) Pulse Rate:  [39-79] 56 (05/02 0800) Resp:  [15-38] 30 (05/02 0800) BP: (80-177)/(19-103) 121/54 (05/02 0800) SpO2:  [90 %-100 %] 97 % (05/02 0800) Weight:  [142 lb 13.7 oz (64.8 kg)] 142 lb 13.7 oz (64.8 kg) (05/01 1345) Last BM Date: 09/02/16 General: African-American female in NAD Heart:  Regular rate and rhythm; no murmurs Lungs: Respirations even and unlabored, lungs CTA bilaterally Abdomen:  Soft, nontender and nondistended. Normal bowel sounds. Extremities:  Without edema. Neurologic:  Alert and oriented,  grossly normal neurologically. Psych:  Cooperative. Normal mood and affect.  Intake/Output from previous day: 05/01 0701 - 05/02 0700 In: 2880.8 [I.V.:2060.8; Blood:670; IV Piggyback:150] Out: 400 [Urine:400] Intake/Output this shift: Total I/O In: 350 [I.V.:350] Out: -   Lab Results:  Recent Labs  09/02/16 1652 09/02/16 2151 09/03/16 0330  WBC 29.8* 32.7* 24.3*  HGB 9.4* 9.4* 8.5*  HCT 27.9* 27.1* 24.8*  PLT 82* 80* 79*   BMET  Recent Labs  09/01/16 1308 09/03/16 0330  NA 130* 140  K 4.2 4.4  CL 103 116*  CO2 17* 16*  GLUCOSE 326* 197*  BUN 68* 68*  CREATININE 2.30* 2.60*  CALCIUM 8.3* 7.3*   LFT  Recent Labs  09/01/16 1308  PROT 6.0*  ALBUMIN 2.9*  AST 15  ALT 11*  ALKPHOS 105  BILITOT 0.2*   PT/INR  Recent Labs  09/02/16 1652  LABPROT 17.1*  INR 1.38    Studies/Results: Dg Chest 2 View  Result Date:  09/01/2016 CLINICAL DATA:  83 year old with current history of right upper and lower lobe lung cancer for which she is undergoing treatment, presenting with progressively worsening fatigue, cough, chest congestion and shortness of breath over the past few days. EXAM: CHEST  2 VIEW COMPARISON:  08/24/2016, 08/17/2016 and earlier, including PET-CT 07/30/2016. FINDINGS: AP semi-erect and lateral images were obtained. Cardiac CT mildly enlarged for AP technique, unchanged. Thoracic aorta atherosclerotic, unchanged. Right hilar lymphadenopathy and the multifocal masses throughout the right lung identified on the examinations 1 month ago have shown significant interval improvement. No new pulmonary parenchymal abnormalities in either lung. No pleural effusions. Visualized bony thorax intact. IMPRESSION: Improving right hilar lymphadenopathy and the multifocal masses throughout the right lung. No new/acute cardiopulmonary disease. Stable mild cardiomegaly without pulmonary edema. Electronically Signed   By: Evangeline Dakin M.D.   On: 09/01/2016 11:55   EGD Dr. Pyrtle-09/02/16 Findings:      Patchy candidiasis was found in the entire esophagus.      A low-grade of narrowing Schatzki ring (acquired) was found at the       gastroesophageal junction. The upper adult endoscope passed easily but       there was some mild oozing at the ring after scope passage. This stopped       spontaneously.      Patchy moderate inflammation characterized by shallow ulcerations was  found in the gastric antrum, in the prepyloric region of the stomach and       at the pylorus. No active bleeding from the stomach.      Multiple non-obstructing ulcerations found in the duodenal sweep and       second portion of duodneum. The ulcers varied from cratered, linear and       superficial. One located just distal to the duodenal sweep was actively       bleeding vigorously. A visible vessel was found within this ulcer. The        largest lesion was 15 mm in largest dimension. Area was first injected       in 4 quadrants around the actively bleeding ulcer with 4 mL of a       1:10,000 solution of epinephrine for hemostasis. Bleeding slowed only       transiently. To stop active bleeding, four hemostatic clips were       successfully placed (1 Cook and 3 Resolution). After clipping there was       no further evidence of active bleeding. The other ulcerations that were       seen were not actively bleeding, but several had dark pigmented       material, though no other visible vessels were seen. Impression:               - Monilial esophagitis.                           - Low-grade of narrowing Schatzki ring at GE                            junction.                           - Non-bleeding ulcerative gastritis.                           - Multiple duodenal ulcers, 1 actively bleeding                            with a visible vessel. Treatment with epinephrine                            injection and placement of 4 hemostatic clips                           - Suspect gastric and duodenal ulcer disease due to                            stress and chemotherapy. Moderate Sedation:      N/A Recommendation:           - Return patient to ICU for ongoing care.                           - NPO overnight.                           - Continue present medications, including PPI  infusion for at least 72 hours.                           - Repeat Hgb today at 16:20 after 2 u pRBC,                            additional transfusions as needed to maintain Hgb >                            7.0 g/dL.                           - No NSAIDs.                           - If ongoing active bleeding then consult IR for                            emergent embolization of gastroduodenal artery for                            duodenal ulcer hemorrhage.                           - If further bleeding consider platelet  transfusion                            given recent clopidogrel use.   Assessment / Plan:    Assessment: 1. Upper GI bleed:EGD yesterday with results as above including a bleeding duodenal ulcer which was treated with epinephrine and 4 hemostatic clips, no melenic bowel movements since, hemoglobin down from 9.4-8.5 this morning, no further abdominal pain, patient feeling well 2. Acute blood loss anemia:as above 3. Stage III non-small cell lung cancer 4. Chronic kidney disease stage IV 5. Sepsis secondary to lobar pneumonia  Plan: 1. Continue hemoglobin Q8H with transfusion as needed less than 7 2. Continue Pantoprazole drip for at least 72 hours 3. Continue to hold Plavix and aspirin 4. Again, per Dr. Hilarie Fredrickson if ongoing active bleeding then consult IR for emergent embolization of gastroduodenal artery for duodenal ulcer hemorrhage also may need to consider platelet transfusion given recent clopidogrel use 5. Could consider clear liquid diet today as long as patient has no further acute bleeding this morning 6. Please await any further recommendations from Dr. Hilarie Fredrickson  Thank you for your kind consultation, we will continue to follow   LOS: 2 days   Levin Erp  09/03/2016, 9:39 AM  Pager # [phone]

## 2016-09-03 NOTE — Progress Notes (Signed)
  Patient's Hgb was 7.3. No change in value from previous Hgb. Dr. Laurence Ferrari aware. Per MD will hold off on IR procedure and continue to monitor patient for any change in condition.

## 2016-09-03 NOTE — Progress Notes (Addendum)
Pharmacy Antibiotic Note  Erica Jimenez is a 83 y.o. female with history of lung cancer admitted on 09/01/2016 with generalized weakness for about a week.  Patient initially placed on Vancomycin and Zosyn for sepsis secondary to HCAP, subsequently narrowed to Zosyn. Pharmacy consulted today to start Fluconazole for esophageal candidiasis.   09/03/16  SCr elevated at 2.6 (increasing) with CrCl ~ 15 ml/min WBC elevated, but trending down Lactic acid 2.24 > 1.66 > 2.3 > 3.5 > 2.8 > 1.2  Plan: -Continue Zosyn 2.25g IV q6h -Fluconazole '200mg'$  IV x 1, then '100mg'$  IV q24h. Discussed potential drug interaction with citalopram and atorvastatin with MD, and he will monitor pt closely. -Follow renal function, cultures, and clinical course.  Height: '5\' 5"'$  (165.1 cm) Weight: 142 lb 13.7 oz (64.8 kg) IBW/kg (Calculated) : 57  Temp (24hrs), Avg:97.9 F (36.6 C), Min:97.3 F (36.3 C), Max:98.6 F (37 C)   Recent Labs Lab 09/01/16 1308 09/01/16 1554 09/01/16 1825 09/02/16 0747 09/02/16 1652 09/02/16 2151 09/03/16 0330  WBC 27.0*  --   --  19.7* 29.8* 32.7* 24.3*  CREATININE 2.30*  --   --   --   --   --  2.60*  LATICACIDVEN  --  1.66 2.3*  --  3.5* 2.8* 1.2    Estimated Creatinine Clearance: 14.5 mL/min (A) (by C-G formula based on SCr of 2.6 mg/dL (H)).    Allergies  Allergen Reactions  . Contrast Media [Iodinated Diagnostic Agents] Other (See Comments)    Renal insufficiency  . Fluoxetine Rash  . Sulfonamide Derivatives Nausea Only    Antimicrobials this admission: 4/30 vanc >> 5/1 4/30 zosyn >> 5/2 fluconazole >>  Dose adjustments this admission: --  Microbiology results: 4/30 BCx: GPC in pairs in aerobic bottle of 1 set only; nothing detected on BCID  5/1 MRSA PCR: negative  Thank you for allowing pharmacy to be a part of this patient's care.    Lindell Spar, PharmD, BCPS Pager: [phone] 09/03/2016 8:53 AM

## 2016-09-03 NOTE — Progress Notes (Signed)
PT Cancellation Note  Patient Details Name: Erica Jimenez MRN: [id number] DOB: February 25, 1930   Cancelled Treatment:     PT order received but eval deferred at pt request 2* nausea.  RN aware.  Will follow.   Kemiah Booz 09/03/2016, 1:49 PM  Pg [phone]

## 2016-09-04 ENCOUNTER — Encounter (HOSPITAL_COMMUNITY): Payer: Self-pay | Admitting: Internal Medicine

## 2016-09-04 ENCOUNTER — Inpatient Hospital Stay (HOSPITAL_COMMUNITY): Payer: Medicare Other

## 2016-09-04 ENCOUNTER — Other Ambulatory Visit: Payer: Medicare Other

## 2016-09-04 DIAGNOSIS — R918 Other nonspecific abnormal finding of lung field: Secondary | ICD-10-CM

## 2016-09-04 DIAGNOSIS — K250 Acute gastric ulcer with hemorrhage: Secondary | ICD-10-CM

## 2016-09-04 LAB — CBC
HCT: 22 % — ABNORMAL LOW (ref 36.0–46.0)
HEMATOCRIT: 23.1 % — AB (ref 36.0–46.0)
HEMOGLOBIN: 7.5 g/dL — AB (ref 12.0–15.0)
HEMOGLOBIN: 7.8 g/dL — AB (ref 12.0–15.0)
MCH: 30.5 pg (ref 26.0–34.0)
MCH: 30.7 pg (ref 26.0–34.0)
MCHC: 34.1 g/dL (ref 30.0–36.0)
MCHC: 33.8 g/dL (ref 30.0–36.0)
MCV: 89.4 fL (ref 78.0–100.0)
MCV: 90.9 fL (ref 78.0–100.0)
PLATELETS: 70 10*3/uL — AB (ref 150–400)
Platelets: 75 10*3/uL — ABNORMAL LOW (ref 150–400)
RBC: 2.46 MIL/uL — ABNORMAL LOW (ref 3.87–5.11)
RBC: 2.54 MIL/uL — AB (ref 3.87–5.11)
RDW: 16.9 % — AB (ref 11.5–15.5)
RDW: 17.2 % — ABNORMAL HIGH (ref 11.5–15.5)
WBC: 19.1 10*3/uL — ABNORMAL HIGH (ref 4.0–10.5)
WBC: 19.8 10*3/uL — AB (ref 4.0–10.5)

## 2016-09-04 LAB — BASIC METABOLIC PANEL
ANION GAP: 6 (ref 5–15)
BUN: 48 mg/dL — ABNORMAL HIGH (ref 6–20)
CALCIUM: 7.4 mg/dL — AB (ref 8.9–10.3)
CHLORIDE: 125 mmol/L — AB (ref 101–111)
CO2: 15 mmol/L — AB (ref 22–32)
Creatinine, Ser: 2.38 mg/dL — ABNORMAL HIGH (ref 0.44–1.00)
GFR calc non Af Amer: 18 mL/min — ABNORMAL LOW (ref >60)
GFR, EST AFRICAN AMERICAN: 20 mL/min — AB (ref >60)
Glucose, Bld: 79 mg/dL (ref 65–99)
Potassium: 3.9 mmol/L (ref 3.5–5.1)
Sodium: 146 mmol/L — ABNORMAL HIGH (ref 135–145)

## 2016-09-04 LAB — TSH: TSH: 3.677 u[IU]/mL (ref 0.350–4.500)

## 2016-09-04 LAB — GLUCOSE, CAPILLARY
GLUCOSE-CAPILLARY: 63 mg/dL — AB (ref 65–99)
GLUCOSE-CAPILLARY: 195 mg/dL — AB (ref 65–99)
Glucose-Capillary: 173 mg/dL — ABNORMAL HIGH (ref 65–99)
Glucose-Capillary: 126 mg/dL — ABNORMAL HIGH (ref 65–99)

## 2016-09-04 LAB — ALBUMIN: Albumin: 2.2 g/dL — ABNORMAL LOW (ref 3.5–5.0)

## 2016-09-04 LAB — PROCALCITONIN: PROCALCITONIN: 1.96 ng/mL

## 2016-09-04 MED ORDER — ZOLPIDEM TARTRATE 5 MG PO TABS
5.0000 mg | ORAL_TABLET | Freq: Once | ORAL | Status: AC
  Administered 2016-09-04: 5 mg via ORAL
  Filled 2016-09-04: qty 1

## 2016-09-04 MED ORDER — LORAZEPAM 1 MG PO TABS: 1.0000 mg | ORAL_TABLET | Freq: Once | ORAL | Status: DC

## 2016-09-04 NOTE — Progress Notes (Signed)
Progress Note   Subjective  Chief Complaint: Upper GI Bleed  This morning, the patient is found laying in bed. She has a long discussion with me about the fact that she wishes to no longer have any chemo tx as they are causing her "other" problems and she is discussing with her daughter that she would like to be a DNR.  She had a large melenic stool yesterday and another "small dark flaky" stool per nursing this morning. Hgb has improved 7.3-->7.5. Pt does complain today of feeling weak and tired and tells me that when she coughs her upper abdomen hurts.   Objective   Vital signs in last 24 hours: Temp:  [97.8 F (36.6 C)-98.4 F (36.9 C)] 98.4 F (36.9 C) (05/03 0333) Pulse Rate:  [36-138] 83 (05/03 0800) Resp:  [16-31] 22 (05/03 0800) BP: (101-150)/(24-83) 142/41 (05/03 0800) SpO2:  [91 %-100 %] 100 % (05/03 0800) Weight:  [147 lb 7.8 oz (66.9 kg)-193 lb 2 oz (87.6 kg)] 193 lb 2 oz (87.6 kg) (05/03 0448) Last BM Date: 09/04/16 General:  Ill appearing African American female in NAD Heart:  Regular rate and rhythm; no murmurs Lungs: Respirations even and unlabored, lungs CTA bilaterally Abdomen:  Soft, mild epigastric ttp and nondistended. Normal bowel sounds. Extremities:  Without edema. Neurologic:  Alert and oriented,  grossly normal neurologically. Psych:  Cooperative. Normal mood and affect.  Intake/Output from previous day: 05/02 0701 - 05/03 0700 In: 2650 [I.V.:2350; IV Piggyback:300] Out: 150 [Urine:150] Intake/Output this shift: Total I/O In: 100 [I.V.:100] Out: -   Lab Results:  Recent Labs  09/03/16 0330 09/03/16 1435 09/03/16 1847 09/04/16 0416  WBC 24.3* 18.8*  --  19.1*  HGB 8.5* 7.3*  7.2* 7.3* 7.5*  HCT 24.8* 21.5* 21.4* 22.0*  PLT 79* 64*  --  70*   BMET  Recent Labs  09/01/16 1308 09/03/16 0330 09/04/16 0416  NA 130* 140 146*  K 4.2 4.4 3.9  CL 103 116* 125*  CO2 17* 16* 15*  GLUCOSE 326* 197* 79  BUN 68* 68* 48*  CREATININE 2.30*  2.60* 2.38*  CALCIUM 8.3* 7.3* 7.4*   LFT  Recent Labs  09/01/16 1308 09/04/16 0416  PROT 6.0*  --   ALBUMIN 2.9* 2.2*  AST 15  --   ALT 11*  --   ALKPHOS 105  --   BILITOT 0.2*  --    PT/INR  Recent Labs  09/02/16 1652  LABPROT 17.1*  INR 1.38    Studies/Results: Dg Abd 2 Views  Result Date: 09/03/2016 CLINICAL DATA:  Acute bleeding gastric ulcer. EXAM: ABDOMEN - 2 VIEW COMPARISON:  None. FINDINGS: No free air is identified. Three biopsy clips are seen in the right upper quadrant projecting over the expected location of the distal ascending and proximal transverse colon and one in the right lower quad. Scattered air containing small and large bowel loops without obstruction are noted. No pneumothorax. Bone-on-bone apposition of the left hip with subchondral sclerosis and degenerative subchondral cystic change is identified. Aortoiliac atherosclerosis is noted. Injection granulomas project over the right gluteal region. No radio-opaque calculi or other significant radiographic abnormality is seen. IMPRESSION: 1. No free air identified. Scattered air containing small large bowel loops without obstruction. 2. Severe osteoarthritic joint space narrowing of the left hip with bone-on-bone apposition. 3. Three biopsy clips are seen in the right upper quadrant of the abdomen and one in the right lower quadrant. Electronically Signed   By: Shanon Brow  Randel Pigg M.D.   On: 09/03/2016 19:31    Assessment / Plan:   Assessment: 1. Upper GI Bleed:pt with two more melenic stools yesterday, likely old blood? as pt hgb has actually slighlty improved overnight 7.3-->7.5 2. Acute Blood Loss Anemia: as above 3. Stage III non-small cell lung cancer: pt has decided she wishes to stop chemo 4. CKD Stage IV 5. Sepsis secondary to lobar pneumonia  Plan: 1. Continue hgb q8h with transfusion as needed <7 2. Continue Pantoprazole drip for total of 72 hours then BID Pantoprazole 40 3. Hold Plavix and ASA 4. IR  consult if brisk rebleeding 5. Continue clear liquid diet 6. Please await final recommendations from Dr. Hilarie Fredrickson  Thank you for your kind consultation, we will continue to follow.   LOS: 3 days   Levin Erp  09/04/2016, 9:57 AM  Pager # [phone]

## 2016-09-04 NOTE — Progress Notes (Addendum)
PROGRESS NOTE [facility]   Erica Jimenez   ZOX:[id number] DOB: 12/20/1929  DOA: 09/01/2016 PCP: [name] (Inactive)   Brief Narrative:  83 year old female with medical history significant for lung cancer, diabetes mellitus, hypertension, chronic systolic heart failure who presented to the ED with ongoing cough and shortness of breath with exertion. She was admitted for suspected sepsis secondary to possible HCAP, and was started on broad-spectrum IV antibiotic. On the night of 4/30-5/1, she report to bloody bowel movement and coffee-ground emesis GI was consulted and emergent EGD was done which found esophageal candidiasis and bleeding ulcer which were hemostasis with epinephrine and clips. Patient also was given to 2 units of PRBC's. Patient treated with Protonix ggt for 72 hrs.   Subjective: Patient seen and examined, feeling better. No acute events overnight. Patient denies chest pain, SOB and dysuria. Had bowel movement this AM with dark brown stool - prob old blood. Patient also report that she is going to discuss with her daughter her code status as she wishes to be DNR, although she would like to talk to her daughter before making a final decision. For now patient remains full code.   Assessment & Plan: Sepsis secondary to HCAP - sepsis physiology has resolved Checks x-ray shows some right anterior lymphadenopathy and multifocal masses which is improving from last study, although patient with cough and general malaise and procalcitonin of 1.08 Patient initially started on vancomycin and Zosyn. Vancomycin discontinued on 09/02/2016 - MRSA negative Procalcitonin elevated although not reliable on CKD patients, will continue Zosyn for now as WBC is still up. Patient continues to be afebrile, no other signs of infections noted  Blood cultures thus far NGTD  Continue supportive treatment   Acute blood loss anemia secondary to PUD in setting of ASA and Plavix  Hgb continues to  be stable  Status post EGD with hemostasis Status post 2 units of PRBCs FOBT was positive Holding aspirin and Plavix Continue Protonix ggt for total of 72 hrs then switch to BID  Advance diet as tolerated  Check CBC q 12   Esophageal candidiasis Continue current regimen - Diflucan   Acute on chronic kidney disease stage IV Baseline creatinine is 2.3, today is 2.6 likely secondary to blood loss from hyperperfusion. Now back to baseline  Can d/c IV fluids  Continue to hold Lasix  Will check Cr in AM   Paroxymal Afib - per patient she has had this in the past,  HR is well controlled  EKG ordered and did independent review, shows Afib with RVR, and PVCs CHA2DS2VASc > 5, patient needs a/c but with recent bleed will hold on that. She can discuss further therapy as outpatient. Continue to monitor in tele.  TSH normal   Essential hypertension - continues to be on the lowed end  Continue to monitor   Coronary artery disease, history of CVA Holding aspirin and Plavix Asymptomatic Continue to monitor  Chronic systolic CHF  Seems to be compensated Continue home medication, except Lasix which is on hold due to increasing creatinine Monitor daily weight  Diabetes mellitus type 2 Continue Lantus and SSI Monitor CBGs  Stage III non-small cell lung cancer  Diagnosis in March 2018 - patient reports to me that she does not wants to continue chemotherapy, will let Dr. [name] know  Follow-up with Dr. [name] as an outpatient.  DVT prophylaxis: SCD's  Code Status: FULL  Family Communication: None at bedside  Disposition Plan: Home when medically stable   Consultants:  GI   Procedures:   EGD done on May 1 of 2018  Antimicrobials: Anti-infectives    Start     Dose/Rate Route Frequency Ordered Stop   09/04/16 1000  fluconazole (DIFLUCAN) IVPB 100 mg     100 mg 50 mL/hr over 60 Minutes Intravenous Every 24 hours 09/03/16 0844     09/03/16 0900  fluconazole (DIFLUCAN) IVPB 200  mg     200 mg 100 mL/hr over 60 Minutes Intravenous  Once 09/03/16 0842 09/03/16 1038   09/01/16 1515  piperacillin-tazobactam (ZOSYN) IVPB 3.375 g  Status:  Discontinued     3.375 g 100 mL/hr over 30 Minutes Intravenous  Once 09/01/16 1502 09/01/16 1504   09/01/16 1515  vancomycin (VANCOCIN) IVPB 1000 mg/200 mL premix  Status:  Discontinued     1,000 mg 200 mL/hr over 60 Minutes Intravenous  Once 09/01/16 1502 09/01/16 1505   09/01/16 1415  piperacillin-tazobactam (ZOSYN) IVPB 2.25 g     2.25 g 100 mL/hr over 30 Minutes Intravenous Every 6 hours 09/01/16 1400     09/01/16 1400  piperacillin-tazobactam (ZOSYN) IVPB 3.375 g  Status:  Discontinued     3.375 g 100 mL/hr over 30 Minutes Intravenous  Once 09/01/16 1355 09/01/16 1400   09/01/16 1400  vancomycin (VANCOCIN) IVPB 1000 mg/200 mL premix     1,000 mg 200 mL/hr over 60 Minutes Intravenous  Once 09/01/16 1355 09/01/16 1516       Objective: Vitals:   09/04/16 0448 09/04/16 0500 09/04/16 0600 09/04/16 0700  BP:  (!) 136/29 (!) 134/34 (!) 129/29  Pulse:  (!) 54 (!) 56 (!) 39  Resp:  20 19 (!) 23  Temp:      TempSrc:      SpO2:  92% 92% 91%  Weight: 87.6 kg (193 lb 2 oz)     Height:        Intake/Output Summary (Last 24 hours) at 09/04/16 0806 Last data filed at 09/04/16 0400  Gross per 24 hour  Intake             2300 ml  Output              150 ml  Net             2150 ml   Filed Weights   09/03/16 1500 09/03/16 1900 09/04/16 0448  Weight: 66.9 kg (147 lb 7.8 oz) 87.2 kg (192 lb 3.9 oz) 87.6 kg (193 lb 2 oz)    Examination:  General exam: NAD  Respiratory system: CTA  Cardiovascular system: S1S2 no murmurs, no JVD Gastrointestinal system: Abdomen soft NTND, + BS  Central nervous system: AAOx3  Extremities: No LE edema  Skin: No lesions  Psychiatry: Affect and mood appropriate     Data Reviewed: I have personally reviewed following labs and imaging studies  CBC:  Recent Labs Lab 09/01/16 1308   09/02/16 1652 09/02/16 2151 09/03/16 0330 09/03/16 1435 09/03/16 1847 09/04/16 0416  WBC 27.0*  < > 29.8* 32.7* 24.3* 18.8*  --  19.1*  NEUTROABS 21.3*  --   --   --   --   --   --   --   HGB 10.3*  < > 9.4* 9.4* 8.5* 7.3*  7.2* 7.3* 7.5*  HCT 31.8*  < > 27.9* 27.1* 24.8* 21.5* 21.4* 22.0*  MCV 90.1  < > 90.3 88.3 87.9 90.0  --  89.4  PLT 152  < > 82* 80* 79* 64*  --  70*  < > = values in this interval not displayed. Basic Metabolic Panel:  Recent Labs Lab 09/01/16 1308 09/03/16 0330 09/04/16 0416  NA 130* 140 146*  K 4.2 4.4 3.9  CL 103 116* 125*  CO2 17* 16* 15*  GLUCOSE 326* 197* 79  BUN 68* 68* 48*  CREATININE 2.30* 2.60* 2.38*  CALCIUM 8.3* 7.3* 7.4*   GFR: Estimated Creatinine Clearance: 19.6 mL/min (A) (by C-G formula based on SCr of 2.38 mg/dL (H)). Liver Function Tests:  Recent Labs Lab 09/01/16 1308 09/04/16 0416  AST 15  --   ALT 11*  --   ALKPHOS 105  --   BILITOT 0.2*  --   PROT 6.0*  --   ALBUMIN 2.9* 2.2*    Recent Labs Lab 09/01/16 1308  LIPASE 22   No results for input(s): AMMONIA in the last 168 hours. Coagulation Profile:  Recent Labs Lab 09/02/16 1652  INR 1.38   Cardiac Enzymes: No results for input(s): CKTOTAL, CKMB, CKMBINDEX, TROPONINI in the last 168 hours. BNP (last 3 results) No results for input(s): PROBNP in the last 8760 hours. HbA1C:  Recent Labs  09/01/16 1825  HGBA1C 9.0*   CBG:  Recent Labs Lab 09/03/16 0755 09/03/16 1149 09/03/16 1520 09/03/16 2122 09/04/16 0737  GLUCAP 163* 126* 101* 106* 63*   Lipid Profile: No results for input(s): CHOL, HDL, LDLCALC, TRIG, CHOLHDL, LDLDIRECT in the last 72 hours. Thyroid Function Tests:  Recent Labs  09/04/16 0416  TSH 3.677   Anemia Panel: No results for input(s): VITAMINB12, FOLATE, FERRITIN, TIBC, IRON, RETICCTPCT in the last 72 hours. Sepsis Labs:  Recent Labs Lab 09/01/16 1825 09/02/16 1652 09/02/16 2151 09/03/16 0330 09/04/16 0416   PROCALCITON 1.08  --   --   --  1.96  LATICACIDVEN 2.3* 3.5* 2.8* 1.2  --     Recent Results (from the past 240 hour(s))  Blood Culture (routine x 2)     Status: Abnormal   Collection Time: 09/01/16  1:08 PM  Result Value Ref Range Status   Specimen Description BLOOD LEFT WRIST  Final   Special Requests   Final    BOTTLES DRAWN AEROBIC ONLY Blood Culture adequate volume   Culture  Setup Time   Final    GRAM POSITIVE COCCI IN PAIRS AEROBIC BOTTLE ONLY CRITICAL RESULT CALLED TO, READ BACK BY AND VERIFIED WITH: N GLOGOVAC PHARMD 2153 09/02/16 A BROWNING    Culture (A)  Final    MICROCOCCUS LUTEUS/LYLAE THE SIGNIFICANCE OF ISOLATING THIS ORGANISM FROM A SINGLE SET OF BLOOD CULTURES WHEN MULTIPLE SETS ARE DRAWN IS UNCERTAIN. PLEASE NOTIFY THE MICROBIOLOGY DEPARTMENT WITHIN ONE WEEK IF SPECIATION AND SENSITIVITIES ARE REQUIRED. Performed at [facility], [place] [address], [place], [place] [address]    Report Status 09/03/2016 FINAL  Final  Blood Culture ID Panel (Reflexed)     Status: None   Collection Time: 09/01/16  1:08 PM  Result Value Ref Range Status   Enterococcus species NOT DETECTED NOT DETECTED Final   Listeria monocytogenes NOT DETECTED NOT DETECTED Final   Staphylococcus species NOT DETECTED NOT DETECTED Final   Staphylococcus aureus NOT DETECTED NOT DETECTED Final   Streptococcus species NOT DETECTED NOT DETECTED Final   Streptococcus agalactiae NOT DETECTED NOT DETECTED Final   Streptococcus pneumoniae NOT DETECTED NOT DETECTED Final   Streptococcus pyogenes NOT DETECTED NOT DETECTED Final   Acinetobacter baumannii NOT DETECTED NOT DETECTED Final   Enterobacteriaceae species NOT DETECTED NOT DETECTED Final  Enterobacter cloacae complex NOT DETECTED NOT DETECTED Final   Escherichia coli NOT DETECTED NOT DETECTED Final   Klebsiella oxytoca NOT DETECTED NOT DETECTED Final   Klebsiella pneumoniae NOT DETECTED NOT DETECTED Final   Proteus species NOT DETECTED NOT  DETECTED Final   Serratia marcescens NOT DETECTED NOT DETECTED Final   Haemophilus influenzae NOT DETECTED NOT DETECTED Final   Neisseria meningitidis NOT DETECTED NOT DETECTED Final   Pseudomonas aeruginosa NOT DETECTED NOT DETECTED Final   Candida albicans NOT DETECTED NOT DETECTED Final   Candida glabrata NOT DETECTED NOT DETECTED Final   Candida krusei NOT DETECTED NOT DETECTED Final   Candida parapsilosis NOT DETECTED NOT DETECTED Final   Candida tropicalis NOT DETECTED NOT DETECTED Final    Comment: Performed at [facility], [place] [address], [place], [place] [address]  Blood Culture (routine x 2)     Status: None (Preliminary result)   Collection Time: 09/01/16  2:28 PM  Result Value Ref Range Status   Specimen Description BLOOD RIGHT HAND  Final   Special Requests   Final    BOTTLES DRAWN AEROBIC AND ANAEROBIC Blood Culture adequate volume   Culture   Final    NO GROWTH 2 DAYS Performed at [facility], [place] [address] [place], [place] [address]    Report Status PENDING  Incomplete  MRSA PCR Screening     Status: None   Collection Time: 09/02/16  9:51 AM  Result Value Ref Range Status   MRSA by PCR NEGATIVE NEGATIVE Final    Comment:        The GeneXpert MRSA Assay (FDA approved for NASAL specimens only), is one component of a comprehensive MRSA colonization surveillance program. It is not intended to diagnose MRSA infection nor to guide or monitor treatment for MRSA infections.          Radiology Studies: Dg Abd 2 Views  Result Date: 09/03/2016 CLINICAL DATA:  Acute bleeding gastric ulcer. EXAM: ABDOMEN - 2 VIEW COMPARISON:  None. FINDINGS: No free air is identified. Three biopsy clips are seen in the right upper quadrant projecting over the expected location of the distal ascending and proximal transverse colon and one in the right lower quad. Scattered air containing small and large bowel loops without obstruction are noted. No pneumothorax.  Bone-on-bone apposition of the left hip with subchondral sclerosis and degenerative subchondral cystic change is identified. Aortoiliac atherosclerosis is noted. Injection granulomas project over the right gluteal region. No radio-opaque calculi or other significant radiographic abnormality is seen. IMPRESSION: 1. No free air identified. Scattered air containing small large bowel loops without obstruction. 2. Severe osteoarthritic joint space narrowing of the left hip with bone-on-bone apposition. 3. Three biopsy clips are seen in the right upper quadrant of the abdomen and one in the right lower quadrant. Electronically Signed   By: Ashley Royalty M.D.   On: 09/03/2016 19:31      Scheduled Meds: . atorvastatin  80 mg Oral QPC supper  . bisoprolol  2.5 mg Oral QHS  . brinzolamide  1 drop Both Eyes BID  . calcitRIOL  0.25 mcg Oral Q M,W,F  . citalopram  20 mg Oral QPC supper  . febuxostat  40 mg Oral Daily  . feeding supplement  1 Container Oral TID BM  . feeding supplement (ENSURE ENLIVE)  237 mL Oral BID BM  . ferrous gluconate  324 mg Oral QPC supper  . folic acid  1 mg Oral Daily  .  hydrALAZINE  50 mg Oral BID  . insulin aspart  0-9 Units Subcutaneous TID WC  . insulin glargine  12 Units Subcutaneous QAC breakfast  . isosorbide mononitrate  60 mg Oral QPC supper  . latanoprost  1 drop Both Eyes QHS  . levothyroxine  50 mcg Oral QPC supper  . mirtazapine  15 mg Oral QHS   Continuous Infusions: . sodium chloride 75 mL/hr at 09/03/16 2128  . fluconazole (DIFLUCAN) IV    . pantoprozole (PROTONIX) infusion 8 mg/hr (09/03/16 1800)  . piperacillin-tazobactam (ZOSYN)  IV Stopped (09/04/16 0342)     LOS: 3 days    Time spent: Total of 25 minutes spent with pt, greater than 50% of which was spent in discussion of  treatment, counseling and coordination of care    Chipper Oman, MD Pager: Text Page via [email or web address]  [phone]  If 7PM-7AM, please contact  night-coverage [email or web address] Password [email or web address] 09/04/2016, 8:06 AM

## 2016-09-04 NOTE — Progress Notes (Signed)
Peripherally Inserted Central Catheter/Midline Placement  The IV Nurse has discussed with the patient and/or persons authorized to consent for the patient, the purpose of this procedure and the potential benefits and risks involved with this procedure.  The benefits include less needle sticks, lab draws from the catheter, and the patient may be discharged home with the catheter. Risks include, but not limited to, infection, bleeding, blood clot (thrombus formation), and puncture of an artery; nerve damage and irregular heartbeat and possibility to perform a PICC exchange if needed/ordered by physician.  Alternatives to this procedure were also discussed.  B[employer] Power PICC patient education guide, fact sheet on infection prevention and patient information card has been provided to patient /or left at bedside.    PICC/Midline Placement Documentation        Dashiel Bergquist, Nicolette Bang 09/04/2016, 8:44 PM

## 2016-09-04 NOTE — Progress Notes (Signed)
IV team unable to get IV with ultrasound, Dr Lorrene Reid called and gave  permission for PICC line.

## 2016-09-04 NOTE — Progress Notes (Signed)
PT Cancellation Note  Patient Details Name: FLORAINE BUECHLER MRN: [id number] DOB: 1930-03-05   Cancelled Treatment:     PT deferred this am at request of RN based on pt's weakening condition.  Will follow.   Aaliyah Gavel 09/04/2016, 11:57 AM

## 2016-09-05 DIAGNOSIS — K250 Acute gastric ulcer with hemorrhage: Secondary | ICD-10-CM

## 2016-09-05 DIAGNOSIS — I480 Paroxysmal atrial fibrillation: Secondary | ICD-10-CM

## 2016-09-05 DIAGNOSIS — R0602 Shortness of breath: Secondary | ICD-10-CM

## 2016-09-05 DIAGNOSIS — Z7189 Other specified counseling: Secondary | ICD-10-CM

## 2016-09-05 DIAGNOSIS — B3781 Candidal esophagitis: Secondary | ICD-10-CM

## 2016-09-05 DIAGNOSIS — Z515 Encounter for palliative care: Secondary | ICD-10-CM

## 2016-09-05 LAB — BASIC METABOLIC PANEL
ANION GAP: 6 (ref 5–15)
BUN: 37 mg/dL — ABNORMAL HIGH (ref 6–20)
CHLORIDE: 124 mmol/L — AB (ref 101–111)
CO2: 16 mmol/L — ABNORMAL LOW (ref 22–32)
CREATININE: 2.37 mg/dL — AB (ref 0.44–1.00)
Calcium: 7.8 mg/dL — ABNORMAL LOW (ref 8.9–10.3)
GFR calc non Af Amer: 18 mL/min — ABNORMAL LOW (ref >60)
GFR, EST AFRICAN AMERICAN: 21 mL/min — AB (ref >60)
Glucose, Bld: 140 mg/dL — ABNORMAL HIGH (ref 65–99)
POTASSIUM: 4.1 mmol/L (ref 3.5–5.1)
SODIUM: 146 mmol/L — AB (ref 135–145)

## 2016-09-05 LAB — GLUCOSE, CAPILLARY
GLUCOSE-CAPILLARY: 144 mg/dL — AB (ref 65–99)
GLUCOSE-CAPILLARY: 190 mg/dL — AB (ref 65–99)
Glucose-Capillary: 153 mg/dL — ABNORMAL HIGH (ref 65–99)
Glucose-Capillary: 101 mg/dL — ABNORMAL HIGH (ref 65–99)

## 2016-09-05 LAB — CBC
HCT: 21.2 % — ABNORMAL LOW (ref 36.0–46.0)
Hemoglobin: 7.1 g/dL — ABNORMAL LOW (ref 12.0–15.0)
MCH: 30.5 pg (ref 26.0–34.0)
MCHC: 33.5 g/dL (ref 30.0–36.0)
MCV: 91 fL (ref 78.0–100.0)
Platelets: 65 10*3/uL — ABNORMAL LOW (ref 150–400)
RBC: 2.33 MIL/uL — ABNORMAL LOW (ref 3.87–5.11)
RDW: 17.3 % — ABNORMAL HIGH (ref 11.5–15.5)
WBC: 15.8 10*3/uL — ABNORMAL HIGH (ref 4.0–10.5)

## 2016-09-05 LAB — HEMOGLOBIN AND HEMATOCRIT, BLOOD
HCT: 26.8 % — ABNORMAL LOW (ref 36.0–46.0)
Hemoglobin: 8.9 g/dL — ABNORMAL LOW (ref 12.0–15.0)

## 2016-09-05 MED ORDER — SODIUM CHLORIDE 0.9 % IV SOLN
Freq: Once | INTRAVENOUS | Status: AC
  Administered 2016-09-05: 09:00:00 via INTRAVENOUS

## 2016-09-05 MED ORDER — UNJURY CHICKEN SOUP POWDER
8.0000 [oz_av] | Freq: Two times a day (BID) | ORAL | Status: DC
  Administered 2016-09-06: 8 [oz_av] via ORAL
  Filled 2016-09-05 (×7): qty 27

## 2016-09-05 MED ORDER — SODIUM CHLORIDE 0.9 % IV SOLN
8.0000 mg/h | INTRAVENOUS | Status: DC
  Administered 2016-09-05 – 2016-09-06 (×2): 8 mg/h via INTRAVENOUS
  Filled 2016-09-05 (×3): qty 80

## 2016-09-05 MED ORDER — ADULT MULTIVITAMIN W/MINERALS CH
1.0000 | ORAL_TABLET | Freq: Every day | ORAL | Status: DC
  Administered 2016-09-05 – 2016-09-08 (×4): 1 via ORAL
  Filled 2016-09-05 (×4): qty 1

## 2016-09-05 MED ORDER — FLUCONAZOLE 100 MG PO TABS
100.0000 mg | ORAL_TABLET | Freq: Every day | ORAL | Status: DC
  Administered 2016-09-06 – 2016-09-08 (×3): 100 mg via ORAL
  Filled 2016-09-05 (×3): qty 1

## 2016-09-05 MED ORDER — ZOLPIDEM TARTRATE 5 MG PO TABS
5.0000 mg | ORAL_TABLET | Freq: Every evening | ORAL | Status: DC | PRN
  Administered 2016-09-05 – 2016-09-07 (×3): 5 mg via ORAL
  Filled 2016-09-05 (×3): qty 1

## 2016-09-05 MED ORDER — PANTOPRAZOLE SODIUM 40 MG IV SOLR
8.0000 mg/h | INTRAVENOUS | Status: DC
  Administered 2016-09-05: 8 mg/h via INTRAVENOUS
  Filled 2016-09-05 (×4): qty 80

## 2016-09-05 MED ORDER — HYDROCODONE-HOMATROPINE 5-1.5 MG/5ML PO SYRP
2.5000 mL | ORAL_SOLUTION | ORAL | Status: DC | PRN
  Administered 2016-09-05 – 2016-09-08 (×3): 2.5 mL via ORAL
  Filled 2016-09-05 (×3): qty 5

## 2016-09-05 MED ORDER — MIRTAZAPINE 15 MG PO TABS
30.0000 mg | ORAL_TABLET | Freq: Every day | ORAL | Status: DC
  Administered 2016-09-05 – 2016-09-07 (×3): 30 mg via ORAL
  Filled 2016-09-05 (×3): qty 2

## 2016-09-05 NOTE — Progress Notes (Signed)
PROGRESS NOTE [facility]   Erica Jimenez   ZOX:[id number] DOB: June 26, 1929  DOA: 09/01/2016 PCP: [name] (Inactive)   Brief Narrative:  83 year old female with medical history significant for lung cancer, diabetes mellitus, hypertension, chronic systolic heart failure who presented to the ED with ongoing cough and shortness of breath with exertion. She was admitted for suspected sepsis secondary to possible HCAP, and was started on broad-spectrum IV antibiotic. On the night of 4/30-5/1, she report to bloody bowel movement and coffee-ground emesis GI was consulted and emergent EGD was done which found esophageal candidiasis and bleeding ulcer which were hemostasis with epinephrine and clips. Patient also was given to 2 units of PRBC's. Patient treated with Protonix ggt for 72 hrs.   Subjective: Patient feeling much better today, she had a BM today, per nursing staff brown, well formed. Patient continues to c/o abdominal tenderness with cough. Afebrile, remains with poor appetite. No acute events overnight.   Assessment & Plan: Sepsis secondary to HCAP - sepsis physiology has resolved Checks x-ray shows right anterior lymphadenopathy and multifocal masses which is improving from last study, although patient with cough and general malaise and procalcitonin of 1.08 Patient initially started on vancomycin and Zosyn. Vancomycin discontinued on 09/02/2016 - MRSA negative Procalcitonin elevated although not reliable on CKD patients Patient continues on Zosyn day 5 if WBC continues to improves and remains afebrile will change to oral abx, likely Augmentin  Blood cultures thus far NGTD  Will add Hycodan for cough   Acute blood loss anemia secondary to PUD in setting of ASA and Plavix  Hgb drop from 7.8 -> 7.1 will transfuse another unit. She already has receive total of 2 units Decrease in Hgb could be due to slow re-bleed.  Status post EGD with hemostasis - patient may benefit from  IR - GI arranging management, appreciate help on this matter  FOBT was positive Holding aspirin and Plavix Continue current regimen: Protonix ggt for total of 72 hrs then switch to BID  Continue clears liquids, pt with poor appetite Remeron increased  Check CBC daily, as no more melena or bloody stools are noted   Esophageal candidiasis Continue current regimen x 7 days- Diflucan   Acute on chronic kidney disease stage IV - Improved Cr back to baseline Baseline creatinine is 2.3.  Continue to hold Lasix  Will check Cr in AM   Afib - per patient she has had this in the past,  HR is well controlled  CHA2DS2VASc > 5, patient needs a/c but with recent bleed will hold on that. She can discuss further therapy as outpatient. On BB will continue to monitor   Essential hypertension - continues to be soft  Continue current regimen   Coronary artery disease, history of CVA Holding aspirin and Plavix Asymptomatic Continue to monitor  Chronic systolic CHF  Seems to be compensated Continue current regimen: home medication, except Lasix which is on hold due to increasing creatinine Monitor daily weight  Diabetes mellitus type 2 - CBG's stable  Continue current regimen  Lantus and SSI Monitor CBGs  Stage III non-small cell lung cancer  Diagnosis in March 2018 - patient reports to me that she does not wants to continue chemotherapy Case discussed with Dr. [name], he will discuss prognosis and further plan as outpatient.   Goals of care  Discussed with daughter and patient her goals of care. Patient informed me that she does not wish to have more chemotherapy. She would like to receive treatment  for her bleeding including embolization if needed. Also patient is requesting to be DNR. Daughter reports that she does not agree with her mother but she respect her wishes and understands her decision. Also patient with poor appetite will increase Remeron. Will change status to DNR and will order  palliative care consult   DVT prophylaxis: SCD's  Code Status: FULL  Family Communication: None at bedside  Disposition Plan: Keep in SDU as high risk for re-bleeding  Consultants:   GI   Procedures:   EGD done on May 1 of 2018  Antimicrobials: Anti-infectives    Start     Dose/Rate Route Frequency Ordered Stop   09/04/16 1000  fluconazole (DIFLUCAN) IVPB 100 mg     100 mg 50 mL/hr over 60 Minutes Intravenous Every 24 hours 09/03/16 0844     09/03/16 0900  fluconazole (DIFLUCAN) IVPB 200 mg     200 mg 100 mL/hr over 60 Minutes Intravenous  Once 09/03/16 0842 09/03/16 1038   09/01/16 1515  piperacillin-tazobactam (ZOSYN) IVPB 3.375 g  Status:  Discontinued     3.375 g 100 mL/hr over 30 Minutes Intravenous  Once 09/01/16 1502 09/01/16 1504   09/01/16 1515  vancomycin (VANCOCIN) IVPB 1000 mg/200 mL premix  Status:  Discontinued     1,000 mg 200 mL/hr over 60 Minutes Intravenous  Once 09/01/16 1502 09/01/16 1505   09/01/16 1415  piperacillin-tazobactam (ZOSYN) IVPB 2.25 g     2.25 g 100 mL/hr over 30 Minutes Intravenous Every 6 hours 09/01/16 1400     09/01/16 1400  piperacillin-tazobactam (ZOSYN) IVPB 3.375 g  Status:  Discontinued     3.375 g 100 mL/hr over 30 Minutes Intravenous  Once 09/01/16 1355 09/01/16 1400   09/01/16 1400  vancomycin (VANCOCIN) IVPB 1000 mg/200 mL premix     1,000 mg 200 mL/hr over 60 Minutes Intravenous  Once 09/01/16 1355 09/01/16 1516       Objective: Vitals:   09/05/16 0500 09/05/16 0600 09/05/16 0700 09/05/16 0800  BP:  (!) 147/34  (!) 165/38  Pulse: (!) 54 (!) 54 (!) 39 (!) 59  Resp: (!) '24 19 18 20  '$ Temp:    99 F (37.2 C)  TempSrc:    Oral  SpO2: 92% 92% 93% 90%  Weight:      Height:        Intake/Output Summary (Last 24 hours) at 09/05/16 4174 Last data filed at 09/05/16 0800  Gross per 24 hour  Intake             1030 ml  Output              200 ml  Net              830 ml   Filed Weights   09/04/16 0448 09/05/16 0411  09/05/16 0424  Weight: 87.6 kg (193 lb 2 oz) 68.1 kg (150 lb 2.1 oz) 67.8 kg (149 lb 7.6 oz)    Examination:  General exam: Calm  Respiratory system: Clear to auscultation   Cardiovascular system: S1S2 no murmur  Gastrointestinal system: Abd soft NTND no masses palpated  Central nervous system: Alert and oriented x 3  Extremities: No peripheral edema  Skin: No rashes  Psychiatry: Mood appropriate. Judgement intact   Data Reviewed: I have personally reviewed following labs and imaging studies  CBC:  Recent Labs Lab 09/01/16 1308  09/03/16 0330 09/03/16 1435 09/03/16 1847 09/04/16 0416 09/04/16 1758 09/05/16 0422  WBC 27.0*  < >  24.3* 18.8*  --  19.1* 19.8* 15.8*  NEUTROABS 21.3*  --   --   --   --   --   --   --   HGB 10.3*  < > 8.5* 7.3*  7.2* 7.3* 7.5* 7.8* 7.1*  HCT 31.8*  < > 24.8* 21.5* 21.4* 22.0* 23.1* 21.2*  MCV 90.1  < > 87.9 90.0  --  89.4 90.9 91.0  PLT 152  < > 79* 64*  --  70* 75* 65*  < > = values in this interval not displayed. Basic Metabolic Panel:  Recent Labs Lab 09/01/16 1308 09/03/16 0330 09/04/16 0416 09/05/16 0422  NA 130* 140 146* 146*  K 4.2 4.4 3.9 4.1  CL 103 116* 125* 124*  CO2 17* 16* 15* 16*  GLUCOSE 326* 197* 79 140*  BUN 68* 68* 48* 37*  CREATININE 2.30* 2.60* 2.38* 2.37*  CALCIUM 8.3* 7.3* 7.4* 7.8*   GFR: Estimated Creatinine Clearance: 16.5 mL/min (A) (by C-G formula based on SCr of 2.37 mg/dL (H)). Liver Function Tests:  Recent Labs Lab 09/01/16 1308 09/04/16 0416  AST 15  --   ALT 11*  --   ALKPHOS 105  --   BILITOT 0.2*  --   PROT 6.0*  --   ALBUMIN 2.9* 2.2*    Recent Labs Lab 09/01/16 1308  LIPASE 22   No results for input(s): AMMONIA in the last 168 hours. Coagulation Profile:  Recent Labs Lab 09/02/16 1652  INR 1.38   CBG:  Recent Labs Lab 09/04/16 0737 09/04/16 1238 09/04/16 1614 09/04/16 2121 09/05/16 0813  GLUCAP 63* 195* 173* 126* 153*   Lipid Profile: No results for input(s):  CHOL, HDL, LDLCALC, TRIG, CHOLHDL, LDLDIRECT in the last 72 hours. Thyroid Function Tests:  Recent Labs  09/04/16 0416  TSH 3.677   Anemia Panel: No results for input(s): VITAMINB12, FOLATE, FERRITIN, TIBC, IRON, RETICCTPCT in the last 72 hours. Sepsis Labs:  Recent Labs Lab 09/01/16 1825 09/02/16 1652 09/02/16 2151 09/03/16 0330 09/04/16 0416  PROCALCITON 1.08  --   --   --  1.96  LATICACIDVEN 2.3* 3.5* 2.8* 1.2  --     Recent Results (from the past 240 hour(s))  Blood Culture (routine x 2)     Status: Abnormal   Collection Time: 09/01/16  1:08 PM  Result Value Ref Range Status   Specimen Description BLOOD LEFT WRIST  Final   Special Requests   Final    BOTTLES DRAWN AEROBIC ONLY Blood Culture adequate volume   Culture  Setup Time   Final    GRAM POSITIVE COCCI IN PAIRS AEROBIC BOTTLE ONLY CRITICAL RESULT CALLED TO, READ BACK BY AND VERIFIED WITH: N GLOGOVAC PHARMD 2153 09/02/16 A BROWNING    Culture (A)  Final    MICROCOCCUS LUTEUS/LYLAE THE SIGNIFICANCE OF ISOLATING THIS ORGANISM FROM A SINGLE SET OF BLOOD CULTURES WHEN MULTIPLE SETS ARE DRAWN IS UNCERTAIN. PLEASE NOTIFY THE MICROBIOLOGY DEPARTMENT WITHIN ONE WEEK IF SPECIATION AND SENSITIVITIES ARE REQUIRED. Performed at [facility], [place] [address] [place], [place] [address]    Report Status 09/03/2016 FINAL  Final  Blood Culture ID Panel (Reflexed)     Status: None   Collection Time: 09/01/16  1:08 PM  Result Value Ref Range Status   Enterococcus species NOT DETECTED NOT DETECTED Final   Listeria monocytogenes NOT DETECTED NOT DETECTED Final   Staphylococcus species NOT DETECTED NOT DETECTED Final   Staphylococcus aureus NOT DETECTED NOT DETECTED Final  Streptococcus species NOT DETECTED NOT DETECTED Final   Streptococcus agalactiae NOT DETECTED NOT DETECTED Final   Streptococcus pneumoniae NOT DETECTED NOT DETECTED Final   Streptococcus pyogenes NOT DETECTED NOT DETECTED Final   Acinetobacter baumannii  NOT DETECTED NOT DETECTED Final   Enterobacteriaceae species NOT DETECTED NOT DETECTED Final   Enterobacter cloacae complex NOT DETECTED NOT DETECTED Final   Escherichia coli NOT DETECTED NOT DETECTED Final   Klebsiella oxytoca NOT DETECTED NOT DETECTED Final   Klebsiella pneumoniae NOT DETECTED NOT DETECTED Final   Proteus species NOT DETECTED NOT DETECTED Final   Serratia marcescens NOT DETECTED NOT DETECTED Final   Haemophilus influenzae NOT DETECTED NOT DETECTED Final   Neisseria meningitidis NOT DETECTED NOT DETECTED Final   Pseudomonas aeruginosa NOT DETECTED NOT DETECTED Final   Candida albicans NOT DETECTED NOT DETECTED Final   Candida glabrata NOT DETECTED NOT DETECTED Final   Candida krusei NOT DETECTED NOT DETECTED Final   Candida parapsilosis NOT DETECTED NOT DETECTED Final   Candida tropicalis NOT DETECTED NOT DETECTED Final    Comment: Performed at [facility], [place] [address], [place], [place] [address]  Blood Culture (routine x 2)     Status: None (Preliminary result)   Collection Time: 09/01/16  2:28 PM  Result Value Ref Range Status   Specimen Description BLOOD RIGHT HAND  Final   Special Requests   Final    BOTTLES DRAWN AEROBIC AND ANAEROBIC Blood Culture adequate volume   Culture   Final    NO GROWTH 3 DAYS Performed at [facility], [place] [address] [place], [place] [address]    Report Status PENDING  Incomplete  MRSA PCR Screening     Status: None   Collection Time: 09/02/16  9:51 AM  Result Value Ref Range Status   MRSA by PCR NEGATIVE NEGATIVE Final    Comment:        The GeneXpert MRSA Assay (FDA approved for NASAL specimens only), is one component of a comprehensive MRSA colonization surveillance program. It is not intended to diagnose MRSA infection nor to guide or monitor treatment for MRSA infections.   Culture, blood (Routine X 2) w Reflex to ID Panel     Status: None (Preliminary result)   Collection Time: 09/03/16  2:35 PM    Result Value Ref Range Status   Specimen Description BLOOD LEFT ARM  Final   Special Requests IN PEDIATRIC BOTTLE Blood Culture adequate volume  Final   Culture   Final    NO GROWTH < 24 HOURS Performed at [facility], [place] [address] [place], [place] [address]    Report Status PENDING  Incomplete  Culture, blood (Routine X 2) w Reflex to ID Panel     Status: None (Preliminary result)   Collection Time: 09/03/16  2:38 PM  Result Value Ref Range Status   Specimen Description BLOOD LEFT ARM  Final   Special Requests IN PEDIATRIC BOTTLE Blood Culture adequate volume  Final   Culture   Final    NO GROWTH < 24 HOURS Performed at [facility], [place] [address], [place],  [address]    Report Status PENDING  Incomplete       Radiology Studies: Dg Chest Port 1 View  Result Date: 09/04/2016 CLINICAL DATA:  PICC line placement EXAM: PORTABLE CHEST 1 VIEW COMPARISON:  09/01/2016 FINDINGS: Stable cardiomegaly with aortic atherosclerosis. New left-sided PICC line tip terminates in the distal SVC. New confluent airspace  opacities are seen in the right lower lobe concerning for pneumonia. Apical pleuroparenchymal thickening and scarring seen at the right lung apex. No acute osseous abnormality. IMPRESSION: 1. PICC line tip in the distal SVC. 2. New confluent airspace opacity in the right lung base suspicious for pneumonia. 3. Cardiomegaly with aortic atherosclerosis. Electronically Signed   By: Ashley Royalty M.D.   On: 09/04/2016 21:11   Dg Abd 2 Views  Result Date: 09/03/2016 CLINICAL DATA:  Acute bleeding gastric ulcer. EXAM: ABDOMEN - 2 VIEW COMPARISON:  None. FINDINGS: No free air is identified. Three biopsy clips are seen in the right upper quadrant projecting over the expected location of the distal ascending and proximal transverse colon and one in the right lower quad. Scattered air containing small and large bowel loops without obstruction are noted. No pneumothorax.  Bone-on-bone apposition of the left hip with subchondral sclerosis and degenerative subchondral cystic change is identified. Aortoiliac atherosclerosis is noted. Injection granulomas project over the right gluteal region. No radio-opaque calculi or other significant radiographic abnormality is seen. IMPRESSION: 1. No free air identified. Scattered air containing small large bowel loops without obstruction. 2. Severe osteoarthritic joint space narrowing of the left hip with bone-on-bone apposition. 3. Three biopsy clips are seen in the right upper quadrant of the abdomen and one in the right lower quadrant. Electronically Signed   By: Ashley Royalty M.D.   On: 09/03/2016 19:31     Scheduled Meds: . atorvastatin  80 mg Oral QPC supper  . bisoprolol  2.5 mg Oral QHS  . brinzolamide  1 drop Both Eyes BID  . calcitRIOL  0.25 mcg Oral Q M,W,F  . citalopram  20 mg Oral QPC supper  . febuxostat  40 mg Oral Daily  . feeding supplement  1 Container Oral TID BM  . feeding supplement (ENSURE ENLIVE)  237 mL Oral BID BM  . ferrous gluconate  324 mg Oral QPC supper  . folic acid  1 mg Oral Daily  . hydrALAZINE  50 mg Oral BID  . insulin aspart  0-9 Units Subcutaneous TID WC  . insulin glargine  12 Units Subcutaneous QAC breakfast  . isosorbide mononitrate  60 mg Oral QPC supper  . latanoprost  1 drop Both Eyes QHS  . levothyroxine  50 mcg Oral QPC supper  . LORazepam  1 mg Oral Once  . mirtazapine  15 mg Oral QHS   Continuous Infusions: . sodium chloride    . fluconazole (DIFLUCAN) IV 100 mg (09/04/16 1100)  . pantoprozole (PROTONIX) infusion 8 mg/hr (09/05/16 0800)  . piperacillin-tazobactam (ZOSYN)  IV Stopped (09/05/16 0440)     LOS: 4 days    Chipper Oman, MD Pager: Text Page via [email or web address]  [phone]  If 7PM-7AM, please contact night-coverage [email or web address] Password [facility] 09/05/2016, 8:33 AM

## 2016-09-05 NOTE — Progress Notes (Signed)
Progress Note   Subjective  Chief Complaint: Upper Gi Bleed  Pt found laying comfortably in bed this morning, she tells me that she has plans for her day including getting out of bed and sitting in the chair later, which she is "excited" about. She denies any further BM overnight and is getting 1 uprbcs at the time of my interview. She does continue to cough and complains of epigastric pain with deep coughing.  Per nursing one "normal brown" solid stool this am.    Objective   Vital signs in last 24 hours: Temp:  [97.4 F (36.3 C)-99.2 F (37.3 C)] 99.2 F (37.3 C) (05/04 0914) Pulse Rate:  [33-115] 65 (05/04 0914) Resp:  [18-33] 24 (05/04 0914) BP: (129-170)/(32-146) 165/38 (05/04 0914) SpO2:  [90 %-100 %] 92 % (05/04 0914) Weight:  [149 lb 7.6 oz (67.8 kg)-150 lb 2.1 oz (68.1 kg)] 149 lb 7.6 oz (67.8 kg) (05/04 0424) Last BM Date: 09/05/16 General: Ill -appearing African American female in NAD Heart:  Regular rate and rhythm; no murmurs Lungs: Respirations even and unlabored, lungs CTA bilaterally Abdomen:  Soft, mild epigastric ttp and nondistended. Normal bowel sounds. Extremities:  Without edema. Neurologic:  Alert and oriented,  grossly normal neurologically. Psych:  Cooperative. Normal mood and affect.  Intake/Output from previous day: 05/03 0701 - 05/04 0700 In: 1105 [P.O.:200; I.V.:625; IV Piggyback:200] Out: 200 [Urine:200] Intake/Output this shift: Total I/O In: 55 [I.V.:25; Blood:30] Out: -   Lab Results:  Recent Labs (last 2 labs)    Recent Labs  09/04/16 0416 09/04/16 1758 09/05/16 0422  WBC 19.1* 19.8* 15.8*  HGB 7.5* 7.8* 7.1*  HCT 22.0* 23.1* 21.2*  PLT 70* 75* 65*     BMET  Recent Labs (last 2 labs)    Recent Labs  09/03/16 0330 09/04/16 0416 09/05/16 0422  NA 140 146* 146*  K 4.4 3.9 4.1  CL 116* 125* 124*  CO2 16* 15* 16*  GLUCOSE 197* 79 140*  BUN 68* 48* 37*  CREATININE 2.60* 2.38* 2.37*  CALCIUM 7.3* 7.4*  7.8*     LFT  Recent Labs (last 2 labs)    Recent Labs  09/04/16 0416  ALBUMIN 2.2*     PT/INR  Recent Labs (last 2 labs)    Recent Labs  09/02/16 1652  LABPROT 17.1*  INR 1.38      Studies/Results:  Imaging Results (Last 48 hours)  Dg Chest Port 1 View  Result Date: 09/04/2016 CLINICAL DATA:  PICC line placement EXAM: PORTABLE CHEST 1 VIEW COMPARISON:  09/01/2016 FINDINGS: Stable cardiomegaly with aortic atherosclerosis. New left-sided PICC line tip terminates in the distal SVC. New confluent airspace opacities are seen in the right lower lobe concerning for pneumonia. Apical pleuroparenchymal thickening and scarring seen at the right lung apex. No acute osseous abnormality. IMPRESSION: 1. PICC line tip in the distal SVC. 2. New confluent airspace opacity in the right lung base suspicious for pneumonia. 3. Cardiomegaly with aortic atherosclerosis. Electronically Signed   By: Ashley Royalty M.D.   On: 09/04/2016 21:11   Dg Abd 2 Views  Result Date: 09/03/2016 CLINICAL DATA:  Acute bleeding gastric ulcer. EXAM: ABDOMEN - 2 VIEW COMPARISON:  None. FINDINGS: No free air is identified. Three biopsy clips are seen in the right upper quadrant projecting over the expected location of the distal ascending and proximal transverse colon and one in the right lower quad. Scattered air containing small and large bowel loops without obstruction are noted.  No pneumothorax. Bone-on-bone apposition of the left hip with subchondral sclerosis and degenerative subchondral cystic change is identified. Aortoiliac atherosclerosis is noted. Injection granulomas project over the right gluteal region. No radio-opaque calculi or other significant radiographic abnormality is seen. IMPRESSION: 1. No free air identified. Scattered air containing small large bowel loops without obstruction. 2. Severe osteoarthritic joint space narrowing of the left hip with bone-on-bone apposition. 3. Three biopsy clips are  seen in the right upper quadrant of the abdomen and one in the right lower quadrant. Electronically Signed   By: Ashley Royalty M.D.   On: 09/03/2016 19:31        Assessment / Plan:   Assessment: 1. Upper GI Bleed:no further melenic stools, in fact pt had brown solid stool this morning per nursing, hgb did drop from 7.8-->7.1 today-pt was ordered 1 u prbcs 2. ABLA 3. Stage III non-small cell lung cancer: pt requested to stop chemo yesterday, this has been discussed with her daughter 70. CKD stage IV 5. Sepsis secondary to lobar pneumonia  Plan: 1. Continue to monitor hgb with transfusion as needed <7 2. Agree with 1 u prbcs today 3. Continue holding Plavix and ASA 4. Again, IR consult if brisk re-bleeding 5. Would continue PPI drip for another 24 hours and re-evaluate before changing to BID dosing 6. Continue clear liquid diet  Thank you for your kind consultation,we will continue to follow   LOS: 4 days   Levin Erp  09/05/2016, 9:37 AM  Pager # [phone]

## 2016-09-05 NOTE — Consult Note (Signed)
Consultation Note Date: 09/05/2016   Patient Name: Erica Jimenez  DOB: 1929-10-01  MRN: [id number]  Age / Sex: 83 y.o., female  PCP: [name] (Inactive) Referring Physician: Doreatha Lew, MD  Reason for Consultation: Establishing goals of care  HPI/Patient Profile: 83 y.o. female  admitted on 09/01/2016   Clinical Assessment and Goals of Care:  83 year old female with medical history significant for lung cancer, diabetes mellitus, hypertension, chronic systolic heart failure who presented to the ED with ongoing cough and shortness of breath with exertion. She was admitted for suspected sepsis secondary to possible HCAP, and was started on broad-spectrum IV antibiotics. On the night of 4/30-5/1, she reported a bloody bowel movement and coffee-ground emesis GI was consulted and emergent EGD was done which found esophageal candidiasis and bleeding ulcer which required hemostasis with epinephrine and clips. Patient also was given to 2 units of PRBC's. Patient treated with Protonix ggt for 72 hrs.  It is noted that the patient discussed with health care providers from hospital medicine service and GI service about foregoing further cancer treatments and wishing to have a code status of DNR DNI. A palliative consult has been placed for goals of care discussions.   The patient is resting in bed, she is awake alert. I introduced myself and palliative care as follows: Palliative medicine is specialized medical care for people living with serious illness. It focuses on providing relief from the symptoms and stress of a serious illness. The goal is to improve quality of life for both the patient and the family.  The patient is a very ill appearing woman, she is how ever awake, alert, able to converse meaningfully. The patient states that she lives with her daughter Erica Jimenez, who is her next of kin and only child.    Patient has a cough, is some what short of breath when she speaks in full sentences for long. How ever, she is able to state very clearly, that even though her daughter is having a difficult time accepting the patient's wishes, patient remains firm in her wishes to not undergo further cancer treatments, not undergo CPR and other aggressive measures at end of life. She wishes for gentle treatments and measures that will get her better enough to go home. With her permission, I discussed with her about hospice philosophy of care and how they can help at this stage. Patient wishes to go home with hospice when ever she is ready to leave the hospital. We will have to monitor trajectory and help decide. See recommendations below, thank you for the consult.    NEXT OF KIN  daughter Erica Jimenez, is her only child and next of kin, patient lives with Clinical cytogeneticist.   SUMMARY OF RECOMMENDATIONS    Agree with DNR DNI Patient is very clear about not undergoing further cancer treatments.  Patient wishes to continue this hospitalization to further stabilize from a GIB standpoint, agreeable to current measures, such as PPIs, liquid diet, PRBC etc.  Patient wished to learn more about hospice philosophy  of care, we talked about how hospice could be an extra layer of support, patient wishes to go home with daughter, along with hospice services following, towards the end of this hospitalization Call placed, unable to reach daughter. Patient states daughter is struggling with the decisions the patient is making regarding her health.  Thank you for the consult.   Code Status/Advance Care Planning:  DNR    Symptom Management:    continue current mode of care.   Palliative Prophylaxis:   Delirium Protocol  Psycho-social/Spiritual:   Desire for further Chaplaincy support:yes, for patient's spiritual support.   Additional Recommendations: Education on Hospice  Prognosis:   < 6 weeks, could be much shorter than that.    Discharge Planning: Home with hospice, depending on disease trajectory and hospital course.      Primary Diagnoses: Present on Admission: . Sepsis ([place]) . Type 2 diabetes mellitus with hyperlipidemia ([place]) . Stage IV squamous cell carcinoma of right lung ([place]) . Right lower lobe lung mass . CKD (chronic kidney disease), stage IV ([place]) . Anemia   I have reviewed the medical record, interviewed the patient and family, and examined the patient. The following aspects are pertinent.  Past Medical History:  Diagnosis Date  . A-V fistula ([place])    pt has 2  fistulas in rt arm  . Alopecia   . Anemia   . Anxiety   . Aortic stenosis   . Arthritis    gout  . Bladder cancer [facility])     s/p resection and BCG treatment.   Marland Kitchen CAD (coronary artery disease)     Pt is s/p anterior MI in 1996 with stent placed in LAD.  Last myoview in our office was in 7/05 and  showed apical anterior, apical, and apical inferior infarct.  Minimal peri-infarct ischemia.  EF 49%.   . Cardiomyopathy, ischemic   . CKD (chronic kidney disease)     Pt is stage III-IV.  She had a fistula placed but is not on dialysis.  She follows with Dr. Lorrene Reid for  nephrology.   Marland Kitchen COPD (chronic obstructive pulmonary disease) ([place])   . CVA (cerebral vascular accident) ([place])   . DM (diabetes mellitus) ([place])   . Full dentures   . Gout   . History of glaucoma   . HOH (hard of hearing)    slightly  . HTN (hypertension)   . Hyperlipidemia   . Hypothyroidism   . Myocardial infarction ([place])   . PAD (peripheral artery disease) ([facility])     arterial doppler study 12/04 suggestive of > 50% bilateral SFA stenosis.  Pt has mild claudication.  No invasive evaluation given CKD and desire to avoid contrast use.   Marland Kitchen Shortness of breath dyspnea    on exertion  . Wears glasses    Social History   Social History  . Marital status: Widowed    Spouse name: N/A  . Number of children: N/A  . Years of education: N/A   Social History Main  Topics  . Smoking status: Former Smoker    Packs/day: 0.35    Years: 60.00    Types: Cigarettes    Quit date: 06/30/2016  . Smokeless tobacco: Never Used  . Alcohol use 0.6 oz/week    1 Shots of liquor per week     Comment: occasional  . Drug use: No  . Sexual activity: Not Asked   Other Topics Concern  . None   Social History Narrative  . None  Family History  Problem Relation Age of Onset  . Breast cancer Mother   . Tuberculosis Father   . Heart disease Maternal Aunt    Scheduled Meds: . atorvastatin  80 mg Oral QPC supper  . bisoprolol  2.5 mg Oral QHS  . brinzolamide  1 drop Both Eyes BID  . calcitRIOL  0.25 mcg Oral Q M,W,F  . citalopram  20 mg Oral QPC supper  . febuxostat  40 mg Oral Daily  . feeding supplement  1 Container Oral TID BM  . feeding supplement (ENSURE ENLIVE)  237 mL Oral BID BM  . ferrous gluconate  324 mg Oral QPC supper  . folic acid  1 mg Oral Daily  . hydrALAZINE  50 mg Oral BID  . insulin aspart  0-9 Units Subcutaneous TID WC  . insulin glargine  12 Units Subcutaneous QAC breakfast  . isosorbide mononitrate  60 mg Oral QPC supper  . latanoprost  1 drop Both Eyes QHS  . levothyroxine  50 mcg Oral QPC supper  . LORazepam  1 mg Oral Once  . mirtazapine  30 mg Oral QHS  . multivitamin with minerals  1 tablet Oral Daily  . protein supplement  8 oz Oral BID   Continuous Infusions: . fluconazole (DIFLUCAN) IV Stopped (09/05/16 1231)  . pantoprozole (PROTONIX) infusion 8 mg/hr (09/05/16 1205)  . piperacillin-tazobactam (ZOSYN)  IV Stopped (09/05/16 1318)   PRN Meds:.HYDROcodone-homatropine, morphine injection, prochlorperazine, senna, simethicone Medications Prior to Admission:  Prior to Admission medications   Medication Sig Start Date End Date Taking? Authorizing Provider  aspirin 325 MG tablet Take 1 tablet (325 mg total) by mouth daily. 08/19/16  Yes Dron Tanna Furry, MD  atorvastatin (LIPITOR) 80 MG tablet Take 80 mg by mouth daily  after supper.    Yes Historical Provider, MD  bisoprolol (ZEBETA) 5 MG tablet take 1/2 tablet by mouth once daily **PLEASE KEEP UPCOMING APPOINTMENT FOR FURTHER REFILLS** 08/19/16  Yes Larey Dresser, MD  brinzolamide (AZOPT) 1 % ophthalmic suspension Place 1 drop into both eyes 2 (two) times daily.    Yes Historical Provider, MD  calcitRIOL (ROCALTROL) 0.25 MCG capsule Take 0.25 mcg by mouth every Monday, Wednesday, and Friday.    Yes Historical Provider, MD  Carboxymethylcellulose Sodium (REFRESH TEARS OP) Place 1 drop into both eyes 3 (three) times daily as needed (dry eyes). Both eyes.   Yes Historical Provider, MD  citalopram (CELEXA) 20 MG tablet Take 20 mg by mouth daily after supper.    Yes Historical Provider, MD  clopidogrel (PLAVIX) 75 MG tablet Take 75 mg by mouth daily after supper.    Yes Historical Provider, MD  febuxostat (ULORIC) 40 MG tablet Take 40 mg by mouth daily.   Yes Historical Provider, MD  Ferrous Gluconate (FERGON PO) Take 1 tablet by mouth daily after supper.    Yes Historical Provider, MD  folic acid (FOLVITE) 1 MG tablet Take 1 mg by mouth daily.   Yes Historical Provider, MD  furosemide (LASIX) 40 MG tablet Take 40 mg by mouth daily.    Yes Historical Provider, MD  hydrALAZINE (APRESOLINE) 50 MG tablet Take 1 tablet (50 mg total) by mouth 2 (two) times daily. 08/11/16  Yes Larey Dresser, MD  insulin glargine (LANTUS) 100 unit/mL SOPN Inject 12 Units into the skin daily before breakfast.   Yes Historical Provider, MD  insulin lispro (HUMALOG KWIKPEN) 100 UNIT/ML KiwkPen Inject 2-3 Units into the skin 3 (three) times daily before  meals.   Yes Historical Provider, MD  isosorbide mononitrate (IMDUR) 60 MG 24 hr tablet Take 60 mg by mouth daily after supper.    Yes Historical Provider, MD  latanoprost (XALATAN) 0.005 % ophthalmic solution Place 1 drop into both eyes at bedtime.    Yes Historical Provider, MD  levothyroxine (SYNTHROID, LEVOTHROID) 50 MCG tablet Take 50 mcg  by mouth daily after supper.    Yes Historical Provider, MD  lidocaine-prilocaine (EMLA) cream Apply 1 application topically as needed. Patient taking differently: Apply 1 application topically daily as needed (vaginal itching).  08/14/16  Yes Curt Bears, MD  mirtazapine (REMERON) 30 MG tablet Take 15 mg by mouth at bedtime. 06/18/16  Yes Historical Provider, MD  nitroGLYCERIN (NITROSTAT) 0.4 MG SL tablet Place 1 tablet (0.4 mg total) under the tongue every 5 (five) minutes as needed for chest pain. 10/26/14  Yes Larey Dresser, MD  prochlorperazine (COMPAZINE) 10 MG tablet Take 1 tablet (10 mg total) by mouth every 6 (six) hours as needed for nausea or vomiting. 08/14/16  Yes Curt Bears, MD  senna (SENOKOT) 8.6 MG tablet Take 1 tablet by mouth daily as needed for constipation.    Yes Historical Provider, MD  predniSONE (DELTASONE) 20 MG tablet 2 tabs po daily x 4 days Patient not taking: Reported on 09/01/2016 08/24/16   Deno Etienne, DO   Allergies  Allergen Reactions  . Contrast Media [Iodinated Diagnostic Agents] Other (See Comments)    Renal insufficiency  . Fluoxetine Rash  . Sulfonamide Derivatives Nausea Only   Review of Systems +cough  Physical Exam Weak appearing S1 S2 Abdomen soft Clear Awake alert oriented No edema  Vital Signs: BP (!) 130/49   Pulse (!) 52   Temp 99.1 F (37.3 C) (Oral)   Resp 14   Ht '5\' 6"'$  (1.676 m)   Wt 67.8 kg (149 lb 7.6 oz)   SpO2 94%   BMI 24.13 kg/m  Pain Assessment: No/denies pain   Pain Score: Asleep   SpO2: SpO2: 94 % O2 Device:SpO2: 94 % O2 Flow Rate: .O2 Flow Rate (L/min): 3 L/min  IO: Intake/output summary:  Intake/Output Summary (Last 24 hours) at 09/05/16 1253 Last data filed at 09/05/16 1109  Gross per 24 hour  Intake          1089.67 ml  Output              200 ml  Net           889.67 ml    LBM: Last BM Date: 09/05/16 Baseline Weight: Weight: 63.5 kg (140 lb) Most recent weight: Weight: 67.8 kg (149 lb 7.6 oz)      Palliative Assessment/Data:   Flowsheet Rows     Most Recent Value  Intake Tab  Referral Department  Hospitalist  Unit at Time of Referral  Intermediate Care Unit  Palliative Care Primary Diagnosis  Cancer  Date Notified  09/05/16  Palliative Care Type  New Palliative care  Reason for referral  Clarify Goals of Care  Date of Admission  09/01/16  Date first seen by Palliative Care  09/05/16  # of days IP prior to Palliative referral  4  Clinical Assessment  Palliative Performance Scale Score  30%  Pain Max last 24 hours  4  Pain Min Last 24 hours  3  Dyspnea Max Last 24 Hours  4  Dyspnea Min Last 24 hours  3  Nausea Max Last 24 Hours  4  Nausea Min  Last 24 Hours  3  Psychosocial & Spiritual Assessment  Palliative Care Outcomes  Patient/Family meeting held?  Yes  Who was at the meeting?  patient who is alert awake and decisional.   Palliative Care Outcomes  Provided advance care planning      Time In:  10 Time Out:  11.10 Time Total:   70 min  Greater than 50%  of this time was spent counseling and coordinating care related to the above assessment and plan.  Signed by: Loistine Chance, MD  [phone]  Please contact Palliative Medicine Team phone at [phone] for questions and concerns.  For individual provider: See Shea Evans

## 2016-09-05 NOTE — Progress Notes (Signed)
Nutrition Follow-up  DOCUMENTATION CODES:   Non-severe (moderate) malnutrition in context of chronic illness  INTERVENTION:  - Continue CLD and advance diet as medically feasible. - Continue Boost Breeze TID, each supplement provides 250 kcal and 9 grams of protein - Will order [place] Chicken Soup BID, each 8 ounce serving provides 100kcal and 21g protein - Will order daily multivitamin with minerals.   - Continue to encourage PO intakes of meals and supplements.   NUTRITION DIAGNOSIS:   Malnutrition related to cancer and cancer related treatments as evidenced by moderate depletions of muscle mass, mild depletion of body fat. -ongoing  GOAL:   Patient will meet greater than or equal to 90% of their needs -unmet with current diet order  MONITOR:   PO intake, Supplement acceptance, Diet advancement, Weight trends, Labs  ASSESSMENT:   83 y.o. female with a past medical history significant for lung cancer on palliative systemic chemotherapy with carboplatin and paclitaxel, last infusion 08/21/16, as well as dyslipidemia, diabetes on insulin, hypertension, chronic systolic CHF last echo in April with an ejection fraction of 45%, who presented to the ED with ongoing cough productive of white to yellow sputum, worsening over the past couple of days. She was diagnosed with sepsis secondary to lobar pneumonia. Pt now with coffee ground emesis   5/4 Diet advanced from NPO to CLD on 5/2 @ ~1545 with no documented intakes since that time. She reports doing well with broth, water, and has been sipping on juice, including a Boost Breeze provided by RN this AM. Pt denies discomfort with PO intakes at this time but states that poor appetite continues to be an issue. She is hopeful to get up to the chair later today and feels that this movement may help increase her appetite.   Pt had upper GI Endoscopy on 5/1 which showed: - monilial esophagitis - low-grade narrowing Schatzki ring at GE junction -  non-bleeding ulcerative gastritis  - multiple duodenal ulcers with 1 actively bleeding  Weight +3 kg since previous RD assessment. Will order oral nutrition supplements as outlined above for while on CLD. Ensure Enlive was ordered BID during previous assessment for when diet advanced to at least FLD.   Medications reviewed; 1 mg oral folic acid/day, sliding scale Novolog, 12 units Lantus/day, 50 mcg oral Synthroid/day.  Labs reviewed; CBG: 153 mg/dL this AM, Na: 146 mmol/L, Cl: 124 mmol/L, BUN: 37 mg/dL, creatinine: 2.37 mg/dL, Ca: 7.8 mg/dL, GFR: 21 mL/min.    5/1 - Met with pt in room today.  - Pt reports a steady decrease in her appetite since Feb 2017.  - Pt reports poor oral intake for several months PTA.  - Pt currently NPO for EGD today.  - Per MD note, patient with upper GI bleed scheduled to get prbcs today as pt with melena and coffee ground emesis.  - Per chart, pt is weight stable.  - RD will go ahead and order supplements for pt that she can have once diet advanced.      Diet Order:  Diet clear liquid Room service appropriate? Yes; Fluid consistency: Thin  Skin:  Reviewed, no issues  Last BM:  5/4  Height:   Ht Readings from Last 1 Encounters:  09/03/16 '5\' 6"'  (1.676 m)    Weight:   Wt Readings from Last 1 Encounters:  09/05/16 149 lb 7.6 oz (67.8 kg)    Ideal Body Weight:  55.6 kg  BMI:  Body mass index is 24.13 kg/m.  Estimated Nutritional  Needs:   Kcal:  1600-1800kcal/day   Protein:  82-95g/day   Fluid:  >1.6L/day   EDUCATION NEEDS:   No education needs identified at this time    Jarome Matin, MS, RD, LDN, CNSC Inpatient Clinical Dietitian Pager # [phone] After hours/weekend pager # [phone]

## 2016-09-05 NOTE — Evaluation (Signed)
Physical Therapy Evaluation Patient Details Name: Erica Jimenez MRN: [id number] DOB: 08-Apr-1930 Today's Date: 09/05/2016   History of Present Illness  Pt admitted wtih sepsis, possible HCAP and GIB.  Pt with hx of lung CA, DM, MI, CVA, COPD, CKD and CAD  Clinical Impression  Pt admitted as above and presenting with functional mobility limitations 2* generalized weakness, SOB with exertion and ambulatory balance deficits.  Pt hopes to progress to dc home but dependent on acute stay progress and level of assist available at home.    Follow Up Recommendations Home health PT;SNF;Supervision/Assistance - 24 hour    Equipment Recommendations  None recommended by PT    Recommendations for Other Services OT consult     Precautions / Restrictions Precautions Precautions: Fall Restrictions Weight Bearing Restrictions: No      Mobility  Bed Mobility Overal bed mobility: Modified Independent             General bed mobility comments: Pt self assisted to EOB utilizing bed rail and increased time  Transfers Overall transfer level: Needs assistance Equipment used: Rolling walker (2 wheeled) Transfers: Sit to/from Stand Sit to Stand: Min assist         General transfer comment: cues for use of UEs to self assist and min assist to bring wt up and fwd and to steady in initial standing  Ambulation/Gait Ambulation/Gait assistance: Min assist Ambulation Distance (Feet): 23 Feet Assistive device: Rolling walker (2 wheeled) Gait Pattern/deviations: Step-through pattern;Decreased step length - right;Decreased step length - left;Shuffle;Trunk flexed Gait velocity: decreased Gait velocity interpretation: Below normal speed for age/gender General Gait Details: cues for posture and position from RW.  Increased time with several standing rest breaks 2* fatigue/SOB  Stairs            Wheelchair Mobility    Modified Rankin (Stroke Patients Only)       Balance Overall balance  assessment: Needs assistance Sitting-balance support: No upper extremity supported;Feet supported Sitting balance-Leahy Scale: Good     Standing balance support: No upper extremity supported Standing balance-Leahy Scale: Fair                               Pertinent Vitals/Pain Pain Assessment: No/denies pain    Home Living Family/patient expects to be discharged to:: Private residence Living Arrangements: Children Available Help at Discharge: Available PRN/intermittently Type of Home: House Home Access: Stairs to enter Entrance Stairs-Rails: Right Entrance Stairs-Number of Steps: 2 Home Layout: One level Home Equipment: Walker - 2 wheels;Cane - single point;Bedside commode      Prior Function Level of Independence: Independent;Independent with assistive device(s)         Comments: using cane as needed     Hand Dominance        Extremity/Trunk Assessment   Upper Extremity Assessment Upper Extremity Assessment: Generalized weakness    Lower Extremity Assessment Lower Extremity Assessment: Generalized weakness    Cervical / Trunk Assessment Cervical / Trunk Assessment: Kyphotic  Communication   Communication: No difficulties  Cognition Arousal/Alertness: Awake/alert Behavior During Therapy: WFL for tasks assessed/performed Overall Cognitive Status: Within Functional Limits for tasks assessed                                        General Comments      Exercises General Exercises - Lower Extremity Ankle  Circles/Pumps: AROM;Supine;15 reps;Both   Assessment/Plan    PT Assessment    PT Problem List Decreased strength;Decreased activity tolerance;Decreased balance;Decreased mobility;Decreased knowledge of use of DME       PT Treatment Interventions DME instruction;Gait training;Stair training;Functional mobility training;Therapeutic activities;Therapeutic exercise;Balance training;Patient/family education    PT Goals  (Current goals can be found in the Care Plan section)  Acute Rehab PT Goals Patient Stated Goal: to go home PT Goal Formulation: With patient Time For Goal Achievement: 09/19/16 Potential to Achieve Goals: Fair    Frequency Min 3X/week   Barriers to discharge Decreased caregiver support Pt lives with dtr - pt states dtr works during the day    Co-evaluation               AM-PAC PT "6 Clicks" Daily Activity  Outcome Measure Difficulty turning over in bed (including adjusting bedclothes, sheets and blankets)?: A Little Difficulty moving from lying on back to sitting on the side of the bed? : A Little Difficulty sitting down on and standing up from a chair with arms (e.g., wheelchair, bedside commode, etc,.)?: A Little Help needed moving to and from a bed to chair (including a wheelchair)?: A Little Help needed walking in hospital room?: A Little Help needed climbing 3-5 steps with a railing? : A Lot 6 Click Score: 17    End of Session Equipment Utilized During Treatment: Gait belt Activity Tolerance: Patient tolerated treatment well Patient left: in chair;with call bell/phone within reach;with chair alarm set;with nursing/sitter in room Nurse Communication: Mobility status PT Visit Diagnosis: Unsteadiness on feet (R26.81);Difficulty in walking, not elsewhere classified (R26.2);Muscle weakness (generalized) (M62.81)    Time: 2992-4268 PT Time Calculation (min) (ACUTE ONLY): 29 min   Charges:   PT Evaluation $PT Eval Low Complexity: 1 Procedure PT Treatments $Gait Training: 8-22 mins   PT G Codes:        Pg [phone]   Erica Jimenez 09/05/2016, 5:47 PM

## 2016-09-05 NOTE — Progress Notes (Addendum)
Pharmacy Antibiotic Note  Erica Jimenez is a 83 y.o. female with history of lung cancer admitted on 09/01/2016 with sepsis 2/2 pneumonia.  Patient currently on Zosyn for HCAP and Fluconazole for esophageal candidiasis with dosing assistance per pharmacy.   Today is day #5 Zosyn, day #3 fluconazole.  SCr near baseline 2.3.  WBC improving, patient afebrile.  QTc on flowsheet WNL - monitoring with Celexa and Fluconazole combination.  Plan: -Continue Zosyn 2.25g IV q6h -Continue Fluconazole '100mg'$  q24h.  Will switch to PO since patient tolerating other PO medications to complete the 7 day course indicated by MD.   Height: '5\' 6"'$  (167.6 cm) Weight: 149 lb 7.6 oz (67.8 kg) IBW/kg (Calculated) : 59.3  Temp (24hrs), Avg:98.7 F (37.1 C), Min:97.4 F (36.3 C), Max:99.2 F (37.3 C)   Recent Labs Lab 09/01/16 1308 09/01/16 1554 09/01/16 1825  09/02/16 1652 09/02/16 2151 09/03/16 0330 09/03/16 1435 09/04/16 0416 09/04/16 1758 09/05/16 0422  WBC 27.0*  --   --   < > 29.8* 32.7* 24.3* 18.8* 19.1* 19.8* 15.8*  CREATININE 2.30*  --   --   --   --   --  2.60*  --  2.38*  --  2.37*  LATICACIDVEN  --  1.66 2.3*  --  3.5* 2.8* 1.2  --   --   --   --   < > = values in this interval not displayed.  Estimated Creatinine Clearance: 16.5 mL/min (A) (by C-G formula based on SCr of 2.37 mg/dL (H)).    Allergies  Allergen Reactions  . Contrast Media [Iodinated Diagnostic Agents] Other (See Comments)    Renal insufficiency  . Fluoxetine Rash  . Sulfonamide Derivatives Nausea Only    Antimicrobials this admission: 4/30 vanc >> 5/1 4/30 zosyn >> 5/2 fluconazole >>  Dose adjustments this admission: --  Microbiology results: 4/30 BCx: Micrococcus species grew on 1 collection only; nothing detected on BCID; likely contaminant 5/2 Repeat BCx: ngtd  5/1 MRSA PCR: negative  Thank you for allowing pharmacy to be a part of this patient's care.  Hershal Coria, PharmD, BCPS Pager:  [phone] 09/05/2016 1:22 PM

## 2016-09-05 NOTE — Progress Notes (Deleted)
Progress Note   Subjective  Chief Complaint: Upper Gi Bleed  Pt found laying comfortably in bed this morning, she tells me that she has plans for her day including getting out of bed and sitting in the chair later, which she is "excited" about. She denies any further BM overnight and is getting 1 uprbcs at the time of my interview. She does continue to cough and complains of epigastric pain with deep coughing.  Per nursing one "normal brown" solid stool this am.    Objective   Vital signs in last 24 hours: Temp:  [97.4 F (36.3 C)-99.2 F (37.3 C)] 99.2 F (37.3 C) (05/04 0914) Pulse Rate:  [33-115] 65 (05/04 0914) Resp:  [18-33] 24 (05/04 0914) BP: (129-170)/(32-146) 165/38 (05/04 0914) SpO2:  [90 %-100 %] 92 % (05/04 0914) Weight:  [149 lb 7.6 oz (67.8 kg)-150 lb 2.1 oz (68.1 kg)] 149 lb 7.6 oz (67.8 kg) (05/04 0424) Last BM Date: 09/05/16 General: Ill -appearing African American female in NAD Heart:  Regular rate and rhythm; no murmurs Lungs: Respirations even and unlabored, lungs CTA bilaterally Abdomen:  Soft, mild epigastric ttp and nondistended. Normal bowel sounds. Extremities:  Without edema. Neurologic:  Alert and oriented,  grossly normal neurologically. Psych:  Cooperative. Normal mood and affect.  Intake/Output from previous day: 05/03 0701 - 05/04 0700 In: 1105 [P.O.:200; I.V.:625; IV Piggyback:200] Out: 200 [Urine:200] Intake/Output this shift: Total I/O In: 55 [I.V.:25; Blood:30] Out: -   Lab Results:  Recent Labs  09/04/16 0416 09/04/16 1758 09/05/16 0422  WBC 19.1* 19.8* 15.8*  HGB 7.5* 7.8* 7.1*  HCT 22.0* 23.1* 21.2*  PLT 70* 75* 65*   BMET  Recent Labs  09/03/16 0330 09/04/16 0416 09/05/16 0422  NA 140 146* 146*  K 4.4 3.9 4.1  CL 116* 125* 124*  CO2 16* 15* 16*  GLUCOSE 197* 79 140*  BUN 68* 48* 37*  CREATININE 2.60* 2.38* 2.37*  CALCIUM 7.3* 7.4* 7.8*   LFT  Recent Labs  09/04/16 0416  ALBUMIN 2.2*    PT/INR  Recent Labs  09/02/16 1652  LABPROT 17.1*  INR 1.38    Studies/Results: Dg Chest Port 1 View  Result Date: 09/04/2016 CLINICAL DATA:  PICC line placement EXAM: PORTABLE CHEST 1 VIEW COMPARISON:  09/01/2016 FINDINGS: Stable cardiomegaly with aortic atherosclerosis. New left-sided PICC line tip terminates in the distal SVC. New confluent airspace opacities are seen in the right lower lobe concerning for pneumonia. Apical pleuroparenchymal thickening and scarring seen at the right lung apex. No acute osseous abnormality. IMPRESSION: 1. PICC line tip in the distal SVC. 2. New confluent airspace opacity in the right lung base suspicious for pneumonia. 3. Cardiomegaly with aortic atherosclerosis. Electronically Signed   By: Ashley Royalty M.D.   On: 09/04/2016 21:11   Dg Abd 2 Views  Result Date: 09/03/2016 CLINICAL DATA:  Acute bleeding gastric ulcer. EXAM: ABDOMEN - 2 VIEW COMPARISON:  None. FINDINGS: No free air is identified. Three biopsy clips are seen in the right upper quadrant projecting over the expected location of the distal ascending and proximal transverse colon and one in the right lower quad. Scattered air containing small and large bowel loops without obstruction are noted. No pneumothorax. Bone-on-bone apposition of the left hip with subchondral sclerosis and degenerative subchondral cystic change is identified. Aortoiliac atherosclerosis is noted. Injection granulomas project over the right gluteal region. No radio-opaque calculi or other significant radiographic abnormality is seen. IMPRESSION: 1. No free air identified. Scattered  air containing small large bowel loops without obstruction. 2. Severe osteoarthritic joint space narrowing of the left hip with bone-on-bone apposition. 3. Three biopsy clips are seen in the right upper quadrant of the abdomen and one in the right lower quadrant. Electronically Signed   By: Ashley Royalty M.D.   On: 09/03/2016 19:31       Assessment /  Plan:   Assessment: 1. Upper GI Bleed:no further melenic stools, in fact pt had brown solid stool this morning per nursing, hgb did drop from 7.8-->7.1 today-pt was ordered 1 u prbcs 2. ABLA 3. Stage III non-small cell lung cancer: pt requested to stop chemo yesterday, this has been discussed with her daughter 78. CKD stage IV 5. Sepsis secondary to lobar pneumonia  Plan: 1. Continue to monitor hgb with transfusion as needed <7 2. Agree with 1 u prbcs today 3. Continue holding Plavix and ASA 4. Again, IR consult if brisk re-bleeding 5. Can transition to Pantoprazole '40mg'$  BID this afternoon or tomorrow morning 6. Continue clear liquid diet  Thank you for your kind consultation,we will continue to follow   LOS: 4 days   Levin Erp  09/05/2016, 9:37 AM  Pager # [phone]

## 2016-09-06 ENCOUNTER — Encounter (HOSPITAL_COMMUNITY): Payer: Self-pay

## 2016-09-06 LAB — BPAM RBC
BLOOD PRODUCT EXPIRATION DATE: 201805172359
BLOOD PRODUCT EXPIRATION DATE: 201805172359
Blood Product Expiration Date: 201805172359
Blood Product Expiration Date: 201805172359
ISSUE DATE / TIME: 201805011400
ISSUE DATE / TIME: 201805011409
ISSUE DATE / TIME: 201805040842
UNIT TYPE AND RH: 6200
UNIT TYPE AND RH: 6200
UNIT TYPE AND RH: 6200
Unit Type and Rh: 6200

## 2016-09-06 LAB — GLUCOSE, CAPILLARY
GLUCOSE-CAPILLARY: 75 mg/dL (ref 65–99)
Glucose-Capillary: 146 mg/dL — ABNORMAL HIGH (ref 65–99)
Glucose-Capillary: 79 mg/dL (ref 65–99)
Glucose-Capillary: 197 mg/dL — ABNORMAL HIGH (ref 65–99)

## 2016-09-06 LAB — TYPE AND SCREEN
ABO/RH(D): A POS
Antibody Screen: NEGATIVE
UNIT DIVISION: 0
Unit division: 0
Unit division: 0
Unit division: 0

## 2016-09-06 LAB — CBC
HEMATOCRIT: 24.8 % — AB (ref 36.0–46.0)
Hemoglobin: 8.2 g/dL — ABNORMAL LOW (ref 12.0–15.0)
MCH: 30.5 pg (ref 26.0–34.0)
MCHC: 33.1 g/dL (ref 30.0–36.0)
MCV: 92.2 fL (ref 78.0–100.0)
Platelets: 68 10*3/uL — ABNORMAL LOW (ref 150–400)
RBC: 2.69 MIL/uL — ABNORMAL LOW (ref 3.87–5.11)
RDW: 17.4 % — AB (ref 11.5–15.5)
WBC: 12.8 10*3/uL — AB (ref 4.0–10.5)

## 2016-09-06 LAB — BASIC METABOLIC PANEL
Anion gap: 4 — ABNORMAL LOW (ref 5–15)
BUN: 28 mg/dL — AB (ref 6–20)
CO2: 17 mmol/L — AB (ref 22–32)
CREATININE: 2.24 mg/dL — AB (ref 0.44–1.00)
Calcium: 7.8 mg/dL — ABNORMAL LOW (ref 8.9–10.3)
Chloride: 125 mmol/L — ABNORMAL HIGH (ref 101–111)
GFR calc Af Amer: 22 mL/min — ABNORMAL LOW (ref >60)
GFR calc non Af Amer: 19 mL/min — ABNORMAL LOW (ref >60)
GLUCOSE: 77 mg/dL (ref 65–99)
POTASSIUM: 4.2 mmol/L (ref 3.5–5.1)
Sodium: 146 mmol/L — ABNORMAL HIGH (ref 135–145)

## 2016-09-06 LAB — IRON AND TIBC
Iron: 32 ug/dL (ref 28–170)
SATURATION RATIOS: 20 % (ref 10.4–31.8)
TIBC: 164 ug/dL — ABNORMAL LOW (ref 250–450)
UIBC: 132 ug/dL

## 2016-09-06 LAB — VITAMIN B12: Vitamin B-12: 2382 pg/mL — ABNORMAL HIGH (ref 180–914)

## 2016-09-06 LAB — RETICULOCYTES
RBC.: 2.75 MIL/uL — AB (ref 3.87–5.11)
RETIC COUNT ABSOLUTE: 96.3 10*3/uL (ref 19.0–186.0)
Retic Ct Pct: 3.5 % — ABNORMAL HIGH (ref 0.4–3.1)

## 2016-09-06 LAB — CULTURE, BLOOD (ROUTINE X 2)
Culture: NO GROWTH
Special Requests: ADEQUATE

## 2016-09-06 LAB — FERRITIN: Ferritin: 538 ng/mL — ABNORMAL HIGH (ref 11–307)

## 2016-09-06 LAB — MAGNESIUM: Magnesium: 1.8 mg/dL (ref 1.7–2.4)

## 2016-09-06 LAB — FOLATE: FOLATE: 58.8 ng/mL (ref >5.9)

## 2016-09-06 MED ORDER — PANTOPRAZOLE SODIUM 40 MG PO TBEC
40.0000 mg | DELAYED_RELEASE_TABLET | Freq: Two times a day (BID) | ORAL | Status: DC
  Administered 2016-09-06 – 2016-09-08 (×4): 40 mg via ORAL
  Filled 2016-09-06 (×4): qty 1

## 2016-09-06 MED ORDER — AMOXICILLIN-POT CLAVULANATE 500-125 MG PO TABS
1.0000 | ORAL_TABLET | Freq: Two times a day (BID) | ORAL | Status: DC
  Filled 2016-09-06: qty 1

## 2016-09-06 MED ORDER — AMOXICILLIN-POT CLAVULANATE 500-125 MG PO TABS
1.0000 | ORAL_TABLET | Freq: Two times a day (BID) | ORAL | Status: DC
  Administered 2016-09-06 – 2016-09-08 (×5): 500 mg via ORAL
  Filled 2016-09-06 (×4): qty 1

## 2016-09-06 MED ORDER — CITALOPRAM HYDROBROMIDE 20 MG PO TABS
10.0000 mg | ORAL_TABLET | Freq: Every day | ORAL | Status: DC
  Administered 2016-09-06 – 2016-09-07 (×2): 10 mg via ORAL
  Filled 2016-09-06 (×2): qty 1

## 2016-09-06 NOTE — Progress Notes (Signed)
PROGRESS NOTE [facility]   Erica Jimenez   OHY:[id number] DOB: 1929-05-16  DOA: 09/01/2016 PCP: [name] (Inactive)   Brief Narrative:  83 year old female with medical history significant for lung cancer, diabetes mellitus, hypertension, chronic systolic heart failure who presented to the ED with ongoing cough and shortness of breath with exertion. She was admitted for suspected sepsis secondary to possible HCAP, and was started on broad-spectrum IV antibiotic. On the night of 4/30-5/1, she report to bloody bowel movement and coffee-ground emesis GI was consulted and emergent EGD was done which found esophageal candidiasis and bleeding ulcer which were hemostasis with epinephrine and clips. Patient also was given to 2 units of PRBC's. Patient treated with Protonix ggt for 72 hrs.   Subjective: Continues to improve, had a regular bowel movement today. Hemoglobin continues to be stable  Assessment & Plan: Sepsis secondary to HCAP - sepsis physiology has resolved Checks x-ray shows right anterior lymphadenopathy and multifocal masses which is improving from last study, although patient with cough and general malaise and procalcitonin of 1.08 Patient initially started on vancomycin and Zosyn. Vancomycin discontinued on 09/02/2016 - MRSA negative Procalcitonin elevated although not reliable on CKD patients Patient received 6 days of Zosyn, white count continues to improve will change to Augmentin to complete total of the date of antibiotics. Blood cultures thus far NGTD  Continue supportive treatment with Hycodan  Acute blood loss anemia secondary to PUD in setting of ASA and Plavix  Status post 3 units of PRBCs during hospital stay  Status post EGD with hemostasis - bleeding seems to have stopped at this point FOBT was positive Continue to hold aspirin and Plavix - patient may benefit of discontinue Plavix completely Continue current regimen: Protonix ggt for total of 72 hrs  then switch to BID  Diet advance to soft per GI recommendations continue Remeron for appetite stimulation Check CBC in the morning if we'll discuss home with hospice care  Esophageal candidiasis Continue current regimen x 7 days- Diflucan   Acute on chronic kidney disease stage IV - Improved Cr back to baseline Baseline creatinine is 2.3.  Lasix on hold, creatinine continues to improve and is at baseline.  Check creatinine in the morning  Afib - per patient she has had this in the past,  HR is well controlled  CHA2DS2VASc > 5, patient needs a/c but with recent bleed will hold on that. She can discuss further therapy as outpatient. On BB will continue to monitor   Essential hypertension - continues to be soft  Continue current regimen   Coronary artery disease, history of CVA Will likely to discontinue indefinitely Plavix, keep holding aspirin for now Asymptomatic Continue to monitor  Chronic systolic CHF  Seems to be compensated Continue current regimen: home medication, except Lasix which is on hold due to increasing creatinine Monitor creatinine in the morning  Diabetes mellitus type 2 - CBG's stable  Continue current regimen  Lantus and SSI Monitor CBGs  Stage III non-small cell lung cancer  Diagnosis in March 2018 - patient reports to me that she does not wants to continue chemotherapy Case discussed with Dr. [name], he will discuss prognosis and further plan as outpatient.   Goals of care   Palliative care recommendations appreciated, will discuss with daughter if she can care for the patient in combination with hospice care at home as patient wishes to go home.  DVT prophylaxis: SCD's  Code Status: FULL  Family Communication: None at bedside  Disposition Plan:  Keep in SDU as high risk for re-bleeding  Consultants:   GI   Procedures:   EGD done on May 1 of 2018  Antimicrobials: Anti-infectives    Start     Dose/Rate Route Frequency Ordered Stop    09/06/16 1000  fluconazole (DIFLUCAN) tablet 100 mg     100 mg Oral Daily 09/05/16 1321 09/10/16 0959   09/04/16 1000  fluconazole (DIFLUCAN) IVPB 100 mg  Status:  Discontinued     100 mg 50 mL/hr over 60 Minutes Intravenous Every 24 hours 09/03/16 0844 09/05/16 1321   09/03/16 0900  fluconazole (DIFLUCAN) IVPB 200 mg     200 mg 100 mL/hr over 60 Minutes Intravenous  Once 09/03/16 0842 09/03/16 1038   09/01/16 1515  piperacillin-tazobactam (ZOSYN) IVPB 3.375 g  Status:  Discontinued     3.375 g 100 mL/hr over 30 Minutes Intravenous  Once 09/01/16 1502 09/01/16 1504   09/01/16 1515  vancomycin (VANCOCIN) IVPB 1000 mg/200 mL premix  Status:  Discontinued     1,000 mg 200 mL/hr over 60 Minutes Intravenous  Once 09/01/16 1502 09/01/16 1505   09/01/16 1415  piperacillin-tazobactam (ZOSYN) IVPB 2.25 g     2.25 g 100 mL/hr over 30 Minutes Intravenous Every 6 hours 09/01/16 1400     09/01/16 1400  piperacillin-tazobactam (ZOSYN) IVPB 3.375 g  Status:  Discontinued     3.375 g 100 mL/hr over 30 Minutes Intravenous  Once 09/01/16 1355 09/01/16 1400   09/01/16 1400  vancomycin (VANCOCIN) IVPB 1000 mg/200 mL premix     1,000 mg 200 mL/hr over 60 Minutes Intravenous  Once 09/01/16 1355 09/01/16 1516       Objective: Vitals:   09/06/16 0459 09/06/16 0522 09/06/16 0700 09/06/16 0800  BP:    (!) 147/43  Pulse:   (!) 37 (!) 50  Resp:   (!) 27 19  Temp:  98.8 F (37.1 C)    TempSrc:  Oral    SpO2:   (!) 86% 94%  Weight: 69.7 kg (153 lb 10.6 oz)     Height:        Intake/Output Summary (Last 24 hours) at 09/06/16 0912 Last data filed at 09/06/16 0600  Gross per 24 hour  Intake          1074.67 ml  Output                0 ml  Net          1074.67 ml   Filed Weights   09/05/16 0411 09/05/16 0424 09/06/16 0459  Weight: 68.1 kg (150 lb 2.1 oz) 67.8 kg (149 lb 7.6 oz) 69.7 kg (153 lb 10.6 oz)    Examination: No changes on exam from 09/05/2016  General exam: Calm  Respiratory system:  Clear to auscultation   Cardiovascular system: S1S2 no murmur  Gastrointestinal system: Abd soft NTND no masses palpated  Central nervous system: Alert and oriented x 3  Extremities: No peripheral edema  Skin: No rashes  Psychiatry: Mood appropriate. Judgement intact   Data Reviewed: I have personally reviewed following labs and imaging studies  CBC:  Recent Labs Lab 09/01/16 1308  09/03/16 1435  09/04/16 0416 09/04/16 1758 09/05/16 0422 09/05/16 1354 09/06/16 0501  WBC 27.0*  < > 18.8*  --  19.1* 19.8* 15.8*  --  12.8*  NEUTROABS 21.3*  --   --   --   --   --   --   --   --  HGB 10.3*  < > 7.3*  7.2*  < > 7.5* 7.8* 7.1* 8.9* 8.2*  HCT 31.8*  < > 21.5*  < > 22.0* 23.1* 21.2* 26.8* 24.8*  MCV 90.1  < > 90.0  --  89.4 90.9 91.0  --  92.2  PLT 152  < > 64*  --  70* 75* 65*  --  68*  < > = values in this interval not displayed. Basic Metabolic Panel:  Recent Labs Lab 09/01/16 1308 09/03/16 0330 09/04/16 0416 09/05/16 0422 09/06/16 0501  NA 130* 140 146* 146* 146*  K 4.2 4.4 3.9 4.1 4.2  CL 103 116* 125* 124* 125*  CO2 17* 16* 15* 16* 17*  GLUCOSE 326* 197* 79 140* 77  BUN 68* 68* 48* 37* 28*  CREATININE 2.30* 2.60* 2.38* 2.37* 2.24*  CALCIUM 8.3* 7.3* 7.4* 7.8* 7.8*  MG  --   --   --   --  1.8   GFR: Estimated Creatinine Clearance: 17.5 mL/min (A) (by C-G formula based on SCr of 2.24 mg/dL (H)). Liver Function Tests:  Recent Labs Lab 09/01/16 1308 09/04/16 0416  AST 15  --   ALT 11*  --   ALKPHOS 105  --   BILITOT 0.2*  --   PROT 6.0*  --   ALBUMIN 2.9* 2.2*    Recent Labs Lab 09/01/16 1308  LIPASE 22   No results for input(s): AMMONIA in the last 168 hours. Coagulation Profile:  Recent Labs Lab 09/02/16 1652  INR 1.38   CBG:  Recent Labs Lab 09/05/16 0813 09/05/16 1213 09/05/16 1659 09/05/16 2143 09/06/16 0733  GLUCAP 153* 144* 190* 101* 75   Lipid Profile: No results for input(s): CHOL, HDL, LDLCALC, TRIG, CHOLHDL, LDLDIRECT in  the last 72 hours. Thyroid Function Tests:  Recent Labs  09/04/16 0416  TSH 3.677   Anemia Panel: No results for input(s): VITAMINB12, FOLATE, FERRITIN, TIBC, IRON, RETICCTPCT in the last 72 hours. Sepsis Labs:  Recent Labs Lab 09/01/16 1825 09/02/16 1652 09/02/16 2151 09/03/16 0330 09/04/16 0416  PROCALCITON 1.08  --   --   --  1.96  LATICACIDVEN 2.3* 3.5* 2.8* 1.2  --     Recent Results (from the past 240 hour(s))  Blood Culture (routine x 2)     Status: Abnormal   Collection Time: 09/01/16  1:08 PM  Result Value Ref Range Status   Specimen Description BLOOD LEFT WRIST  Final   Special Requests   Final    BOTTLES DRAWN AEROBIC ONLY Blood Culture adequate volume   Culture  Setup Time   Final    GRAM POSITIVE COCCI IN PAIRS AEROBIC BOTTLE ONLY CRITICAL RESULT CALLED TO, READ BACK BY AND VERIFIED WITH: N GLOGOVAC PHARMD 2153 09/02/16 A BROWNING    Culture (A)  Final    MICROCOCCUS LUTEUS/LYLAE THE SIGNIFICANCE OF ISOLATING THIS ORGANISM FROM A SINGLE SET OF BLOOD CULTURES WHEN MULTIPLE SETS ARE DRAWN IS UNCERTAIN. PLEASE NOTIFY THE MICROBIOLOGY DEPARTMENT WITHIN ONE WEEK IF SPECIATION AND SENSITIVITIES ARE REQUIRED. Performed at [facility], [place] [address], [place], [place] [address]    Report Status 09/03/2016 FINAL  Final  Blood Culture ID Panel (Reflexed)     Status: None   Collection Time: 09/01/16  1:08 PM  Result Value Ref Range Status   Enterococcus species NOT DETECTED NOT DETECTED Final   Listeria monocytogenes NOT DETECTED NOT DETECTED Final   Staphylococcus species NOT DETECTED NOT DETECTED Final   Staphylococcus aureus NOT  DETECTED NOT DETECTED Final   Streptococcus species NOT DETECTED NOT DETECTED Final   Streptococcus agalactiae NOT DETECTED NOT DETECTED Final   Streptococcus pneumoniae NOT DETECTED NOT DETECTED Final   Streptococcus pyogenes NOT DETECTED NOT DETECTED Final   Acinetobacter baumannii NOT DETECTED NOT DETECTED Final    Enterobacteriaceae species NOT DETECTED NOT DETECTED Final   Enterobacter cloacae complex NOT DETECTED NOT DETECTED Final   Escherichia coli NOT DETECTED NOT DETECTED Final   Klebsiella oxytoca NOT DETECTED NOT DETECTED Final   Klebsiella pneumoniae NOT DETECTED NOT DETECTED Final   Proteus species NOT DETECTED NOT DETECTED Final   Serratia marcescens NOT DETECTED NOT DETECTED Final   Haemophilus influenzae NOT DETECTED NOT DETECTED Final   Neisseria meningitidis NOT DETECTED NOT DETECTED Final   Pseudomonas aeruginosa NOT DETECTED NOT DETECTED Final   Candida albicans NOT DETECTED NOT DETECTED Final   Candida glabrata NOT DETECTED NOT DETECTED Final   Candida krusei NOT DETECTED NOT DETECTED Final   Candida parapsilosis NOT DETECTED NOT DETECTED Final   Candida tropicalis NOT DETECTED NOT DETECTED Final    Comment: Performed at [facility], [place] [address], [place], [place] [address]  Blood Culture (routine x 2)     Status: None (Preliminary result)   Collection Time: 09/01/16  2:28 PM  Result Value Ref Range Status   Specimen Description BLOOD RIGHT HAND  Final   Special Requests   Final    BOTTLES DRAWN AEROBIC AND ANAEROBIC Blood Culture adequate volume   Culture   Final    NO GROWTH 4 DAYS Performed at [facility], [place] [address] [place], [place] [address]    Report Status PENDING  Incomplete  MRSA PCR Screening     Status: None   Collection Time: 09/02/16  9:51 AM  Result Value Ref Range Status   MRSA by PCR NEGATIVE NEGATIVE Final    Comment:        The GeneXpert MRSA Assay (FDA approved for NASAL specimens only), is one component of a comprehensive MRSA colonization surveillance program. It is not intended to diagnose MRSA infection nor to guide or monitor treatment for MRSA infections.   Culture, blood (Routine X 2) w Reflex to ID Panel     Status: None (Preliminary result)   Collection Time: 09/03/16  2:35 PM  Result Value Ref Range Status    Specimen Description BLOOD LEFT ARM  Final   Special Requests IN PEDIATRIC BOTTLE Blood Culture adequate volume  Final   Culture   Final    NO GROWTH 2 DAYS Performed at [facility], [place] [address], [place], [place] [address]    Report Status PENDING  Incomplete  Culture, blood (Routine X 2) w Reflex to ID Panel     Status: None (Preliminary result)   Collection Time: 09/03/16  2:38 PM  Result Value Ref Range Status   Specimen Description BLOOD LEFT ARM  Final   Special Requests IN PEDIATRIC BOTTLE Blood Culture adequate volume  Final   Culture   Final    NO GROWTH 2 DAYS Performed at [facility], [place] [address] [place],  [address]    Report Status PENDING  Incomplete       Radiology Studies: Dg Chest Port 1 View  Result Date: 09/04/2016 CLINICAL DATA:  PICC line placement EXAM: PORTABLE CHEST 1 VIEW COMPARISON:  09/01/2016 FINDINGS: Stable cardiomegaly with aortic atherosclerosis. New left-sided PICC line tip terminates in the distal SVC.  New confluent airspace opacities are seen in the right lower lobe concerning for pneumonia. Apical pleuroparenchymal thickening and scarring seen at the right lung apex. No acute osseous abnormality. IMPRESSION: 1. PICC line tip in the distal SVC. 2. New confluent airspace opacity in the right lung base suspicious for pneumonia. 3. Cardiomegaly with aortic atherosclerosis. Electronically Signed   By: Ashley Royalty M.D.   On: 09/04/2016 21:11     Scheduled Meds: . atorvastatin  80 mg Oral QPC supper  . bisoprolol  2.5 mg Oral QHS  . brinzolamide  1 drop Both Eyes BID  . calcitRIOL  0.25 mcg Oral Q M,W,F  . citalopram  20 mg Oral QPC supper  . febuxostat  40 mg Oral Daily  . feeding supplement  1 Container Oral TID BM  . feeding supplement (ENSURE ENLIVE)  237 mL Oral BID BM  . ferrous gluconate  324 mg Oral QPC supper  . fluconazole  100 mg Oral Daily  . folic acid  1 mg Oral Daily  . hydrALAZINE  50 mg Oral BID  . insulin  aspart  0-9 Units Subcutaneous TID WC  . insulin glargine  12 Units Subcutaneous QAC breakfast  . isosorbide mononitrate  60 mg Oral QPC supper  . latanoprost  1 drop Both Eyes QHS  . levothyroxine  50 mcg Oral QPC supper  . LORazepam  1 mg Oral Once  . mirtazapine  30 mg Oral QHS  . multivitamin with minerals  1 tablet Oral Daily  . pantoprazole  40 mg Oral BID  . protein supplement  8 oz Oral BID   Continuous Infusions: . piperacillin-tazobactam (ZOSYN)  IV 2.25 g (09/06/16 0507)     LOS: 5 days    Chipper Oman, MD Pager: Text Page via [email or web address]  [phone]  If 7PM-7AM, please contact night-coverage [email or web address] Password [email or web address] 09/06/2016, 9:12 AM

## 2016-09-06 NOTE — Progress Notes (Signed)
Physical Therapy Treatment Patient Details Name: Erica Jimenez MRN: [id number] DOB: 08-08-1929 Today's Date: 09/06/2016    History of Present Illness Pt admitted wtih sepsis, possible HCAP and GIB.  Pt with hx of lung CA, DM, MI, CVA, COPD, CKD and CAD    PT Comments    Pt continues SOB with exertion but with marked improvement in activity tolerance.   Follow Up Recommendations  Home health PT;SNF;Supervision/Assistance - 24 hour     Equipment Recommendations  None recommended by PT    Recommendations for Other Services OT consult     Precautions / Restrictions Precautions Precautions: Fall Restrictions Weight Bearing Restrictions: No    Mobility  Bed Mobility Overal bed mobility: Modified Independent             General bed mobility comments: Pt self assisted to EOB with increased time  Transfers Overall transfer level: Needs assistance Equipment used: Rolling walker (2 wheeled) Transfers: Sit to/from Stand Sit to Stand: Min guard         General transfer comment: cues for use of UEs to self assist   Ambulation/Gait Ambulation/Gait assistance: Min guard Ambulation Distance (Feet): 100 Feet (twice) Assistive device: Rolling walker (2 wheeled) Gait Pattern/deviations: Step-through pattern;Decreased step length - right;Decreased step length - left;Shuffle;Trunk flexed Gait velocity: decreased Gait velocity interpretation: Below normal speed for age/gender General Gait Details: cues for posture and position from RW.  Increased time with several standing rest breaks 2* fatigue/SOB   Stairs            Wheelchair Mobility    Modified Rankin (Stroke Patients Only)       Balance Overall balance assessment: Needs assistance Sitting-balance support: No upper extremity supported;Feet supported Sitting balance-Leahy Scale: Good     Standing balance support: No upper extremity supported Standing balance-Leahy Scale: Fair                               Cognition Arousal/Alertness: Awake/alert Behavior During Therapy: WFL for tasks assessed/performed Overall Cognitive Status: Within Functional Limits for tasks assessed                                        Exercises      General Comments        Pertinent Vitals/Pain Pain Assessment: No/denies pain    Home Living                      Prior Function            PT Goals (current goals can now be found in the care plan section) Acute Rehab PT Goals Patient Stated Goal: to go home PT Goal Formulation: With patient Time For Goal Achievement: 09/19/16 Potential to Achieve Goals: Fair Progress towards PT goals: Progressing toward goals    Frequency    Min 3X/week      PT Plan Current plan remains appropriate    Co-evaluation              AM-PAC PT "6 Clicks" Daily Activity  Outcome Measure  Difficulty turning over in bed (including adjusting bedclothes, sheets and blankets)?: None Difficulty moving from lying on back to sitting on the side of the bed? : A Little Difficulty sitting down on and standing up from a chair with arms (e.g., wheelchair, bedside commode, etc,.)?:  A Little Help needed moving to and from a bed to chair (including a wheelchair)?: A Little Help needed walking in hospital room?: A Little Help needed climbing 3-5 steps with a railing? : A Little 6 Click Score: 19    End of Session Equipment Utilized During Treatment: Gait belt Activity Tolerance: Patient tolerated treatment well Patient left: in chair;with call bell/phone within reach;with chair alarm set;with nursing/sitter in room Nurse Communication: Mobility status PT Visit Diagnosis: Unsteadiness on feet (R26.81);Difficulty in walking, not elsewhere classified (R26.2);Muscle weakness (generalized) (M62.81)     Time: 1901-2224 PT Time Calculation (min) (ACUTE ONLY): 22 min  Charges:  $Gait Training: 8-22 mins                    G Codes:        Pg [phone]    Konstantin Lehnen 09/06/2016, 4:57 PM

## 2016-09-06 NOTE — Progress Notes (Signed)
Daily Rounding Note  09/06/2016, 8:21 AM  LOS: 5 days   SUBJECTIVE:   Chief complaint:  Appetite still depressed but nausea mostly resolved just occasional queasiness.  Small dark stool yesterday x 1.  Feels worn out but at peace with decision to stop chemo and move towards comfort care.     OBJECTIVE:         Vital signs in last 24 hours:    Temp:  [98.6 F (37 C)-99.2 F (37.3 C)] 98.8 F (37.1 C) (05/05 0522) Pulse Rate:  [37-103] 50 (05/05 0800) Resp:  [14-32] 19 (05/05 0800) BP: (84-165)/(33-96) 147/43 (05/05 0800) SpO2:  [86 %-97 %] 94 % (05/05 0800) Weight:  [69.7 kg (153 lb 10.6 oz)] 69.7 kg (153 lb 10.6 oz) (05/05 0459) Last BM Date: 09/05/16 Filed Weights   09/05/16 0411 09/05/16 0424 09/06/16 0459  Weight: 68.1 kg (150 lb 2.1 oz) 67.8 kg (149 lb 7.6 oz) 69.7 kg (153 lb 10.6 oz)   General: pleasant, difficulty speaking due to cough and SOB   Heart: RRR Chest: cough and dyspnea with speach Abdomen: soft, NT, ND.  Active BS.  Extremities: no CCE Neuro/Psych:  Oriented x 3.  Appropriate.  No gross deficits except ? Some work finding difficulty.   Intake/Output from previous day: 05/04 0701 - 05/05 0700 In: 1129.7 [I.V.:582.2; Blood:347.5; IV Piggyback:200] Out: -   Intake/Output this shift: No intake/output data recorded.  Lab Results:  Recent Labs  09/04/16 1758 09/05/16 0422 09/05/16 1354 09/06/16 0501  WBC 19.8* 15.8*  --  12.8*  HGB 7.8* 7.1* 8.9* 8.2*  HCT 23.1* 21.2* 26.8* 24.8*  PLT 75* 65*  --  68*   BMET  Recent Labs  09/04/16 0416 09/05/16 0422 09/06/16 0501  NA 146* 146* 146*  K 3.9 4.1 4.2  CL 125* 124* 125*  CO2 15* 16* 17*  GLUCOSE 79 140* 77  BUN 48* 37* 28*  CREATININE 2.38* 2.37* 2.24*  CALCIUM 7.4* 7.8* 7.8*   LFT  Recent Labs  09/04/16 0416  ALBUMIN 2.2*   PT/INR No results for input(s): LABPROT, INR in the last 72 hours. Hepatitis Panel No results for  input(s): HEPBSAG, HCVAB, HEPAIGM, HEPBIGM in the last 72 hours.  Studies/Results: Dg Chest Port 1 View  Result Date: 09/04/2016 CLINICAL DATA:  PICC line placement EXAM: PORTABLE CHEST 1 VIEW COMPARISON:  09/01/2016 FINDINGS: Stable cardiomegaly with aortic atherosclerosis. New left-sided PICC line tip terminates in the distal SVC. New confluent airspace opacities are seen in the right lower lobe concerning for pneumonia. Apical pleuroparenchymal thickening and scarring seen at the right lung apex. No acute osseous abnormality. IMPRESSION: 1. PICC line tip in the distal SVC. 2. New confluent airspace opacity in the right lung base suspicious for pneumonia. 3. Cardiomegaly with aortic atherosclerosis. Electronically Signed   By: Ashley Royalty M.D.   On: 09/04/2016 21:11   Scheduled Meds: . atorvastatin  80 mg Oral QPC supper  . bisoprolol  2.5 mg Oral QHS  . brinzolamide  1 drop Both Eyes BID  . calcitRIOL  0.25 mcg Oral Q M,W,F  . citalopram  20 mg Oral QPC supper  . febuxostat  40 mg Oral Daily  . feeding supplement  1 Container Oral TID BM  . feeding supplement (ENSURE ENLIVE)  237 mL Oral BID BM  . ferrous gluconate  324 mg Oral QPC supper  . fluconazole  100 mg Oral Daily  . folic acid  1 mg Oral Daily  . hydrALAZINE  50 mg Oral BID  . insulin aspart  0-9 Units Subcutaneous TID WC  . insulin glargine  12 Units Subcutaneous QAC breakfast  . isosorbide mononitrate  60 mg Oral QPC supper  . latanoprost  1 drop Both Eyes QHS  . levothyroxine  50 mcg Oral QPC supper  . LORazepam  1 mg Oral Once  . mirtazapine  30 mg Oral QHS  . multivitamin with minerals  1 tablet Oral Daily  . protein supplement  8 oz Oral BID   Continuous Infusions: . pantoprozole (PROTONIX) infusion 8 mg/hr (09/06/16 0600)  . piperacillin-tazobactam (ZOSYN)  IV 2.25 g (09/06/16 0507)   PRN Meds:.HYDROcodone-homatropine, morphine injection, prochlorperazine, senna, simethicone, zolpidem   ASSESMENT:   *  UGIB,  melena/hematochezia.  5/1 EGD: multiple duodenal ulcers, 1 bleeding with VV was injected with epi and clipped x 4.  Non-obstructing Schatski ring.  Non-bleeding ulcerative gastritis.  Monilial esophagitis.  PPI drip now in place beyond 72 hour standard.   *  ABL anemia.  Hgb stable.  s/p PRBC x 3, latest was x 1 on 5/4.    *  Thrombocytopenia.    *  Previous Plavix apparently for hx cad stent 1996. , now discontinued.   *  Stage 4 CKD  *  Stage 3 NSCLCa.  Wanting comfort care and chemo now discontinued per [place] discussion.   *  Sepsis due to HCA PNA.     PLAN   *  Regular but soft diet.  No need for strict glucose control in this end of life setting.  *  Switch to po PPI, BID.  Check serum H pylori (though ulcers likely due to chemo).  If H pylori +, would treat with abx/PPI.    *  GI will sign off.  Call if questions or reinvolvement.       Erica Jimenez  09/06/2016, 8:21 AM Pager: [phone]

## 2016-09-06 NOTE — Progress Notes (Signed)
Pharmacy Antibiotic Note  Erica Jimenez is a 83 y.o. female with history of lung cancer admitted on 09/01/2016 with sepsis 2/2 pneumonia. Patient placed on Zosyn for HCAP and Fluconazole for esophageal candidiasis with dosing assistance per pharmacy. Today, asked to transition patient from Zosyn to Augmentin to complete total 10 day course.   QTc on CV strip last night and this AM noted to be prolonged.   Plan: - D/C Zosyn - Augmentin 500/'125mg'$  PO q12h x 5 more days to complete total 10 day course. - Discussed QT prolongation with Dr. Quincy Simmonds Zapata-MD wishes to continue Fluconazole '100mg'$  PO daily to complete 7 day course, but has decreased Celexa dose and discontinued Compazine. Continue to monitor QTc very closely.  - Monitor renal function, cultures, clinical course.    Height: '5\' 6"'$  (167.6 cm) Weight: 153 lb 10.6 oz (69.7 kg) IBW/kg (Calculated) : 59.3  Temp (24hrs), Avg:98.7 F (37.1 C), Min:98.6 F (37 C), Max:98.9 F (37.2 C)   Recent Labs Lab 09/01/16 1308 09/01/16 1554 09/01/16 1825  09/02/16 1652 09/02/16 2151 09/03/16 0330 09/03/16 1435 09/04/16 0416 09/04/16 1758 09/05/16 0422 09/06/16 0501  WBC 27.0*  --   --   < > 29.8* 32.7* 24.3* 18.8* 19.1* 19.8* 15.8* 12.8*  CREATININE 2.30*  --   --   --   --   --  2.60*  --  2.38*  --  2.37* 2.24*  LATICACIDVEN  --  1.66 2.3*  --  3.5* 2.8* 1.2  --   --   --   --   --   < > = values in this interval not displayed.  Estimated Creatinine Clearance: 17.5 mL/min (A) (by C-G formula based on SCr of 2.24 mg/dL (H)).    Allergies  Allergen Reactions  . Contrast Media [Iodinated Diagnostic Agents] Other (See Comments)    Renal insufficiency  . Fluoxetine Rash  . Sulfonamide Derivatives Nausea Only    Antimicrobials this admission: 4/30 vanc >> 5/1 4/30 zosyn >> 5/5 5/2 fluconazole >> 5/5 augmentin >> (5/10)  Microbiology results: 4/30 BCx: GPC in pairs in aerobic bottle of 1 set only; nothing detected on BCID,  finalized as Micrococcus luteus/lylae--> likely contaminant  5/1 MRSA PCR: negative 5/2 Repeat BCx: NGTD  Thank you for allowing pharmacy to be a part of this patient's care.   Lindell Spar, PharmD, BCPS Pager: [phone] 09/06/2016 3:27 PM

## 2016-09-06 NOTE — Progress Notes (Signed)
Daily Progress Note   Patient Name: Erica Jimenez       Date: 09/06/2016 DOB: 05/23/1929  Age: 83 y.o. MRN#: [id number] Attending Physician: Patrecia Pour, Christean Grief, MD Primary Care Physician: [name] (Inactive) Admit Date: 09/01/2016  Reason for Consultation/Follow-up: Establishing goals of care  Subjective:  awake, in good spirits this am, resting in bed No family at bedside See below:  Length of Stay: 5  Current Medications: Scheduled Meds:  . atorvastatin  80 mg Oral QPC supper  . bisoprolol  2.5 mg Oral QHS  . brinzolamide  1 drop Both Eyes BID  . calcitRIOL  0.25 mcg Oral Q M,W,F  . citalopram  20 mg Oral QPC supper  . febuxostat  40 mg Oral Daily  . feeding supplement  1 Container Oral TID BM  . feeding supplement (ENSURE ENLIVE)  237 mL Oral BID BM  . ferrous gluconate  324 mg Oral QPC supper  . fluconazole  100 mg Oral Daily  . folic acid  1 mg Oral Daily  . hydrALAZINE  50 mg Oral BID  . insulin aspart  0-9 Units Subcutaneous TID WC  . insulin glargine  12 Units Subcutaneous QAC breakfast  . isosorbide mononitrate  60 mg Oral QPC supper  . latanoprost  1 drop Both Eyes QHS  . levothyroxine  50 mcg Oral QPC supper  . LORazepam  1 mg Oral Once  . mirtazapine  30 mg Oral QHS  . multivitamin with minerals  1 tablet Oral Daily  . pantoprazole  40 mg Oral BID  . protein supplement  8 oz Oral BID    Continuous Infusions: . piperacillin-tazobactam (ZOSYN)  IV Stopped (09/06/16 1245)    PRN Meds: HYDROcodone-homatropine, morphine injection, prochlorperazine, senna, simethicone, zolpidem  Physical Exam         Feels weak S1 S2 Clear Abdomen soft Trace edema Awake alert  Non focal  Vital Signs: BP (!) 166/33   Pulse 66   Temp 98.6 F (37 C) (Oral)    Resp 17   Ht '5\' 6"'$  (1.676 m)   Wt 69.7 kg (153 lb 10.6 oz)   SpO2 92%   BMI 24.80 kg/m  SpO2: SpO2: 92 % O2 Device: O2 Device: Not Delivered O2 Flow Rate: O2 Flow Rate (L/min): 3 L/min  Intake/output summary:  Intake/Output Summary (Last 24 hours) at 09/06/16 1243 Last data filed at 09/06/16 0600  Gross per 24 hour  Intake              700 ml  Output                0 ml  Net              700 ml   LBM: Last BM Date: 09/05/16 Baseline Weight: Weight: 63.5 kg (140 lb) Most recent weight: Weight: 69.7 kg (153 lb 10.6 oz)       Palliative Assessment/Data:    Flowsheet Rows     Most Recent Value  Intake Tab  Referral Department  Hospitalist  Unit at Time of Referral  Intermediate Care Unit  Palliative Care Primary Diagnosis  Cancer  Date Notified  09/05/16  Palliative Care Type  New Palliative care  Reason for referral  Clarify Goals of Care  Date of Admission  09/01/16  Date first seen by Palliative Care  09/05/16  # of days IP prior to Palliative referral  4  Clinical Assessment  Palliative Performance Scale Score  30%  Pain Max last 24 hours  4  Pain Min Last 24 hours  3  Dyspnea Max Last 24 Hours  4  Dyspnea Min Last 24 hours  3  Nausea Max Last 24 Hours  4  Nausea Min Last 24 Hours  3  Psychosocial & Spiritual Assessment  Palliative Care Outcomes  Patient/Family meeting held?  Yes  Who was at the meeting?  patient who is alert awake and decisional.   Palliative Care Outcomes  Provided advance care planning      Patient Active Problem List   Diagnosis Date Noted  . Bleeding acute gastric ulcer   . SOB (shortness of breath)   . Encounter for palliative care   . Malnutrition of moderate degree 09/02/2016  . Acute upper GI bleed   . Duodenal ulcer with hemorrhage   . Sepsis ([place]) 09/01/2016  . Goals of care, counseling/discussion 08/26/2016  . Acute cerebrovascular accident (CVA) ([place]) 08/16/2016  . Type 2 diabetes mellitus with hyperlipidemia ([place])  08/16/2016  . CKD (chronic kidney disease), stage IV ([place]) 08/16/2016  . Anemia 08/16/2016  . Stage IV squamous cell carcinoma of right lung ([place]) 08/14/2016  . Lung cancer ([place]) 08/12/2016  . Right lower lobe lung mass 07/21/2016  . Smoking 08/30/2010  . Aortic valve disorder 02/12/2009  . CARDIOMYOPATHY, ISCHEMIC 08/14/2008  . Hyperlipidemia 07/11/2008  . CAD, NATIVE VESSEL 07/11/2008  . BRADYCARDIA 07/11/2008  . Peripheral vascular disease ([place]) 07/11/2008  . CARDIAC MURMUR 07/11/2008  . CAROTID BRUIT 07/11/2008    Palliative Care Assessment & Plan   Patient Profile:    Assessment:  UGIB, multiple duodenal ulcers, monilial esophagitis Stage IV CKD Stage III NSCLC  Recommendations/Plan:    Continue current mode of care, monitor H&H, monitor PO intake, gentle treatments.   Disposition remains to be seen, recommend home with hospice, see below.   Code Status:    Code Status Orders        Start     Ordered   09/05/16 0855  Do not attempt resuscitation (DNR)  Continuous    Question Answer Comment  In the event of cardiac or respiratory ARREST Do not call a "code blue"   In the event of cardiac or respiratory ARREST Do not perform Intubation, CPR, defibrillation or ACLS   In the event of cardiac or  respiratory ARREST Use medication by any route, position, wound care, and other measures to relive pain and suffering. May use oxygen, suction and manual treatment of airway obstruction as needed for comfort.      09/05/16 0855    Code Status History    Date Active Date Inactive Code Status Order ID Comments User Context   09/01/2016  4:38 PM 09/05/2016  8:55 AM Full Code [id number]  Robbie Lis, MD Inpatient   08/16/2016 10:50 PM 08/18/2016  5:43 PM DNR [id number]  Toy Baker, MD Inpatient   08/17/2013  5:52 PM 08/17/2013 11:58 PM Full Code [id number]  Wellington Hampshire, MD Inpatient    Advance Directive Documentation     Most Recent Value  Type of Advance Directive   Healthcare Power of Attorney, Living will  Pre-existing out of facility DNR order (yellow form or pink MOST form)  -  "MOST" Form in Place?  -       Prognosis:   ?few weeks.   Discharge Planning:  Recommend home with hospice, as per patient's wishes, if daughter is able to take care of the patient at home.   Care plan was discussed with patient, bedside RN  Thank you for allowing the Palliative Medicine Team to assist in the care of this patient.   Time In: 11 Time Out: 11.25 Total Time 25 Prolonged Time Billed  no       Greater than 50%  of this time was spent counseling and coordinating care related to the above assessment and plan.  Loistine Chance, MD [phone]  Please contact Palliative Medicine Team phone at [phone] for questions and concerns.

## 2016-09-07 LAB — CBC WITH DIFFERENTIAL/PLATELET
BASOS PCT: 0 %
Basophils Absolute: 0 10*3/uL (ref 0.0–0.1)
EOS PCT: 1 %
Eosinophils Absolute: 0.1 10*3/uL (ref 0.0–0.7)
HEMATOCRIT: 25.5 % — AB (ref 36.0–46.0)
HEMOGLOBIN: 7.9 g/dL — AB (ref 12.0–15.0)
LYMPHS ABS: 1.4 10*3/uL (ref 0.7–4.0)
LYMPHS PCT: 10 %
MCH: 30.4 pg (ref 26.0–34.0)
MCHC: 31 g/dL (ref 30.0–36.0)
MCV: 98.1 fL (ref 78.0–100.0)
MONOS PCT: 8 %
Monocytes Absolute: 1.1 10*3/uL — ABNORMAL HIGH (ref 0.1–1.0)
NEUTROS ABS: 11.1 10*3/uL — AB (ref 1.7–7.7)
Neutrophils Relative %: 81 %
Platelets: 102 10*3/uL — ABNORMAL LOW (ref 150–400)
RBC: 2.6 MIL/uL — ABNORMAL LOW (ref 3.87–5.11)
RDW: 17.6 % — ABNORMAL HIGH (ref 11.5–15.5)
WBC: 13.7 10*3/uL — ABNORMAL HIGH (ref 4.0–10.5)

## 2016-09-07 LAB — GLUCOSE, CAPILLARY
GLUCOSE-CAPILLARY: 120 mg/dL — AB (ref 65–99)
Glucose-Capillary: 152 mg/dL — ABNORMAL HIGH (ref 65–99)
Glucose-Capillary: 207 mg/dL — ABNORMAL HIGH (ref 65–99)
Glucose-Capillary: 97 mg/dL (ref 65–99)
Glucose-Capillary: 55 mg/dL — ABNORMAL LOW (ref 65–99)
Glucose-Capillary: 70 mg/dL (ref 65–99)

## 2016-09-07 LAB — BASIC METABOLIC PANEL
Anion gap: 4 — ABNORMAL LOW (ref 5–15)
BUN: 26 mg/dL — AB (ref 6–20)
CHLORIDE: 124 mmol/L — AB (ref 101–111)
CO2: 17 mmol/L — AB (ref 22–32)
Calcium: 8.1 mg/dL — ABNORMAL LOW (ref 8.9–10.3)
Creatinine, Ser: 2.19 mg/dL — ABNORMAL HIGH (ref 0.44–1.00)
GFR calc Af Amer: 23 mL/min — ABNORMAL LOW (ref >60)
GFR calc non Af Amer: 19 mL/min — ABNORMAL LOW (ref >60)
GLUCOSE: 162 mg/dL — AB (ref 65–99)
POTASSIUM: 4.2 mmol/L (ref 3.5–5.1)
Sodium: 145 mmol/L (ref 135–145)

## 2016-09-07 LAB — PREPARE RBC (CROSSMATCH)

## 2016-09-07 MED ORDER — SODIUM CHLORIDE 0.9 % IV SOLN
Freq: Once | INTRAVENOUS | Status: AC
  Administered 2016-09-07: 18:00:00 via INTRAVENOUS

## 2016-09-07 MED ORDER — FUROSEMIDE 10 MG/ML IJ SOLN
20.0000 mg | Freq: Once | INTRAMUSCULAR | Status: AC
  Administered 2016-09-07: 20 mg via INTRAVENOUS
  Filled 2016-09-07: qty 2

## 2016-09-07 MED ORDER — SODIUM CHLORIDE 0.9% FLUSH
10.0000 mL | Freq: Two times a day (BID) | INTRAVENOUS | Status: DC
  Administered 2016-09-08: 10 mL

## 2016-09-07 NOTE — Progress Notes (Signed)
PROGRESS NOTE [facility]   Erica Jimenez   JHE:[id number] DOB: 10-Aug-1929  DOA: 09/01/2016 PCP: [name] (Inactive)   Brief Narrative:  83 year old female with medical history significant for lung cancer, diabetes mellitus, hypertension, chronic systolic heart failure who presented to the ED with ongoing cough and shortness of breath with exertion. She was admitted for suspected sepsis secondary to possible HCAP, and was started on broad-spectrum IV antibiotic. On the night of 4/30-5/1, she report to bloody bowel movement and coffee-ground emesis GI was consulted and emergent EGD was done which found esophageal candidiasis and bleeding ulcer which were hemostasis with epinephrine and clips. Patient also was given to 2 units of PRBC's. Patient treated with Protonix ggt for 72 hrs.   Subjective: Patient feel more weak, tire and fatigue today. Patient also report slight SOB. Hgb down today. No melena or bloody BM. Denies chest pain and hematemesis.   Assessment & Plan: Sepsis secondary to HCAP - sepsis physiology has resolved Checks x-ray shows right anterior lymphadenopathy and multifocal masses which is improving from last study, although patient with cough and general malaise and procalcitonin of 1.08 Patient initially started on vancomycin and Zosyn. Vancomycin discontinued on 09/02/2016 - MRSA negative Procalcitonin elevated although not reliable on CKD patients Patient received 6 days of Zosyn, white count continues to improve will change to Augmentin to complete total of 10 days of antibiotics. Blood cultures thus far NGTD  Continue current regimen   Acute blood loss anemia secondary to PUD in setting of ASA and Plavix  Status post 3 units of PRBCs during hospital stay - will add another unit given patient symptoms  Status post EGD with hemostasis - bleeding seems to have stopped at this point FOBT was positive Continue to hold aspirin and Plavix - patient may benefit  of discontinue Plavix completely Patient initially treated with PPI ggt, now oral PPI BID  Diet advance to soft per GI recommendations  Case discussed with IR again Dr Kathlene Cote - does not recommend embolization as patient is hemodynamically stable, if patient is having a slow bleed GI will best option at this point.  Continue Remeron for appetite stimulation Repeat CBC in AM   Esophageal candidiasis Continue current regimen x 7 days- Diflucan   Acute on chronic kidney disease stage IV - Improved Cr back to baseline Baseline creatinine is 2.3.  Lasix on hold, creatinine continues to improve and is at baseline.  Monitor BMP in AM   Paroxymal Afib - per patient she has had this in the past,  HR is well controlled  CHA2DS2VASc > 5, patient needs a/c but with recent bleed will hold on that. She can discuss further therapy as outpatient. On BB will continue to monitor   Essential hypertension - stable  Continue current regimen   Coronary artery disease, history of CVA Will likely to discontinue Plavix indefinitely, keep holding aspirin for now Asymptomatic Continue to monitor  Chronic systolic CHF  Seems to be compensated Continue current regimen: home medication, except Lasix which is on hold due to increasing creatinine Continue to monitor   Diabetes mellitus type 2 - CBG's stable  Continue current regimen  Lantus and SSI Monitor CBGs  Stage III non-small cell lung cancer  Diagnosis in March 2018 - patient reports to me that she does not wants to continue chemotherapy Case discussed with Dr. [name], he will discuss prognosis and further plan as outpatient.   Goals of care  Likely home with hospice   DVT  prophylaxis: SCD's  Code Status: FULL  Family Communication: None at bedside  Disposition Plan: Home with hospice.   Consultants:   GI   Procedures:   EGD done on May 1 of 2018  Antimicrobials: Anti-infectives    Start     Dose/Rate Route Frequency Ordered Stop     09/06/16 1800  amoxicillin-clavulanate (AUGMENTIN) 500-125 MG per tablet 500 mg  Status:  Discontinued     1 tablet Oral Every 12 hours 09/06/16 1518 09/06/16 1522   09/06/16 1800  amoxicillin-clavulanate (AUGMENTIN) 500-125 MG per tablet 500 mg     1 tablet Oral Every 12 hours 09/06/16 1522 09/11/16 2159   09/06/16 1000  fluconazole (DIFLUCAN) tablet 100 mg     100 mg Oral Daily 09/05/16 1321 09/10/16 0959   09/04/16 1000  fluconazole (DIFLUCAN) IVPB 100 mg  Status:  Discontinued     100 mg 50 mL/hr over 60 Minutes Intravenous Every 24 hours 09/03/16 0844 09/05/16 1321   09/03/16 0900  fluconazole (DIFLUCAN) IVPB 200 mg     200 mg 100 mL/hr over 60 Minutes Intravenous  Once 09/03/16 0842 09/03/16 1038   09/01/16 1515  piperacillin-tazobactam (ZOSYN) IVPB 3.375 g  Status:  Discontinued     3.375 g 100 mL/hr over 30 Minutes Intravenous  Once 09/01/16 1502 09/01/16 1504   09/01/16 1515  vancomycin (VANCOCIN) IVPB 1000 mg/200 mL premix  Status:  Discontinued     1,000 mg 200 mL/hr over 60 Minutes Intravenous  Once 09/01/16 1502 09/01/16 1505   09/01/16 1415  piperacillin-tazobactam (ZOSYN) IVPB 2.25 g  Status:  Discontinued     2.25 g 100 mL/hr over 30 Minutes Intravenous Every 6 hours 09/01/16 1400 09/06/16 1515   09/01/16 1400  piperacillin-tazobactam (ZOSYN) IVPB 3.375 g  Status:  Discontinued     3.375 g 100 mL/hr over 30 Minutes Intravenous  Once 09/01/16 1355 09/01/16 1400   09/01/16 1400  vancomycin (VANCOCIN) IVPB 1000 mg/200 mL premix     1,000 mg 200 mL/hr over 60 Minutes Intravenous  Once 09/01/16 1355 09/01/16 1516       Objective: Vitals:   09/06/16 1500 09/06/16 2117 09/07/16 0526 09/07/16 1355  BP: (!) 140/56 (!) 151/76 (!) 147/74 (!) 148/90  Pulse: (!) 51 (!) 118 83 (!) 48  Resp: 20 20 (!) 22 16  Temp: 99 F (37.2 C) 99.7 F (37.6 C) 98.4 F (36.9 C) 99.7 F (37.6 C)  TempSrc: Oral Oral Oral Oral  SpO2: 93% 94% 93% 98%  Weight:   70.1 kg (154 lb 8.7 oz)    Height:        Intake/Output Summary (Last 24 hours) at 09/07/16 1740 Last data filed at 09/07/16 0200  Gross per 24 hour  Intake              720 ml  Output                0 ml  Net              720 ml   Filed Weights   09/05/16 0424 09/06/16 0459 09/07/16 0526  Weight: 67.8 kg (149 lb 7.6 oz) 69.7 kg (153 lb 10.6 oz) 70.1 kg (154 lb 8.7 oz)    Examination:   General exam: Comfortable Eyes: Conjunctiva Pale, PERRL  Respiratory system: CTA  Cardiovascular system: S1S2 no murmurs  Extremities: No LE edema  Skin: Pale  Psychiatry: Mood appropriate   Data Reviewed: I have personally reviewed following labs  and imaging studies  CBC:  Recent Labs Lab 09/01/16 1308  09/04/16 0416 09/04/16 1758 09/05/16 0422 09/05/16 1354 09/06/16 0501 09/07/16 0510  WBC 27.0*  < > 19.1* 19.8* 15.8*  --  12.8* 13.7*  NEUTROABS 21.3*  --   --   --   --   --   --  11.1*  HGB 10.3*  < > 7.5* 7.8* 7.1* 8.9* 8.2* 7.9*  HCT 31.8*  < > 22.0* 23.1* 21.2* 26.8* 24.8* 25.5*  MCV 90.1  < > 89.4 90.9 91.0  --  92.2 98.1  PLT 152  < > 70* 75* 65*  --  68* 102*  < > = values in this interval not displayed. Basic Metabolic Panel:  Recent Labs Lab 09/03/16 0330 09/04/16 0416 09/05/16 0422 09/06/16 0501 09/07/16 0510  NA 140 146* 146* 146* 145  K 4.4 3.9 4.1 4.2 4.2  CL 116* 125* 124* 125* 124*  CO2 16* 15* 16* 17* 17*  GLUCOSE 197* 79 140* 77 162*  BUN 68* 48* 37* 28* 26*  CREATININE 2.60* 2.38* 2.37* 2.24* 2.19*  CALCIUM 7.3* 7.4* 7.8* 7.8* 8.1*  MG  --   --   --  1.8  --    GFR: Estimated Creatinine Clearance: 17.9 mL/min (A) (by C-G formula based on SCr of 2.19 mg/dL (H)). Liver Function Tests:  Recent Labs Lab 09/01/16 1308 09/04/16 0416  AST 15  --   ALT 11*  --   ALKPHOS 105  --   BILITOT 0.2*  --   PROT 6.0*  --   ALBUMIN 2.9* 2.2*    Recent Labs Lab 09/01/16 1308  LIPASE 22   No results for input(s): AMMONIA in the last 168 hours. Coagulation Profile:  Recent  Labs Lab 09/02/16 1652  INR 1.38   CBG:  Recent Labs Lab 09/06/16 1701 09/06/16 2115 09/07/16 0742 09/07/16 1129 09/07/16 1633  GLUCAP 79 197* 152* 207* 97   Lipid Profile: No results for input(s): CHOL, HDL, LDLCALC, TRIG, CHOLHDL, LDLDIRECT in the last 72 hours. Thyroid Function Tests: No results for input(s): TSH, T4TOTAL, FREET4, T3FREE, THYROIDAB in the last 72 hours. Anemia Panel:  Recent Labs  09/06/16 0800  VITAMINB12 2,382*  FOLATE 58.8  FERRITIN 538*  TIBC 164*  IRON 32  RETICCTPCT 3.5*   Sepsis Labs:  Recent Labs Lab 09/01/16 1825 09/02/16 1652 09/02/16 2151 09/03/16 0330 09/04/16 0416  PROCALCITON 1.08  --   --   --  1.96  LATICACIDVEN 2.3* 3.5* 2.8* 1.2  --     Recent Results (from the past 240 hour(s))  Blood Culture (routine x 2)     Status: Abnormal   Collection Time: 09/01/16  1:08 PM  Result Value Ref Range Status   Specimen Description BLOOD LEFT WRIST  Final   Special Requests   Final    BOTTLES DRAWN AEROBIC ONLY Blood Culture adequate volume   Culture  Setup Time   Final    GRAM POSITIVE COCCI IN PAIRS AEROBIC BOTTLE ONLY CRITICAL RESULT CALLED TO, READ BACK BY AND VERIFIED WITH: N GLOGOVAC PHARMD 2153 09/02/16 A BROWNING    Culture (A)  Final    MICROCOCCUS LUTEUS/LYLAE THE SIGNIFICANCE OF ISOLATING THIS ORGANISM FROM A SINGLE SET OF BLOOD CULTURES WHEN MULTIPLE SETS ARE DRAWN IS UNCERTAIN. PLEASE NOTIFY THE MICROBIOLOGY DEPARTMENT WITHIN ONE WEEK IF SPECIATION AND SENSITIVITIES ARE REQUIRED. Performed at [facility], [place] [address], [place], [place] [address]    Report Status 09/03/2016  FINAL  Final  Blood Culture ID Panel (Reflexed)     Status: None   Collection Time: 09/01/16  1:08 PM  Result Value Ref Range Status   Enterococcus species NOT DETECTED NOT DETECTED Final   Listeria monocytogenes NOT DETECTED NOT DETECTED Final   Staphylococcus species NOT DETECTED NOT DETECTED Final   Staphylococcus aureus NOT DETECTED  NOT DETECTED Final   Streptococcus species NOT DETECTED NOT DETECTED Final   Streptococcus agalactiae NOT DETECTED NOT DETECTED Final   Streptococcus pneumoniae NOT DETECTED NOT DETECTED Final   Streptococcus pyogenes NOT DETECTED NOT DETECTED Final   Acinetobacter baumannii NOT DETECTED NOT DETECTED Final   Enterobacteriaceae species NOT DETECTED NOT DETECTED Final   Enterobacter cloacae complex NOT DETECTED NOT DETECTED Final   Escherichia coli NOT DETECTED NOT DETECTED Final   Klebsiella oxytoca NOT DETECTED NOT DETECTED Final   Klebsiella pneumoniae NOT DETECTED NOT DETECTED Final   Proteus species NOT DETECTED NOT DETECTED Final   Serratia marcescens NOT DETECTED NOT DETECTED Final   Haemophilus influenzae NOT DETECTED NOT DETECTED Final   Neisseria meningitidis NOT DETECTED NOT DETECTED Final   Pseudomonas aeruginosa NOT DETECTED NOT DETECTED Final   Candida albicans NOT DETECTED NOT DETECTED Final   Candida glabrata NOT DETECTED NOT DETECTED Final   Candida krusei NOT DETECTED NOT DETECTED Final   Candida parapsilosis NOT DETECTED NOT DETECTED Final   Candida tropicalis NOT DETECTED NOT DETECTED Final    Comment: Performed at [facility], [address], [place], [place] [address]  Blood Culture (routine x 2)     Status: None   Collection Time: 09/01/16  2:28 PM  Result Value Ref Range Status   Specimen Description BLOOD RIGHT HAND  Final   Special Requests   Final    BOTTLES DRAWN AEROBIC AND ANAEROBIC Blood Culture adequate volume   Culture   Final    NO GROWTH 5 DAYS Performed at [facility], [place] [address] [place], [place] [address]    Report Status 09/06/2016 FINAL  Final  MRSA PCR Screening     Status: None   Collection Time: 09/02/16  9:51 AM  Result Value Ref Range Status   MRSA by PCR NEGATIVE NEGATIVE Final    Comment:        The GeneXpert MRSA Assay (FDA approved for NASAL specimens only), is one component of a comprehensive MRSA  colonization surveillance program. It is not intended to diagnose MRSA infection nor to guide or monitor treatment for MRSA infections.   Culture, blood (Routine X 2) w Reflex to ID Panel     Status: None (Preliminary result)   Collection Time: 09/03/16  2:35 PM  Result Value Ref Range Status   Specimen Description BLOOD LEFT ARM  Final   Special Requests IN PEDIATRIC BOTTLE Blood Culture adequate volume  Final   Culture   Final    NO GROWTH 4 DAYS Performed at [facility], [address], [place],  [address]    Report Status PENDING  Incomplete  Culture, blood (Routine X 2) w Reflex to ID Panel     Status: None (Preliminary result)   Collection Time: 09/03/16  2:38 PM  Result Value Ref Range Status   Specimen Description BLOOD LEFT ARM  Final   Special Requests IN PEDIATRIC BOTTLE Blood Culture adequate volume  Final   Culture   Final    NO GROWTH 4 DAYS Performed at [facility], [place]  [address] [place], [place] [address]    Report Status PENDING  Incomplete       Radiology Studies: No results found.   Scheduled Meds: . amoxicillin-clavulanate  1 tablet Oral Q12H  . atorvastatin  80 mg Oral QPC supper  . bisoprolol  2.5 mg Oral QHS  . brinzolamide  1 drop Both Eyes BID  . calcitRIOL  0.25 mcg Oral Q M,W,F  . citalopram  10 mg Oral QPC supper  . febuxostat  40 mg Oral Daily  . feeding supplement  1 Container Oral TID BM  . feeding supplement (ENSURE ENLIVE)  237 mL Oral BID BM  . ferrous gluconate  324 mg Oral QPC supper  . fluconazole  100 mg Oral Daily  . folic acid  1 mg Oral Daily  . furosemide  20 mg Intravenous Once  . hydrALAZINE  50 mg Oral BID  . insulin aspart  0-9 Units Subcutaneous TID WC  . insulin glargine  12 Units Subcutaneous QAC breakfast  . isosorbide mononitrate  60 mg Oral QPC supper  . latanoprost  1 drop Both Eyes QHS  . levothyroxine  50 mcg Oral QPC supper  . LORazepam  1 mg Oral Once  . mirtazapine  30 mg Oral QHS   . multivitamin with minerals  1 tablet Oral Daily  . pantoprazole  40 mg Oral BID  . protein supplement  8 oz Oral BID  . sodium chloride flush  10-40 mL Intracatheter Q12H   Continuous Infusions: . sodium chloride       LOS: 6 days    Chipper Oman, MD Pager: Text Page via [email or web address]  [phone]  If 7PM-7AM, please contact night-coverage [email or web address] Password [email or web address] 09/07/2016, 5:40 PM

## 2016-09-08 ENCOUNTER — Encounter: Payer: Medicare Other | Admitting: Cardiovascular Disease

## 2016-09-08 LAB — TYPE AND SCREEN
ABO/RH(D): A POS
ANTIBODY SCREEN: NEGATIVE
UNIT DIVISION: 0

## 2016-09-08 LAB — BASIC METABOLIC PANEL
ANION GAP: 3 — AB (ref 5–15)
BUN: 19 mg/dL (ref 6–20)
CALCIUM: 8.1 mg/dL — AB (ref 8.9–10.3)
CO2: 17 mmol/L — ABNORMAL LOW (ref 22–32)
Chloride: 123 mmol/L — ABNORMAL HIGH (ref 101–111)
Creatinine, Ser: 2.03 mg/dL — ABNORMAL HIGH (ref 0.44–1.00)
GFR, EST AFRICAN AMERICAN: 25 mL/min — AB (ref >60)
GFR, EST NON AFRICAN AMERICAN: 21 mL/min — AB (ref >60)
Glucose, Bld: 201 mg/dL — ABNORMAL HIGH (ref 65–99)
Potassium: 4.4 mmol/L (ref 3.5–5.1)
SODIUM: 143 mmol/L (ref 135–145)

## 2016-09-08 LAB — CBC
HCT: 29.8 % — ABNORMAL LOW (ref 36.0–46.0)
HEMOGLOBIN: 9.5 g/dL — AB (ref 12.0–15.0)
MCH: 29.9 pg (ref 26.0–34.0)
MCHC: 31.9 g/dL (ref 30.0–36.0)
MCV: 93.7 fL (ref 78.0–100.0)
PLATELETS: 128 10*3/uL — AB (ref 150–400)
RBC: 3.18 MIL/uL — AB (ref 3.87–5.11)
RDW: 18 % — ABNORMAL HIGH (ref 11.5–15.5)
WBC: 13.3 10*3/uL — ABNORMAL HIGH (ref 4.0–10.5)

## 2016-09-08 LAB — GLUCOSE, CAPILLARY
GLUCOSE-CAPILLARY: 129 mg/dL — AB (ref 65–99)
GLUCOSE-CAPILLARY: 169 mg/dL — AB (ref 65–99)

## 2016-09-08 LAB — BPAM RBC
BLOOD PRODUCT EXPIRATION DATE: 201805232359
ISSUE DATE / TIME: 201805061740
UNIT TYPE AND RH: 6200

## 2016-09-08 LAB — HEMOGLOBIN AND HEMATOCRIT, BLOOD
HEMATOCRIT: 28.9 % — AB (ref 36.0–46.0)
Hemoglobin: 9.5 g/dL — ABNORMAL LOW (ref 12.0–15.0)

## 2016-09-08 LAB — CULTURE, BLOOD (ROUTINE X 2)
CULTURE: NO GROWTH
CULTURE: NO GROWTH
SPECIAL REQUESTS: ADEQUATE
Special Requests: ADEQUATE

## 2016-09-08 MED ORDER — HYDROCODONE-HOMATROPINE 5-1.5 MG/5ML PO SYRP: 2.5000 mL | ORAL_SOLUTION | ORAL | 0 refills | Status: DC | PRN

## 2016-09-08 MED ORDER — MIRTAZAPINE 30 MG PO TABS: 30.0000 mg | ORAL_TABLET | Freq: Every day | ORAL | 0 refills | Status: DC

## 2016-09-08 MED ORDER — ENSURE ENLIVE PO LIQD: 237.0000 mL | Freq: Two times a day (BID) | ORAL | 12 refills | Status: DC

## 2016-09-08 MED ORDER — AMOXICILLIN-POT CLAVULANATE 500-125 MG PO TABS: 1.0000 | ORAL_TABLET | Freq: Two times a day (BID) | ORAL | 0 refills | Status: AC

## 2016-09-08 MED ORDER — FLUCONAZOLE 100 MG PO TABS: 100.0000 mg | ORAL_TABLET | Freq: Every day | ORAL | 0 refills | Status: AC

## 2016-09-08 MED ORDER — PANTOPRAZOLE SODIUM 40 MG PO TBEC: 40.0000 mg | DELAYED_RELEASE_TABLET | Freq: Two times a day (BID) | ORAL | 0 refills | Status: DC

## 2016-09-08 MED ORDER — CITALOPRAM HYDROBROMIDE 10 MG PO TABS: 20.0000 mg | ORAL_TABLET | Freq: Every day | ORAL | 0 refills | Status: DC

## 2016-09-08 MED ORDER — BOOST / RESOURCE BREEZE PO LIQD: 1.0000 | Freq: Three times a day (TID) | ORAL | 12 refills | Status: DC

## 2016-09-08 MED ORDER — IPRATROPIUM-ALBUTEROL 0.5-2.5 (3) MG/3ML IN SOLN
3.0000 mL | Freq: Four times a day (QID) | RESPIRATORY_TRACT | Status: DC | PRN
  Administered 2016-09-08: 3 mL via RESPIRATORY_TRACT
  Filled 2016-09-08: qty 3

## 2016-09-08 NOTE — Progress Notes (Signed)
Physical Therapy Treatment Patient Details Name: Erica Jimenez MRN: [id number] DOB: 06/25/1929 Today's Date: 09/08/2016    History of Present Illness Pt admitted wtih sepsis, possible HCAP and GIB.  Pt with hx of lung CA, DM, MI, CVA, COPD, CKD and CAD    PT Comments    Assisted OOB to amb to bathroom pt demonstrated 3/4 DOE.  Assisted with hygiene then amb to edge of bed as pt requested to sit and rest before attempting amb in hallway.  Sats avg 96%.  Assisted with amb in hallway with walker for increased stability and energy conservation.    Follow Up Recommendations  Home health PT;SNF;Supervision/Assistance - 24 hour  no family present Assume pt is returning to home with daughter   Equipment Recommendations  None recommended by PT    Recommendations for Other Services       Precautions / Restrictions Precautions Precautions: Fall Restrictions Weight Bearing Restrictions: No    Mobility  Bed Mobility Overal bed mobility: Needs Assistance Bed Mobility: Supine to Sit     Supine to sit: Supervision;Min guard     General bed mobility comments: increased time  Transfers Overall transfer level: Needs assistance Equipment used: Rolling walker (2 wheeled);None Transfers: Sit to/from [employer] to Stand: Min guard;Supervision Stand pivot transfers: Min guard;Supervision       General transfer comment: cues for use of UEs to self assist   Ambulation/Gait Ambulation/Gait assistance: Min guard;Supervision Ambulation Distance (Feet): 85 Feet Assistive device: Rolling walker (2 wheeled);None Gait Pattern/deviations: Step-to pattern;Step-through pattern;Decreased step length - right;Decreased step length - left;Trunk flexed;Shuffle Gait velocity: decreased   General Gait Details: pt stated she does not use a walker in the house and admits to furniture walking.  Amb from bed to bathroom no AD pt did hold to bed/doorfram/sink.  Amb with walker in  hallway with improved stability/balance/safety.  Limited amb distance due to fatigue.  Noted 4/4 dyspnea but RA 97%.  Required 3 sitting rest breaks.     Stairs            Wheelchair Mobility    Modified Rankin (Stroke Patients Only)       Balance                                            Cognition Arousal/Alertness: Awake/alert Behavior During Therapy: WFL for tasks assessed/performed Overall Cognitive Status: Within Functional Limits for tasks assessed                                        Exercises      General Comments        Pertinent Vitals/Pain Pain Assessment: No/denies pain    Home Living                      Prior Function            PT Goals (current goals can now be found in the care plan section) Progress towards PT goals: Progressing toward goals    Frequency    Min 3X/week      PT Plan Current plan remains appropriate    Co-evaluation              AM-PAC PT "6 Clicks" Daily Activity  Outcome Measure  Difficulty turning over in bed (including adjusting bedclothes, sheets and blankets)?: None Difficulty moving from lying on back to sitting on the side of the bed? : A Little Difficulty sitting down on and standing up from a chair with arms (e.g., wheelchair, bedside commode, etc,.)?: A Little Help needed moving to and from a bed to chair (including a wheelchair)?: A Little Help needed walking in hospital room?: A Little Help needed climbing 3-5 steps with a railing? : A Little 6 Click Score: 19    End of Session Equipment Utilized During Treatment: Gait belt Activity Tolerance: Patient limited by fatigue Patient left: in chair;with call bell/phone within reach;with chair alarm set;with nursing/sitter in room Nurse Communication: Mobility status PT Visit Diagnosis: Unsteadiness on feet (R26.81);Difficulty in walking, not elsewhere classified (R26.2);Muscle weakness (generalized)  (M62.81)     Time: 0600-4599 PT Time Calculation (min) (ACUTE ONLY): 25 min  Charges:  $Gait Training: 8-22 mins $Therapeutic Activity: 8-22 mins                    G Codes:       Rica Koyanagi  PTA [facility]  Acute  Rehab Pager      [phone]

## 2016-09-08 NOTE — Care Management Note (Signed)
Case Management Note  Patient Details  Name: Erica Jimenez MRN: [id number] Date of Birth: 04/16/1930  Subjective/Objective: Patient/dtr Erica-chose [facility] referral line-[phone]-[id number] will contact the liason-contact person-Erica Jimenez(dtr) [phone]-I have provided this info to referral line-aware of d/c, no dme needed,pcp to follow in community-Dr. Bevelyn Buckles. Dtr will transport home.Signed  DNR form on shadow chart. No further Cm needs.                    Action/Plan:d/c home w/hospice-[facility].   Expected Discharge Date:   (unknown)               Expected Discharge Plan:  Home w Hospice Care  In-House Referral:     Discharge planning Services  CM Consult  Post Acute Care Choice:    Choice offered to:  Patient  DME Arranged:    DME Agency:     HH Arranged:    [place] Agency:     Status of Service:  Completed, signed off  If discussed at [employer] of Stay Meetings, dates discussed:    Additional Comments:  Dessa Phi, RN 09/08/2016, 3:24 PM

## 2016-09-08 NOTE — Progress Notes (Signed)
[facility] - RN Visit  [facility] notified by Dessa Phi [facility] of patient/family request for Hosp[facility] and [place] services at home after discharge.  Chart and patient information reviewed with Dr. Alferd Patee, [place] Director, and hospice eligibility confirmed.  Spoke with patient and daughter [name] at bedside to initiate education related to hospice philosophy, services and team approach to care. Patient voiced understanding of information provided stating her " husband was under hospice care for over six months at home". Per discussion, plan is for discharge to home by personal vehicle this afternoon.    Please send signed completed DNR form home with patient. Patient will need prescriptions for discharge comfort medications.  DME needs discussed and patient currently has a walker, 3 prong cane, shower chair, and BSC in the home. Patient states that she has no further equipment needs in the home at this time.     [facility] Referral center aware of the above and [facility] updated on patient.  Completed discharge summary will need to be faxed to [facility] at [phone] when final. Please notify [facility] when patient is ready to leave unit at discharge - call [phone] (or [phone] after 5pm). [facility] information have been given to patient during visit.   Please call with any questions,  Gar Ponto, [facility] Liaison [phone]

## 2016-09-08 NOTE — Care Management Note (Signed)
Case Management Note  Patient Details  Name: Erica Jimenez MRN: [id number] Date of Birth: 08-17-1929  Subjective/Objective: 83 y/o f admitted w/Sepsis. Hx: Lung Ca. Palliative Medical Team cons-recc Home w/hospice. DNR. Referral for home hospice choice-spoke to patient/dtr(-Peggy [phone] on phone) about home hospice provider list-dtr will choose once she is @ hospital-await choice. Patient lives w/dtr, Dr. [name] oncologist.                    Action/Plan:d/c home w/hospice.   Expected Discharge Date:   (unknown)               Expected Discharge Plan:  Home w Hospice Care  In-House Referral:     Discharge planning Services  CM Consult  Post Acute Care Choice:    Choice offered to:     DME Arranged:    DME Agency:     HH Arranged:    HH Agency:     Status of Service:  In process, will continue to follow  If discussed at Long Length of Stay Meetings, dates discussed:    Additional Comments:  Dessa Phi, RN 09/08/2016, 11:27 AM

## 2016-09-08 NOTE — Discharge Summary (Signed)
Physician Discharge Summary  Erica Jimenez  WJX:[id number]  DOB: 28-May-1929  DOA: 09/01/2016 PCP: [name] (Inactive)  Admit date: 09/01/2016 Discharge date: 09/08/2016  Admitted From: Home  Disposition:  Home with hospice   Recommendations for Outpatient Follow-up:  1. Follow up with PCP in 1-2 weeks 2. Please obtain BMP/CBC in one week 3. Please follow up on the following pending results:  Home Health: Hospice  Equipment/Devices: Walker   Discharge Condition: Improved - Hospice care  CODE STATUS: DNR  Diet recommendation:  Regular   Brief/Interim Summary: 83 year old female with medical history significant for lung cancer, diabetes mellitus, hypertension, chronic systolic heart failure who presented to the ED with ongoing cough and shortness of breath with exertion. She was admitted for suspected sepsis secondary to possible HCAP, and was started on broad-spectrum IV antibiotic. On the night of 4/30-5/1, she report to bloody bowel movement and coffee-ground emesis GI was consulted and emergent EGD was done which found esophageal candidiasis and bleeding ulcer which were hemostasis with epinephrine and clips. Patient also was given total of 4 units of PRBC's during hospital. Patient treated with Protonix ggt for 72 hrs. Patient subsequently improved and decided to proceed with hospice care as her whishes is not to continue with chemotherapy. Patient will be discharge home to complete course of antibiotics.   Subjective: Patient seen and examined, doing much better, sitting up in chair, more energetic. No acute events overnight. Remains afebrile and clinically improving.   Discharge Diagnoses/Hospital Course:  Sepsis secondary to HCAP - sepsis physiology has resolved Checks x-ray shows right anterior lymphadenopathy and multifocal masses which is improving from last study, although patient with cough and general malaise and procalcitonin of 1.08 Patient initially started on  vancomycin and Zosyn. Vancomycin discontinued on 09/02/2016 - MRSA negative Procalcitonin elevated although not reliable on CKD patients Patient received 6 days of Zosyn, white count was improved will change to Augmentin to complete total of 10 days of antibiotics. Blood cultures thus far NGTD   Acute blood loss anemia secondary to PUD in setting of ASA and Plavix  Status post 4 units of PRBCs during hospital stay Status post EGD with hemostasis - bleeding seems to have stopped at this point FOBT was positive Aspirin and Plavix were held - Plavix discontinued indefinitely, can resume ASA  Patient initially treated with PPI ggt, now oral PPI BID   Case discussed with IR again Dr Kathlene Cote - does not recommend embolization as patient is hemodynamically stable  Follow up with GI in 2 -4 weeks  Repeat CBC in 1 week   Esophageal candidiasis Diflucan x 7 days   Acute on chronic kidney disease stage IV - Improved Cr back to baseline Check BMP in 1 week   Paroxymal Afib - per patient she has had this in the past,  HR is well controlled  CHA2DS2VASc > 5, patient needs a/c but with recent bleed will hold on that. She can discuss further therapy as outpatient. - Patient going for hospice care, would not recommend oral a/c at this moment.  On BB, monitor as outpatient   Essential hypertension - BP was stable during hospital stay  No changes in medications were made  Coronary artery disease, history of CVA Plavix will be discontinued given bleeding risk Can resume ASA   Asymptomatic Follow up with PCP   Chronic systolic CHF  Seems to be compensated Resume home medications with no changes in doses  Continue to monitor   Diabetes mellitus type  2 - CBG's stable  Home medications to be resume with no changes in doses  Check CBG's daily goal <140 fasting   Stage III non-small cell lung cancer  Diagnosis in March 2018 - patient reports to me that she does not wants to continue  chemotherapy Case discussed with Dr. [name], he will discuss prognosis and further plan as outpatient.   Moderate malnutrition due to chronic illness  Continue Remeron dose was increase during hospital stay  Continue Protein Shakes    All other chronic medical condition were stable during the hospitalization.  On the day of the discharge the patient's vitals were stable, and no other acute medical condition were reported by patient. Patient was felt safe to be discharge to home with daughter and hospices services.   Discharge Instructions  You were cared for by a hospitalist during your hospital stay. If you have any questions about your discharge medications or the care you received while you were in the hospital after you are discharged, you can call the unit and asked to speak with the hospitalist on call if the hospitalist that took care of you is not available. Once you are discharged, your primary care physician will handle any further medical issues. Please note that NO REFILLS for any discharge medications will be authorized once you are discharged, as it is imperative that you return to your primary care physician (or establish a relationship with a primary care physician if you do not have one) for your aftercare needs so that they can reassess your need for medications and monitor your lab values.  Discharge Instructions    Call MD for:  difficulty breathing, headache or visual disturbances    Complete by:  As directed    Call MD for:  extreme fatigue    Complete by:  As directed    Call MD for:  hives    Complete by:  As directed    Call MD for:  persistant dizziness or light-headedness    Complete by:  As directed    Call MD for:  persistant nausea and vomiting    Complete by:  As directed    Call MD for:  redness, tenderness, or signs of infection (pain, swelling, redness, odor or green/yellow discharge around incision site)    Complete by:  As directed    Call MD for:   severe uncontrolled pain    Complete by:  As directed    Call MD for:  temperature >100.4    Complete by:  As directed    Diet - low sodium heart healthy    Complete by:  As directed    Discharge instructions    Complete by:  As directed    Home with hospice   Increase activity slowly    Complete by:  As directed      Allergies as of 09/08/2016      Reactions   Contrast Media [iodinated Diagnostic Agents] Other (See Comments)   Renal insufficiency   Fluoxetine Rash   Sulfonamide Derivatives Nausea Only      Medication List    STOP taking these medications   clopidogrel 75 MG tablet Commonly known as:  PLAVIX   predniSONE 20 MG tablet Commonly known as:  DELTASONE   prochlorperazine 10 MG tablet Commonly known as:  COMPAZINE     TAKE these medications   amoxicillin-clavulanate 500-125 MG tablet Commonly known as:  AUGMENTIN Take 1 tablet (500 mg total) by mouth every 12 (twelve) hours.  aspirin 325 MG tablet Take 1 tablet (325 mg total) by mouth daily.   atorvastatin 80 MG tablet Commonly known as:  LIPITOR Take 80 mg by mouth daily after supper.   bisoprolol 5 MG tablet Commonly known as:  ZEBETA take 1/2 tablet by mouth once daily **PLEASE KEEP UPCOMING APPOINTMENT FOR FURTHER REFILLS**   brinzolamide 1 % ophthalmic suspension Commonly known as:  AZOPT Place 1 drop into both eyes 2 (two) times daily.   calcitRIOL 0.25 MCG capsule Commonly known as:  ROCALTROL Take 0.25 mcg by mouth every Monday, Wednesday, and Friday.   citalopram 10 MG tablet Commonly known as:  CELEXA Take 2 tablets (20 mg total) by mouth daily after supper. What changed:  medication strength   febuxostat 40 MG tablet Commonly known as:  ULORIC Take 40 mg by mouth daily.   feeding supplement Liqd Take 1 Container by mouth 3 (three) times daily between meals.   feeding supplement (ENSURE ENLIVE) Liqd Take 237 mLs by mouth 2 (two) times daily between meals. Start taking on:   09/09/2016   FERGON PO Take 1 tablet by mouth daily after supper.   fluconazole 100 MG tablet Commonly known as:  DIFLUCAN Take 1 tablet (100 mg total) by mouth daily. Start taking on:  05/10/1094   folic acid 1 MG tablet Commonly known as:  FOLVITE Take 1 mg by mouth daily.   furosemide 40 MG tablet Commonly known as:  LASIX Take 40 mg by mouth daily.   HUMALOG KWIKPEN 100 UNIT/ML KiwkPen Generic drug:  insulin lispro Inject 2-3 Units into the skin 3 (three) times daily before meals.   hydrALAZINE 50 MG tablet Commonly known as:  APRESOLINE Take 1 tablet (50 mg total) by mouth 2 (two) times daily.   HYDROcodone-homatropine 5-1.5 MG/5ML syrup Commonly known as:  HYCODAN Take 2.5 mLs by mouth every 4 (four) hours as needed for cough.   insulin glargine 100 unit/mL Sopn Commonly known as:  LANTUS Inject 12 Units into the skin daily before breakfast.   isosorbide mononitrate 60 MG 24 hr tablet Commonly known as:  IMDUR Take 60 mg by mouth daily after supper.   latanoprost 0.005 % ophthalmic solution Commonly known as:  XALATAN Place 1 drop into both eyes at bedtime.   levothyroxine 50 MCG tablet Commonly known as:  SYNTHROID, LEVOTHROID Take 50 mcg by mouth daily after supper.   lidocaine-prilocaine cream Commonly known as:  EMLA Apply 1 application topically as needed. What changed:  when to take this  reasons to take this   mirtazapine 30 MG tablet Commonly known as:  REMERON Take 1 tablet (30 mg total) by mouth at bedtime. What changed:  how much to take   nitroGLYCERIN 0.4 MG SL tablet Commonly known as:  NITROSTAT Place 1 tablet (0.4 mg total) under the tongue every 5 (five) minutes as needed for chest pain.   pantoprazole 40 MG tablet Commonly known as:  PROTONIX Take 1 tablet (40 mg total) by mouth 2 (two) times daily.   REFRESH TEARS OP Place 1 drop into both eyes 3 (three) times daily as needed (dry eyes). Both eyes.   senna 8.6 MG  tablet Commonly known as:  SENOKOT Take 1 tablet by mouth daily as needed for constipation.      Follow-up Information    [facility] Follow up.   Specialty:  Hospice and Palliative Medicine Why:  home nurse visit Contact information: [place] [address] [phone]  Curt Bears, MD. Schedule an appointment as soon as possible for a visit in 1 week(s).   Specialty:  Oncology Contact information: [place] [address] [phone]        [name]. Schedule an appointment as soon as possible for a visit in 1 week(s).   Specialty:  Surgery Contact information: [place]          Allergies  Allergen Reactions  . Contrast Media [Iodinated Diagnostic Agents] Other (See Comments)    Renal insufficiency  . Fluoxetine Rash  . Sulfonamide Derivatives Nausea Only    Consultations:  GI - [place]    Procedures/Studies: Dg Chest 2 View  Result Date: 09/01/2016 CLINICAL DATA:  84 year old with current history of right upper and lower lobe lung cancer for which she is undergoing treatment, presenting with progressively worsening fatigue, cough, chest congestion and shortness of breath over the past few days. EXAM: CHEST  2 VIEW COMPARISON:  08/24/2016, 08/17/2016 and earlier, including PET-CT 07/30/2016. FINDINGS: AP semi-erect and lateral images were obtained. Cardiac CT mildly enlarged for AP technique, unchanged. Thoracic aorta atherosclerotic, unchanged. Right hilar lymphadenopathy and the multifocal masses throughout the right lung identified on the examinations 1 month ago have shown significant interval improvement. No new pulmonary parenchymal abnormalities in either lung. No pleural effusions. Visualized bony thorax intact. IMPRESSION: Improving right hilar lymphadenopathy and the multifocal masses throughout the right lung. No new/acute cardiopulmonary disease. Stable mild cardiomegaly without  pulmonary edema. Electronically Signed   By: Evangeline Dakin M.D.   On: 09/01/2016 11:55   Dg Chest 2 View  Result Date: 08/24/2016 CLINICAL DATA:  Patient with history of lung cancer.  Hemoptysis. EXAM: CHEST  2 VIEW COMPARISON:  Chest radiograph 08/17/2016 FINDINGS: Monitoring leads overlie the patient. Stable enlarged cardiac and mediastinal contours. Aortic atherosclerosis. Opacities within the right upper and right lower lobes compatible with known lesions. Pulmonary hyperinflation. No pleural effusion or pneumothorax. Thoracic spine degenerative changes. Aortic atherosclerosis. IMPRESSION: Cardiomegaly.  Aortic atherosclerosis. Known multifocal pulmonary malignancy within the right lung. Electronically Signed   By: Lovey Newcomer M.D.   On: 08/24/2016 13:54   Dg Chest 2 View  Result Date: 08/17/2016 CLINICAL DATA:  Stage IV lung cancer. Acute infarct of the left temporal lobe. EXAM: CHEST  2 VIEW COMPARISON:  CT of the chest 07/30/2016. FINDINGS: The heart is mildly enlarged. Atherosclerotic calcifications are present at the aortic arch. Right lower lobe and upper lobe opacities compatible with known mass lesions. Mild chronic interstitial coarsening is present as well. Bibasilar atelectasis is present. There are no significant effusions. The visualized soft tissues and bony thorax are unremarkable. IMPRESSION: 1. Multifocal neoplasm in the right upper lower lobes. 2. Aortic atherosclerosis. 3. No superimposed acute disease. Electronically Signed   By: San Morelle M.D.   On: 08/17/2016 08:05   [name] Contrast  Result Date: 08/16/2016 CLINICAL DATA:  Stage IV squamous cell carcinoma of the Lung EXAM: MRI HEAD WITHOUT CONTRAST TECHNIQUE: Multiplanar, multiecho pulse sequences of the brain and surrounding structures were obtained without intravenous contrast. COMPARISON:  CT head without contrast 05/11/2010. FINDINGS: Brain: A 4 mm acute nonhemorrhagic white matter infarct is present in  the left temporal lobe adjacent to the left lateral ventricle. No other acute infarcts are present. Remote bilateral parietal lobe infarcts are present. Advanced atrophy and white matter disease is again seen bilaterally. Remote occipital lobe infarcts are present. A remote infarct is present in the anterior left frontal lobe. No focal  mass lesion is present. The ventricles are proportionate to the degree of atrophy with areas of ex vacuo dilation. White matter changes extend into the brainstem. Left greater than right remote cerebellar infarcts are present. No acute hemorrhage is present. No significant extraaxial fluid collection is present. Vascular: Flow is present in the major intracranial arteries. Skull and upper cervical spine: The skullbase is within normal limits. The craniocervical junction is normal. Midline sagittal structures are within normal limits. The upper cervical spine is unremarkable. Marrow signal is normal. Sinuses/Orbits: Mid left ethmoid air cells are opacified. The remaining paranasal sinuses and mastoid air cells are clear. Bilateral lens replacements are present. The globes and orbits are otherwise within normal limits. IMPRESSION: 1. Acute nonhemorrhagic 4 mm white matter infarct in the left temporal lobe. 2. Remote cortical infarcts bilaterally involving both parietal lobes, both occipital lobes, in the anterior left frontal lobe. 3. Remote white matter infarcts involving the left greater than right cerebellum. 4. Minimal left ethmoid sinus disease. These results were called by telephone at the time of interpretation on 08/16/2016 at 4:37 pm to Dr. Curt Bears , who verbally acknowledged these results. Dr. [name] asked that the patient be directed to the [facility] Emergency Department for further evaluation. The MRI technologist communicated this to the patient who agreed to proceed to the [facility] Emergency Department. Electronically Signed   By: San Morelle M.D.   On:  08/16/2016 16:39   Dg Chest Port 1 View  Result Date: 09/04/2016 CLINICAL DATA:  PICC line placement EXAM: PORTABLE CHEST 1 VIEW COMPARISON:  09/01/2016 FINDINGS: Stable cardiomegaly with aortic atherosclerosis. New left-sided PICC line tip terminates in the distal SVC. New confluent airspace opacities are seen in the right lower lobe concerning for pneumonia. Apical pleuroparenchymal thickening and scarring seen at the right lung apex. No acute osseous abnormality. IMPRESSION: 1. PICC line tip in the distal SVC. 2. New confluent airspace opacity in the right lung base suspicious for pneumonia. 3. Cardiomegaly with aortic atherosclerosis. Electronically Signed   By: Ashley Royalty M.D.   On: 09/04/2016 21:11   Dg Abd 2 Views  Result Date: 09/03/2016 CLINICAL DATA:  Acute bleeding gastric ulcer. EXAM: ABDOMEN - 2 VIEW COMPARISON:  None. FINDINGS: No free air is identified. Three biopsy clips are seen in the right upper quadrant projecting over the expected location of the distal ascending and proximal transverse colon and one in the right lower quad. Scattered air containing small and large bowel loops without obstruction are noted. No pneumothorax. Bone-on-bone apposition of the left hip with subchondral sclerosis and degenerative subchondral cystic change is identified. Aortoiliac atherosclerosis is noted. Injection granulomas project over the right gluteal region. No radio-opaque calculi or other significant radiographic abnormality is seen. IMPRESSION: 1. No free air identified. Scattered air containing small large bowel loops without obstruction. 2. Severe osteoarthritic joint space narrowing of the left hip with bone-on-bone apposition. 3. Three biopsy clips are seen in the right upper quadrant of the abdomen and one in the right lower quadrant. Electronically Signed   By: Ashley Royalty M.D.   On: 09/03/2016 19:31   Mr Jodene Nam Head/brain TI Cm  Result Date: 08/17/2016 CLINICAL DATA:  TIA.  History of lung  cancer. EXAM: MRA HEAD WITHOUT CONTRAST TECHNIQUE: Angiographic images of the Circle of Willis were obtained using MRA technique without intravenous contrast. COMPARISON:  Brain MRI 08/16/2016 FINDINGS: Intracranial internal carotid arteries: There is mild irregular narrowing of the right internal carotid artery lacerum and cavernous  segments. There is approximately 50% narrowing of the left internal carotid artery lacerum segment (series 3 image 87 Anterior cerebral arteries: Normal. Middle cerebral arteries: Normal. Posterior communicating arteries: Absent bilaterally. Posterior cerebral arteries: Normal. Basilar artery: Normal. Vertebral arteries: Left dominant. Normal. Superior cerebellar arteries: Normal. Anterior inferior cerebellar arteries: Normal. Posterior inferior cerebellar arteries: Normal. IMPRESSION: 1. No intracranial arterial occlusion or high-grade stenosis. 2. Mild atherosclerotic narrowing of both intracranial internal carotid arteries. Electronically Signed   By: Ulyses Jarred M.D.   On: 08/17/2016 06:30    Discharge Exam: Vitals:   09/08/16 0450 09/08/16 1438  BP: (!) 154/65 138/64  Pulse: 65 64  Resp: 16   Temp: 98.7 F (37.1 C) 98 F (36.7 C)   Vitals:   09/07/16 2123 09/08/16 0450 09/08/16 0627 09/08/16 1438  BP:  (!) 154/65  138/64  Pulse:  65  64  Resp:  16    Temp:  98.7 F (37.1 C)  98 F (36.7 C)  TempSrc:  Oral  Oral  SpO2: 96% 96% 94%   Weight:  70.4 kg (155 lb 3.2 oz)    Height:        General: Pt is alert, awake, not in acute distress Cardiovascular: RRR, S1/S2 +, no rubs, no gallops Respiratory: Good air entry, mild b/l rhonchi, no wheezing   Abdominal: Soft, NT, ND, bowel sounds + Extremities: no edema, no cyanosis   The results of significant diagnostics from this hospitalization (including imaging, microbiology, ancillary and laboratory) are listed below for reference.     Microbiology: Recent Results (from the past 240 hour(s))  Blood  Culture (routine x 2)     Status: Abnormal   Collection Time: 09/01/16  1:08 PM  Result Value Ref Range Status   Specimen Description BLOOD LEFT WRIST  Final   Special Requests   Final    BOTTLES DRAWN AEROBIC ONLY Blood Culture adequate volume   Culture  Setup Time   Final    GRAM POSITIVE COCCI IN PAIRS AEROBIC BOTTLE ONLY CRITICAL RESULT CALLED TO, READ BACK BY AND VERIFIED WITH: N GLOGOVAC PHARMD 2153 09/02/16 A BROWNING    Culture (A)  Final    MICROCOCCUS LUTEUS/LYLAE THE SIGNIFICANCE OF ISOLATING THIS ORGANISM FROM A SINGLE SET OF BLOOD CULTURES WHEN MULTIPLE SETS ARE DRAWN IS UNCERTAIN. PLEASE NOTIFY THE MICROBIOLOGY DEPARTMENT WITHIN ONE WEEK IF SPECIATION AND SENSITIVITIES ARE REQUIRED. Performed at [facility], [place] [address] [place], [place] [address]    Report Status 09/03/2016 FINAL  Final  Blood Culture ID Panel (Reflexed)     Status: None   Collection Time: 09/01/16  1:08 PM  Result Value Ref Range Status   Enterococcus species NOT DETECTED NOT DETECTED Final   Listeria monocytogenes NOT DETECTED NOT DETECTED Final   Staphylococcus species NOT DETECTED NOT DETECTED Final   Staphylococcus aureus NOT DETECTED NOT DETECTED Final   Streptococcus species NOT DETECTED NOT DETECTED Final   Streptococcus agalactiae NOT DETECTED NOT DETECTED Final   Streptococcus pneumoniae NOT DETECTED NOT DETECTED Final   Streptococcus pyogenes NOT DETECTED NOT DETECTED Final   Acinetobacter baumannii NOT DETECTED NOT DETECTED Final   Enterobacteriaceae species NOT DETECTED NOT DETECTED Final   Enterobacter cloacae complex NOT DETECTED NOT DETECTED Final   Escherichia coli NOT DETECTED NOT DETECTED Final   Klebsiella oxytoca NOT DETECTED NOT DETECTED Final   Klebsiella pneumoniae NOT DETECTED NOT DETECTED Final   Proteus species NOT DETECTED NOT DETECTED Final   Serratia marcescens NOT  DETECTED NOT DETECTED Final   Haemophilus influenzae NOT DETECTED NOT DETECTED Final   Neisseria  meningitidis NOT DETECTED NOT DETECTED Final   Pseudomonas aeruginosa NOT DETECTED NOT DETECTED Final   Candida albicans NOT DETECTED NOT DETECTED Final   Candida glabrata NOT DETECTED NOT DETECTED Final   Candida krusei NOT DETECTED NOT DETECTED Final   Candida parapsilosis NOT DETECTED NOT DETECTED Final   Candida tropicalis NOT DETECTED NOT DETECTED Final    Comment: Performed at [facility], [place] [address], [place], [place] [address]  Blood Culture (routine x 2)     Status: None   Collection Time: 09/01/16  2:28 PM  Result Value Ref Range Status   Specimen Description BLOOD RIGHT HAND  Final   Special Requests   Final    BOTTLES DRAWN AEROBIC AND ANAEROBIC Blood Culture adequate volume   Culture   Final    NO GROWTH 5 DAYS Performed at [facility], [place] [address] [place], [place] [address]    Report Status 09/06/2016 FINAL  Final  MRSA PCR Screening     Status: None   Collection Time: 09/02/16  9:51 AM  Result Value Ref Range Status   MRSA by PCR NEGATIVE NEGATIVE Final    Comment:        The GeneXpert MRSA Assay (FDA approved for NASAL specimens only), is one component of a comprehensive MRSA colonization surveillance program. It is not intended to diagnose MRSA infection nor to guide or monitor treatment for MRSA infections.   Culture, blood (Routine X 2) w Reflex to ID Panel     Status: None   Collection Time: 09/03/16  2:35 PM  Result Value Ref Range Status   Specimen Description BLOOD LEFT ARM  Final   Special Requests IN PEDIATRIC BOTTLE Blood Culture adequate volume  Final   Culture   Final    NO GROWTH 5 DAYS Performed at [facility], [address], [place], [place] [address]    Report Status 09/08/2016 FINAL  Final  Culture, blood (Routine X 2) w Reflex to ID Panel     Status: None   Collection Time: 09/03/16  2:38 PM  Result Value Ref Range Status   Specimen Description BLOOD LEFT ARM  Final   Special Requests IN PEDIATRIC BOTTLE  Blood Culture adequate volume  Final   Culture   Final    NO GROWTH 5 DAYS Performed at [facility], [place] [address], [place], [place] [address]    Report Status 09/08/2016 FINAL  Final     Labs: BNP (last 3 results) No results for input(s): BNP in the last 8760 hours. Basic Metabolic Panel:  Recent Labs Lab 09/04/16 0416 09/05/16 0422 09/06/16 0501 09/07/16 0510 09/08/16 1020  NA 146* 146* 146* 145 143  K 3.9 4.1 4.2 4.2 4.4  CL 125* 124* 125* 124* 123*  CO2 15* 16* 17* 17* 17*  GLUCOSE 79 140* 77 162* 201*  BUN 48* 37* 28* 26* 19  CREATININE 2.38* 2.37* 2.24* 2.19* 2.03*  CALCIUM 7.4* 7.8* 7.8* 8.1* 8.1*  MG  --   --  1.8  --   --    Liver Function Tests:  Recent Labs Lab 09/04/16 0416  ALBUMIN 2.2*   No results for input(s): LIPASE, AMYLASE in the last 168 hours. No results for input(s): AMMONIA in the last 168 hours. CBC:  Recent Labs Lab 09/04/16 1758 09/05/16 0422 09/05/16 1354 09/06/16 0501 09/07/16 0510  09/08/16 0138 09/08/16 1020  WBC 19.8* 15.8*  --  12.8* 13.7*  --  13.3*  NEUTROABS  --   --   --   --  11.1*  --   --   HGB 7.8* 7.1* 8.9* 8.2* 7.9* 9.5* 9.5*  HCT 23.1* 21.2* 26.8* 24.8* 25.5* 28.9* 29.8*  MCV 90.9 91.0  --  92.2 98.1  --  93.7  PLT 75* 65*  --  68* 102*  --  128*   Cardiac Enzymes: No results for input(s): CKTOTAL, CKMB, CKMBINDEX, TROPONINI in the last 168 hours. BNP: Invalid input(s): POCBNP CBG:  Recent Labs Lab 09/07/16 2144 09/07/16 2240 09/07/16 2334 09/08/16 0745 09/08/16 1131  GLUCAP 55* 70 120* 129* 169*   D-Dimer No results for input(s): DDIMER in the last 72 hours. Hgb A1c No results for input(s): HGBA1C in the last 72 hours. Lipid Profile No results for input(s): CHOL, HDL, LDLCALC, TRIG, CHOLHDL, LDLDIRECT in the last 72 hours. Thyroid function studies No results for input(s): TSH, T4TOTAL, T3FREE, THYROIDAB in the last 72 hours.  Invalid input(s): FREET3 Anemia work up  Recent Labs   09/06/16 0800  VITAMINB12 2,382*  FOLATE 58.8  FERRITIN 538*  TIBC 164*  IRON 32  RETICCTPCT 3.5*   Urinalysis    Component Value Date/Time   COLORURINE YELLOW 09/01/2016 1118   APPEARANCEUR CLEAR 09/01/2016 1118   LABSPEC 1.015 09/01/2016 1118   PHURINE 5.0 09/01/2016 1118   GLUCOSEU 50 (A) 09/01/2016 1118   HGBUR SMALL (A) 09/01/2016 1118   BILIRUBINUR NEGATIVE 09/01/2016 1118   KETONESUR NEGATIVE 09/01/2016 1118   PROTEINUR NEGATIVE 09/01/2016 1118   UROBILINOGEN 0.2 05/11/2010 0004   NITRITE NEGATIVE 09/01/2016 1118   LEUKOCYTESUR TRACE (A) 09/01/2016 1118   Sepsis Labs Invalid input(s): PROCALCITONIN,  WBC,  LACTICIDVEN Microbiology Recent Results (from the past 240 hour(s))  Blood Culture (routine x 2)     Status: Abnormal   Collection Time: 09/01/16  1:08 PM  Result Value Ref Range Status   Specimen Description BLOOD LEFT WRIST  Final   Special Requests   Final    BOTTLES DRAWN AEROBIC ONLY Blood Culture adequate volume   Culture  Setup Time   Final    GRAM POSITIVE COCCI IN PAIRS AEROBIC BOTTLE ONLY CRITICAL RESULT CALLED TO, READ BACK BY AND VERIFIED WITH: N GLOGOVAC PHARMD 2153 09/02/16 A BROWNING    Culture (A)  Final    MICROCOCCUS LUTEUS/LYLAE THE SIGNIFICANCE OF ISOLATING THIS ORGANISM FROM A SINGLE SET OF BLOOD CULTURES WHEN MULTIPLE SETS ARE DRAWN IS UNCERTAIN. PLEASE NOTIFY THE MICROBIOLOGY DEPARTMENT WITHIN ONE WEEK IF SPECIATION AND SENSITIVITIES ARE REQUIRED. Performed at [facility], [place] [address] [place], [place] [address]    Report Status 09/03/2016 FINAL  Final  Blood Culture ID Panel (Reflexed)     Status: None   Collection Time: 09/01/16  1:08 PM  Result Value Ref Range Status   Enterococcus species NOT DETECTED NOT DETECTED Final   Listeria monocytogenes NOT DETECTED NOT DETECTED Final   Staphylococcus species NOT DETECTED NOT DETECTED Final   Staphylococcus aureus NOT DETECTED NOT DETECTED Final   Streptococcus species NOT  DETECTED NOT DETECTED Final   Streptococcus agalactiae NOT DETECTED NOT DETECTED Final   Streptococcus pneumoniae NOT DETECTED NOT DETECTED Final   Streptococcus pyogenes NOT DETECTED NOT DETECTED Final   Acinetobacter baumannii NOT DETECTED NOT DETECTED Final   Enterobacteriaceae species NOT DETECTED NOT DETECTED Final   Enterobacter cloacae complex NOT DETECTED  NOT DETECTED Final   Escherichia coli NOT DETECTED NOT DETECTED Final   Klebsiella oxytoca NOT DETECTED NOT DETECTED Final   Klebsiella pneumoniae NOT DETECTED NOT DETECTED Final   Proteus species NOT DETECTED NOT DETECTED Final   Serratia marcescens NOT DETECTED NOT DETECTED Final   Haemophilus influenzae NOT DETECTED NOT DETECTED Final   Neisseria meningitidis NOT DETECTED NOT DETECTED Final   Pseudomonas aeruginosa NOT DETECTED NOT DETECTED Final   Candida albicans NOT DETECTED NOT DETECTED Final   Candida glabrata NOT DETECTED NOT DETECTED Final   Candida krusei NOT DETECTED NOT DETECTED Final   Candida parapsilosis NOT DETECTED NOT DETECTED Final   Candida tropicalis NOT DETECTED NOT DETECTED Final    Comment: Performed at [facility], [place] [address], [place], [place] [address]  Blood Culture (routine x 2)     Status: None   Collection Time: 09/01/16  2:28 PM  Result Value Ref Range Status   Specimen Description BLOOD RIGHT HAND  Final   Special Requests   Final    BOTTLES DRAWN AEROBIC AND ANAEROBIC Blood Culture adequate volume   Culture   Final    NO GROWTH 5 DAYS Performed at [facility], [place] [address] [place], [place] [address]    Report Status 09/06/2016 FINAL  Final  MRSA PCR Screening     Status: None   Collection Time: 09/02/16  9:51 AM  Result Value Ref Range Status   MRSA by PCR NEGATIVE NEGATIVE Final    Comment:        The GeneXpert MRSA Assay (FDA approved for NASAL specimens only), is one component of a comprehensive MRSA colonization surveillance program. It is not intended to  diagnose MRSA infection nor to guide or monitor treatment for MRSA infections.   Culture, blood (Routine X 2) w Reflex to ID Panel     Status: None   Collection Time: 09/03/16  2:35 PM  Result Value Ref Range Status   Specimen Description BLOOD LEFT ARM  Final   Special Requests IN PEDIATRIC BOTTLE Blood Culture adequate volume  Final   Culture   Final    NO GROWTH 5 DAYS Performed at [facility], [address], [place], [place] [address]    Report Status 09/08/2016 FINAL  Final  Culture, blood (Routine X 2) w Reflex to ID Panel     Status: None   Collection Time: 09/03/16  2:38 PM  Result Value Ref Range Status   Specimen Description BLOOD LEFT ARM  Final   Special Requests IN PEDIATRIC BOTTLE Blood Culture adequate volume  Final   Culture   Final    NO GROWTH 5 DAYS Performed at [facility], [place] [address] [place],  [address]    Report Status 09/08/2016 FINAL  Final     Time coordinating discharge: 38 minutes  SIGNED:  Chipper Oman, MD  [facility] 09/08/2016, 4:17 PM  Pager please text page via  [email or web address] Password [email or web address]

## 2016-09-08 NOTE — Progress Notes (Signed)
Hypoglycemic Event  CBG: 55 at 21:44  Treatment: 15 GM carbohydrate snack  Symptoms: None  Follow-up CBG: Time: 22:40 CBG Result: 70  Rechecked at 23:34 and CBG was 120  Possible Reasons for Event: Unknown  Comments/MD notified: MD notified via text page    Erica Jimenez [facility]

## 2016-09-09 ENCOUNTER — Telehealth: Payer: Self-pay | Admitting: Medical Oncology

## 2016-09-09 DIAGNOSIS — I10 Essential (primary) hypertension: Secondary | ICD-10-CM | POA: Diagnosis not present

## 2016-09-09 DIAGNOSIS — H409 Unspecified glaucoma: Secondary | ICD-10-CM | POA: Diagnosis not present

## 2016-09-09 DIAGNOSIS — E46 Unspecified protein-calorie malnutrition: Secondary | ICD-10-CM | POA: Diagnosis not present

## 2016-09-09 DIAGNOSIS — C3490 Malignant neoplasm of unspecified part of unspecified bronchus or lung: Secondary | ICD-10-CM | POA: Diagnosis not present

## 2016-09-09 DIAGNOSIS — M109 Gout, unspecified: Secondary | ICD-10-CM | POA: Diagnosis not present

## 2016-09-09 DIAGNOSIS — J449 Chronic obstructive pulmonary disease, unspecified: Secondary | ICD-10-CM | POA: Diagnosis not present

## 2016-09-09 DIAGNOSIS — I679 Cerebrovascular disease, unspecified: Secondary | ICD-10-CM | POA: Diagnosis not present

## 2016-09-09 DIAGNOSIS — I25119 Atherosclerotic heart disease of native coronary artery with unspecified angina pectoris: Secondary | ICD-10-CM | POA: Diagnosis not present

## 2016-09-09 DIAGNOSIS — N184 Chronic kidney disease, stage 4 (severe): Secondary | ICD-10-CM | POA: Diagnosis not present

## 2016-09-09 DIAGNOSIS — I4891 Unspecified atrial fibrillation: Secondary | ICD-10-CM | POA: Diagnosis not present

## 2016-09-09 DIAGNOSIS — D638 Anemia in other chronic diseases classified elsewhere: Secondary | ICD-10-CM | POA: Diagnosis not present

## 2016-09-09 DIAGNOSIS — E1159 Type 2 diabetes mellitus with other circulatory complications: Secondary | ICD-10-CM | POA: Diagnosis not present

## 2016-09-09 DIAGNOSIS — F339 Major depressive disorder, recurrent, unspecified: Secondary | ICD-10-CM | POA: Diagnosis not present

## 2016-09-09 DIAGNOSIS — E039 Hypothyroidism, unspecified: Secondary | ICD-10-CM | POA: Diagnosis not present

## 2016-09-09 DIAGNOSIS — K922 Gastrointestinal hemorrhage, unspecified: Secondary | ICD-10-CM | POA: Diagnosis not present

## 2016-09-09 LAB — H PYLORI, IGM, IGG, IGA AB
H PYLORI IGG: 0.93 {index_val} — AB (ref 0.00–0.79)
H. Pylogi, Iga Abs: 10.5 units — ABNORMAL HIGH (ref 0.0–8.9)
H. Pylogi, Igm Abs: <9 units (ref 0.0–8.9)

## 2016-09-09 NOTE — Telephone Encounter (Signed)
Daughter called and cancelled all appts as pt is now on hospice under Dr. Harolyn Rutherford. She also stated her mothers hand is swelling since today . She has used it more today . It is on same arm as her PICC . She denies any signs of infection.PICC line removed. I told her to apply warm moist compresses x 20 minutes qid . She said she will call Dr Harolyn Rutherford for this. She wants to know is her mother  a candidate for radiation therapy? Note to [place].

## 2016-09-09 NOTE — Consult Note (Signed)
   [facility] CM Inpatient Consult   09/09/2016  Erica Jimenez [birth year] [id number]     Patient screened for potential [facility] Care Management services. Chart reviewed. Noted current discharge plan is for home with hospice.  There are no identifiable [facility] Care Management needs at this time. If patient's post hospital needs change, please place a [facility] Care Management consult. For questions please contact:  Marthenia Rolling, [place], RN,BSN [facility] Liaison [phone]

## 2016-09-09 NOTE — Telephone Encounter (Signed)
Yes she can receive palliative XRT but not sure if it can be done with hospice.

## 2016-09-10 ENCOUNTER — Telehealth: Payer: Self-pay | Admitting: Medical Oncology

## 2016-09-10 DIAGNOSIS — I679 Cerebrovascular disease, unspecified: Secondary | ICD-10-CM | POA: Diagnosis not present

## 2016-09-10 DIAGNOSIS — J449 Chronic obstructive pulmonary disease, unspecified: Secondary | ICD-10-CM | POA: Diagnosis not present

## 2016-09-10 DIAGNOSIS — E1159 Type 2 diabetes mellitus with other circulatory complications: Secondary | ICD-10-CM | POA: Diagnosis not present

## 2016-09-10 DIAGNOSIS — I4891 Unspecified atrial fibrillation: Secondary | ICD-10-CM | POA: Diagnosis not present

## 2016-09-10 DIAGNOSIS — I25119 Atherosclerotic heart disease of native coronary artery with unspecified angina pectoris: Secondary | ICD-10-CM | POA: Diagnosis not present

## 2016-09-10 DIAGNOSIS — C3490 Malignant neoplasm of unspecified part of unspecified bronchus or lung: Secondary | ICD-10-CM | POA: Diagnosis not present

## 2016-09-11 ENCOUNTER — Other Ambulatory Visit: Payer: Medicare Other

## 2016-09-11 ENCOUNTER — Telehealth: Payer: Self-pay | Admitting: Medical Oncology

## 2016-09-11 ENCOUNTER — Ambulatory Visit: Payer: Medicare Other | Admitting: Internal Medicine

## 2016-09-11 ENCOUNTER — Ambulatory Visit: Payer: Medicare Other

## 2016-09-11 ENCOUNTER — Encounter: Payer: Medicare Other | Admitting: Nutrition

## 2016-09-11 DIAGNOSIS — I679 Cerebrovascular disease, unspecified: Secondary | ICD-10-CM | POA: Diagnosis not present

## 2016-09-11 DIAGNOSIS — J449 Chronic obstructive pulmonary disease, unspecified: Secondary | ICD-10-CM | POA: Diagnosis not present

## 2016-09-11 DIAGNOSIS — I4891 Unspecified atrial fibrillation: Secondary | ICD-10-CM | POA: Diagnosis not present

## 2016-09-11 DIAGNOSIS — E1159 Type 2 diabetes mellitus with other circulatory complications: Secondary | ICD-10-CM | POA: Diagnosis not present

## 2016-09-11 DIAGNOSIS — I25119 Atherosclerotic heart disease of native coronary artery with unspecified angina pectoris: Secondary | ICD-10-CM | POA: Diagnosis not present

## 2016-09-11 DIAGNOSIS — C3490 Malignant neoplasm of unspecified part of unspecified bronchus or lung: Secondary | ICD-10-CM | POA: Diagnosis not present

## 2016-09-11 NOTE — Telephone Encounter (Signed)
Requests to speak to [facility] -note to [facility].

## 2016-09-11 NOTE — Telephone Encounter (Signed)
I left message that per Dr [name] ,"Yes she can receive palliative XRT but not sure if it can be done with hospice."

## 2016-09-12 DIAGNOSIS — I679 Cerebrovascular disease, unspecified: Secondary | ICD-10-CM | POA: Diagnosis not present

## 2016-09-12 DIAGNOSIS — I25119 Atherosclerotic heart disease of native coronary artery with unspecified angina pectoris: Secondary | ICD-10-CM | POA: Diagnosis not present

## 2016-09-12 DIAGNOSIS — I4891 Unspecified atrial fibrillation: Secondary | ICD-10-CM | POA: Diagnosis not present

## 2016-09-12 DIAGNOSIS — J449 Chronic obstructive pulmonary disease, unspecified: Secondary | ICD-10-CM | POA: Diagnosis not present

## 2016-09-12 DIAGNOSIS — C3490 Malignant neoplasm of unspecified part of unspecified bronchus or lung: Secondary | ICD-10-CM | POA: Diagnosis not present

## 2016-09-12 DIAGNOSIS — E1159 Type 2 diabetes mellitus with other circulatory complications: Secondary | ICD-10-CM | POA: Diagnosis not present

## 2016-09-13 DIAGNOSIS — J449 Chronic obstructive pulmonary disease, unspecified: Secondary | ICD-10-CM | POA: Diagnosis not present

## 2016-09-13 DIAGNOSIS — C3490 Malignant neoplasm of unspecified part of unspecified bronchus or lung: Secondary | ICD-10-CM | POA: Diagnosis not present

## 2016-09-13 DIAGNOSIS — E1159 Type 2 diabetes mellitus with other circulatory complications: Secondary | ICD-10-CM | POA: Diagnosis not present

## 2016-09-13 DIAGNOSIS — I4891 Unspecified atrial fibrillation: Secondary | ICD-10-CM | POA: Diagnosis not present

## 2016-09-13 DIAGNOSIS — I25119 Atherosclerotic heart disease of native coronary artery with unspecified angina pectoris: Secondary | ICD-10-CM | POA: Diagnosis not present

## 2016-09-13 DIAGNOSIS — I679 Cerebrovascular disease, unspecified: Secondary | ICD-10-CM | POA: Diagnosis not present

## 2016-09-15 DIAGNOSIS — C3490 Malignant neoplasm of unspecified part of unspecified bronchus or lung: Secondary | ICD-10-CM | POA: Diagnosis not present

## 2016-09-15 DIAGNOSIS — J449 Chronic obstructive pulmonary disease, unspecified: Secondary | ICD-10-CM | POA: Diagnosis not present

## 2016-09-15 DIAGNOSIS — I4891 Unspecified atrial fibrillation: Secondary | ICD-10-CM | POA: Diagnosis not present

## 2016-09-15 DIAGNOSIS — E1159 Type 2 diabetes mellitus with other circulatory complications: Secondary | ICD-10-CM | POA: Diagnosis not present

## 2016-09-15 DIAGNOSIS — E785 Hyperlipidemia, unspecified: Secondary | ICD-10-CM | POA: Diagnosis not present

## 2016-09-15 DIAGNOSIS — I129 Hypertensive chronic kidney disease with stage 1 through stage 4 chronic kidney disease, or unspecified chronic kidney disease: Secondary | ICD-10-CM | POA: Diagnosis not present

## 2016-09-15 DIAGNOSIS — D631 Anemia in chronic kidney disease: Secondary | ICD-10-CM | POA: Diagnosis not present

## 2016-09-15 DIAGNOSIS — Z794 Long term (current) use of insulin: Secondary | ICD-10-CM | POA: Diagnosis not present

## 2016-09-15 DIAGNOSIS — E039 Hypothyroidism, unspecified: Secondary | ICD-10-CM | POA: Diagnosis not present

## 2016-09-15 DIAGNOSIS — I25119 Atherosclerotic heart disease of native coronary artery with unspecified angina pectoris: Secondary | ICD-10-CM | POA: Diagnosis not present

## 2016-09-15 DIAGNOSIS — E119 Type 2 diabetes mellitus without complications: Secondary | ICD-10-CM | POA: Diagnosis not present

## 2016-09-15 DIAGNOSIS — N184 Chronic kidney disease, stage 4 (severe): Secondary | ICD-10-CM | POA: Diagnosis not present

## 2016-09-15 DIAGNOSIS — N2581 Secondary hyperparathyroidism of renal origin: Secondary | ICD-10-CM | POA: Diagnosis not present

## 2016-09-15 DIAGNOSIS — I679 Cerebrovascular disease, unspecified: Secondary | ICD-10-CM | POA: Diagnosis not present

## 2016-09-17 DIAGNOSIS — C3490 Malignant neoplasm of unspecified part of unspecified bronchus or lung: Secondary | ICD-10-CM | POA: Diagnosis not present

## 2016-09-17 DIAGNOSIS — I25119 Atherosclerotic heart disease of native coronary artery with unspecified angina pectoris: Secondary | ICD-10-CM | POA: Diagnosis not present

## 2016-09-17 DIAGNOSIS — I4891 Unspecified atrial fibrillation: Secondary | ICD-10-CM | POA: Diagnosis not present

## 2016-09-17 DIAGNOSIS — J449 Chronic obstructive pulmonary disease, unspecified: Secondary | ICD-10-CM | POA: Diagnosis not present

## 2016-09-17 DIAGNOSIS — I679 Cerebrovascular disease, unspecified: Secondary | ICD-10-CM | POA: Diagnosis not present

## 2016-09-17 DIAGNOSIS — E1159 Type 2 diabetes mellitus with other circulatory complications: Secondary | ICD-10-CM | POA: Diagnosis not present

## 2016-09-18 ENCOUNTER — Other Ambulatory Visit: Payer: Medicare Other

## 2016-09-19 DIAGNOSIS — J449 Chronic obstructive pulmonary disease, unspecified: Secondary | ICD-10-CM | POA: Diagnosis not present

## 2016-09-19 DIAGNOSIS — C3490 Malignant neoplasm of unspecified part of unspecified bronchus or lung: Secondary | ICD-10-CM | POA: Diagnosis not present

## 2016-09-19 DIAGNOSIS — I679 Cerebrovascular disease, unspecified: Secondary | ICD-10-CM | POA: Diagnosis not present

## 2016-09-19 DIAGNOSIS — E1159 Type 2 diabetes mellitus with other circulatory complications: Secondary | ICD-10-CM | POA: Diagnosis not present

## 2016-09-19 DIAGNOSIS — I4891 Unspecified atrial fibrillation: Secondary | ICD-10-CM | POA: Diagnosis not present

## 2016-09-19 DIAGNOSIS — I25119 Atherosclerotic heart disease of native coronary artery with unspecified angina pectoris: Secondary | ICD-10-CM | POA: Diagnosis not present

## 2016-09-22 DIAGNOSIS — E1159 Type 2 diabetes mellitus with other circulatory complications: Secondary | ICD-10-CM | POA: Diagnosis not present

## 2016-09-22 DIAGNOSIS — I4891 Unspecified atrial fibrillation: Secondary | ICD-10-CM | POA: Diagnosis not present

## 2016-09-22 DIAGNOSIS — J449 Chronic obstructive pulmonary disease, unspecified: Secondary | ICD-10-CM | POA: Diagnosis not present

## 2016-09-22 DIAGNOSIS — I25119 Atherosclerotic heart disease of native coronary artery with unspecified angina pectoris: Secondary | ICD-10-CM | POA: Diagnosis not present

## 2016-09-22 DIAGNOSIS — I679 Cerebrovascular disease, unspecified: Secondary | ICD-10-CM | POA: Diagnosis not present

## 2016-09-22 DIAGNOSIS — C3490 Malignant neoplasm of unspecified part of unspecified bronchus or lung: Secondary | ICD-10-CM | POA: Diagnosis not present

## 2016-09-23 DIAGNOSIS — I679 Cerebrovascular disease, unspecified: Secondary | ICD-10-CM | POA: Diagnosis not present

## 2016-09-23 DIAGNOSIS — C3490 Malignant neoplasm of unspecified part of unspecified bronchus or lung: Secondary | ICD-10-CM | POA: Diagnosis not present

## 2016-09-23 DIAGNOSIS — I4891 Unspecified atrial fibrillation: Secondary | ICD-10-CM | POA: Diagnosis not present

## 2016-09-23 DIAGNOSIS — I25119 Atherosclerotic heart disease of native coronary artery with unspecified angina pectoris: Secondary | ICD-10-CM | POA: Diagnosis not present

## 2016-09-23 DIAGNOSIS — E1159 Type 2 diabetes mellitus with other circulatory complications: Secondary | ICD-10-CM | POA: Diagnosis not present

## 2016-09-23 DIAGNOSIS — J449 Chronic obstructive pulmonary disease, unspecified: Secondary | ICD-10-CM | POA: Diagnosis not present

## 2016-09-24 DIAGNOSIS — E1159 Type 2 diabetes mellitus with other circulatory complications: Secondary | ICD-10-CM | POA: Diagnosis not present

## 2016-09-24 DIAGNOSIS — J449 Chronic obstructive pulmonary disease, unspecified: Secondary | ICD-10-CM | POA: Diagnosis not present

## 2016-09-24 DIAGNOSIS — I4891 Unspecified atrial fibrillation: Secondary | ICD-10-CM | POA: Diagnosis not present

## 2016-09-24 DIAGNOSIS — I25119 Atherosclerotic heart disease of native coronary artery with unspecified angina pectoris: Secondary | ICD-10-CM | POA: Diagnosis not present

## 2016-09-24 DIAGNOSIS — I679 Cerebrovascular disease, unspecified: Secondary | ICD-10-CM | POA: Diagnosis not present

## 2016-09-24 DIAGNOSIS — C3490 Malignant neoplasm of unspecified part of unspecified bronchus or lung: Secondary | ICD-10-CM | POA: Diagnosis not present

## 2016-09-25 ENCOUNTER — Other Ambulatory Visit: Payer: Medicare Other

## 2016-09-30 DIAGNOSIS — E1159 Type 2 diabetes mellitus with other circulatory complications: Secondary | ICD-10-CM | POA: Diagnosis not present

## 2016-09-30 DIAGNOSIS — I679 Cerebrovascular disease, unspecified: Secondary | ICD-10-CM | POA: Diagnosis not present

## 2016-09-30 DIAGNOSIS — J449 Chronic obstructive pulmonary disease, unspecified: Secondary | ICD-10-CM | POA: Diagnosis not present

## 2016-09-30 DIAGNOSIS — C3490 Malignant neoplasm of unspecified part of unspecified bronchus or lung: Secondary | ICD-10-CM | POA: Diagnosis not present

## 2016-09-30 DIAGNOSIS — I25119 Atherosclerotic heart disease of native coronary artery with unspecified angina pectoris: Secondary | ICD-10-CM | POA: Diagnosis not present

## 2016-09-30 DIAGNOSIS — I4891 Unspecified atrial fibrillation: Secondary | ICD-10-CM | POA: Diagnosis not present

## 2016-10-01 ENCOUNTER — Observation Stay (HOSPITAL_COMMUNITY)

## 2016-10-01 ENCOUNTER — Encounter (HOSPITAL_COMMUNITY): Payer: Self-pay | Admitting: Emergency Medicine

## 2016-10-01 ENCOUNTER — Inpatient Hospital Stay (HOSPITAL_COMMUNITY)
Admission: EM | Admit: 2016-10-01 | Discharge: 2016-10-05 | DRG: 065 | Disposition: A | Discharge status: Skilled Nursing Facility | Attending: Internal Medicine | Admitting: Internal Medicine

## 2016-10-01 ENCOUNTER — Emergency Department (HOSPITAL_COMMUNITY)

## 2016-10-01 DIAGNOSIS — J189 Pneumonia, unspecified organism: Secondary | ICD-10-CM | POA: Diagnosis not present

## 2016-10-01 DIAGNOSIS — R748 Abnormal levels of other serum enzymes: Secondary | ICD-10-CM | POA: Diagnosis present

## 2016-10-01 DIAGNOSIS — E785 Hyperlipidemia, unspecified: Secondary | ICD-10-CM | POA: Diagnosis present

## 2016-10-01 DIAGNOSIS — R29818 Other symptoms and signs involving the nervous system: Secondary | ICD-10-CM | POA: Diagnosis not present

## 2016-10-01 DIAGNOSIS — E1151 Type 2 diabetes mellitus with diabetic peripheral angiopathy without gangrene: Secondary | ICD-10-CM | POA: Diagnosis present

## 2016-10-01 DIAGNOSIS — Z8673 Personal history of transient ischemic attack (TIA), and cerebral infarction without residual deficits: Secondary | ICD-10-CM

## 2016-10-01 DIAGNOSIS — K922 Gastrointestinal hemorrhage, unspecified: Secondary | ICD-10-CM | POA: Diagnosis present

## 2016-10-01 DIAGNOSIS — E1136 Type 2 diabetes mellitus with diabetic cataract: Secondary | ICD-10-CM | POA: Diagnosis present

## 2016-10-01 DIAGNOSIS — Z515 Encounter for palliative care: Secondary | ICD-10-CM | POA: Diagnosis present

## 2016-10-01 DIAGNOSIS — I25119 Atherosclerotic heart disease of native coronary artery with unspecified angina pectoris: Secondary | ICD-10-CM | POA: Diagnosis not present

## 2016-10-01 DIAGNOSIS — I679 Cerebrovascular disease, unspecified: Secondary | ICD-10-CM | POA: Diagnosis not present

## 2016-10-01 DIAGNOSIS — Z955 Presence of coronary angioplasty implant and graft: Secondary | ICD-10-CM

## 2016-10-01 DIAGNOSIS — M109 Gout, unspecified: Secondary | ICD-10-CM | POA: Diagnosis present

## 2016-10-01 DIAGNOSIS — H9190 Unspecified hearing loss, unspecified ear: Secondary | ICD-10-CM | POA: Diagnosis present

## 2016-10-01 DIAGNOSIS — Z66 Do not resuscitate: Secondary | ICD-10-CM | POA: Diagnosis present

## 2016-10-01 DIAGNOSIS — Z91041 Radiographic dye allergy status: Secondary | ICD-10-CM

## 2016-10-01 DIAGNOSIS — E039 Hypothyroidism, unspecified: Secondary | ICD-10-CM | POA: Diagnosis present

## 2016-10-01 DIAGNOSIS — Z9841 Cataract extraction status, right eye: Secondary | ICD-10-CM

## 2016-10-01 DIAGNOSIS — I252 Old myocardial infarction: Secondary | ICD-10-CM

## 2016-10-01 DIAGNOSIS — Z95828 Presence of other vascular implants and grafts: Secondary | ICD-10-CM

## 2016-10-01 DIAGNOSIS — C3491 Malignant neoplasm of unspecified part of right bronchus or lung: Secondary | ICD-10-CM | POA: Diagnosis present

## 2016-10-01 DIAGNOSIS — R7989 Other specified abnormal findings of blood chemistry: Secondary | ICD-10-CM

## 2016-10-01 DIAGNOSIS — Z87891 Personal history of nicotine dependence: Secondary | ICD-10-CM

## 2016-10-01 DIAGNOSIS — Z8551 Personal history of malignant neoplasm of bladder: Secondary | ICD-10-CM

## 2016-10-01 DIAGNOSIS — I6330 Cerebral infarction due to thrombosis of unspecified cerebral artery: Secondary | ICD-10-CM | POA: Diagnosis present

## 2016-10-01 DIAGNOSIS — G8191 Hemiplegia, unspecified affecting right dominant side: Secondary | ICD-10-CM | POA: Diagnosis present

## 2016-10-01 DIAGNOSIS — I6523 Occlusion and stenosis of bilateral carotid arteries: Secondary | ICD-10-CM | POA: Diagnosis present

## 2016-10-01 DIAGNOSIS — R531 Weakness: Secondary | ICD-10-CM | POA: Diagnosis not present

## 2016-10-01 DIAGNOSIS — R29703 NIHSS score 3: Secondary | ICD-10-CM | POA: Diagnosis present

## 2016-10-01 DIAGNOSIS — Z9350 Unspecified cystostomy status: Secondary | ICD-10-CM

## 2016-10-01 DIAGNOSIS — C3490 Malignant neoplasm of unspecified part of unspecified bronchus or lung: Secondary | ICD-10-CM | POA: Diagnosis not present

## 2016-10-01 DIAGNOSIS — I4891 Unspecified atrial fibrillation: Secondary | ICD-10-CM | POA: Diagnosis not present

## 2016-10-01 DIAGNOSIS — Z8711 Personal history of peptic ulcer disease: Secondary | ICD-10-CM

## 2016-10-01 DIAGNOSIS — Z9221 Personal history of antineoplastic chemotherapy: Secondary | ICD-10-CM

## 2016-10-01 DIAGNOSIS — I6789 Other cerebrovascular disease: Secondary | ICD-10-CM | POA: Diagnosis not present

## 2016-10-01 DIAGNOSIS — Z803 Family history of malignant neoplasm of breast: Secondary | ICD-10-CM

## 2016-10-01 DIAGNOSIS — K5900 Constipation, unspecified: Secondary | ICD-10-CM | POA: Diagnosis present

## 2016-10-01 DIAGNOSIS — Z79899 Other long term (current) drug therapy: Secondary | ICD-10-CM

## 2016-10-01 DIAGNOSIS — J449 Chronic obstructive pulmonary disease, unspecified: Secondary | ICD-10-CM | POA: Diagnosis not present

## 2016-10-01 DIAGNOSIS — Z973 Presence of spectacles and contact lenses: Secondary | ICD-10-CM

## 2016-10-01 DIAGNOSIS — H409 Unspecified glaucoma: Secondary | ICD-10-CM | POA: Diagnosis present

## 2016-10-01 DIAGNOSIS — Z961 Presence of intraocular lens: Secondary | ICD-10-CM | POA: Diagnosis present

## 2016-10-01 DIAGNOSIS — I70213 Atherosclerosis of native arteries of extremities with intermittent claudication, bilateral legs: Secondary | ICD-10-CM | POA: Diagnosis present

## 2016-10-01 DIAGNOSIS — Z79890 Hormone replacement therapy (postmenopausal): Secondary | ICD-10-CM

## 2016-10-01 DIAGNOSIS — I5042 Chronic combined systolic (congestive) and diastolic (congestive) heart failure: Secondary | ICD-10-CM | POA: Diagnosis present

## 2016-10-01 DIAGNOSIS — I639 Cerebral infarction, unspecified: Secondary | ICD-10-CM | POA: Diagnosis not present

## 2016-10-01 DIAGNOSIS — I82442 Acute embolism and thrombosis of left tibial vein: Secondary | ICD-10-CM | POA: Diagnosis present

## 2016-10-01 DIAGNOSIS — I130 Hypertensive heart and chronic kidney disease with heart failure and stage 1 through stage 4 chronic kidney disease, or unspecified chronic kidney disease: Secondary | ICD-10-CM | POA: Diagnosis present

## 2016-10-01 DIAGNOSIS — R29702 NIHSS score 2: Secondary | ICD-10-CM | POA: Diagnosis present

## 2016-10-01 DIAGNOSIS — Z8249 Family history of ischemic heart disease and other diseases of the circulatory system: Secondary | ICD-10-CM

## 2016-10-01 DIAGNOSIS — I638 Other cerebral infarction: Secondary | ICD-10-CM | POA: Diagnosis not present

## 2016-10-01 DIAGNOSIS — L659 Nonscarring hair loss, unspecified: Secondary | ICD-10-CM | POA: Diagnosis present

## 2016-10-01 DIAGNOSIS — F419 Anxiety disorder, unspecified: Secondary | ICD-10-CM | POA: Diagnosis present

## 2016-10-01 DIAGNOSIS — D6859 Other primary thrombophilia: Secondary | ICD-10-CM | POA: Diagnosis present

## 2016-10-01 DIAGNOSIS — R05 Cough: Secondary | ICD-10-CM | POA: Diagnosis present

## 2016-10-01 DIAGNOSIS — E1159 Type 2 diabetes mellitus with other circulatory complications: Secondary | ICD-10-CM | POA: Diagnosis not present

## 2016-10-01 DIAGNOSIS — N184 Chronic kidney disease, stage 4 (severe): Secondary | ICD-10-CM | POA: Diagnosis present

## 2016-10-01 DIAGNOSIS — I350 Nonrheumatic aortic (valve) stenosis: Secondary | ICD-10-CM | POA: Diagnosis present

## 2016-10-01 DIAGNOSIS — Z7982 Long term (current) use of aspirin: Secondary | ICD-10-CM

## 2016-10-01 DIAGNOSIS — I480 Paroxysmal atrial fibrillation: Secondary | ICD-10-CM | POA: Diagnosis present

## 2016-10-01 DIAGNOSIS — I255 Ischemic cardiomyopathy: Secondary | ICD-10-CM | POA: Diagnosis present

## 2016-10-01 DIAGNOSIS — E1169 Type 2 diabetes mellitus with other specified complication: Secondary | ICD-10-CM | POA: Diagnosis present

## 2016-10-01 DIAGNOSIS — M1990 Unspecified osteoarthritis, unspecified site: Secondary | ICD-10-CM | POA: Diagnosis present

## 2016-10-01 DIAGNOSIS — Z882 Allergy status to sulfonamides status: Secondary | ICD-10-CM

## 2016-10-01 DIAGNOSIS — Z7902 Long term (current) use of antithrombotics/antiplatelets: Secondary | ICD-10-CM

## 2016-10-01 DIAGNOSIS — Z831 Family history of other infectious and parasitic diseases: Secondary | ICD-10-CM

## 2016-10-01 DIAGNOSIS — Z888 Allergy status to other drugs, medicaments and biological substances status: Secondary | ICD-10-CM

## 2016-10-01 DIAGNOSIS — R197 Diarrhea, unspecified: Secondary | ICD-10-CM | POA: Diagnosis present

## 2016-10-01 DIAGNOSIS — I2510 Atherosclerotic heart disease of native coronary artery without angina pectoris: Secondary | ICD-10-CM | POA: Diagnosis present

## 2016-10-01 DIAGNOSIS — E1122 Type 2 diabetes mellitus with diabetic chronic kidney disease: Secondary | ICD-10-CM | POA: Diagnosis present

## 2016-10-01 DIAGNOSIS — R778 Other specified abnormalities of plasma proteins: Secondary | ICD-10-CM

## 2016-10-01 DIAGNOSIS — Z9842 Cataract extraction status, left eye: Secondary | ICD-10-CM

## 2016-10-01 DIAGNOSIS — Z794 Long term (current) use of insulin: Secondary | ICD-10-CM

## 2016-10-01 LAB — COMPREHENSIVE METABOLIC PANEL
ALBUMIN: 3.3 g/dL — AB (ref 3.5–5.0)
ALK PHOS: 94 U/L (ref 38–126)
ALT: 14 U/L (ref 14–54)
AST: 25 U/L (ref 15–41)
Anion gap: 10 (ref 5–15)
BUN: 22 mg/dL — AB (ref 6–20)
CALCIUM: 9.5 mg/dL (ref 8.9–10.3)
CHLORIDE: 107 mmol/L (ref 101–111)
CO2: 20 mmol/L — AB (ref 22–32)
CREATININE: 1.81 mg/dL — AB (ref 0.44–1.00)
GFR calc non Af Amer: 25 mL/min — ABNORMAL LOW (ref >60)
GFR, EST AFRICAN AMERICAN: 28 mL/min — AB (ref >60)
GLUCOSE: 240 mg/dL — AB (ref 65–99)
Potassium: 5 mmol/L (ref 3.5–5.1)
SODIUM: 137 mmol/L (ref 135–145)
Total Bilirubin: 0.7 mg/dL (ref 0.3–1.2)
Total Protein: 6.7 g/dL (ref 6.5–8.1)

## 2016-10-01 LAB — DIFFERENTIAL
BASOS ABS: 0 10*3/uL (ref 0.0–0.1)
Basophils Relative: 0 %
Eosinophils Absolute: 0.2 10*3/uL (ref 0.0–0.7)
Eosinophils Relative: 3 %
LYMPHS PCT: 23 %
Lymphs Abs: 1.7 10*3/uL (ref 0.7–4.0)
MONO ABS: 0.8 10*3/uL (ref 0.1–1.0)
Monocytes Relative: 11 %
NEUTROS ABS: 4.9 10*3/uL (ref 1.7–7.7)
Neutrophils Relative %: 63 %

## 2016-10-01 LAB — CBC
HCT: 38.6 % (ref 36.0–46.0)
Hemoglobin: 12.1 g/dL (ref 12.0–15.0)
MCH: 30.1 pg (ref 26.0–34.0)
MCHC: 31.3 g/dL (ref 30.0–36.0)
MCV: 96 fL (ref 78.0–100.0)
PLATELETS: 179 10*3/uL (ref 150–400)
RBC: 4.02 MIL/uL (ref 3.87–5.11)
RDW: 16.5 % — ABNORMAL HIGH (ref 11.5–15.5)
WBC: 7.7 10*3/uL (ref 4.0–10.5)

## 2016-10-01 LAB — I-STAT CHEM 8, ED
BUN: 25 mg/dL — AB (ref 6–20)
CALCIUM ION: 1.2 mmol/L (ref 1.15–1.40)
CHLORIDE: 105 mmol/L (ref 101–111)
Creatinine, Ser: 1.7 mg/dL — ABNORMAL HIGH (ref 0.44–1.00)
GLUCOSE: 231 mg/dL — AB (ref 65–99)
HCT: 40 % (ref 36.0–46.0)
Hemoglobin: 13.6 g/dL (ref 12.0–15.0)
POTASSIUM: 4.9 mmol/L (ref 3.5–5.1)
SODIUM: 139 mmol/L (ref 135–145)
TCO2: 21 mmol/L (ref 0–100)

## 2016-10-01 LAB — GLUCOSE, CAPILLARY: GLUCOSE-CAPILLARY: 212 mg/dL — AB (ref 65–99)

## 2016-10-01 LAB — PROTIME-INR
INR: 1
PROTHROMBIN TIME: 13.2 s (ref 11.4–15.2)

## 2016-10-01 LAB — I-STAT TROPONIN, ED: Troponin i, poc: 0.19 ng/mL (ref 0.00–0.08)

## 2016-10-01 LAB — CBG MONITORING, ED: GLUCOSE-CAPILLARY: 215 mg/dL — AB (ref 65–99)

## 2016-10-01 LAB — TROPONIN I: Troponin I: 0.25 ng/mL (ref <0.03)

## 2016-10-01 LAB — APTT: APTT: 29 s (ref 24–36)

## 2016-10-01 LAB — ETHANOL: Alcohol, Ethyl (B): <5 mg/dL (ref <5)

## 2016-10-01 MED ORDER — SENNOSIDES-DOCUSATE SODIUM 8.6-50 MG PO TABS
1.0000 | ORAL_TABLET | Freq: Every evening | ORAL | Status: DC | PRN
  Administered 2016-10-04: 1 via ORAL
  Filled 2016-10-01: qty 1

## 2016-10-01 MED ORDER — BOOST / RESOURCE BREEZE PO LIQD
1.0000 | Freq: Three times a day (TID) | ORAL | Status: DC
  Administered 2016-10-02 – 2016-10-03 (×5): 1 via ORAL
  Filled 2016-10-01 (×3): qty 1

## 2016-10-01 MED ORDER — ATORVASTATIN CALCIUM 80 MG PO TABS
80.0000 mg | ORAL_TABLET | Freq: Every day | ORAL | Status: DC
  Administered 2016-10-01 – 2016-10-04 (×4): 80 mg via ORAL
  Filled 2016-10-01 (×4): qty 1

## 2016-10-01 MED ORDER — INSULIN ASPART 100 UNIT/ML ~~LOC~~ SOLN
0.0000 [IU] | Freq: Three times a day (TID) | SUBCUTANEOUS | Status: DC
  Administered 2016-10-02 (×2): 3 [IU] via SUBCUTANEOUS
  Administered 2016-10-02 – 2016-10-03 (×2): 2 [IU] via SUBCUTANEOUS
  Administered 2016-10-04: 1 [IU] via SUBCUTANEOUS
  Administered 2016-10-04: 2 [IU] via SUBCUTANEOUS
  Administered 2016-10-05 (×2): 1 [IU] via SUBCUTANEOUS

## 2016-10-01 MED ORDER — ACETAMINOPHEN 160 MG/5ML PO SOLN: 650.0000 mg | ORAL | Status: DC | PRN

## 2016-10-01 MED ORDER — MIRTAZAPINE 15 MG PO TABS: 30.0000 mg | ORAL_TABLET | Freq: Every day | ORAL | Status: DC

## 2016-10-01 MED ORDER — STROKE: EARLY STAGES OF RECOVERY BOOK
Freq: Once | Status: AC
  Administered 2016-10-01: 1

## 2016-10-01 MED ORDER — PANTOPRAZOLE SODIUM 40 MG PO TBEC
40.0000 mg | DELAYED_RELEASE_TABLET | Freq: Two times a day (BID) | ORAL | Status: DC
  Administered 2016-10-01 – 2016-10-05 (×8): 40 mg via ORAL
  Filled 2016-10-01 (×8): qty 1

## 2016-10-01 MED ORDER — HYDROCODONE-HOMATROPINE 5-1.5 MG/5ML PO SYRP: 2.5000 mL | ORAL_SOLUTION | ORAL | Status: DC | PRN

## 2016-10-01 MED ORDER — LEVOTHYROXINE SODIUM 50 MCG PO TABS
50.0000 ug | ORAL_TABLET | Freq: Every day | ORAL | Status: DC
  Administered 2016-10-02 – 2016-10-05 (×4): 50 ug via ORAL
  Filled 2016-10-01 (×4): qty 1

## 2016-10-01 MED ORDER — CITALOPRAM HYDROBROMIDE 10 MG PO TABS
20.0000 mg | ORAL_TABLET | Freq: Every day | ORAL | Status: DC
  Administered 2016-10-01 – 2016-10-04 (×4): 20 mg via ORAL
  Filled 2016-10-01 (×4): qty 2

## 2016-10-01 MED ORDER — ASPIRIN 325 MG PO TABS
325.0000 mg | ORAL_TABLET | Freq: Every day | ORAL | Status: DC
  Administered 2016-10-01 – 2016-10-02 (×2): 325 mg via ORAL
  Filled 2016-10-01 (×2): qty 1

## 2016-10-01 MED ORDER — ENOXAPARIN SODIUM 30 MG/0.3ML ~~LOC~~ SOLN
30.0000 mg | SUBCUTANEOUS | Status: DC
  Administered 2016-10-01: 30 mg via SUBCUTANEOUS
  Filled 2016-10-01 (×2): qty 0.3

## 2016-10-01 MED ORDER — INSULIN ASPART 100 UNIT/ML ~~LOC~~ SOLN
0.0000 [IU] | Freq: Every day | SUBCUTANEOUS | Status: DC
  Administered 2016-10-01: 2 [IU] via SUBCUTANEOUS
  Administered 2016-10-03: 3 [IU] via SUBCUTANEOUS

## 2016-10-01 MED ORDER — ENSURE ENLIVE PO LIQD
237.0000 mL | Freq: Two times a day (BID) | ORAL | Status: DC
  Administered 2016-10-02 – 2016-10-03 (×3): 237 mL via ORAL

## 2016-10-01 MED ORDER — ACETAMINOPHEN 650 MG RE SUPP: 650.0000 mg | RECTAL | Status: DC | PRN

## 2016-10-01 MED ORDER — NITROGLYCERIN 0.4 MG SL SUBL: 0.4000 mg | SUBLINGUAL_TABLET | SUBLINGUAL | Status: DC | PRN

## 2016-10-01 MED ORDER — INSULIN GLARGINE 100 UNIT/ML ~~LOC~~ SOLN
12.0000 [IU] | Freq: Every day | SUBCUTANEOUS | Status: DC
  Administered 2016-10-02: 12 [IU] via SUBCUTANEOUS
  Filled 2016-10-01: qty 0.12

## 2016-10-01 MED ORDER — LATANOPROST 0.005 % OP SOLN
1.0000 [drp] | Freq: Every day | OPHTHALMIC | Status: DC
  Administered 2016-10-01 – 2016-10-04 (×4): 1 [drp] via OPHTHALMIC
  Filled 2016-10-01 (×2): qty 2.5

## 2016-10-01 MED ORDER — BRINZOLAMIDE 1 % OP SUSP
1.0000 [drp] | Freq: Two times a day (BID) | OPHTHALMIC | Status: DC
  Administered 2016-10-01 – 2016-10-05 (×8): 1 [drp] via OPHTHALMIC
  Filled 2016-10-01: qty 10

## 2016-10-01 MED ORDER — SODIUM CHLORIDE 0.9 % IV SOLN
INTRAVENOUS | Status: DC
  Administered 2016-10-02: 01:00:00 via INTRAVENOUS

## 2016-10-01 MED ORDER — INSULIN GLARGINE 100 UNITS/ML SOLOSTAR PEN: 12.0000 [IU] | PEN_INJECTOR | Freq: Every day | SUBCUTANEOUS | Status: DC

## 2016-10-01 MED ORDER — ACETAMINOPHEN 325 MG PO TABS
650.0000 mg | ORAL_TABLET | ORAL | Status: DC | PRN
  Administered 2016-10-03 – 2016-10-05 (×2): 650 mg via ORAL
  Filled 2016-10-01 (×3): qty 2

## 2016-10-01 MED ORDER — FOLIC ACID 1 MG PO TABS
1.0000 mg | ORAL_TABLET | Freq: Every day | ORAL | Status: DC
  Administered 2016-10-02 – 2016-10-05 (×4): 1 mg via ORAL
  Filled 2016-10-01 (×4): qty 1

## 2016-10-01 MED ORDER — CALCITRIOL 0.25 MCG PO CAPS
0.2500 ug | ORAL_CAPSULE | ORAL | Status: DC
Start: 2016-10-01 — End: 2016-10-05
  Administered 2016-10-01 – 2016-10-03 (×2): 0.25 ug via ORAL
  Filled 2016-10-01 (×2): qty 1

## 2016-10-01 NOTE — ED Triage Notes (Signed)
Pt to ED via [facility] from home.  Reports 3 BMs today and just prior to last one (around 12pm) pt began having R arm weakness and abnormal gait.  Daughter arrived home this afternoon and EMS called. Stroke team met pt at bridge and pt to CT with RN and stroke team.

## 2016-10-01 NOTE — Code Documentation (Addendum)
83yo female arriving to [facility] at 17 via [facility].  Patient from home where she noticed right arm weakness at 1200.  Patient reports being constipated, taking Colace and experiencing diarrhea x 2. Patient then need to go to the bathroom a third time and noticed the right arm weakness at that time.  She denies having any difficulty prior to this.  EMS called and activated a code stroke.  Stroke team at the bedside on patient arrival.  Labs drawn and patient to CT with team.  CT completed.  NIHSS 2, see documentation for details and code stroke times.  Patient stated the month was June and has RUE ataxia on exam.  Dr. Cheral Marker at the bedside.  No treatment with tPA at this time.  Of note, patient with hospice support.  Patient to be admitted.  Bedside handoff with ED RN Santiago Glad.

## 2016-10-01 NOTE — ED Notes (Signed)
EDP at bedside  

## 2016-10-01 NOTE — ED Provider Notes (Signed)
[facility] DEPT Provider Note   CSN: [id number] Arrival date & time: 10/01/16  1559     History   Chief Complaint Chief Complaint  Patient presents with  . Code Stroke    HPI Erica Jimenez is a 83 y.o. female.  HPI 83 year old female with medical history significant for lung cancer, remote hx of bladder CA, diabetes mellitus, hypertension, chronic systolic heart failure, CKD, CVA and recent admission for GI bleed and sepsis comes in with R sided weakness. Pt reports that she woke up doing well, and noted around noon that her R arm was not cooperating. Pt has no numbness, tingling. Pt denies any vision change, dizziness, any any abnormality in her legs.  ROS is + for dib. Pt is noted to have elevated troponin, but she denies chest pain. DIB is not new. No hx of DVT.   Past Medical History:  Diagnosis Date  . A-V fistula ([place])    pt has 2  fistulas in rt arm  . Alopecia   . Anemia   . Anxiety   . Aortic stenosis   . Arthritis    gout  . Bladder cancer [facility])     s/p resection and BCG treatment.   Marland Kitchen CAD (coronary artery disease)     Pt is s/p anterior MI in 1996 with stent placed in LAD.  Last myoview in our office was in 7/05 and  showed apical anterior, apical, and apical inferior infarct.  Minimal peri-infarct ischemia.  EF 49%.   . Cardiomyopathy, ischemic   . CKD (chronic kidney disease)     Pt is stage III-IV.  She had a fistula placed but is not on dialysis.  She follows with Dr. Lorrene Reid for  nephrology.   Marland Kitchen COPD (chronic obstructive pulmonary disease) ([place])   . CVA (cerebral vascular accident) ([place])   . DM (diabetes mellitus) ([place])   . Full dentures   . Gout   . History of glaucoma   . HOH (hard of hearing)    slightly  . HTN (hypertension)   . Hyperlipidemia   . Hypothyroidism   . Myocardial infarction ([place])   . PAD (peripheral artery disease) ([facility])     arterial doppler study 12/04 suggestive of > 50% bilateral SFA stenosis.  Pt has mild claudication.  No  invasive evaluation given CKD and desire to avoid contrast use.   Marland Kitchen Shortness of breath dyspnea    on exertion  . Wears glasses     Patient Active Problem List   Diagnosis Date Noted  . Cerebral thrombosis with cerebral infarction 10/01/2016  . Right sided weakness 10/01/2016  . Bleeding acute gastric ulcer   . SOB (shortness of breath)   . Encounter for palliative care   . Malnutrition of moderate degree 09/02/2016  . Acute upper GI bleed   . Duodenal ulcer with hemorrhage   . Sepsis ([place]) 09/01/2016  . Goals of care, counseling/discussion 08/26/2016  . Acute cerebrovascular accident (CVA) ([place]) 08/16/2016  . Type 2 diabetes mellitus with hyperlipidemia ([place]) 08/16/2016  . CKD (chronic kidney disease), stage IV ([place]) 08/16/2016  . Anemia 08/16/2016  . Stage IV squamous cell carcinoma of right lung ([place]) 08/14/2016  . Lung cancer ([place]) 08/12/2016  . Right lower lobe lung mass 07/21/2016  . Smoking 08/30/2010  . Aortic valve disorder 02/12/2009  . CARDIOMYOPATHY, ISCHEMIC 08/14/2008  . Hyperlipidemia 07/11/2008  . CAD, NATIVE VESSEL 07/11/2008  . BRADYCARDIA 07/11/2008  . Peripheral vascular disease ([place]) 07/11/2008  .  CARDIAC MURMUR 07/11/2008  . CAROTID BRUIT 07/11/2008    Past Surgical History:  Procedure Laterality Date  . ABDOMINAL AORTAGRAM N/A 08/17/2013   Procedure: ABDOMINAL Maxcine Ham;  Surgeon: Wellington Hampshire, MD;  Location: [facility] CATH LAB;  Service: Cardiovascular;  Laterality: N/A;  . ANGIOPLASTY    . CYSTOSCOPY/RETROGRADE/URETEROSCOPY Bilateral 01/09/2015   Procedure: CYSTOSCOPY BILATERAL RETROGRADE PYELOGRAM, WASHINGS RENAL PELVIS & BLADDER ;  Surgeon: Lowella Bandy, MD;  Location[facility];  Service: Urology;  Laterality: Bilateral;  . CYSTOSTOMY W/ BLADDER BIOPSY    . ESOPHAGOGASTRODUODENOSCOPY N/A 09/02/2016   Procedure: ESOPHAGOGASTRODUODENOSCOPY (EGD);  Surgeon: Jerene Bears, MD;  Location: [facility];  Service: Endoscopy;  Laterality: N/A;  .  EYE SURGERY Bilateral    cataract w IOL    OB History    No data available       Home Medications    Prior to Admission medications   Medication Sig Start Date End Date Taking? Authorizing Provider  aspirin 325 MG tablet Take 1 tablet (325 mg total) by mouth daily. 08/19/16   Rosita Fire, MD  atorvastatin (LIPITOR) 80 MG tablet Take 80 mg by mouth daily after supper.     [provider]  bisoprolol (ZEBETA) 5 MG tablet take 1/2 tablet by mouth once daily **PLEASE KEEP UPCOMING APPOINTMENT FOR FURTHER REFILLS** 08/19/16   Larey Dresser, MD  brinzolamide (AZOPT) 1 % ophthalmic suspension Place 1 drop into both eyes 2 (two) times daily.     [provider]  calcitRIOL (ROCALTROL) 0.25 MCG capsule Take 0.25 mcg by mouth every Monday, Wednesday, and Friday.     [provider]  Carboxymethylcellulose Sodium (REFRESH TEARS OP) Place 1 drop into both eyes 3 (three) times daily as needed (dry eyes). Both eyes.    [provider]  citalopram (CELEXA) 10 MG tablet Take 2 tablets (20 mg total) by mouth daily after supper. 09/08/16   Patrecia Pour, Christean Grief, MD  febuxostat (ULORIC) 40 MG tablet Take 40 mg by mouth daily.    [provider]  feeding supplement (BOOST / RESOURCE BREEZE) LIQD Take 1 Container by mouth 3 (three) times daily between meals. 09/08/16   Doreatha Lew, MD  feeding supplement, ENSURE ENLIVE, (ENSURE ENLIVE) LIQD Take 237 mLs by mouth 2 (two) times daily between meals. 09/09/16   Doreatha Lew, MD  Ferrous Gluconate (FERGON PO) Take 1 tablet by mouth daily after supper.     [provider]  folic acid (FOLVITE) 1 MG tablet Take 1 mg by mouth daily.    [provider]  furosemide (LASIX) 40 MG tablet Take 40 mg by mouth daily.     [provider]  hydrALAZINE (APRESOLINE) 50 MG tablet Take 1 tablet (50 mg total) by mouth 2 (two) times daily. 08/11/16   Larey Dresser, MD  HYDROcodone-homatropine  [facility]) 5-1.5 MG/5ML syrup Take 2.5 mLs by mouth every 4 (four) hours as needed for cough. 09/08/16   Doreatha Lew, MD  insulin glargine (LANTUS) 100 unit/mL SOPN Inject 12 Units into the skin daily before breakfast.    [provider]  insulin lispro (HUMALOG KWIKPEN) 100 UNIT/ML KiwkPen Inject 2-3 Units into the skin 3 (three) times daily before meals.    [provider]  isosorbide mononitrate (IMDUR) 60 MG 24 hr tablet Take 60 mg by mouth daily after supper.     [provider]  latanoprost (XALATAN) 0.005 % ophthalmic solution Place 1 drop into  both eyes at bedtime.     [provider]  levothyroxine (SYNTHROID, LEVOTHROID) 50 MCG tablet Take 50 mcg by mouth daily after supper.     [provider]  lidocaine-prilocaine (EMLA) cream Apply 1 application topically as needed. Patient taking differently: Apply 1 application topically daily as needed (vaginal itching).  08/14/16   Curt Bears, MD  mirtazapine (REMERON) 30 MG tablet Take 1 tablet (30 mg total) by mouth at bedtime. 09/08/16   Doreatha Lew, MD  nitroGLYCERIN (NITROSTAT) 0.4 MG SL tablet Place 1 tablet (0.4 mg total) under the tongue every 5 (five) minutes as needed for chest pain. 10/26/14   Larey Dresser, MD  pantoprazole (PROTONIX) 40 MG tablet Take 1 tablet (40 mg total) by mouth 2 (two) times daily. 09/08/16   Doreatha Lew, MD  senna (SENOKOT) 8.6 MG tablet Take 1 tablet by mouth daily as needed for constipation.     [provider]    Family History Family History  Problem Relation Age of Onset  . Breast cancer Mother   . Tuberculosis Father   . Heart disease Maternal Aunt     Social History Social History  Substance Use Topics  . Smoking status: Former Smoker    Packs/day: 0.35    Years: 60.00    Types: Cigarettes    Quit date: 06/30/2016  . Smokeless tobacco: Never Used  . Alcohol use 0.6 oz/week    1 Shots of liquor per week     Comment:  occasional     Allergies   Contrast media [iodinated diagnostic agents]; Fluoxetine; and Sulfonamide derivatives   Review of Systems Review of Systems  Constitutional: Positive for activity change.  Respiratory: Positive for shortness of breath. Negative for chest tightness.   Cardiovascular: Negative for chest pain.  Gastrointestinal: Negative for abdominal pain.  Neurological: Positive for weakness.  All other systems reviewed and are negative.    Physical Exam Updated Vital Signs BP (!) 164/69   Pulse 78   Temp 99.1 F (37.3 C) (Oral)   Resp 20   SpO2 98%   Physical Exam  Constitutional: She is oriented to person, place, and time. She appears well-developed and well-nourished.  HENT:  Head: Normocephalic and atraumatic.  Eyes: EOM are normal. Pupils are equal, round, and reactive to light.  Neck: Neck supple.  Cardiovascular: Normal rate, regular rhythm and normal heart sounds.   Pulmonary/Chest: Effort normal. No respiratory distress.  Abdominal: Soft. She exhibits no distension. There is no tenderness. There is no rebound and no guarding.  Neurological: She is alert and oriented to person, place, and time. No cranial nerve deficit.  RUE strength is 4/5 Sensory exam is normal for upper and lower extremity. Lower extremity strength is normal.  Skin: Skin is warm and dry.  Nursing note and vitals reviewed.    ED Treatments / Results  Labs (all labs ordered are listed, but only abnormal results are displayed) Labs Reviewed  CBC - Abnormal; Notable for the following:       Result Value   RDW 16.5 (*)    All other components within normal limits  COMPREHENSIVE METABOLIC PANEL - Abnormal; Notable for the following:    CO2 20 (*)    Glucose, Bld 240 (*)    BUN 22 (*)    Creatinine, Ser 1.81 (*)    Albumin 3.3 (*)    GFR calc non Af Amer 25 (*)    GFR calc Af Amer 28 (*)  All other components within normal limits  I-STAT CHEM 8, ED - Abnormal; Notable for  the following:    BUN 25 (*)    Creatinine, Ser 1.70 (*)    Glucose, Bld 231 (*)    All other components within normal limits  I-STAT TROPOININ, ED - Abnormal; Notable for the following:    Troponin i, poc 0.19 (*)    All other components within normal limits  CBG MONITORING, ED - Abnormal; Notable for the following:    Glucose-Capillary 215 (*)    All other components within normal limits  ETHANOL  PROTIME-INR  APTT  DIFFERENTIAL  RAPID URINE DRUG SCREEN, HOSP PERFORMED  URINALYSIS, ROUTINE W REFLEX MICROSCOPIC    EKG  EKG Interpretation  Date/Time:  Wednesday Oct 01 2016 16:24:11 EDT Ventricular Rate:  78 PR Interval:    QRS Duration: 119 QT Interval:  496 QTC Calculation: 566 R Axis:   -87 Text Interpretation:  Sinus rhythm Incomplete right bundle branch block Inferior infarct, old No acute changes No significant change since last tracing Confirmed by Varney Biles [phone]) on 10/01/2016 5:09:34 PM       Radiology Ct Head Code Stroke W/o Cm  Result Date: 10/01/2016 CLINICAL DATA:  Code stroke. 83 year old female with right side weakness and abnormal gait. Last seen normal 1230 hours. EXAM: CT HEAD WITHOUT CONTRAST TECHNIQUE: Contiguous axial images were obtained from the base of the skull through the vertex without intravenous contrast. COMPARISON:  Head CT 05/11/2010. Brain MRI and intracranial MRA 08/16/2016 FINDINGS: Brain: Scattered chronic cortical encephalomalacia in the bilateral posterior MCA, anterior left MCA, and right PCA territories. Small chronic infarcts in the left cerebellar hemisphere. No cortically based acute infarct identified. No acute intracranial hemorrhage identified. No midline shift, mass effect, or evidence of intracranial mass lesion. Stable ventricle size and configuration. Vascular: Extensive Calcified atherosclerosis at the skull base. No suspicious intracranial vascular hyperdensity. Skull: No acute osseous abnormality identified. Hyperostosis  of the calvarium, normal variant. Sinuses/Orbits: Visualized paranasal sinuses and mastoids are stable and well pneumatized. Other: No acute orbit or scalp soft tissue findings. ASPECTS [facility] Stroke Program Early CT Score) - Ganglionic level infarction (caudate, lentiform nuclei, internal capsule, insula, M1-M3 cortex): 7 - Supraganglionic infarction (M4-M6 cortex): 3 Total score (0-10 with 10 being normal): 10 IMPRESSION: 1. Bilateral chronic ischemic disease appears stable since 08/16/2016. No acute cortically based infarct or acute intracranial hemorrhage identified. 2. ASPECTS is 10. 3. The above was relayed via text pager to E. Lindzen on 10/01/2016 at 16:19 . Electronically Signed   By: Genevie Ann M.D.   On: 10/01/2016 16:19    Procedures Procedures (including critical care time)  CRITICAL CARE Performed by: Varney Biles   Total critical care time: 33 minutes for acute stroke, elevated troponin that is undifferentiated.  Critical care time was exclusive of separately billable procedures and treating other patients.  Critical care was necessary to treat or prevent imminent or life-threatening deterioration.  Critical care was time spent personally by me on the following activities: development of treatment plan with patient and/or surrogate as well as nursing, discussions with consultants, evaluation of patient's response to treatment, examination of patient, obtaining history from patient or surrogate, ordering and performing treatments and interventions, ordering and review of laboratory studies, ordering and review of radiographic studies, pulse oximetry and re-evaluation of patient's condition.   Medications Ordered in ED Medications - No data to display   Initial Impression / Assessment and Plan / ED Course  I  have reviewed the triage vital signs and the nursing notes.  Pertinent labs & imaging results that were available during my care of the patient were reviewed by me and  considered in my medical decision making (see chart for details).     Pt comes in with cc of R sided weakness. PT also noted to have an elevated troponin.  Pt has multiple medical co-morbidities as stated in the HPI. She has R sided weakness, which is likely from acute stroke. Code stroke called when patient arrived and Neuro team has assessed. Pt is not a TPA candidate due to symptoms not being profound and recent hx of GI bleed. Also, pt has troponin elevation. No chest pain and EKG is reassuring. Troponin could be secondary to stroke. We considered PE in the ddx, but there is no new dib and no chest pain or hypoxia. For now, I think serial trops is suffice, but if pt's troponin is rising, and there is hypoxia or worsening dyspnea, she will need PE workup. I discussed those concerns with Dr. Karleen Hampshire. Pt is in Hospice, she is DNR/DNI.  Final Clinical Impressions(s) / ED Diagnoses   Final diagnoses:  Acute ischemic stroke (HCC)  Troponin level elevated    New Prescriptions New Prescriptions   No medications on file     Varney Biles, MD 10/01/16 [phone]

## 2016-10-01 NOTE — Progress Notes (Signed)
[facility] Liaison:  RN visit  Met with patient and visitor in the room.  Initially the visitor identified as daughter, but then said "like a foster daughter".  No other family at bedside.   Patient was alert and oriented x 3.  Patient declined to talk at this time, but did state she doesn't have any needs "right now".   Per Santiago Glad, RN, patient was code stroke, but decided against TPN.  Patient requested full work up, but does not want to change DNR.     Will continue to follow while in the hospital and anticipate needs.  Thank you,  Edyth Gunnels, RN, BSN [facility] Liaison [phone]  All hospital liaisons are now on [place].

## 2016-10-01 NOTE — H&P (Signed)
History and Physical    Erica Jimenez PXT:[id number] DOB: 02-10-1930 DOA: 10/01/2016  PCP: [name] (Inactive)  Patient coming from: Home with Hospice.   I have personally briefly reviewed patient's old medical records in [place]  Chief Complaint: right arm weakness and gait abnormality at 12 pm. Today.   HPI: Erica Jimenez is a 83 y.o. female with medical history significant of  CAD, PVD, HYPERTENSION, CVA, DM, COPD, right side lunc cancer, follows with [name], presents to day to ED as code stroke as she reports right arm weakness and gait abnormality. She noticed the right arm weakness around 12 pm.. She is currently on hospice but wanted to be treated for all treatable conditions. tPA was not given as risk is greater than benefit and pt did not want to go ahead with the tPA. She also reports 3 to 4 bowel movements earlier today. She denies any other complaints.  No chest pain, sob, headache, dizziness, blurry vision, no tingling or numbness, no syncope, fever or chills. Reports some nausea, no vomiting . No hematemesis and no urinary symptoms.   Review of Systems: As per HPI otherwise 10 point review of systems negative.    Past Medical History:  Diagnosis Date  . A-V fistula ([place])    pt has 2  fistulas in rt arm  . Alopecia   . Anemia   . Anxiety   . Aortic stenosis   . Arthritis    gout  . Bladder cancer [facility])     s/p resection and BCG treatment.   Marland Kitchen CAD (coronary artery disease)     Pt is s/p anterior MI in 1996 with stent placed in LAD.  Last myoview in our office was in 7/05 and  showed apical anterior, apical, and apical inferior infarct.  Minimal peri-infarct ischemia.  EF 49%.   . Cardiomyopathy, ischemic   . CKD (chronic kidney disease)     Pt is stage III-IV.  She had a fistula placed but is not on dialysis.  She follows with Dr. Lorrene Reid for  nephrology.   Marland Kitchen COPD (chronic obstructive pulmonary disease) ([place])   . CVA (cerebral vascular accident)  ([place])   . DM (diabetes mellitus) ([place])   . Full dentures   . Gout   . History of glaucoma   . HOH (hard of hearing)    slightly  . HTN (hypertension)   . Hyperlipidemia   . Hypothyroidism   . Myocardial infarction ([place])   . PAD (peripheral artery disease) ([facility])     arterial doppler study 12/04 suggestive of > 50% bilateral SFA stenosis.  Pt has mild claudication.  No invasive evaluation given CKD and desire to avoid contrast use.   Marland Kitchen Shortness of breath dyspnea    on exertion  . Wears glasses     Past Surgical History:  Procedure Laterality Date  . ABDOMINAL AORTAGRAM N/A 08/17/2013   Procedure: ABDOMINAL Maxcine Ham;  Surgeon: Wellington Hampshire, MD;  Location: [facility] CATH [facility];  Service: Cardiovascular;  Laterality: N/A;  . ANGIOPLASTY    . CYSTOSCOPY/RETROGRADE/URETEROSCOPY Bilateral 01/09/2015   Procedure: CYSTOSCOPY BILATERAL RETROGRADE PYELOGRAM, WASHINGS RENAL PELVIS & BLADDER ;  Surgeon: Lowella Bandy, MD;  Location: [facility];  Service: Urology;  Laterality: Bilateral;  . CYSTOSTOMY W/ BLADDER BIOPSY    . ESOPHAGOGASTRODUODENOSCOPY N/A 09/02/2016   Procedure: ESOPHAGOGASTRODUODENOSCOPY (EGD);  Surgeon: Jerene Bears, MD;  Location: [facility];  Service: Endoscopy;  Laterality: N/A;  . EYE SURGERY  Bilateral    cataract w IOL     reports that she quit smoking about 3 months ago. Her smoking use included Cigarettes. She has a 21.00 pack-year smoking history. She has never used smokeless tobacco. She reports that she drinks about 0.6 oz of alcohol per week . She reports that she does not use drugs.  Allergies  Allergen Reactions  . Contrast Media [Iodinated Diagnostic Agents] Other (See Comments)    Renal insufficiency  . Fluoxetine Rash  . Sulfonamide Derivatives Nausea Only    Family History  Problem Relation Age of Onset  . Breast cancer Mother   . Tuberculosis Father   . Heart disease Maternal Aunt    Reviewed.  Prior to Admission medications   Medication  Sig Start Date End Date Taking? Authorizing Provider  aspirin 325 MG tablet Take 1 tablet (325 mg total) by mouth daily. 08/19/16  Yes Rosita Fire, MD  atorvastatin (LIPITOR) 80 MG tablet Take 80 mg by mouth daily after supper.    Yes [provider]  bisoprolol (ZEBETA) 5 MG tablet take 1/2 tablet by mouth once daily **PLEASE KEEP UPCOMING APPOINTMENT FOR FURTHER REFILLS** 08/19/16  Yes Larey Dresser, MD  brinzolamide (AZOPT) 1 % ophthalmic suspension Place 1 drop into both eyes 2 (two) times daily.    Yes [provider]  Carboxymethylcellulose Sodium (REFRESH TEARS OP) Place 1 drop into both eyes 3 (three) times daily as needed (dry eyes). Both eyes.   Yes [provider]  citalopram (CELEXA) 10 MG tablet Take 2 tablets (20 mg total) by mouth daily after supper. 09/08/16  Yes Patrecia Pour, Christean Grief, MD  clopidogrel (PLAVIX) 75 MG tablet Take 75 mg by mouth daily.   Yes [provider]  febuxostat (ULORIC) 40 MG tablet Take 40 mg by mouth daily.   Yes [provider]  feeding supplement (BOOST / RESOURCE BREEZE) LIQD Take 1 Container by mouth 3 (three) times daily between meals. 09/08/16  Yes Doreatha Lew, MD  feeding supplement, ENSURE ENLIVE, (ENSURE ENLIVE) LIQD Take 237 mLs by mouth 2 (two) times daily between meals. 09/09/16  Yes Patrecia Pour, Christean Grief, MD  Ferrous Gluconate (FERGON PO) Take 1 tablet by mouth daily after supper.    Yes [provider]  folic acid (FOLVITE) 1 MG tablet Take 1 mg by mouth daily.   Yes [provider]  furosemide (LASIX) 40 MG tablet Take 40 mg by mouth daily.    Yes [provider]  hydrALAZINE (APRESOLINE) 50 MG tablet Take 1 tablet (50 mg total) by mouth 2 (two) times daily. 08/11/16  Yes Larey Dresser, MD  insulin glargine (LANTUS) 100 unit/mL SOPN Inject 12 Units into the skin daily before breakfast.   Yes [provider]  insulin lispro (HUMALOG KWIKPEN) 100 UNIT/ML  KiwkPen Inject 2-3 Units into the skin 3 (three) times daily before meals.   Yes [provider]  isosorbide mononitrate (IMDUR) 60 MG 24 hr tablet Take 60 mg by mouth daily after supper.    Yes [provider]  latanoprost (XALATAN) 0.005 % ophthalmic solution Place 1 drop into both eyes at bedtime.    Yes [provider]  levothyroxine (SYNTHROID, LEVOTHROID) 50 MCG tablet Take 50 mcg by mouth daily after supper.    Yes [provider]  lidocaine-prilocaine (EMLA) cream Apply 1 application topically as needed. Patient taking differently: Apply 1 application topically daily as needed (vaginal itching).  08/14/16  Yes Curt Bears, MD  LORazepam (ATIVAN) 0.5 MG tablet Take 0.5 mg by mouth every 4 (four) hours as needed for anxiety (short of breath).   Yes [provider]  nitroGLYCERIN (NITROSTAT) 0.4 MG SL tablet Place 1 tablet (0.4 mg total) under the tongue every 5 (five) minutes as needed for chest pain. 10/26/14  Yes Larey Dresser, MD  pantoprazole (PROTONIX) 40 MG tablet Take 1 tablet (40 mg total) by mouth 2 (two) times daily. 09/08/16  Yes Patrecia Pour, Christean Grief, MD  potassium chloride SA (K-DUR,KLOR-CON) 20 MEQ tablet Take 20 mEq by mouth 2 (two) times daily.   Yes [provider]    Physical Exam: Vitals:   10/01/16 1736 10/01/16 1830 10/01/16 1835 10/01/16 1904  BP:  102/86  128/83  Pulse: 78 78  77  Resp: 20 20  16   Temp:   98.8 F (37.1 C) 98.6 F (37 C)  TempSrc:    Oral  SpO2: 98% 100%  95%    Constitutional: NAD, calm, comfortable Vitals:   10/01/16 1736 10/01/16 1830 10/01/16 1835 10/01/16 1904  BP:  102/86  128/83  Pulse: 78 78  77  Resp: 20 20  16   Temp:   98.8 F (37.1 C) 98.6 F (37 C)  TempSrc:    Oral  SpO2: 98% 100%  95%   Eyes: PERRL, lids and conjunctivae normal ENMT: Mucous membranes are moist. Posterior pharynx clear of any exudate or lesions.Normal dentition.  Neck: normal, supple, no masses, no  thyromegaly Respiratory: clear to auscultation bilaterally, no wheezing, no crackles. Normal respiratory effort. No accessory muscle use.  Cardiovascular: Regular rate and rhythm, no murmurs / rubs / gallops. No extremity edema. 2+ pedal pulses. No carotid bruits.  Abdomen: no tenderness, no masses palpated. No hepatosplenomegaly. Bowel sounds positive.  Musculoskeletal: no clubbing / cyanosis. No joint deformity upper and lower extremities. Good ROM, no contractures. Normal muscle tone.  Skin: no rashes, lesions, ulcers. No induration Neurologic: CN 2-12 grossly intact. Sensation intact,   Right upper extremity strength 3/5. Rest of the extremity strength is 5/5. Speech normal.  Gait not tested.  Psychiatric: Normal judgment and insight. Alert and oriented x 3. Normal mood.     Labs on Admission: I have personally reviewed following labs and imaging studies  CBC:  Recent Labs Lab 10/01/16 1602 10/01/16 1610  WBC 7.7  --   NEUTROABS 4.9  --   HGB 12.1 13.6  HCT 38.6 40.0  MCV 96.0  --   PLT 179  --    Basic Metabolic Panel:  Recent Labs Lab 10/01/16 1602 10/01/16 1610  NA 137 139  K 5.0 4.9  CL 107 105  CO2 20*  --   GLUCOSE 240* 231*  BUN 22* 25*  CREATININE 1.81* 1.70*  CALCIUM 9.5  --    GFR: CrCl cannot be calculated (Unknown ideal weight.). Liver Function Tests:  Recent Labs Lab 10/01/16 1602  AST 25  ALT 14  ALKPHOS 94  BILITOT 0.7  PROT 6.7  ALBUMIN 3.3*   No results for input(s): LIPASE, AMYLASE in the last 168 hours. No results for input(s): AMMONIA in the last 168 hours. Coagulation Profile:  Recent Labs Lab 10/01/16 1602  INR 1.00   Cardiac Enzymes: No results for input(s): CKTOTAL, CKMB, CKMBINDEX, TROPONINI in the last 168 hours. BNP (last 3 results) No results for input(s): PROBNP in the last 8760 hours. HbA1C: No results for input(s): HGBA1C in the last 72 hours. CBG:  Recent Labs Lab 10/01/16 1628  GLUCAP 215*   Lipid  Profile: No results for input(s): CHOL, HDL, LDLCALC, TRIG, CHOLHDL, LDLDIRECT in the last 72 hours. Thyroid Function Tests: No results for input(s): TSH, T4TOTAL, FREET4, T3FREE, THYROIDAB in the last 72 hours. Anemia Panel: No results for input(s): VITAMINB12, FOLATE, FERRITIN, TIBC, IRON, RETICCTPCT in the last 72 hours. Urine analysis:    Component Value Date/Time   COLORURINE YELLOW 09/01/2016 1118   APPEARANCEUR CLEAR 09/01/2016 1118   LABSPEC 1.015 09/01/2016 1118   PHURINE 5.0 09/01/2016 1118   GLUCOSEU 50 (A) 09/01/2016 1118   HGBUR SMALL (A) 09/01/2016 1118   BILIRUBINUR NEGATIVE 09/01/2016 1118   [place] 09/01/2016 1118   PROTEINUR NEGATIVE 09/01/2016 1118   UROBILINOGEN 0.2 05/11/2010 0004   NITRITE NEGATIVE 09/01/2016 1118   LEUKOCYTESUR TRACE (A) 09/01/2016 1118    Radiological Exams on Admission: Ct Head Code Stroke W/o Cm  Result Date: 10/01/2016 CLINICAL DATA:  Code stroke. 83 year old female with right side weakness and abnormal gait. Last seen normal 1230 hours. EXAM: CT HEAD WITHOUT CONTRAST TECHNIQUE: Contiguous axial images were obtained from the base of the skull through the vertex without intravenous contrast. COMPARISON:  Head CT 05/11/2010. Brain MRI and intracranial MRA 08/16/2016 FINDINGS: Brain: Scattered chronic cortical encephalomalacia in the bilateral posterior MCA, anterior left MCA, and right PCA territories. Small chronic infarcts in the left cerebellar hemisphere. No cortically based acute infarct identified. No acute intracranial hemorrhage identified. No midline shift, mass effect, or evidence of intracranial mass lesion. Stable ventricle size and configuration. Vascular: Extensive Calcified atherosclerosis at the skull base. No suspicious intracranial vascular hyperdensity. Skull: No acute osseous abnormality identified. Hyperostosis of the calvarium, normal variant. Sinuses/Orbits: Visualized paranasal sinuses and mastoids are stable and  well pneumatized. Other: No acute orbit or scalp soft tissue findings. ASPECTS [facility] Stroke Program Early CT Score) - Ganglionic level infarction (caudate, lentiform nuclei, internal capsule, insula, M1-M3 cortex): 7 - Supraganglionic infarction (M4-M6 cortex): 3 Total score (0-10 with 10 being normal): 10 IMPRESSION: 1. Bilateral chronic ischemic disease appears stable since 08/16/2016. No acute cortically based infarct or acute intracranial hemorrhage identified. 2. ASPECTS is 10. 3. The above was relayed via text pager to E. Lindzen on 10/01/2016 at 16:19 . Electronically Signed   By: Genevie Ann M.D.   On: 10/01/2016 16:19    EKG: Independently reviewed.   Assessment/Plan Active Problems:   Cerebral thrombosis with cerebral infarction   Right sided weakness  Right sided Weakness:  Admit to telemetry and stroke work up in progress.  MRI BRAIN MRA head and neck.  hgba1c and lipid panel.  Echocardiogram and carotid duplex.  Therapy evals.  Neurology consulted and recommendations given.  Aspirin 325 mg daily.    Hyperlipidemia: resume statin. Lipid panel in am.    COPD: No wheezing heard.   Diabetes mellitus:  Started her on SSI.  A1c ordered.    Hypertension: Permissive hypertension allowed.    CAD: NO chest pain or sob.  Elevated troponins , of unclear etiology.  Get serial troponins.   Stage 4 CKD' creatinine at baseline.   Recent h/o acute blood loss anemia sec to pUD  Currently on aspirin only.  Hemoglobin this admission is normal.   Paroxysmal Afib : currently sinus. On BB at home.   H/o stage 4 non small cell lung cancer, sq cell carcinoma:s/p chemo in march, but she didn't want to undergo any treatments after that.  Follows up with Dr [name].    DVT prophylaxis: lovenox.  Code Status: DNR.  Family Communication: discussed with family at bedside.  Disposition Plan: pending further evaluation.  Consults called: neurology Admission status:  obs/tele.   Hosie Poisson MD [facility] Pager [phone]  If 7PM-7AM, please contact night-coverage [email or web address] Password [email or web address]  10/01/2016, 8:02 PM

## 2016-10-01 NOTE — Progress Notes (Signed)
Patient transporting for CXR.

## 2016-10-01 NOTE — Consult Note (Signed)
Requesting Physician: Dr. Kathrynn Humble    Chief Complaint: Code stroke  History obtained from: Patient and Chart   HPI:                                                                                                                                      Erica Jimenez is a 83 y.o. female who presented today at [facility] Emergency Department from home by EMS with right sided weakness and an abnormal gait. Ms. Shoun noticed arm weakness around 12:00, shortly before her third BM of the day. EMS was initially called to the home for diarrhea and noticed her right sided symptoms and abnormal gait on arrival; activated code stroke. There were no preceding symptoms. Denies headache, dizziness, fall.   Past medical history is significant for CVA without sequelae (per patient), non-hodgkin's lymphoma, bladder cancer, impressive cardiovascular history, diabetes, chronic lung disease.  She is currently on hospice care but wants treatment for the current CVA.  Pertinent Medications: Plavix daily Aspirin 325mg  daily  Imaging: CT head 5/30: Chronic ischemic disease; ASPECTS: 10   Date last known well: Date: 10/01/2016 Time last known well: Time: 12:30 per patient when asked initially, 12:00 when asked a second time.  tPA Given: No: Risk outweighs benefit  Modified Rankin Score: Rankin Score=2   Stroke Risk Factors - diabetes mellitus, hypercoagulable state, hyperlipidemia, hypertension and personal history of CVA   Past Medical History:  Diagnosis Date  . A-V fistula ([place])    pt has 2  fistulas in rt arm  . Alopecia   . Anemia   . Anxiety   . Aortic stenosis   . Arthritis    gout  . Bladder cancer [facility])     s/p resection and BCG treatment.   Marland Kitchen CAD (coronary artery disease)     Pt is s/p anterior MI in 1996 with stent placed in LAD.  Last myoview in our office was in 7/05 and  showed apical anterior, apical, and apical inferior infarct.  Minimal peri-infarct ischemia.  EF 49%.   .  Cardiomyopathy, ischemic   . CKD (chronic kidney disease)     Pt is stage III-IV.  She had a fistula placed but is not on dialysis.  She follows with Dr. Lorrene Reid for  nephrology.   Marland Kitchen COPD (chronic obstructive pulmonary disease) ([place])   . CVA (cerebral vascular accident) ([place])   . DM (diabetes mellitus) ([place])   . Full dentures   . Gout   . History of glaucoma   . HOH (hard of hearing)    slightly  . HTN (hypertension)   . Hyperlipidemia   . Hypothyroidism   . Myocardial infarction ([place])   . PAD (peripheral artery disease) ([facility])     arterial doppler study 12/04 suggestive of > 50% bilateral SFA stenosis.  Pt has mild claudication.  No invasive evaluation given CKD and desire to avoid contrast use.   Marland Kitchen  Shortness of breath dyspnea    on exertion  . Wears glasses     Past Surgical History:  Procedure Laterality Date  . ABDOMINAL AORTAGRAM N/A 08/17/2013   Procedure: ABDOMINAL Maxcine Ham;  Surgeon: Wellington Hampshire, MD;  Location: [facility];  Service: Cardiovascular;  Laterality: N/A;  . ANGIOPLASTY    . CYSTOSCOPY/RETROGRADE/URETEROSCOPY Bilateral 01/09/2015   Procedure: CYSTOSCOPY BILATERAL RETROGRADE PYELOGRAM, WASHINGS RENAL PELVIS & BLADDER ;  Surgeon: Lowella Bandy, MD;  Location: [facility];  Service: Urology;  Laterality: Bilateral;  . CYSTOSTOMY W/ BLADDER BIOPSY    . ESOPHAGOGASTRODUODENOSCOPY N/A 09/02/2016   Procedure: ESOPHAGOGASTRODUODENOSCOPY (EGD);  Surgeon: Jerene Bears, MD;  Location: [facility];  Service: Endoscopy;  Laterality: N/A;  . EYE SURGERY Bilateral    cataract w IOL    Family History  Problem Relation Age of Onset  . Breast cancer Mother   . Tuberculosis Father   . Heart disease Maternal Aunt      reports that she quit smoking about 3 months ago. Her smoking use included Cigarettes. She has a 21.00 pack-year smoking history. She has never used smokeless tobacco. She reports that she drinks about 0.6 oz of alcohol per week . She reports that  she does not use drugs.  Allergies  Allergen Reactions  . Contrast Media [Iodinated Diagnostic Agents] Other (See Comments)    Renal insufficiency  . Fluoxetine Rash  . Sulfonamide Derivatives Nausea Only    Home Medications:                                                                                                                       aspirin 325 MG tablet Take 1 tablet (325 mg total) by mouth daily. Hosie Poisson, MD Reordered  Orderedas:aspirin tablet 325 mg - 325 mg, Oral, Daily, First dose on Wed 10/01/16 at 2000  atorvastatin (LIPITOR) 80 MG tablet Take 80 mg by mouth daily after supper.  Hosie Poisson, MD Reordered  Orderedas:atorvastatin (LIPITOR) tablet 80 mg - 80 mg, Oral, Daily after supper, First dose on Wed 10/01/16 at 2000  bisoprolol (ZEBETA) 5 MG tablet take 1/2 tablet by mouth once daily **PLEASE KEEP UPCOMING APPOINTMENT FOR FURTHER REFILLSHosie Poisson, MD Not Ordered  brinzolamide (AZOPT) 1 % ophthalmic suspension Place 1 drop into both eyes 2 (two) times daily.  Hosie Poisson, MD Reordered  Orderedas:brinzolamide (AZOPT) 1 % ophthalmic suspension 1 drop - 1 drop, Both Eyes, 2 times daily, First dose on Wed 10/01/16 at 2200  Carboxymethylcellulose Sodium (REFRESH TEARS OP) Place 1 drop into both eyes 3 (three) times daily as needed (dry eyes). Both eyes. Hosie Poisson, MD Not Ordered  citalopram (CELEXA) 10 MG tablet Take 2 tablets (20 mg total) by mouth daily after supper. Hosie Poisson, MD Reordered  Orderedas:citalopram (CELEXA) tablet 20 mg - 20 mg, Oral, Daily after supper, First dose on Wed 10/01/16 at 2000  febuxostat (ULORIC) 40 MG tablet Take 40 mg by mouth daily. Hosie Poisson, MD Not  Ordered  feeding supplement (BOOST / RESOURCE BREEZE) LIQD Take 1 Container by mouth 3 (three) times daily between meals. Hosie Poisson, MD Reordered  Orderedas:feeding supplement (BOOST / RESOURCE BREEZE) liquid 1 Container - 1 Container, Oral, 3 times daily between  meals, First dose on Wed 10/01/16 at 2000  feeding supplement, ENSURE ENLIVE, (ENSURE ENLIVE) LIQD Take 237 mLs by mouth 2 (two) times daily between meals. Hosie Poisson, MD Reordered  Orderedas:feeding supplement (ENSURE ENLIVE) (ENSURE ENLIVE) liquid 237 mL - 237 mL, Oral, 2 times daily between meals, First dose on Thu 10/02/16 at 1000  Ferrous Gluconate (FERGON PO) Take 1 tablet by mouth daily after supper.  Hosie Poisson, MD Not Ordered  folic acid (FOLVITE) 1 MG tablet Take 1 mg by mouth daily. Hosie Poisson, MD Reordered  Orderedas:folic acid (FOLVITE) tablet 1 mg - 1 mg, Oral, Daily, First dose on Wed 10/01/16 at 2000  furosemide (LASIX) 40 MG tablet Take 40 mg by mouth daily.  Hosie Poisson, MD Not Ordered  hydrALAZINE (APRESOLINE) 50 MG tablet Take 1 tablet (50 mg total) by mouth 2 (two) times daily. Hosie Poisson, MD Not Ordered  insulin glargine (LANTUS) 100 unit/mL SOPN Inject 12 Units into the skin daily before breakfast. Hosie Poisson, MD Not Ordered  Orderedas:insulin glargine (LANTUS) Solostar Pen 12 Units - 12 Units, Subcutaneous, Daily before breakfast, First dose on Thu 10/02/16 at 0800 (Discontinued)  insulin lispro (HUMALOG KWIKPEN) 100 UNIT/ML KiwkPen Inject 2-3 Units into the skin 3 (three) times daily before meals. Hosie Poisson, MD Not Ordered  isosorbide mononitrate (IMDUR) 60 MG 24 hr tablet Take 60 mg by mouth daily after supper.  Hosie Poisson, MD Not Ordered  latanoprost (XALATAN) 0.005 % ophthalmic solution Place 1 drop into both eyes at bedtime.  Hosie Poisson, MD Reordered  Orderedas:latanoprost (XALATAN) 0.005 % ophthalmic solution 1 drop - 1 drop, Both Eyes, Daily at bedtime, First dose on Wed 10/01/16 at 2200  levothyroxine (SYNTHROID, LEVOTHROID) 50 MCG tablet Take 50 mcg by mouth daily after supper.  Hosie Poisson, MD Reordered  Orderedas:levothyroxine (SYNTHROID, LEVOTHROID) tablet 50 mcg - 50 mcg, Oral, Daily before breakfast, First dose on Thu 10/02/16 at  0800  lidocaine-prilocaine (EMLA) cream Apply 1 application topically as needed. Hosie Poisson, MD Not Ordered   Patient taking differently: Apply 1 application topically daily as needed (vaginal itching).     nitroGLYCERIN (NITROSTAT) 0.4 MG SL tablet Place 1 tablet (0.4 mg total) under the tongue every 5 (five) minutes as needed for chest pain. Hosie Poisson, MD Reordered  Orderedas:nitroGLYCERIN (NITROSTAT) SL tablet 0.4 mg - 0.4 mg, Sublingual, Every 5 min PRN, chest pain, Starting Wed 10/01/16 at 1856  pantoprazole (PROTONIX) 40 MG tablet Take 1 tablet (40 mg total) by mouth 2 (two) times daily. Hosie Poisson, MD Reordered  Plavix PRN Lorazepam   Review Of Systems:                                                                                                           History obtained from the patient  General: Negative for fever Psychological: Negative for any known behavioral disorder Ophthalmic: Negative for blurry or double vision, loss of vision in any field ENT: Negative for abrupt loss of hearing, tinnitus or vertigo Endocrine: Positive for hair pattern changes (bald) Respiratory: Positive for cough, shortness of breath Cardiovascular: Negative for chest pain; Positive for dyspnea on exertion and edema Gastrointestinal: Positive for constipation and diarrhea Genito-Urinary: Negative for dysuria, hematuria Musculoskeletal: Negative for joint swelling; as noted in HPI Neurological: As noted in HPI Dermatological: Negative for rash or skin changes, numbness or tingling  There were no vitals taken for this visit.   Physical Examination:                                                                                                      General: WDWN female.  HEENT:  Normocephalic, no lesions, without obvious abnormality.  Normal external eye and conjunctiva.  Normal external ears. Normal external nose; dry mucus membranes. Normal septum, pharynx. Cardiovascular: S1, S2  normal, pulses palpable throughout   Pulmonary: chest clear, no wheezing, rales, normal symmetric air entry Abdomen: Soft, non-tender Extremities: no joint deformities, effusion, or inflammation and positive BLE edema Musculoskeletal: no joint tenderness, deformity or swelling. Tone and bulk normal, without atrophy or fasciculations  Skin: warm and dry, no hyperpigmentation, vitiligo, or suspicious lesions  Neurological Examination:                                                                                               Mental Status: Erica Jimenez is aert, oriented to self and year, but unaware of age, month or day. Thought content appropriate.  Speech fluent without evidence of aphasia. Able to follow 3-step commands without difficulty. Cranial Nerves: II: Visual fields grossly normal, pupils are equal, round, reactive to light and accommodation. III,IV, VI: Ptosis not present, extra-ocular muscle movements intact bilaterally V,VII: Smile and eyebrow raise is symmetric. Facial light touch and pinprick sensation intact bilaterally VIII: Hearing grossly intact to loud conversation IX,X: Uvula and palate rise symmetrically XI: SCM and bilateral shoulder shrug strength 5/5 XII: Midline tongue extension Motor: Right :     Upper extremity   3-/5   Left:     Upper extremity   5/5          Lower extremity   5/5    Lower extremity   5/5 Sensory: Light touch intact throughout, bilaterally Deep Tendon Reflexes: 2+ and symmetric throughout Plantars: Right: downgoing   Left: downgoing Cerebellar: Finger-to-nose test with significant ataxia on the right, within normal limits on the left. Heel-to-shin test mildly ataxic on the right, executed within normal limits on the left. Gait: Not tested  Lab Results:  Basic Metabolic Panel:  Recent Labs Lab 10/01/16 1610  NA 139  K 4.9  CL 105  GLUCOSE 231*  BUN 25*  CREATININE 1.70*    Liver Function Tests: No results for input(s): AST, ALT,  ALKPHOS, BILITOT, PROT, ALBUMIN in the last 168 hours. No results for input(s): LIPASE, AMYLASE in the last 168 hours. No results for input(s): AMMONIA in the last 168 hours.  CBC:  Recent Labs Lab 10/01/16 1602 10/01/16 1610  WBC 7.7  --   NEUTROABS 4.9  --   HGB 12.1 13.6  HCT 38.6 40.0  MCV 96.0  --   PLT 179  --     Cardiac Enzymes: No results for input(s): CKTOTAL, CKMB, CKMBINDEX, TROPONINI in the last 168 hours.  Lipid Panel: No results for input(s): CHOL, TRIG, HDL, CHOLHDL, VLDL, LDLCALC in the last 168 hours.  CBG: No results for input(s): GLUCAP in the last 168 hours.  Microbiology: Results for orders placed or performed during the hospital encounter of 09/01/16  Blood Culture (routine x 2)     Status: Abnormal   Collection Time: 09/01/16  1:08 PM  Result Value Ref Range Status   Specimen Description BLOOD LEFT WRIST  Final   Special Requests   Final    BOTTLES DRAWN AEROBIC ONLY Blood Culture adequate volume   Culture  Setup Time   Final    GRAM POSITIVE COCCI IN PAIRS AEROBIC BOTTLE ONLY CRITICAL RESULT CALLED TO, READ BACK BY AND VERIFIED WITH: N GLOGOVAC PHARMD 2153 09/02/16 A BROWNING    Culture (A)  Final    MICROCOCCUS LUTEUS/LYLAE THE SIGNIFICANCE OF ISOLATING THIS ORGANISM FROM A SINGLE SET OF BLOOD CULTURES WHEN MULTIPLE SETS ARE DRAWN IS UNCERTAIN. PLEASE NOTIFY THE MICROBIOLOGY DEPARTMENT WITHIN ONE WEEK IF SPECIATION AND SENSITIVITIES ARE REQUIRED. Performed at [facility], [place] [address] [place], [place] [address]    Report Status 09/03/2016 FINAL  Final  Blood Culture ID Panel (Reflexed)     Status: None   Collection Time: 09/01/16  1:08 PM  Result Value Ref Range Status   Enterococcus species NOT DETECTED NOT DETECTED Final   Listeria monocytogenes NOT DETECTED NOT DETECTED Final   Staphylococcus species NOT DETECTED NOT DETECTED Final   Staphylococcus aureus NOT DETECTED NOT DETECTED Final   Streptococcus species NOT DETECTED NOT  DETECTED Final   Streptococcus agalactiae NOT DETECTED NOT DETECTED Final   Streptococcus pneumoniae NOT DETECTED NOT DETECTED Final   Streptococcus pyogenes NOT DETECTED NOT DETECTED Final   Acinetobacter baumannii NOT DETECTED NOT DETECTED Final   Enterobacteriaceae species NOT DETECTED NOT DETECTED Final   Enterobacter cloacae complex NOT DETECTED NOT DETECTED Final   Escherichia coli NOT DETECTED NOT DETECTED Final   Klebsiella oxytoca NOT DETECTED NOT DETECTED Final   Klebsiella pneumoniae NOT DETECTED NOT DETECTED Final   Proteus species NOT DETECTED NOT DETECTED Final   Serratia marcescens NOT DETECTED NOT DETECTED Final   Haemophilus influenzae NOT DETECTED NOT DETECTED Final   Neisseria meningitidis NOT DETECTED NOT DETECTED Final   Pseudomonas aeruginosa NOT DETECTED NOT DETECTED Final   Candida albicans NOT DETECTED NOT DETECTED Final   Candida glabrata NOT DETECTED NOT DETECTED Final   Candida krusei NOT DETECTED NOT DETECTED Final   Candida parapsilosis NOT DETECTED NOT DETECTED Final   Candida tropicalis NOT DETECTED NOT DETECTED Final    Comment: Performed at [facility], [place] [address], [place], [place] [address]  Blood Culture (routine x 2)  Status: None   Collection Time: 09/01/16  2:28 PM  Result Value Ref Range Status   Specimen Description BLOOD RIGHT HAND  Final   Special Requests   Final    BOTTLES DRAWN AEROBIC AND ANAEROBIC Blood Culture adequate volume   Culture   Final    NO GROWTH 5 DAYS Performed at [facility], [address], [place], [place] [address]    Report Status 09/06/2016 FINAL  Final  MRSA PCR Screening     Status: None   Collection Time: 09/02/16  9:51 AM  Result Value Ref Range Status   MRSA by PCR NEGATIVE NEGATIVE Final    Comment:        The GeneXpert MRSA Assay (FDA approved for NASAL specimens only), is one component of a comprehensive MRSA colonization surveillance program. It is not intended to diagnose  MRSA infection nor to guide or monitor treatment for MRSA infections.   Culture, blood (Routine X 2) w Reflex to ID Panel     Status: None   Collection Time: 09/03/16  2:35 PM  Result Value Ref Range Status   Specimen Description BLOOD LEFT ARM  Final   Special Requests IN PEDIATRIC BOTTLE Blood Culture adequate volume  Final   Culture   Final    NO GROWTH 5 DAYS Performed at [facility], [address], [place], [place] [address]    Report Status 09/08/2016 FINAL  Final  Culture, blood (Routine X 2) w Reflex to ID Panel     Status: None   Collection Time: 09/03/16  2:38 PM  Result Value Ref Range Status   Specimen Description BLOOD LEFT ARM  Final   Special Requests IN PEDIATRIC BOTTLE Blood Culture adequate volume  Final   Culture   Final    NO GROWTH 5 DAYS Performed at [facility], [place] [address] [place],  [address]    Report Status 09/08/2016 FINAL  Final    Coagulation Studies: No results for input(s): LABPROT, INR in the last 72 hours.  Imaging: Ct Head Code Stroke W/o Cm  Result Date: 10/01/2016 CLINICAL DATA:  Code stroke. 83 year old female with right side weakness and abnormal gait. Last seen normal 1230 hours. EXAM: CT HEAD WITHOUT CONTRAST TECHNIQUE: Contiguous axial images were obtained from the base of the skull through the vertex without intravenous contrast. COMPARISON:  Head CT 05/11/2010. Brain MRI and intracranial MRA 08/16/2016 FINDINGS: Brain: Scattered chronic cortical encephalomalacia in the bilateral posterior MCA, anterior left MCA, and right PCA territories. Small chronic infarcts in the left cerebellar hemisphere. No cortically based acute infarct identified. No acute intracranial hemorrhage identified. No midline shift, mass effect, or evidence of intracranial mass lesion. Stable ventricle size and configuration. Vascular: Extensive Calcified atherosclerosis at the skull base. No suspicious intracranial vascular hyperdensity. Skull:  No acute osseous abnormality identified. Hyperostosis of the calvarium, normal variant. Sinuses/Orbits: Visualized paranasal sinuses and mastoids are stable and well pneumatized. Other: No acute orbit or scalp soft tissue findings. ASPECTS [facility] Stroke Program Early CT Score) - Ganglionic level infarction (caudate, lentiform nuclei, internal capsule, insula, M1-M3 cortex): 7 - Supraganglionic infarction (M4-M6 cortex): 3 Total score (0-10 with 10 being normal): 10 IMPRESSION: 1. Bilateral chronic ischemic disease appears stable since 08/16/2016. No acute cortically based infarct or acute intracranial hemorrhage identified. 2. ASPECTS is 10. 3. The above was relayed via text pager to E. Elbia Paro on 10/01/2016 at 16:19 . Electronically Signed   By: Herminio Heads.D.  On: 10/01/2016 16:19    History and examination documented by Lupita Raider PA-C, [facility] 10/01/2016, 4:26 PM  Assessment: 83 year old female presenting with acute onset of right sided weakness and hemiataxia 1. Findings on exam best localize as a right cerebellar ischemic infarction.  2. NIHSS = 3 3. Based upon her relatively low NIHSS and being 4 hours 15 minutes out from time of symptom onset, the potential benefit of tPA administration is estimated to be relatively low, with an unacceptably high risk of hemorrhage in comparison with the likelihood of significant recovery of cerebellar function. The patient expressed understanding of the risk/benefit profile which was discussed with her.  4. The patient was given the option of proceeding with tPA. Based upon the risk/benefit profile, she elected not to proceed with tPA.  5. Her relatively low NIHSS also does not militate in favor of endovascular treatment, given the potential risks, including subarachnoid hemorrhage and hemorrhagic conversion of infarcted tissue due to luxury perfusion.   Plan:  1. HgbA1c, fasting lipid panel 2. MRI, MRA  of the brain without contrast 3. PT  consult, OT consult, Speech consult 4. Echocardiogram 5. Carotid dopplers 6. Continue Plavix and ASA 7. Continue atorvastatin 8. Telemetry monitoring 9. Frequent neuro checks 10 NPO until passes stroke swallow screen 11 please page stroke NP  Or  PA  Or MD from 8am -4 pm  as this patient from this time will be  followed by the stroke.   You can look them up on [email or web address]  Password [email or web address]    Electronically signed: Dr. Kerney Elbe

## 2016-10-01 NOTE — ED Notes (Addendum)
IV team and Hospice RN- Edyth Gunnels [phone])  @ bedside.

## 2016-10-01 NOTE — Progress Notes (Signed)
RN notified of patients 0.25 troponin level. MD paged. RN will continue to monitor patient.

## 2016-10-02 ENCOUNTER — Observation Stay (HOSPITAL_BASED_OUTPATIENT_CLINIC_OR_DEPARTMENT_OTHER)

## 2016-10-02 ENCOUNTER — Ambulatory Visit: Payer: Medicare Other | Admitting: Internal Medicine

## 2016-10-02 ENCOUNTER — Observation Stay (HOSPITAL_COMMUNITY)

## 2016-10-02 ENCOUNTER — Other Ambulatory Visit: Payer: Medicare Other

## 2016-10-02 ENCOUNTER — Ambulatory Visit: Payer: Medicare Other

## 2016-10-02 DIAGNOSIS — I4891 Unspecified atrial fibrillation: Secondary | ICD-10-CM | POA: Diagnosis not present

## 2016-10-02 DIAGNOSIS — J449 Chronic obstructive pulmonary disease, unspecified: Secondary | ICD-10-CM | POA: Diagnosis not present

## 2016-10-02 DIAGNOSIS — N184 Chronic kidney disease, stage 4 (severe): Secondary | ICD-10-CM | POA: Diagnosis not present

## 2016-10-02 DIAGNOSIS — R262 Difficulty in walking, not elsewhere classified: Secondary | ICD-10-CM | POA: Diagnosis not present

## 2016-10-02 DIAGNOSIS — I255 Ischemic cardiomyopathy: Secondary | ICD-10-CM | POA: Diagnosis present

## 2016-10-02 DIAGNOSIS — E1136 Type 2 diabetes mellitus with diabetic cataract: Secondary | ICD-10-CM | POA: Diagnosis present

## 2016-10-02 DIAGNOSIS — I340 Nonrheumatic mitral (valve) insufficiency: Secondary | ICD-10-CM

## 2016-10-02 DIAGNOSIS — I130 Hypertensive heart and chronic kidney disease with heart failure and stage 1 through stage 4 chronic kidney disease, or unspecified chronic kidney disease: Secondary | ICD-10-CM | POA: Diagnosis not present

## 2016-10-02 DIAGNOSIS — I10 Essential (primary) hypertension: Secondary | ICD-10-CM | POA: Diagnosis not present

## 2016-10-02 DIAGNOSIS — I350 Nonrheumatic aortic (valve) stenosis: Secondary | ICD-10-CM | POA: Diagnosis present

## 2016-10-02 DIAGNOSIS — C3491 Malignant neoplasm of unspecified part of right bronchus or lung: Secondary | ICD-10-CM | POA: Diagnosis not present

## 2016-10-02 DIAGNOSIS — E46 Unspecified protein-calorie malnutrition: Secondary | ICD-10-CM | POA: Diagnosis not present

## 2016-10-02 DIAGNOSIS — K922 Gastrointestinal hemorrhage, unspecified: Secondary | ICD-10-CM | POA: Diagnosis not present

## 2016-10-02 DIAGNOSIS — I639 Cerebral infarction, unspecified: Secondary | ICD-10-CM

## 2016-10-02 DIAGNOSIS — I82402 Acute embolism and thrombosis of unspecified deep veins of left lower extremity: Secondary | ICD-10-CM | POA: Diagnosis not present

## 2016-10-02 DIAGNOSIS — I2510 Atherosclerotic heart disease of native coronary artery without angina pectoris: Secondary | ICD-10-CM | POA: Diagnosis not present

## 2016-10-02 DIAGNOSIS — I480 Paroxysmal atrial fibrillation: Secondary | ICD-10-CM | POA: Diagnosis not present

## 2016-10-02 DIAGNOSIS — M109 Gout, unspecified: Secondary | ICD-10-CM | POA: Diagnosis not present

## 2016-10-02 DIAGNOSIS — R488 Other symbolic dysfunctions: Secondary | ICD-10-CM | POA: Diagnosis not present

## 2016-10-02 DIAGNOSIS — I6523 Occlusion and stenosis of bilateral carotid arteries: Secondary | ICD-10-CM | POA: Diagnosis present

## 2016-10-02 DIAGNOSIS — F419 Anxiety disorder, unspecified: Secondary | ICD-10-CM | POA: Diagnosis present

## 2016-10-02 DIAGNOSIS — E785 Hyperlipidemia, unspecified: Secondary | ICD-10-CM | POA: Diagnosis not present

## 2016-10-02 DIAGNOSIS — I6300 Cerebral infarction due to thrombosis of unspecified precerebral artery: Secondary | ICD-10-CM

## 2016-10-02 DIAGNOSIS — I638 Other cerebral infarction: Secondary | ICD-10-CM | POA: Diagnosis present

## 2016-10-02 DIAGNOSIS — E1151 Type 2 diabetes mellitus with diabetic peripheral angiopathy without gangrene: Secondary | ICD-10-CM | POA: Diagnosis not present

## 2016-10-02 DIAGNOSIS — R1311 Dysphagia, oral phase: Secondary | ICD-10-CM | POA: Diagnosis not present

## 2016-10-02 DIAGNOSIS — Z9350 Unspecified cystostomy status: Secondary | ICD-10-CM | POA: Diagnosis not present

## 2016-10-02 DIAGNOSIS — R29702 NIHSS score 2: Secondary | ICD-10-CM | POA: Diagnosis present

## 2016-10-02 DIAGNOSIS — E1122 Type 2 diabetes mellitus with diabetic chronic kidney disease: Secondary | ICD-10-CM | POA: Diagnosis present

## 2016-10-02 DIAGNOSIS — E1159 Type 2 diabetes mellitus with other circulatory complications: Secondary | ICD-10-CM | POA: Diagnosis not present

## 2016-10-02 DIAGNOSIS — R29703 NIHSS score 3: Secondary | ICD-10-CM | POA: Diagnosis present

## 2016-10-02 DIAGNOSIS — E039 Hypothyroidism, unspecified: Secondary | ICD-10-CM | POA: Diagnosis not present

## 2016-10-02 DIAGNOSIS — I69398 Other sequelae of cerebral infarction: Secondary | ICD-10-CM | POA: Diagnosis not present

## 2016-10-02 DIAGNOSIS — D6859 Other primary thrombophilia: Secondary | ICD-10-CM | POA: Diagnosis present

## 2016-10-02 DIAGNOSIS — Z66 Do not resuscitate: Secondary | ICD-10-CM | POA: Diagnosis present

## 2016-10-02 DIAGNOSIS — I25119 Atherosclerotic heart disease of native coronary artery with unspecified angina pectoris: Secondary | ICD-10-CM | POA: Diagnosis not present

## 2016-10-02 DIAGNOSIS — F339 Major depressive disorder, recurrent, unspecified: Secondary | ICD-10-CM | POA: Diagnosis not present

## 2016-10-02 DIAGNOSIS — I679 Cerebrovascular disease, unspecified: Secondary | ICD-10-CM | POA: Diagnosis not present

## 2016-10-02 DIAGNOSIS — D638 Anemia in other chronic diseases classified elsewhere: Secondary | ICD-10-CM | POA: Diagnosis not present

## 2016-10-02 DIAGNOSIS — M6281 Muscle weakness (generalized): Secondary | ICD-10-CM | POA: Diagnosis not present

## 2016-10-02 DIAGNOSIS — I82442 Acute embolism and thrombosis of left tibial vein: Secondary | ICD-10-CM | POA: Diagnosis present

## 2016-10-02 DIAGNOSIS — G8191 Hemiplegia, unspecified affecting right dominant side: Secondary | ICD-10-CM | POA: Diagnosis present

## 2016-10-02 DIAGNOSIS — Z794 Long term (current) use of insulin: Secondary | ICD-10-CM | POA: Diagnosis not present

## 2016-10-02 DIAGNOSIS — R531 Weakness: Secondary | ICD-10-CM | POA: Diagnosis not present

## 2016-10-02 DIAGNOSIS — C3490 Malignant neoplasm of unspecified part of unspecified bronchus or lung: Secondary | ICD-10-CM | POA: Diagnosis not present

## 2016-10-02 DIAGNOSIS — I5042 Chronic combined systolic (congestive) and diastolic (congestive) heart failure: Secondary | ICD-10-CM | POA: Diagnosis not present

## 2016-10-02 DIAGNOSIS — H409 Unspecified glaucoma: Secondary | ICD-10-CM | POA: Diagnosis not present

## 2016-10-02 DIAGNOSIS — Z515 Encounter for palliative care: Secondary | ICD-10-CM | POA: Diagnosis present

## 2016-10-02 LAB — ECHOCARDIOGRAM COMPLETE
CHL CUP MV DEC (S): 433
EERAT: 23.52
EWDT: 433 ms
FS: 19 % — AB (ref 28–44)
IVS/LV PW RATIO, ED: 0.86
LA ID, A-P, ES: 34 mm
LA vol A4C: 51.4 ml
LA vol: 72.9 mL
LEFT ATRIUM END SYS DIAM: 34 mm
LV PW d: 8.99 mm — AB (ref 0.6–1.1)
LV TDI E'MEDIAL: 3.38
LVEEAVG: 23.52
LVEEMED: 23.52
LVOT area: 2.84 cm2
LVOT diameter: 19 mm
MV Peak grad: 3 mmHg
MV pk A vel: 111 m/s
MVPKEVEL: 79.5 m/s

## 2016-10-02 LAB — RAPID URINE DRUG SCREEN, HOSP PERFORMED
Amphetamines: NOT DETECTED
Barbiturates: NOT DETECTED
Benzodiazepines: NOT DETECTED
COCAINE: NOT DETECTED
OPIATES: POSITIVE — AB
TETRAHYDROCANNABINOL: POSITIVE — AB

## 2016-10-02 LAB — URINALYSIS, ROUTINE W REFLEX MICROSCOPIC
Bilirubin Urine: NEGATIVE
GLUCOSE, UA: NEGATIVE mg/dL
Hgb urine dipstick: NEGATIVE
Ketones, ur: NEGATIVE mg/dL
NITRITE: NEGATIVE
PH: 5.5 (ref 5.0–8.0)
Protein, ur: 100 mg/dL — AB
SPECIFIC GRAVITY, URINE: 1.025 (ref 1.005–1.030)

## 2016-10-02 LAB — LIPID PANEL
Cholesterol: 182 mg/dL (ref 0–200)
HDL: 53 mg/dL (ref >40)
LDL CALC: 111 mg/dL — AB (ref 0–99)
TRIGLYCERIDES: 90 mg/dL (ref <150)
Total CHOL/HDL Ratio: 3.4 RATIO
VLDL: 18 mg/dL (ref 0–40)

## 2016-10-02 LAB — TROPONIN I
TROPONIN I: 0.26 ng/mL — AB (ref <0.03)
Troponin I: 0.26 ng/mL (ref <0.03)

## 2016-10-02 LAB — VAS US CAROTID
LCCADDIAS: -5 cm/s
LCCAPDIAS: 14 cm/s
LCCAPSYS: 59 cm/s
LEFT ECA DIAS: -4 cm/s
LEFT VERTEBRAL DIAS: -12 cm/s
LICADSYS: -69 cm/s
LICAPDIAS: -9 cm/s
Left CCA dist sys: -57 cm/s
Left ICA dist dias: -14 cm/s
Left ICA prox sys: -52 cm/s
RIGHT ECA DIAS: -10 cm/s
RIGHT VERTEBRAL DIAS: 15 cm/s
Right CCA prox dias: 12 cm/s
Right CCA prox sys: 41 cm/s
Right cca dist sys: -83 cm/s

## 2016-10-02 LAB — GLUCOSE, CAPILLARY
GLUCOSE-CAPILLARY: 226 mg/dL — AB (ref 65–99)
GLUCOSE-CAPILLARY: 165 mg/dL — AB (ref 65–99)
Glucose-Capillary: 179 mg/dL — ABNORMAL HIGH (ref 65–99)
Glucose-Capillary: 229 mg/dL — ABNORMAL HIGH (ref 65–99)

## 2016-10-02 LAB — URINALYSIS, MICROSCOPIC (REFLEX)

## 2016-10-02 MED ORDER — INSULIN GLARGINE 100 UNIT/ML ~~LOC~~ SOLN
6.0000 [IU] | Freq: Once | SUBCUTANEOUS | Status: AC
  Administered 2016-10-02: 6 [IU] via SUBCUTANEOUS
  Filled 2016-10-02: qty 0.06

## 2016-10-02 MED ORDER — INSULIN GLARGINE 100 UNIT/ML ~~LOC~~ SOLN
15.0000 [IU] | Freq: Every day | SUBCUTANEOUS | Status: DC
  Administered 2016-10-03: 15 [IU] via SUBCUTANEOUS
  Filled 2016-10-02 (×5): qty 0.15

## 2016-10-02 MED ORDER — CLOPIDOGREL BISULFATE 75 MG PO TABS
75.0000 mg | ORAL_TABLET | Freq: Every day | ORAL | Status: DC
  Administered 2016-10-02 – 2016-10-05 (×4): 75 mg via ORAL
  Filled 2016-10-02 (×4): qty 1

## 2016-10-02 NOTE — Progress Notes (Signed)
Home Care SW made visit to Patient.   PT was in the room getting her up to walk across the room.  Patient did drag her right foot and said her right hand was still not very strong.  Her speech was clear and she was in good spirits.  SW did talk to her about discharge planning and she said she would think about rehab vs going home right away and she would talk to her Daughter about it.  Cecilio Asper, [place] [facility] [place]

## 2016-10-02 NOTE — Evaluation (Signed)
Occupational Therapy Evaluation Patient Details Name: Erica Jimenez MRN: [id number] DOB: 20-Mar-1930 Today's Date: 10/02/2016    History of Present Illness Pt is an 83 y/o female admitted secondary to R UE weakness and gait abnormality. MRI revealed an acute small L frontal lobe nonhemorrhagic infarct. PMH including but not limited to bladder cancer s/p resection and BCG treatment, R sided lung cancer, CAD, CKD, HTN, DM, COPD, PAD and hx of MI.   Clinical Impression   PTA Pt received assistance from aide 2 times a week for bathing, but other than that was independent in ADL and mobility with RW. Pt currently requiring at least min A for ADL due to decreased ROM (fine and gross motor) in RUE (dominant hand) which is impacting her ability to perform ADL. Please see noted below for more specific details. Pt was VERY pleasant and motivated throughout the session, using LUE to assist RUE when needed. OT feels VERY STRONGLY that patient is an excellent candidate for short-term intensive rehab services in CIR prior to returning home. Pt is very motivated to return to her PLOF and demonstrates the ability to tolerate and participate in an extended therapy session. Next session to establish HEP for RUE.   Follow Up Recommendations  CIR;Supervision/Assistance - 24 hour    Equipment Recommendations  Other (comment) (defer to next venue)    Recommendations for Other Services Rehab consult     Precautions / Restrictions Precautions Precautions: Fall Restrictions Weight Bearing Restrictions: No      Mobility Bed Mobility Overal bed mobility: Needs Assistance Bed Mobility: Supine to Sit;Sit to Supine     Supine to sit: Min guard Sit to supine: Min assist   General bed mobility comments: Min A for BLE back into bed, min A to scoot in bed with Pt participating using BLE to push  Transfers Overall transfer level: Needs assistance Equipment used: None Transfers: Sit to/from Stand Sit to Stand:  Min guard         General transfer comment: increased time, min guard for safety    Balance Overall balance assessment: Needs assistance Sitting-balance support: Feet supported Sitting balance-Leahy Scale: Good Sitting balance - Comments: able to don/doff socks sitting EOB   Standing balance support: During functional activity;No upper extremity supported Standing balance-Leahy Scale: Fair                             ADL either performed or assessed with clinical judgement   ADL Overall ADL's : Needs assistance/impaired Eating/Feeding: Moderate assistance;Sitting Eating/Feeding Details (indicate cue type and reason): Pt using LUE to assist with RUE bringing utensils to mouth Grooming: Moderate assistance;Sitting;Wash/dry face Grooming Details (indicate cue type and reason): Pt able to complete with LUE, but Pt preferring to use RUE to "get it working right again" Upper Body Bathing: Sitting;Minimal assistance   Lower Body Bathing: Minimal assistance   Upper Body Dressing : Minimal assistance   Lower Body Dressing: Minimal assistance Lower Body Dressing Details (indicate cue type and reason): able to cross legs sitting EOB (bring feet to knees) with no LOB Toilet Transfer: Minimal assistance;Ambulation (1 HHA)   Toileting- Clothing Manipulation and Hygiene: Minimal assistance       Functional mobility during ADLs: Minimal assistance (1 HHA) General ADL Comments: Pt is very motivated to use RUE as much as possible, and attempts all ADL with RUE prior to using LUE     Vision Baseline Vision/History: Wears glasses Wears Glasses:  At all times Patient Visual Report: No change from baseline Vision Assessment?: No apparent visual deficits     Perception     Praxis      Pertinent Vitals/Pain Pain Assessment: No/denies pain     Hand Dominance Right   Extremity/Trunk Assessment Upper Extremity Assessment Upper Extremity Assessment: RUE deficits/detail RUE  Deficits / Details: MMT revealed 3+/5 for shoulder flexion, shoulder abduction, elbow flexion and elbow extension. Grip strength is diminished as compared to L. Pt with improving sensation to light touch since PT session earlier in R UE/hand/fingers. Functionally Pt with noted ataxic movement in RUE and unable to bring hand to mouth for feeding/face grooming. Increased time required for actions, and RUE drift noted RUE Sensation:  (Pt reports that she is [facility]) RUE Coordination: decreased fine motor;decreased gross motor   Lower Extremity Assessment Lower Extremity Assessment: Defer to PT evaluation   Cervical / Trunk Assessment Cervical / Trunk Assessment: Kyphotic   Communication Communication Communication: No difficulties   Cognition Arousal/Alertness: Awake/alert Behavior During Therapy: WFL for tasks assessed/performed Overall Cognitive Status: Within Functional Limits for tasks assessed                                     General Comments       Exercises     Shoulder Instructions      Home Living Family/patient expects to be discharged to:: Private residence Living Arrangements: Children Available Help at Discharge: Family;Available PRN/intermittently Type of Home: House Home Access: Ramped entrance     Home Layout: One level     Bathroom Shower/Tub: Occupational psychologist: Standard Bathroom Accessibility: Yes How Accessible: Accessible via walker Home Equipment: [place] - 2 wheels;Cane - single point;Bedside commode;Shower seat;Wheelchair - manual          Prior Functioning/Environment Level of Independence: Needs assistance  Gait / Transfers Assistance Needed: ambulates with RW ADL's / Homemaking Assistance Needed: has an aide that comes 2x/week to assist pt with bathing, can dress and feed herself            OT Problem List: Decreased strength;Decreased range of motion;Impaired balance (sitting and/or standing);Decreased  coordination;Decreased safety awareness;Impaired sensation;Impaired UE functional use      OT Treatment/Interventions: Self-care/ADL training;Therapeutic exercise;Energy conservation;DME and/or AE instruction;Therapeutic activities;Patient/family education;Balance training    OT Goals(Current goals can be found in the care plan section) Acute Rehab OT Goals Patient Stated Goal: return to PLOF OT Goal Formulation: With patient Time For Goal Achievement: 10/16/16 Potential to Achieve Goals: Good  OT Frequency: Min 3X/week   Barriers to D/C:            Co-evaluation              AM-PAC PT "6 Clicks" Daily Activity     Outcome Measure Help from another person eating meals?: A Little Help from another person taking care of personal grooming?: A Little Help from another person toileting, which includes using toliet, bedpan, or urinal?: A Little Help from another person bathing (including washing, rinsing, drying)?: A Little Help from another person to put on and taking off regular upper body clothing?: A Little Help from another person to put on and taking off regular lower body clothing?: A Little 6 Click Score: 18   End of Session Equipment Utilized During Treatment: Gait belt Nurse Communication: Mobility status  Activity Tolerance: Patient tolerated treatment well Patient left: in  bed;with call bell/phone within reach;with bed alarm set;with family/visitor present  OT Visit Diagnosis: Unsteadiness on feet (R26.81);Muscle weakness (generalized) (M62.81);Hemiplegia and hemiparesis Hemiplegia - Right/Left: Right Hemiplegia - dominant/non-dominant: Dominant Hemiplegia - caused by: Cerebral infarction                Time: 1540-1610 OT Time Calculation (min): 30 min Charges:  OT General Charges $OT Visit: 1 Procedure OT Evaluation $OT Eval Moderate Complexity: 1 Procedure OT Treatments $Self Care/Home Management : 8-22 mins G-Codes: OT G-codes **NOT FOR INPATIENT  CLASS** Functional Assessment Tool Used: AM-PAC 6 Clicks Daily Activity Functional Limitation: Self care Self Care Current Status (F7588): At least 40 percent but less than 60 percent impaired, limited or restricted Self Care Goal Status (T2549): At least 1 percent but less than 20 percent impaired, limited or restricted   Hulda Humphrey OTR/L [place] 10/02/2016, 5:21 PM

## 2016-10-02 NOTE — Care Management Note (Signed)
Case Management Note  Patient Details  Name: Erica Jimenez MRN: [id number] Date of Birth: 12-Nov-1929  Subjective/Objective:     Pt admitted with CVA. She is from home with family and has [place] following her.                Action/Plan: Awaiting PT/OT recommendations. CM following for d/c needs, physician orders.   Expected Discharge Date:                  Expected Discharge Plan:  Home w Hospice Care  In-House Referral:     Discharge planning Services  CM Consult  Post Acute Care Choice:    Choice offered to:     DME Arranged:    DME Agency:     HH Arranged:    [place] Agency:     Status of Service:  In process, will continue to follow  If discussed at Long Length of Stay Meetings, dates discussed:    Additional Comments:  Pollie Friar, RN 10/02/2016, 11:30 AM

## 2016-10-02 NOTE — Progress Notes (Signed)
Patient c/o lower abdominal cramping and gas. Patient ambulated to [facility] with no results. Oncoming RN informed and will continue to monitor.

## 2016-10-02 NOTE — Progress Notes (Signed)
VASCULAR LAB PRELIMINARY  PRELIMINARY  PRELIMINARY  PRELIMINARY  Carotid duplex completed.    Preliminary report:  1-39% ICA stenosis.  Vertebral artery flow is antegrade.   Louie Meaders, RVT 10/02/2016, 9:06 AM

## 2016-10-02 NOTE — Progress Notes (Signed)
[facility] PROGRESS NOTE  Erica Jimenez UXL:[id number] DOB: Oct 12, 1929 DOA: 10/01/2016   PCP: [name] (Inactive)     Assessment/Plan: Active Problems:   Cerebral thrombosis with cerebral infarction   Right sided weakness   83 y.o. female with medical history significant of  CAD, PVD, HYPERTENSION, CVA, DM, COPD, right side lunc cancer, follows with [name], presents to day to ED as code stroke as she reports right arm weakness and gait abnormality. She noticed the right arm weakness around 12 pm.. She is currently on hospice but wanted to be treated for all treatable conditions. tPA was not given as risk is greater than benefit and pt did not want to go ahead with the tPA. She also reports 3 to 4 bowel movements earlier today. She denies any other complaints. MRI positive for acute CVA. Neurology consulted  Assessment and plan Acute CVA MRI BRAIN MRA head and neck. Acute small LEFT frontal lobe nonhemorrhagic infarct. Subacute subcentimeter LEFT temporal lobe infarct though, considering ADC values, acute component is possible.No emergent large vessel occlusion or severe stenosis Carotid Doppler 1-39% stenosis hgba1c and lipid panel. LDL 111, on statin Echocardiogram pending Therapy evals.CIR  Neurology consulted and recommendations given.  Aspirin 325 mg daily.  Venous Doppler to rule out DVT Telemetry shows sinus rhythm with PACs  Hyperlipidemia: resume statin. LDL 111   COPD: No wheezing heard.   Diabetes mellitus:  Started her on SSI.  Hemoglobin A1c pending Increase Lantus to 15 units  Hypertension: Permissive hypertension allowed.    CAD: Troponin abnormal but  Unchanged 0.25>0.26>0.26  chest pain or sob.   2-D echo to rule out wall motion abnormalities    Stage 4 CKD' creatinine at baseline.  currently 1.7   Recent h/o acute blood loss anemia sec to peptic ulcer disease   Currently on aspirin only.   PPI twice a  day  Paroxysmal Afib : currently sinus. On BB at home.   H/o stage 4 non small cell lung cancer, sq cell carcinoma:s/p chemo in march, but she didn't want to undergo any treatments after that.  Follows up with Dr [name].      DVT prophylaxsis Lovenox  Code Status:  DO NOT RESUSCITATE   Family Communication: Discussed in detail with the patient, all imaging results, lab results explained to the patient   Disposition  complete stroke workup       Consult  Neurology    Procedures-none  Antibiotics: Anti-infectives    None         HPI/Subjective: She is sitting comfortably in the chair ,no focal deficits   Objective: Vitals:   10/02/16 0408 10/02/16 0556 10/02/16 0558 10/02/16 0700  BP: (!) 188/59  (!) 169/67 (!) 161/72  Pulse:  (!) 38 88 79  Resp: 20  18 18   Temp: 98.7 F (37.1 C)   98.5 F (36.9 C)  TempSrc: Oral   Oral  SpO2: 98%  97% 96%    Intake/Output Summary (Last 24 hours) at 10/02/16 0947 Last data filed at 10/02/16 0348  Gross per 24 hour  Intake           135.83 ml  Output                0 ml  Net           135.83 ml    Exam:  Examination:  General exam: Appears calm and comfortable  Respiratory system: Clear to auscultation. Respiratory  effort normal. Cardiovascular system: S1 & S2 heard, RRR. No JVD, murmurs, rubs, gallops or clicks. No pedal edema. Gastrointestinal system: Abdomen is nondistended, soft and nontender. No organomegaly or masses felt. Normal bowel sounds heard. Central nervous system: Alert and oriented. No focal neurological deficits. Extremities: Symmetric 5 x 5 power. Skin: No rashes, lesions or ulcers Psychiatry: Judgement and insight appear normal. Mood & affect appropriate.     Data Reviewed: I have personally reviewed following labs and imaging studies  Micro Results No results found for this or any previous visit (from the past 240 hour(s)).  Radiology Reports Dg Chest 2 View  Result Date:  10/01/2016 CLINICAL DATA:  Acute onset of right upper extremity weakness. Initial encounter. EXAM: CHEST  2 VIEW COMPARISON:  Chest radiograph from 09/04/2016 FINDINGS: The lungs are well-aerated. Minimal residual right basilar airspace opacity may reflect mild residual pneumonia. Mild scarring is noted at the right lung apex. There is no evidence of pleural effusion or pneumothorax. The heart is mildly enlarged. No acute osseous abnormalities are seen. IMPRESSION: 1. Minimal residual right basilar airspace opacity may reflect mild residual pneumonia, improved from the prior study. 2. Mild scarring at the right lung apex. 3. Mild cardiomegaly. Electronically Signed   By: Garald Balding M.D.   On: 10/01/2016 22:34   [name] Contrast  Result Date: 10/01/2016 CLINICAL DATA:  RIGHT arm weakness and gait abnormality at noon today. History of hypertension, stroke, diabetes, lung cancer. EXAM: MRI HEAD WITHOUT CONTRAST MRA HEAD WITHOUT CONTRAST TECHNIQUE: Multiplanar, multiecho pulse sequences of the brain and surrounding structures were obtained without intravenous contrast. Angiographic images of the head were obtained using MRA technique without contrast. COMPARISON:  CT HEAD Oct 01, 2016 at 1612 hours and MRI head August 16, 2016 and MRA head August 17, 2016 FINDINGS: MRI HEAD FINDINGS BRAIN: Curvilinear reduced diffusion LEFT posterior frontal lobe precentral gyrus extending to centrum semiovale, with low ADC values. Residual subcentimeter reduced diffusion LEFT temporal lobe, persistent low ADC values. No susceptibility artifact to suggest hemorrhage. No midline shift, mass effect or masses. Old RIGHT occipital lobe, biparietal, LEFT frontal lobe encephalomalacia. Ex vacuo dilatation of subjacent ventricles. No hydrocephalus. Prominent LEFT greater than RIGHT thalami perivascular spaces associated with chronic hypertension. Old small bilateral cerebellar infarcts. No abnormal extra-axial fluid collections.  VASCULAR: Normal major intracranial vascular flow voids present at skull base. SKULL AND UPPER CERVICAL SPINE: No abnormal sellar expansion. No suspicious calvarial bone marrow signal. Craniocervical junction maintained. SINUSES/ORBITS: Trace paranasal sinus mucosal thickening. The included ocular globes and orbital contents are non-suspicious. Status post bilateral ocular lens implants. OTHER: Patient is edentulous. MRA HEAD FINDINGS ANTERIOR CIRCULATION: Flow related enhancement within bilateral internal carotid artery's; luminal irregularities of the carotid siphons with mild tandem stenosis due to calcific atherosclerosis as seen on today's head CT. RIGHT A1 segment is dominant, patent anterior communicating with normal flow related enhancement bilateral anterior cerebral artery's. Normal flow related enhancement bilateral middle cerebral artery's. No large vessel occlusion, high-grade stenosis, abnormal luminal irregularity, aneurysm. POSTERIOR CIRCULATION: LEFT vertebral artery is dominant. Basilar artery is patent, with normal flow related enhancement of the main branch vessels. Normal flow related enhancement of the posterior cerebral arteries. No large vessel occlusion, high-grade stenosis, abnormal luminal irregularity, aneurysm. ANATOMIC VARIANTS: None. Source images and MIP images were reviewed. IMPRESSION: MRI HEAD: Acute small LEFT frontal lobe nonhemorrhagic infarct. Subacute subcentimeter LEFT temporal lobe infarct though, considering ADC values, acute component is possible. Old biparietal and LEFT frontal lobe infarcts (  MCA territories), old bioccipital lobe infarcts (PCA territories). Old small cerebellar infarcts. MRA HEAD: No emergent large vessel occlusion or severe stenosis. Atherosclerosis resulting in mild stenosis bilateral carotid siphons ICAs. Electronically Signed   By: Elon Alas M.D.   On: 10/01/2016 22:34   Dg Chest Port 1 View  Result Date: 09/04/2016 CLINICAL DATA:  PICC  line placement EXAM: PORTABLE CHEST 1 VIEW COMPARISON:  09/01/2016 FINDINGS: Stable cardiomegaly with aortic atherosclerosis. New left-sided PICC line tip terminates in the distal SVC. New confluent airspace opacities are seen in the right lower lobe concerning for pneumonia. Apical pleuroparenchymal thickening and scarring seen at the right lung apex. No acute osseous abnormality. IMPRESSION: 1. PICC line tip in the distal SVC. 2. New confluent airspace opacity in the right lung base suspicious for pneumonia. 3. Cardiomegaly with aortic atherosclerosis. Electronically Signed   By: Ashley Royalty M.D.   On: 09/04/2016 21:11   Dg Abd 2 Views  Result Date: 09/03/2016 CLINICAL DATA:  Acute bleeding gastric ulcer. EXAM: ABDOMEN - 2 VIEW COMPARISON:  None. FINDINGS: No free air is identified. Three biopsy clips are seen in the right upper quadrant projecting over the expected location of the distal ascending and proximal transverse colon and one in the right lower quad. Scattered air containing small and large bowel loops without obstruction are noted. No pneumothorax. Bone-on-bone apposition of the left hip with subchondral sclerosis and degenerative subchondral cystic change is identified. Aortoiliac atherosclerosis is noted. Injection granulomas project over the right gluteal region. No radio-opaque calculi or other significant radiographic abnormality is seen. IMPRESSION: 1. No free air identified. Scattered air containing small large bowel loops without obstruction. 2. Severe osteoarthritic joint space narrowing of the left hip with bone-on-bone apposition. 3. Three biopsy clips are seen in the right upper quadrant of the abdomen and one in the right lower quadrant. Electronically Signed   By: Ashley Royalty M.D.   On: 09/03/2016 19:31   Mr Jodene Nam Head/brain UE Cm  Result Date: 10/01/2016 CLINICAL DATA:  RIGHT arm weakness and gait abnormality at noon today. History of hypertension, stroke, diabetes, lung cancer.  EXAM: MRI HEAD WITHOUT CONTRAST MRA HEAD WITHOUT CONTRAST TECHNIQUE: Multiplanar, multiecho pulse sequences of the brain and surrounding structures were obtained without intravenous contrast. Angiographic images of the head were obtained using MRA technique without contrast. COMPARISON:  CT HEAD Oct 01, 2016 at 1612 hours and MRI head August 16, 2016 and MRA head August 17, 2016 FINDINGS: MRI HEAD FINDINGS BRAIN: Curvilinear reduced diffusion LEFT posterior frontal lobe precentral gyrus extending to centrum semiovale, with low ADC values. Residual subcentimeter reduced diffusion LEFT temporal lobe, persistent low ADC values. No susceptibility artifact to suggest hemorrhage. No midline shift, mass effect or masses. Old RIGHT occipital lobe, biparietal, LEFT frontal lobe encephalomalacia. Ex vacuo dilatation of subjacent ventricles. No hydrocephalus. Prominent LEFT greater than RIGHT thalami perivascular spaces associated with chronic hypertension. Old small bilateral cerebellar infarcts. No abnormal extra-axial fluid collections. VASCULAR: Normal major intracranial vascular flow voids present at skull base. SKULL AND UPPER CERVICAL SPINE: No abnormal sellar expansion. No suspicious calvarial bone marrow signal. Craniocervical junction maintained. SINUSES/ORBITS: Trace paranasal sinus mucosal thickening. The included ocular globes and orbital contents are non-suspicious. Status post bilateral ocular lens implants. OTHER: Patient is edentulous. MRA HEAD FINDINGS ANTERIOR CIRCULATION: Flow related enhancement within bilateral internal carotid artery's; luminal irregularities of the carotid siphons with mild tandem stenosis due to calcific atherosclerosis as seen on today's head CT. RIGHT A1 segment is dominant,  patent anterior communicating with normal flow related enhancement bilateral anterior cerebral artery's. Normal flow related enhancement bilateral middle cerebral artery's. No large vessel occlusion, high-grade  stenosis, abnormal luminal irregularity, aneurysm. POSTERIOR CIRCULATION: LEFT vertebral artery is dominant. Basilar artery is patent, with normal flow related enhancement of the main branch vessels. Normal flow related enhancement of the posterior cerebral arteries. No large vessel occlusion, high-grade stenosis, abnormal luminal irregularity, aneurysm. ANATOMIC VARIANTS: None. Source images and MIP images were reviewed. IMPRESSION: MRI HEAD: Acute small LEFT frontal lobe nonhemorrhagic infarct. Subacute subcentimeter LEFT temporal lobe infarct though, considering ADC values, acute component is possible. Old biparietal and LEFT frontal lobe infarcts (MCA territories), old bioccipital lobe infarcts (PCA territories). Old small cerebellar infarcts. MRA HEAD: No emergent large vessel occlusion or severe stenosis. Atherosclerosis resulting in mild stenosis bilateral carotid siphons ICAs. Electronically Signed   By: Elon Alas M.D.   On: 10/01/2016 22:34   Ct Head Code Stroke W/o Cm  Result Date: 10/01/2016 CLINICAL DATA:  Code stroke. 83 year old female with right side weakness and abnormal gait. Last seen normal 1230 hours. EXAM: CT HEAD WITHOUT CONTRAST TECHNIQUE: Contiguous axial images were obtained from the base of the skull through the vertex without intravenous contrast. COMPARISON:  Head CT 05/11/2010. Brain MRI and intracranial MRA 08/16/2016 FINDINGS: Brain: Scattered chronic cortical encephalomalacia in the bilateral posterior MCA, anterior left MCA, and right PCA territories. Small chronic infarcts in the left cerebellar hemisphere. No cortically based acute infarct identified. No acute intracranial hemorrhage identified. No midline shift, mass effect, or evidence of intracranial mass lesion. Stable ventricle size and configuration. Vascular: Extensive Calcified atherosclerosis at the skull base. No suspicious intracranial vascular hyperdensity. Skull: No acute osseous abnormality identified.  Hyperostosis of the calvarium, normal variant. Sinuses/Orbits: Visualized paranasal sinuses and mastoids are stable and well pneumatized. Other: No acute orbit or scalp soft tissue findings. ASPECTS [facility] Stroke Program Early CT Score) - Ganglionic level infarction (caudate, lentiform nuclei, internal capsule, insula, M1-M3 cortex): 7 - Supraganglionic infarction (M4-M6 cortex): 3 Total score (0-10 with 10 being normal): 10 IMPRESSION: 1. Bilateral chronic ischemic disease appears stable since 08/16/2016. No acute cortically based infarct or acute intracranial hemorrhage identified. 2. ASPECTS is 10. 3. The above was relayed via text pager to E. Lindzen on 10/01/2016 at 16:19 . Electronically Signed   By: Genevie Ann M.D.   On: 10/01/2016 16:19     CBC  Recent Labs Lab 10/01/16 1602 10/01/16 1610  WBC 7.7  --   HGB 12.1 13.6  HCT 38.6 40.0  PLT 179  --   MCV 96.0  --   MCH 30.1  --   MCHC 31.3  --   RDW 16.5*  --   LYMPHSABS 1.7  --   MONOABS 0.8  --   EOSABS 0.2  --   BASOSABS 0.0  --     Chemistries   Recent Labs Lab 10/01/16 1602 10/01/16 1610  NA 137 139  K 5.0 4.9  CL 107 105  CO2 20*  --   GLUCOSE 240* 231*  BUN 22* 25*  CREATININE 1.81* 1.70*  CALCIUM 9.5  --   AST 25  --   ALT 14  --   ALKPHOS 94  --   BILITOT 0.7  --    ------------------------------------------------------------------------------------------------------------------ CrCl cannot be calculated (Unknown ideal weight.). ------------------------------------------------------------------------------------------------------------------ No results for input(s): HGBA1C in the last 72 hours. ------------------------------------------------------------------------------------------------------------------  Recent Labs  10/02/16 0249  CHOL 182  HDL 53  LDLCALC 111*  TRIG 90  CHOLHDL 3.4    ------------------------------------------------------------------------------------------------------------------ No results for input(s): TSH, T4TOTAL, T3FREE, THYROIDAB in the last 72 hours.  Invalid input(s): FREET3 ------------------------------------------------------------------------------------------------------------------ No results for input(s): VITAMINB12, FOLATE, FERRITIN, TIBC, IRON, RETICCTPCT in the last 72 hours.  Coagulation profile  Recent Labs Lab 10/01/16 1602  INR 1.00    No results for input(s): DDIMER in the last 72 hours.  Cardiac Enzymes  Recent Labs Lab 10/01/16 2235 10/02/16 0249 10/02/16 0746  TROPONINI 0.25* 0.26* 0.26*   ------------------------------------------------------------------------------------------------------------------ Invalid input(s): POCBNP   CBG:  Recent Labs Lab 10/01/16 1628 10/01/16 2215 10/02/16 0610  GLUCAP 215* 212* 179*       Studies: Dg Chest 2 View  Result Date: 10/01/2016 CLINICAL DATA:  Acute onset of right upper extremity weakness. Initial encounter. EXAM: CHEST  2 VIEW COMPARISON:  Chest radiograph from 09/04/2016 FINDINGS: The lungs are well-aerated. Minimal residual right basilar airspace opacity may reflect mild residual pneumonia. Mild scarring is noted at the right lung apex. There is no evidence of pleural effusion or pneumothorax. The heart is mildly enlarged. No acute osseous abnormalities are seen. IMPRESSION: 1. Minimal residual right basilar airspace opacity may reflect mild residual pneumonia, improved from the prior study. 2. Mild scarring at the right lung apex. 3. Mild cardiomegaly. Electronically Signed   By: Garald Balding M.D.   On: 10/01/2016 22:34   [name] Contrast  Result Date: 10/01/2016 CLINICAL DATA:  RIGHT arm weakness and gait abnormality at noon today. History of hypertension, stroke, diabetes, lung cancer. EXAM: MRI HEAD WITHOUT CONTRAST MRA HEAD WITHOUT CONTRAST  TECHNIQUE: Multiplanar, multiecho pulse sequences of the brain and surrounding structures were obtained without intravenous contrast. Angiographic images of the head were obtained using MRA technique without contrast. COMPARISON:  CT HEAD Oct 01, 2016 at 1612 hours and MRI head August 16, 2016 and MRA head August 17, 2016 FINDINGS: MRI HEAD FINDINGS BRAIN: Curvilinear reduced diffusion LEFT posterior frontal lobe precentral gyrus extending to centrum semiovale, with low ADC values. Residual subcentimeter reduced diffusion LEFT temporal lobe, persistent low ADC values. No susceptibility artifact to suggest hemorrhage. No midline shift, mass effect or masses. Old RIGHT occipital lobe, biparietal, LEFT frontal lobe encephalomalacia. Ex vacuo dilatation of subjacent ventricles. No hydrocephalus. Prominent LEFT greater than RIGHT thalami perivascular spaces associated with chronic hypertension. Old small bilateral cerebellar infarcts. No abnormal extra-axial fluid collections. VASCULAR: Normal major intracranial vascular flow voids present at skull base. SKULL AND UPPER CERVICAL SPINE: No abnormal sellar expansion. No suspicious calvarial bone marrow signal. Craniocervical junction maintained. SINUSES/ORBITS: Trace paranasal sinus mucosal thickening. The included ocular globes and orbital contents are non-suspicious. Status post bilateral ocular lens implants. OTHER: Patient is edentulous. MRA HEAD FINDINGS ANTERIOR CIRCULATION: Flow related enhancement within bilateral internal carotid artery's; luminal irregularities of the carotid siphons with mild tandem stenosis due to calcific atherosclerosis as seen on today's head CT. RIGHT A1 segment is dominant, patent anterior communicating with normal flow related enhancement bilateral anterior cerebral artery's. Normal flow related enhancement bilateral middle cerebral artery's. No large vessel occlusion, high-grade stenosis, abnormal luminal irregularity, aneurysm. POSTERIOR  CIRCULATION: LEFT vertebral artery is dominant. Basilar artery is patent, with normal flow related enhancement of the main branch vessels. Normal flow related enhancement of the posterior cerebral arteries. No large vessel occlusion, high-grade stenosis, abnormal luminal irregularity, aneurysm. ANATOMIC VARIANTS: None. Source images and MIP images were reviewed. IMPRESSION: MRI HEAD: Acute small LEFT frontal lobe nonhemorrhagic infarct. Subacute subcentimeter LEFT temporal lobe infarct though, considering  ADC values, acute component is possible. Old biparietal and LEFT frontal lobe infarcts (MCA territories), old bioccipital lobe infarcts (PCA territories). Old small cerebellar infarcts. MRA HEAD: No emergent large vessel occlusion or severe stenosis. Atherosclerosis resulting in mild stenosis bilateral carotid siphons ICAs. Electronically Signed   By: Elon Alas M.D.   On: 10/01/2016 22:34   Mr Jodene Nam Head/brain JY Cm  Result Date: 10/01/2016 CLINICAL DATA:  RIGHT arm weakness and gait abnormality at noon today. History of hypertension, stroke, diabetes, lung cancer. EXAM: MRI HEAD WITHOUT CONTRAST MRA HEAD WITHOUT CONTRAST TECHNIQUE: Multiplanar, multiecho pulse sequences of the brain and surrounding structures were obtained without intravenous contrast. Angiographic images of the head were obtained using MRA technique without contrast. COMPARISON:  CT HEAD Oct 01, 2016 at 1612 hours and MRI head August 16, 2016 and MRA head August 17, 2016 FINDINGS: MRI HEAD FINDINGS BRAIN: Curvilinear reduced diffusion LEFT posterior frontal lobe precentral gyrus extending to centrum semiovale, with low ADC values. Residual subcentimeter reduced diffusion LEFT temporal lobe, persistent low ADC values. No susceptibility artifact to suggest hemorrhage. No midline shift, mass effect or masses. Old RIGHT occipital lobe, biparietal, LEFT frontal lobe encephalomalacia. Ex vacuo dilatation of subjacent ventricles. No  hydrocephalus. Prominent LEFT greater than RIGHT thalami perivascular spaces associated with chronic hypertension. Old small bilateral cerebellar infarcts. No abnormal extra-axial fluid collections. VASCULAR: Normal major intracranial vascular flow voids present at skull base. SKULL AND UPPER CERVICAL SPINE: No abnormal sellar expansion. No suspicious calvarial bone marrow signal. Craniocervical junction maintained. SINUSES/ORBITS: Trace paranasal sinus mucosal thickening. The included ocular globes and orbital contents are non-suspicious. Status post bilateral ocular lens implants. OTHER: Patient is edentulous. MRA HEAD FINDINGS ANTERIOR CIRCULATION: Flow related enhancement within bilateral internal carotid artery's; luminal irregularities of the carotid siphons with mild tandem stenosis due to calcific atherosclerosis as seen on today's head CT. RIGHT A1 segment is dominant, patent anterior communicating with normal flow related enhancement bilateral anterior cerebral artery's. Normal flow related enhancement bilateral middle cerebral artery's. No large vessel occlusion, high-grade stenosis, abnormal luminal irregularity, aneurysm. POSTERIOR CIRCULATION: LEFT vertebral artery is dominant. Basilar artery is patent, with normal flow related enhancement of the main branch vessels. Normal flow related enhancement of the posterior cerebral arteries. No large vessel occlusion, high-grade stenosis, abnormal luminal irregularity, aneurysm. ANATOMIC VARIANTS: None. Source images and MIP images were reviewed. IMPRESSION: MRI HEAD: Acute small LEFT frontal lobe nonhemorrhagic infarct. Subacute subcentimeter LEFT temporal lobe infarct though, considering ADC values, acute component is possible. Old biparietal and LEFT frontal lobe infarcts (MCA territories), old bioccipital lobe infarcts (PCA territories). Old small cerebellar infarcts. MRA HEAD: No emergent large vessel occlusion or severe stenosis. Atherosclerosis  resulting in mild stenosis bilateral carotid siphons ICAs. Electronically Signed   By: Elon Alas M.D.   On: 10/01/2016 22:34   Ct Head Code Stroke W/o Cm  Result Date: 10/01/2016 CLINICAL DATA:  Code stroke. 83 year old female with right side weakness and abnormal gait. Last seen normal 1230 hours. EXAM: CT HEAD WITHOUT CONTRAST TECHNIQUE: Contiguous axial images were obtained from the base of the skull through the vertex without intravenous contrast. COMPARISON:  Head CT 05/11/2010. Brain MRI and intracranial MRA 08/16/2016 FINDINGS: Brain: Scattered chronic cortical encephalomalacia in the bilateral posterior MCA, anterior left MCA, and right PCA territories. Small chronic infarcts in the left cerebellar hemisphere. No cortically based acute infarct identified. No acute intracranial hemorrhage identified. No midline shift, mass effect, or evidence of intracranial mass lesion. Stable ventricle size and configuration.  Vascular: Extensive Calcified atherosclerosis at the skull base. No suspicious intracranial vascular hyperdensity. Skull: No acute osseous abnormality identified. Hyperostosis of the calvarium, normal variant. Sinuses/Orbits: Visualized paranasal sinuses and mastoids are stable and well pneumatized. Other: No acute orbit or scalp soft tissue findings. ASPECTS [facility] Stroke Program Early CT Score) - Ganglionic level infarction (caudate, lentiform nuclei, internal capsule, insula, M1-M3 cortex): 7 - Supraganglionic infarction (M4-M6 cortex): 3 Total score (0-10 with 10 being normal): 10 IMPRESSION: 1. Bilateral chronic ischemic disease appears stable since 08/16/2016. No acute cortically based infarct or acute intracranial hemorrhage identified. 2. ASPECTS is 10. 3. The above was relayed via text pager to E. Lindzen on 10/01/2016 at 16:19 . Electronically Signed   By: Genevie Ann M.D.   On: 10/01/2016 16:19      Lab Results  Component Value Date   HGBA1C 9.0 (H) 09/01/2016   HGBA1C 7.6  (H) 08/17/2016   Lab Results  Component Value Date   LDLCALC 111 (H) 10/02/2016   CREATININE 1.70 (H) 10/01/2016       Scheduled Meds: . aspirin  325 mg Oral Daily  . atorvastatin  80 mg Oral QPC supper  . brinzolamide  1 drop Both Eyes BID  . calcitRIOL  0.25 mcg Oral Q M,W,F  . citalopram  20 mg Oral QPC supper  . enoxaparin (LOVENOX) injection  30 mg Subcutaneous Q24H  . feeding supplement  1 Container Oral TID BM  . feeding supplement (ENSURE ENLIVE)  237 mL Oral BID BM  . folic acid  1 mg Oral Daily  . insulin aspart  0-5 Units Subcutaneous QHS  . insulin aspart  0-9 Units Subcutaneous TID WC  . insulin glargine  12 Units Subcutaneous QAC breakfast  . latanoprost  1 drop Both Eyes QHS  . levothyroxine  50 mcg Oral QAC breakfast  . pantoprazole  40 mg Oral BID   Continuous Infusions: . sodium chloride 50 mL/hr at 10/02/16 0105     LOS: 0 days    Time spent: >30 MINS    Reyne Dumas  [facility] Pager [phone]. If 7PM-7AM, please contact night-coverage at [email or web address], password [email or web address] 10/02/2016, 9:47 AM  LOS: 0 days

## 2016-10-02 NOTE — Progress Notes (Signed)
SLP Cancellation Note  Patient Details Name: Erica Jimenez MRN: [id number] DOB: 06/23/1929   Cancelled treatment:       Reason Eval/Treat Not Completed: Patient at procedure or test/unavailable. Will continue efforts.  Lyrica Mcclarty B. Quentin Ore [facility], CCC-SLP [phone]  Shonna Chock 10/02/2016, 3:41 PM

## 2016-10-02 NOTE — Progress Notes (Signed)
  Echocardiogram 2D Echocardiogram has been performed.  Erica Jimenez 10/02/2016, 9:52 AM

## 2016-10-02 NOTE — Progress Notes (Addendum)
[facility] - GIP RN visit at 0820am.  This is a related and covered GIP admission of 10/01/16 with [facility] diagnosis of Lung Cancer per Dr. Orpah Melter.  Patient has an [place] DNR.  EMS activated after patient/daughter were aware of stroke like symptoms in the home and confirmed by HA that had visited the patient.   Visited with patient this morning.  Patient was in better spirits.  Patient was alert and oriented.   Patient complains of still having some trouble with her R arm/side.  She was unable to lift up fully during my visit.  No facial droop noticed.  Patient was clear in speech.  Patient states that the diarrhea she had been having is resolved and no episodes during the night.  Per Claiborne Billings, RN, patient expected to be in hospital 2-3 days as she is having full stroke work up.  Patient requested the full work up upon coming to ED. Patient expressed wants to keep DNR intact.  Patient currently receiving:  Aspirin tablet 325 mg, Dose 325 mg, QD via PO; atorvastatin (LIPITOR) tablet 80 mg, Dose 80 mg, QD via PO; brinzolamide (AZOPT) 2% opthalmic suspension 1 drop, Dose 1 drop, BID via eyedrops; calcitRIOL (ROCALTROL) CAPSULE 0.25 mcg, Dose 0.25 mcg, Every M-W-F via PO; citalopram (CELEXA) tablet 20 mg, Dose 20 mg, QD after supper via PO; feeding supplement (BOOST/RESOURCE BREEZE) liquid 1 container, Dose 1 container, TID via PO; feeding supplement (ENSURE ENLIVE) liquid 237 mL, Dose 237 mL, BID via PO; folic acid (FOLVITE) tablet 1 mg, Dose 1 mg, QD via PO; insulin aspart (novoLOG) injection 0-5 Units, Dose 0-5 Units, QHS via [place]; insulin aspart (novoLOG) injection 0-9 Units, Dose 0-9 Units, TID with meals via [place]; insulin glargine (LANTUS) injection 6 units, Dose 6 units, once via [place]; latanoprost (XALATAN) 0.0025% opthalmic solution 1 drop, Dose 1 drop, QHS both eyes via eyedrops; levothyroxine (SYNTHROID, LEVOTHROID) tablet 71mcg, Dose 50 mcg, Daily before  breakfast via PO; pantoprazole (PROTONIX) EC tablet 40 mg, Dose 40 mg, BID via PO.  Contiuous medications:  0.9% sodium chloride infusion, Rate 50 mL/hr, Continuous via IV.  No PRN medications have been given thus far today.  Transfer summary and medication list to be attached to shadow chart.  Dr. Orpah Melter and Dr. Ermalene Searing. Philip Aspen both advised of admission.  Will continue to monitor patient while in the hospital and anticipate any discharge needs.  Thank you,  Edyth Gunnels, RN, BSN [facility] Liaison [phone]  All hospital liaisons are now on [place].

## 2016-10-02 NOTE — Progress Notes (Signed)
MD notiatient troponin level of 0.26. Patient denies any chest pain, SOB or pain. RN will continue to monitor.

## 2016-10-02 NOTE — Progress Notes (Signed)
RN spoke with patients daughter, [name] Ends, update provided. Ms. Rosana Hoes requested that patient contact her once she wakes up. Ms. Rosana Hoes' extension number given to oncoming RN  and number placed on patients communication board. Patient daughter very apologetic to RN for for initial verbal agitation/aggretion toward RN. RN provided reassurance to family.

## 2016-10-02 NOTE — Progress Notes (Signed)
SCDs ordered

## 2016-10-02 NOTE — Evaluation (Signed)
Physical Therapy Evaluation Patient Details Name: Erica Jimenez MRN: [id number] DOB: [birth year] Today's Date: 10/02/2016   History of Present Illness  Pt is an 83 y/o female admitted secondary to R UE weakness and gait abnormality. MRI revealed an acute small L frontal lobe nonhemorrhagic infarct. PMH including but not limited to bladder cancer s/p resection and BCG treatment, R sided lung cancer, CAD, CKD, HTN, DM, COPD, PAD and hx of MI.  Clinical Impression  Pt presented sitting OOB in recliner chair, awake and willing to participate in therapy session. Pt was very pleasant and motivated to work with therapist. Pt lives at home with her daughter, who works during the day. Pt stated that she could find other friends/family to assist her 24/7 if really needed. Pt reported that she has an aide that comes twice a week to assist her with bathing. She can dress and feed herself, and she ambulates with use of RW. Pt ambulated in room with min A for stability without an AD. Pt demonstrated decreased strength and incoordination of R UE as well as abnormalities in gait increasing her risk of falls. Patient is an excellent candidate for short-term intensive rehab services in CIR prior to returning home. Pt is very motivated to return to her PLOF and demonstrates the ability to tolerate and participate in an extended therapy session. Of note, pt's Hospice SW was present at end of session and had a discussion with pt regarding rehab and re-initiating hospice services upon pt's return home. Pt's Hospice SW stated that it would be very easy to re-establish services for the patient once pt was back home. PT will continue to follow pt acutely for mobility progression.     Follow Up Recommendations CIR    Equipment Recommendations  None recommended by PT    Recommendations for Other Services Rehab consult     Precautions / Restrictions Precautions Precautions: Fall Restrictions Weight Bearing Restrictions:  No      Mobility  Bed Mobility               General bed mobility comments: pt sitting OOB in recliner chair when therapist arrived  Transfers Overall transfer level: Needs assistance Equipment used: None Transfers: Sit to/from Stand Sit to Stand: Min guard         General transfer comment: increased time, min guard for safety  Ambulation/Gait Ambulation/Gait assistance: Min assist Ambulation Distance (Feet): 40 Feet Assistive device: None Gait Pattern/deviations: Step-through pattern;Decreased step length - right;Decreased step length - left;Decreased stride length;Decreased dorsiflexion - right;Shuffle;Trunk flexed;Narrow base of support Gait velocity: decreased Gait velocity interpretation: Below normal speed for age/gender General Gait Details: modest instability with ambulation, decreased foot clearance during swing phase on R, vc'ing for improved step length and foot clearance on R. No overt LOB but min A for stability  Stairs            Wheelchair Mobility    Modified Rankin (Stroke Patients Only) Modified Rankin (Stroke Patients Only) Pre-Morbid Rankin Score: Moderate disability Modified Rankin: Moderately severe disability     Balance Overall balance assessment: Needs assistance Sitting-balance support: Feet supported Sitting balance-Leahy Scale: Good     Standing balance support: During functional activity;No upper extremity supported Standing balance-Leahy Scale: Fair                               Pertinent Vitals/Pain Pain Assessment: No/denies pain    Home Living Family/patient expects to  be discharged to:: Private residence Living Arrangements: Children Available Help at Discharge: Family;Available PRN/intermittently Type of Home: House Home Access: Ramped entrance     Home Layout: One level Home Equipment: Walker - 2 wheels;Cane - single point;Bedside commode;Shower seat;Wheelchair - manual      Prior Function  Level of Independence: Needs assistance   Gait / Transfers Assistance Needed: ambulates with RW  ADL's / Homemaking Assistance Needed: has an aide that comes 2x/week to assist pt with bathing, can dress and feed herself        Hand Dominance   Dominant Hand: Right    Extremity/Trunk Assessment   Upper Extremity Assessment Upper Extremity Assessment: RUE deficits/detail RUE Deficits / Details: MMT revealed 3+/5 for shoulder flexion, shoulder abduction, elbow flexion and elbow extension. Grip strength is diminished as compared to L. Pt also with diminished sensation to light touch throughout R UE/hand/fingers. RUE Sensation: decreased light touch RUE Coordination: decreased fine motor;decreased gross motor    Lower Extremity Assessment Lower Extremity Assessment: Overall WFL for tasks assessed    Cervical / Trunk Assessment Cervical / Trunk Assessment: Kyphotic  Communication   Communication: No difficulties  Cognition Arousal/Alertness: Awake/alert Behavior During Therapy: WFL for tasks assessed/performed Overall Cognitive Status: Within Functional Limits for tasks assessed                                        General Comments      Exercises     Assessment/Plan    PT Assessment Patient needs continued PT services  PT Problem List Decreased strength;Decreased balance;Decreased mobility;Decreased coordination;Decreased knowledge of use of DME;Decreased safety awareness       PT Treatment Interventions DME instruction;Gait training;Stair training;Functional mobility training;Therapeutic activities;Therapeutic exercise;Balance training;Neuromuscular re-education;Patient/family education    PT Goals (Current goals can be found in the Care Plan section)  Acute Rehab PT Goals Patient Stated Goal: return to PLOF PT Goal Formulation: With patient Time For Goal Achievement: 10/16/16 Potential to Achieve Goals: Good    Frequency Min 4X/week    Barriers to discharge        Co-evaluation               AM-PAC PT "6 Clicks" Daily Activity  Outcome Measure Difficulty turning over in bed (including adjusting bedclothes, sheets and blankets)?: A Little Difficulty moving from lying on back to sitting on the side of the bed? : A Little Difficulty sitting down on and standing up from a chair with arms (e.g., wheelchair, bedside commode, etc,.)?: A Little Help needed moving to and from a bed to chair (including a wheelchair)?: A Little Help needed walking in hospital room?: A Little Help needed climbing 3-5 steps with a railing? : A Lot 6 Click Score: 17    End of Session Equipment Utilized During Treatment: Gait belt Activity Tolerance: Patient tolerated treatment well Patient left: in chair;with call bell/phone within reach;with chair alarm set;Other (comment) (Hospice SW present) Nurse Communication: Mobility status PT Visit Diagnosis: Other abnormalities of gait and mobility (R26.89);Other symptoms and signs involving the nervous system (R29.898)    Time: 1127-1150 PT Time Calculation (min) (ACUTE ONLY): 23 min   Charges:   PT Evaluation $PT Eval Moderate Complexity: 1 Procedure PT Treatments $Gait Training: 8-22 mins   PT G Codes:   PT G-Codes **NOT FOR INPATIENT CLASS** Functional Assessment Tool Used: AM-PAC 6 Clicks Basic Mobility;Clinical judgement Functional Limitation: Mobility:  Walking and moving around Mobility: Walking and Moving Around Current Status [phone]): At least 40 percent but less than 60 percent impaired, limited or restricted Mobility: Walking and Moving Around Goal Status [phone]): At least 1 percent but less than 20 percent impaired, limited or restricted    [facility], [place], DPT [place] 10/02/2016, 12:56 PM

## 2016-10-02 NOTE — Progress Notes (Signed)
STROKE TEAM PROGRESS NOTE   SUBJECTIVE (INTERVAL HISTORY)  Patient is sitting up which in a bedside chair. No family at the bedside. She states her right-sided weakness and incoordination improving but not back to baseline. She has a history of lung cancer and has chosen palliative care and refused chemotherapy recently.   OBJECTIVE Temp:  [98.5 F (36.9 C)-99.1 F (37.3 C)] 99 F (37.2 C) (05/31 1035) Pulse Rate:  [38-88] 77 (05/31 1035) Cardiac Rhythm: Normal sinus rhythm (05/31 0700) Resp:  [16-27] 20 (05/31 1035) BP: (102-188)/(55-86) 139/55 (05/31 1035) SpO2:  [95 %-100 %] 99 % (05/31 1035)  CBC:  Recent Labs Lab 10/01/16 1602 10/01/16 1610  WBC 7.7  --   NEUTROABS 4.9  --   HGB 12.1 13.6  HCT 38.6 40.0  MCV 96.0  --   PLT 179  --     Basic Metabolic Panel:  Recent Labs Lab 10/01/16 1602 10/01/16 1610  NA 137 139  K 5.0 4.9  CL 107 105  CO2 20*  --   GLUCOSE 240* 231*  BUN 22* 25*  CREATININE 1.81* 1.70*  CALCIUM 9.5  --     Lipid Panel:    Component Value Date/Time   CHOL 182 10/02/2016 0249   TRIG 90 10/02/2016 0249   HDL 53 10/02/2016 0249   CHOLHDL 3.4 10/02/2016 0249   VLDL 18 10/02/2016 0249   LDLCALC 111 (H) 10/02/2016 0249   HgbA1c:  Lab Results  Component Value Date   HGBA1C 9.0 (H) 09/01/2016    PHYSICAL EXAM Pleasant elderly African-American lady currently not in distress. . Afebrile. Head is nontraumatic. Neck is supple without bruit.    Cardiac exam no murmur or gallop. Lungs are clear to auscultation. Distal pulses are well felt. Neurological Exam :  Awake alert oriented 3 with normal speech and language function. No aphasia or apraxia dysarthria. Follows commands well. Extraocular moments are full range without nystagmus. Blinks to threat bilaterally. Fundi were not visualized. Vision acuity seems adequate. Face is symmetric without weakness. Tongue is midline. Motor system exam reveals mild right upper extremity drift and weakness  4/5. Symmetric lower extremity strength. Normal left upper extremity strength. Right finger-to-nose dysmetria. Fine finger movements are diminished on the right. Orbits left over right upper extremity. Gait was not tested. Sensation is intact. Deep tendon reflexes are symmetric. Plantars are downgoing. ASSESSMENT/PLAN Erica Jimenez is a 83 y.o. female with history of lung cancer with home hospice, remote hx of bladder CA, diabetes mellitus, hypertension, chronic systolic heart failure, CKD, CVA, AF not on AC and recent admission for GI bleed and sepsis presenting with R sided weakness. She did not receive IV t-PA.   Stroke:  2 small L frontal infarcts embolic secondary to known atrial fibrillation source  Code Stroke CT no acute stroke. B chronic ischemia. Aspects 10.    MRI  Small L frontal infarct. Tiny L temporal infarct. Old biparietal and L frontal lobe infarcts, old B occipital and small cerebellar infarcts.  MRA  No ELVO. B ICA atherosclerosis  Carotid Doppler  B ICA 1-39% stenosis, VAs antegrade   2D Echo  pending  LE doppler pending  LDL 111  HgbA1c 9.0  SCDs for VTE prophylaxis    Diet heart healthy/carb modified Room service appropriate? Yes; Fluid consistency: Thin  aspirin 325 mg daily and clopidogrel 75 mg daily prior to admission, now on   plavix  Therapy recommendations:  CIR  Disposition:  pending  (followed by [facility]  PTA, lung cancer)  Atrial Fibrillation  Home anticoagulation:  none   Not an AC candidate given recent GIB  Hypertension  Stable Permissive hypertension (OK if < 220/120) but gradually normalize in 5-7 days Long-term BP goal normotensive  Hyperlipidemia  Home meds:  lipitor 80, resumed in hospital  LDL 111  Continue statin at discharge  Diabetes  HgbA1c 9.0, goal < 7.0  Uncontrolled  Other Stroke Risk Factors  Advanced age  UDS positive for opiates and THC  ETOH level < 11  Hx stroke/TIA  08/2016 silent L parietal  subcortical infarct felt to be due to hypercoagulability from cancer  Coronary artery disease  Chronic systolic HF  Aortic stenosis  PAD  Other Active Problems  Lung cancer, stage 4 non-small cell, sq cell w/p chemo March, now on home hospice  COPD  Elevated troponins  Stage 4 CKD  H/o acute blood loss anemia  Hospital day # 0  I have personally examined this patient, reviewed notes, independently viewed imaging studies, participated in medical decision making and plan of care.ROS completed by me personally and pertinent positives fully documented  I have made any additions or clarifications directly to the above note.  She has bilateral embolic left frontal infarct likely secondary to atrial fibrillation. She has had recent GI bleed and is not a long-term anticoagulation candidate. Recommend change aspirin to Plavix. Discussed with patient and Dr. Allyson Sabal and answered questions. Greater than 50% time during this 35 minute visit was spent on counseling and coordination of care about her embolic stroke, atrial fibrillation, risk benefit of anticoagulation and answering questions Antony Contras, MD Medical Director [place] Pager: [phone] 10/02/2016 4:04 PM   To contact Stroke Continuity provider, please refer to [email or web address] After hours, contact General Neurology

## 2016-10-02 NOTE — Progress Notes (Signed)
Rehab Admissions Coordinator Note:  Patient was screened by Cleatrice Burke for appropriateness for an Inpatient Acute Rehab Consult per PT recommendations.   At this time, we are recommending Inpatient Rehab consult if pt would like to pursue admission.  Cleatrice Burke 10/02/2016, 2:12 PM  I can be reached at [phone].

## 2016-10-03 ENCOUNTER — Inpatient Hospital Stay (HOSPITAL_COMMUNITY)

## 2016-10-03 DIAGNOSIS — I639 Cerebral infarction, unspecified: Secondary | ICD-10-CM

## 2016-10-03 LAB — GLUCOSE, CAPILLARY
GLUCOSE-CAPILLARY: 156 mg/dL — AB (ref 65–99)
GLUCOSE-CAPILLARY: 48 mg/dL — AB (ref 65–99)
Glucose-Capillary: 81 mg/dL (ref 65–99)
Glucose-Capillary: 187 mg/dL — ABNORMAL HIGH (ref 65–99)
Glucose-Capillary: 256 mg/dL — ABNORMAL HIGH (ref 65–99)

## 2016-10-03 LAB — COMPREHENSIVE METABOLIC PANEL
ALBUMIN: 2.7 g/dL — AB (ref 3.5–5.0)
ALT: 11 U/L — ABNORMAL LOW (ref 14–54)
ANION GAP: 7 (ref 5–15)
AST: 21 U/L (ref 15–41)
Alkaline Phosphatase: 78 U/L (ref 38–126)
BUN: 16 mg/dL (ref 6–20)
CHLORIDE: 111 mmol/L (ref 101–111)
CO2: 21 mmol/L — AB (ref 22–32)
Calcium: 9 mg/dL (ref 8.9–10.3)
Creatinine, Ser: 1.54 mg/dL — ABNORMAL HIGH (ref 0.44–1.00)
GFR calc Af Amer: 35 mL/min — ABNORMAL LOW (ref >60)
GFR calc non Af Amer: 30 mL/min — ABNORMAL LOW (ref >60)
GLUCOSE: 95 mg/dL (ref 65–99)
POTASSIUM: 3.9 mmol/L (ref 3.5–5.1)
SODIUM: 139 mmol/L (ref 135–145)
Total Bilirubin: 0.7 mg/dL (ref 0.3–1.2)
Total Protein: 5.7 g/dL — ABNORMAL LOW (ref 6.5–8.1)

## 2016-10-03 LAB — CBC
HCT: 34.2 % — ABNORMAL LOW (ref 36.0–46.0)
Hemoglobin: 10.6 g/dL — ABNORMAL LOW (ref 12.0–15.0)
MCH: 29.5 pg (ref 26.0–34.0)
MCHC: 31 g/dL (ref 30.0–36.0)
MCV: 95.3 fL (ref 78.0–100.0)
PLATELETS: 180 10*3/uL (ref 150–400)
RBC: 3.59 MIL/uL — ABNORMAL LOW (ref 3.87–5.11)
RDW: 16.7 % — AB (ref 11.5–15.5)
WBC: 5 10*3/uL (ref 4.0–10.5)

## 2016-10-03 LAB — HEMOGLOBIN A1C
HEMOGLOBIN A1C: 7.6 % — AB (ref 4.8–5.6)
Mean Plasma Glucose: 171 mg/dL

## 2016-10-03 MED ORDER — DEXTROSE 50 % IV SOLN
25.0000 mL | Freq: Once | INTRAVENOUS | Status: AC
  Administered 2016-10-03: 25 mL via INTRAVENOUS
  Filled 2016-10-03: qty 50

## 2016-10-03 MED ORDER — ENSURE ENLIVE PO LIQD
237.0000 mL | Freq: Three times a day (TID) | ORAL | Status: DC
  Administered 2016-10-04: 237 mL via ORAL

## 2016-10-03 MED ORDER — ASPIRIN 81 MG PO CHEW
81.0000 mg | CHEWABLE_TABLET | Freq: Every day | ORAL | Status: DC
  Administered 2016-10-03: 81 mg via ORAL
  Filled 2016-10-03: qty 1

## 2016-10-03 MED ORDER — FUROSEMIDE 40 MG PO TABS
40.0000 mg | ORAL_TABLET | Freq: Every day | ORAL | Status: DC
  Administered 2016-10-03 – 2016-10-05 (×3): 40 mg via ORAL
  Filled 2016-10-03 (×3): qty 1

## 2016-10-03 MED ORDER — ISOSORBIDE MONONITRATE ER 60 MG PO TB24
60.0000 mg | ORAL_TABLET | Freq: Every day | ORAL | Status: DC
  Administered 2016-10-03 – 2016-10-04 (×2): 60 mg via ORAL
  Filled 2016-10-03: qty 1

## 2016-10-03 NOTE — Progress Notes (Signed)
(  Late Entry) Pt.'s daughter called during change of shift, upset, per secretary Estill Bamberg saying she doesn't know which room her mother is in and she wants to know immediately where she is and what is going on. Daughter stated patient is in room 5111. When staff stated this room does not exist and we would figure out where she is the daughter stated "I want to talk to someone now!" Phone call transferred to Mardene Celeste, [place] number but the phone call bounced back. CN Phyl took the phone to Mardene Celeste and Mardene Celeste started talking to her but the phone call shortly became disconnected.   This Probation officer as Camera operator received 2nd phone call from the daughter who was irate and stated "Your nurses need to get it together, they keep hanging up on me." This Probation officer explained to her that the phone call got disconnected and I would take the phone to Mardene Celeste to speak with her. She then stated " I am sick of y'alls excuses, someone better tell me how my mother is now." Phone given to Mardene Celeste and she provided an update on the patient. Per Mardene Celeste patient's daughter upset about asking for verification of identity and about not receiving information immediately.

## 2016-10-03 NOTE — Progress Notes (Addendum)
@IPLOG         PROGRESS NOTE                                                                                                                                                                                                             Patient Demographics:    Erica Jimenez, is a 83 y.o. female, DOB - 1930-04-04, ZOX:[id number]  Admit date - 10/01/2016   Admitting Physician Hosie Poisson, MD  Outpatient Primary MD for the patient is [name] (Inactive)  LOS - 1  Chief Complaint  Patient presents with  . Code Stroke       Brief Narrative   Erica Jimenez is a 83 y.o. female with medical history significant of  CAD, PVD, HYPERTENSION, CVA, DM, COPD, right side lunc cancer, follows with [name], presents to day to ED as code stroke as she reports right arm weakness and gait abnormality. She noticed the right arm weakness around 12 pm.. She is currently on hospice but wanted to be treated for all treatable conditions. tPA was not given as risk is greater than benefit and pt did not want to go ahead with the tPA. She also reports 3 to 4 bowel movements earlier today. She denies any other complaints.     Subjective:    [name] today has, No headache, No chest pain, No abdominal pain - No Nausea, No new weakness tingling or numbness, No Cough - SOB. R arm weakness.    Assessment  & Plan :     1.R.sided weakness Acute small LEFT frontal lobe nonhemorrhagic infarct. Subacute subcentimeter LEFT temporal lobe infarct though - has H/O Afib not on Anticoagulation due to risk of GI bleeding and falls. She actually had a major GI bleed 1 month ago requiring 4 units of packed RBC transfusion. She is also lung cancer patient who was on home hospice.  At this time she was seen by neurology, currently on Plavix, statin along with twice a day PPI, risks and benefits explained to the daughter and patient in detail, they agree with the plan. Full stroke workup underway. Likely to be placed.   2.  Stage III non-small cell lung cancer. Under care of Dr. [name], she was home hospice, postdischarge follow [name] and hospice.  3. Recent history of upper GI bleed. Twice a day PPI and monitor.  4. Paroxysmal atrial fibrillation [place] vasc 2 score of at least 6. Not on rate controlling agents, not on anticoagulation due to fall risk and GI platelet risk,  continue Plavix for now.  5. Dyslipidemia. Statin maximized. LDL was above goal.  6. Hypothyroidism - on Synthroid continue   7. Chronic systolic and diastolic CHF EF 40-81%. Currently compensated home dose Lasix and Imdur resumed, will monitor. Not on ACE/ARB due to renal insufficiency.   8. CKD 3. Baseline creatinine around 1.8 to 1.9. At baseline  9. Non-ACS pattern flat rise in troponin. No chest pain, EKG nonacute, echocardiogram shows depressed EF and wall motion abnormality, with her lung cancer, hospice status, recent GI bleed she is not a candidate for invasive procedures or interventions, continue Plavix and statin.  10Left lower extremity below knee DVT. Plan discussed with vascular surgeon Dr. [name], patient, patient's daughter. Risks and benefits explained. In the light of her recent GI bleed, poor functional status, lung cancer and hospice status. Best approach will be to repeat ultrasound in 3-4 weeks, if the clot is not propagating [name], if awaiting IVC filter.   11. DM type II. On Lantus and sliding scale.  Lab Results  Component Value Date   HGBA1C 7.6 (H) 10/02/2016   CBG (last 3)   Recent Labs  10/02/16 2054 10/03/16 0613 10/03/16 1217  GLUCAP 165* 81 187*      Diet : Diet heart healthy/carb modified Room service appropriate? Yes; Fluid consistency: Thin    Family Communication  :  Daughter x 4 over the phone (she was extremely rude throughout, she was rude to the staff last night per Agricultural consultant and Special educational needs teacher)  Code Status :  DNR  Disposition Plan  : TBD   Consults  :  Neuro  Procedures   :   Vascular Ultrasound  Bilateral lower extremity venous duplex has been completed.  Preliminary findings: Findings suggest deep vein thrombosis in the left tibioperonal trunk and posterior tibial veins of the proximal calf. No evidence of deep vein thrombosis in the right lower extremity.  Incidentally noted: low level echoes in the right femoral artery, soft plaque vs thrombosis.  TTE   - Left ventricle: The cavity size was normal. Wall thickness was normal. Systolic function was moderately reduced. The estimated ejection fraction was in the range of 35% to 40%. Anterior, anteroseptal, apical and distal anteroapical severe hypokinesis to akinesis - suggestive of LAD territroy ischemia/infarct. Doppler parameters are consistent with abnormal left ventricular relaxation (grade 1 diastolic dysfunction). The E/e&' ratio is >15, suggesting elevated LV filling pressure. - Aortic valve: Trileaflet. Sclerosis without stenosis. There was trivial regurgitation. - Mitral valve: Mildly thickened leaflets . There was mild regurgitation. - Left atrium: The atrium was mildly dilated. - Inferior vena cava: The vessel was normal in size. The respirophasic diameter changes were in the normal range (= 50%), consistent with normal central venous pressure.  Impressions:   Compared to a prior study in 08/2016, the LVEF is lower at 35-40% with persistent LAD territory wall motion abnormality. There is coarse apical trabeculation, but no obvious mural thrombus.   DVT Prophylaxis  :   SCDs    Lab Results  Component Value Date   PLT 180 10/03/2016    Inpatient Medications  Scheduled Meds: . atorvastatin  80 mg Oral QPC supper  . brinzolamide  1 drop Both Eyes BID  . calcitRIOL  0.25 mcg Oral Q M,W,F  . citalopram  20 mg Oral QPC supper  . clopidogrel  75 mg Oral Daily  . feeding supplement  1 Container Oral TID BM  . feeding supplement (ENSURE ENLIVE)  237 mL Oral BID  BM  . folic acid  1 mg Oral Daily  .  insulin aspart  0-5 Units Subcutaneous QHS  . insulin aspart  0-9 Units Subcutaneous TID WC  . insulin glargine  15 Units Subcutaneous QAC breakfast  . latanoprost  1 drop Both Eyes QHS  . levothyroxine  50 mcg Oral QAC breakfast  . pantoprazole  40 mg Oral BID   Continuous Infusions: PRN Meds:.acetaminophen **OR** [DISCONTINUED] acetaminophen (TYLENOL) oral liquid 160 mg/5 mL **OR** [DISCONTINUED] acetaminophen, nitroGLYCERIN, senna-docusate  Antibiotics  :    Anti-infectives    None         Objective:   Vitals:   10/03/16 0122 10/03/16 0425 10/03/16 0936 10/03/16 1319  BP: (!) 152/96 (!) 148/98 (!) 166/62 (!) 147/72  Pulse: 86 63 61 70  Resp: 18 18 16 20   Temp: 98.4 F (36.9 C) 98.6 F (37 C) 98.6 F (37 C) 99.1 F (37.3 C)  TempSrc: Oral Oral Oral Oral  SpO2: 99% 98% 100% 100%  Weight:   69.5 kg (153 lb 3.2 oz)   Height:   5\' 4"  (1.626 m)     Wt Readings from Last 3 Encounters:  10/03/16 69.5 kg (153 lb 3.2 oz)  09/08/16 70.4 kg (155 lb 3.2 oz)  08/26/16 63.5 kg (140 lb 1.6 oz)     Intake/Output Summary (Last 24 hours) at 10/03/16 1411 Last data filed at 10/03/16 1319  Gross per 24 hour  Intake          1868.33 ml  Output                0 ml  Net          1868.33 ml     Physical Exam  Awake Alert,  No new F.N deficits, R arm mildly weak, Normal affect [place].AT,PERRAL Supple Neck,No JVD, No cervical lymphadenopathy appriciated.  Symmetrical Chest wall movement, Good air movement bilaterally, CTAB RRR,No Gallops,Rubs or new Murmurs, No Parasternal Heave +ve B.Sounds, Abd Soft, No tenderness, No organomegaly appriciated, No rebound - guarding or rigidity. No Cyanosis, Clubbing or edema, No new Rash or bruise       Data Review:    CBC  Recent Labs Lab 10/01/16 1602 10/01/16 1610 10/03/16 0816  WBC 7.7  --  5.0  HGB 12.1 13.6 10.6*  HCT 38.6 40.0 34.2*  PLT 179  --  180  MCV 96.0  --  95.3  MCH 30.1  --  29.5  MCHC 31.3  --  31.0  RDW 16.5*   --  16.7*  LYMPHSABS 1.7  --   --   MONOABS 0.8  --   --   EOSABS 0.2  --   --   BASOSABS 0.0  --   --     Chemistries   Recent Labs Lab 10/01/16 1602 10/01/16 1610 10/03/16 0816  NA 137 139 139  K 5.0 4.9 3.9  CL 107 105 111  CO2 20*  --  21*  GLUCOSE 240* 231* 95  BUN 22* 25* 16  CREATININE 1.81* 1.70* 1.54*  CALCIUM 9.5  --  9.0  AST 25  --  21  ALT 14  --  11*  ALKPHOS 94  --  78  BILITOT 0.7  --  0.7   ------------------------------------------------------------------------------------------------------------------  Recent Labs  10/02/16 0249  CHOL 182  HDL 53  LDLCALC 111*  TRIG 90  CHOLHDL 3.4    Lab Results  Component Value Date   HGBA1C 7.6 (H)  10/02/2016   ------------------------------------------------------------------------------------------------------------------ No results for input(s): TSH, T4TOTAL, T3FREE, THYROIDAB in the last 72 hours.  Invalid input(s): FREET3 ------------------------------------------------------------------------------------------------------------------ No results for input(s): VITAMINB12, FOLATE, FERRITIN, TIBC, IRON, RETICCTPCT in the last 72 hours.  Coagulation profile  Recent Labs Lab 10/01/16 1602  INR 1.00    No results for input(s): DDIMER in the last 72 hours.  Cardiac Enzymes  Recent Labs Lab 10/01/16 2235 10/02/16 0249 10/02/16 0746  TROPONINI 0.25* 0.26* 0.26*   ------------------------------------------------------------------------------------------------------------------ No results found for: BNP  Micro Results No results found for this or any previous visit (from the past 240 hour(s)).  Radiology Reports Dg Chest 2 View  Result Date: 10/01/2016 CLINICAL DATA:  Acute onset of right upper extremity weakness. Initial encounter. EXAM: CHEST  2 VIEW COMPARISON:  Chest radiograph from 09/04/2016 FINDINGS: The lungs are well-aerated. Minimal residual right basilar airspace opacity may  reflect mild residual pneumonia. Mild scarring is noted at the right lung apex. There is no evidence of pleural effusion or pneumothorax. The heart is mildly enlarged. No acute osseous abnormalities are seen. IMPRESSION: 1. Minimal residual right basilar airspace opacity may reflect mild residual pneumonia, improved from the prior study. 2. Mild scarring at the right lung apex. 3. Mild cardiomegaly. Electronically Signed   By: Garald Balding M.D.   On: 10/01/2016 22:34   [name] Contrast  Result Date: 10/01/2016 CLINICAL DATA:  RIGHT arm weakness and gait abnormality at noon today. History of hypertension, stroke, diabetes, lung cancer. EXAM: MRI HEAD WITHOUT CONTRAST MRA HEAD WITHOUT CONTRAST TECHNIQUE: Multiplanar, multiecho pulse sequences of the brain and surrounding structures were obtained without intravenous contrast. Angiographic images of the head were obtained using MRA technique without contrast. COMPARISON:  CT HEAD Oct 01, 2016 at 1612 hours and MRI head August 16, 2016 and MRA head August 17, 2016 FINDINGS: MRI HEAD FINDINGS BRAIN: Curvilinear reduced diffusion LEFT posterior frontal lobe precentral gyrus extending to centrum semiovale, with low ADC values. Residual subcentimeter reduced diffusion LEFT temporal lobe, persistent low ADC values. No susceptibility artifact to suggest hemorrhage. No midline shift, mass effect or masses. Old RIGHT occipital lobe, biparietal, LEFT frontal lobe encephalomalacia. Ex vacuo dilatation of subjacent ventricles. No hydrocephalus. Prominent LEFT greater than RIGHT thalami perivascular spaces associated with chronic hypertension. Old small bilateral cerebellar infarcts. No abnormal extra-axial fluid collections. VASCULAR: Normal major intracranial vascular flow voids present at skull base. SKULL AND UPPER CERVICAL SPINE: No abnormal sellar expansion. No suspicious calvarial bone marrow signal. Craniocervical junction maintained. SINUSES/ORBITS: Trace paranasal  sinus mucosal thickening. The included ocular globes and orbital contents are non-suspicious. Status post bilateral ocular lens implants. OTHER: Patient is edentulous. MRA HEAD FINDINGS ANTERIOR CIRCULATION: Flow related enhancement within bilateral internal carotid artery's; luminal irregularities of the carotid siphons with mild tandem stenosis due to calcific atherosclerosis as seen on today's head CT. RIGHT A1 segment is dominant, patent anterior communicating with normal flow related enhancement bilateral anterior cerebral artery's. Normal flow related enhancement bilateral middle cerebral artery's. No large vessel occlusion, high-grade stenosis, abnormal luminal irregularity, aneurysm. POSTERIOR CIRCULATION: LEFT vertebral artery is dominant. Basilar artery is patent, with normal flow related enhancement of the main branch vessels. Normal flow related enhancement of the posterior cerebral arteries. No large vessel occlusion, high-grade stenosis, abnormal luminal irregularity, aneurysm. ANATOMIC VARIANTS: None. Source images and MIP images were reviewed. IMPRESSION: MRI HEAD: Acute small LEFT frontal lobe nonhemorrhagic infarct. Subacute subcentimeter LEFT temporal lobe infarct though, considering ADC values, acute component is possible. Old biparietal  and LEFT frontal lobe infarcts (MCA territories), old bioccipital lobe infarcts (PCA territories). Old small cerebellar infarcts. MRA HEAD: No emergent large vessel occlusion or severe stenosis. Atherosclerosis resulting in mild stenosis bilateral carotid siphons ICAs. Electronically Signed   By: Elon Alas M.D.   On: 10/01/2016 22:34   Dg Chest Port 1 View  Result Date: 09/04/2016 CLINICAL DATA:  PICC line placement EXAM: PORTABLE CHEST 1 VIEW COMPARISON:  09/01/2016 FINDINGS: Stable cardiomegaly with aortic atherosclerosis. New left-sided PICC line tip terminates in the distal SVC. New confluent airspace opacities are seen in the right lower lobe  concerning for pneumonia. Apical pleuroparenchymal thickening and scarring seen at the right lung apex. No acute osseous abnormality. IMPRESSION: 1. PICC line tip in the distal SVC. 2. New confluent airspace opacity in the right lung base suspicious for pneumonia. 3. Cardiomegaly with aortic atherosclerosis. Electronically Signed   By: Ashley Royalty M.D.   On: 09/04/2016 21:11   Dg Abd 2 Views  Result Date: 09/03/2016 CLINICAL DATA:  Acute bleeding gastric ulcer. EXAM: ABDOMEN - 2 VIEW COMPARISON:  None. FINDINGS: No free air is identified. Three biopsy clips are seen in the right upper quadrant projecting over the expected location of the distal ascending and proximal transverse colon and one in the right lower quad. Scattered air containing small and large bowel loops without obstruction are noted. No pneumothorax. Bone-on-bone apposition of the left hip with subchondral sclerosis and degenerative subchondral cystic change is identified. Aortoiliac atherosclerosis is noted. Injection granulomas project over the right gluteal region. No radio-opaque calculi or other significant radiographic abnormality is seen. IMPRESSION: 1. No free air identified. Scattered air containing small large bowel loops without obstruction. 2. Severe osteoarthritic joint space narrowing of the left hip with bone-on-bone apposition. 3. Three biopsy clips are seen in the right upper quadrant of the abdomen and one in the right lower quadrant. Electronically Signed   By: Ashley Royalty M.D.   On: 09/03/2016 19:31   Mr Jodene Nam Head/brain TK Cm  Result Date: 10/01/2016 CLINICAL DATA:  RIGHT arm weakness and gait abnormality at noon today. History of hypertension, stroke, diabetes, lung cancer. EXAM: MRI HEAD WITHOUT CONTRAST MRA HEAD WITHOUT CONTRAST TECHNIQUE: Multiplanar, multiecho pulse sequences of the brain and surrounding structures were obtained without intravenous contrast. Angiographic images of the head were obtained using MRA  technique without contrast. COMPARISON:  CT HEAD Oct 01, 2016 at 1612 hours and MRI head August 16, 2016 and MRA head August 17, 2016 FINDINGS: MRI HEAD FINDINGS BRAIN: Curvilinear reduced diffusion LEFT posterior frontal lobe precentral gyrus extending to centrum semiovale, with low ADC values. Residual subcentimeter reduced diffusion LEFT temporal lobe, persistent low ADC values. No susceptibility artifact to suggest hemorrhage. No midline shift, mass effect or masses. Old RIGHT occipital lobe, biparietal, LEFT frontal lobe encephalomalacia. Ex vacuo dilatation of subjacent ventricles. No hydrocephalus. Prominent LEFT greater than RIGHT thalami perivascular spaces associated with chronic hypertension. Old small bilateral cerebellar infarcts. No abnormal extra-axial fluid collections. VASCULAR: Normal major intracranial vascular flow voids present at skull base. SKULL AND UPPER CERVICAL SPINE: No abnormal sellar expansion. No suspicious calvarial bone marrow signal. Craniocervical junction maintained. SINUSES/ORBITS: Trace paranasal sinus mucosal thickening. The included ocular globes and orbital contents are non-suspicious. Status post bilateral ocular lens implants. OTHER: Patient is edentulous. MRA HEAD FINDINGS ANTERIOR CIRCULATION: Flow related enhancement within bilateral internal carotid artery's; luminal irregularities of the carotid siphons with mild tandem stenosis due to calcific atherosclerosis as seen on today's head CT.  RIGHT A1 segment is dominant, patent anterior communicating with normal flow related enhancement bilateral anterior cerebral artery's. Normal flow related enhancement bilateral middle cerebral artery's. No large vessel occlusion, high-grade stenosis, abnormal luminal irregularity, aneurysm. POSTERIOR CIRCULATION: LEFT vertebral artery is dominant. Basilar artery is patent, with normal flow related enhancement of the main branch vessels. Normal flow related enhancement of the posterior  cerebral arteries. No large vessel occlusion, high-grade stenosis, abnormal luminal irregularity, aneurysm. ANATOMIC VARIANTS: None. Source images and MIP images were reviewed. IMPRESSION: MRI HEAD: Acute small LEFT frontal lobe nonhemorrhagic infarct. Subacute subcentimeter LEFT temporal lobe infarct though, considering ADC values, acute component is possible. Old biparietal and LEFT frontal lobe infarcts (MCA territories), old bioccipital lobe infarcts (PCA territories). Old small cerebellar infarcts. MRA HEAD: No emergent large vessel occlusion or severe stenosis. Atherosclerosis resulting in mild stenosis bilateral carotid siphons ICAs. Electronically Signed   By: Elon Alas M.D.   On: 10/01/2016 22:34   Ct Head Code Stroke W/o Cm  Result Date: 10/01/2016 CLINICAL DATA:  Code stroke. 83 year old female with right side weakness and abnormal gait. Last seen normal 1230 hours. EXAM: CT HEAD WITHOUT CONTRAST TECHNIQUE: Contiguous axial images were obtained from the base of the skull through the vertex without intravenous contrast. COMPARISON:  Head CT 05/11/2010. Brain MRI and intracranial MRA 08/16/2016 FINDINGS: Brain: Scattered chronic cortical encephalomalacia in the bilateral posterior MCA, anterior left MCA, and right PCA territories. Small chronic infarcts in the left cerebellar hemisphere. No cortically based acute infarct identified. No acute intracranial hemorrhage identified. No midline shift, mass effect, or evidence of intracranial mass lesion. Stable ventricle size and configuration. Vascular: Extensive Calcified atherosclerosis at the skull base. No suspicious intracranial vascular hyperdensity. Skull: No acute osseous abnormality identified. Hyperostosis of the calvarium, normal variant. Sinuses/Orbits: Visualized paranasal sinuses and mastoids are stable and well pneumatized. Other: No acute orbit or scalp soft tissue findings. ASPECTS [facility] Stroke Program Early CT Score) - Ganglionic  level infarction (caudate, lentiform nuclei, internal capsule, insula, M1-M3 cortex): 7 - Supraganglionic infarction (M4-M6 cortex): 3 Total score (0-10 with 10 being normal): 10 IMPRESSION: 1. Bilateral chronic ischemic disease appears stable since 08/16/2016. No acute cortically based infarct or acute intracranial hemorrhage identified. 2. ASPECTS is 10. 3. The above was relayed via text pager to E. Lindzen on 10/01/2016 at 16:19 . Electronically Signed   By: Genevie Ann M.D.   On: 10/01/2016 16:19    Time Spent in minutes  30   Lala Lund M.D on 10/03/2016 at 2:11 PM  Between 7am to 7pm - Pager - [phone] ( page via [email or web address], text pages only, please mention full 10 digit call back number). After 7pm go to [email or web address] - password [email or web address]

## 2016-10-03 NOTE — Progress Notes (Signed)
Occupational Therapy Treatment Patient Details Name: Erica Jimenez MRN: [id number] DOB: 18-Jun-1929 Today's Date: 10/03/2016    History of present illness Pt is an 83 y/o female admitted secondary to R UE weakness and gait abnormality. MRI revealed an acute small L frontal lobe nonhemorrhagic infarct. PMH including but not limited to bladder cancer s/p resection and BCG treatment, R sided lung cancer, CAD, CKD, HTN, DM, COPD, PAD and hx of MI.   OT comments  Pt making progress towards OT goals this session. OT established written exercise program for RUE. Pt very willing and excited to participate. At  The end of session, Pt got sweaty and requested to lie back down. RN entered room at this time and together with OT we took vitals. RN believe that Pt broke low grade fever as vitals were [facility]. OT will continue to follow for acute rehab and CIR still required for Pt to return to PLOF. Next session to functional use of RUE during grooming task and re-evaluation of grasp for tasks like eating/writing/holding toothbrush to see if improving.   Follow Up Recommendations  CIR;Supervision/Assistance - 24 hour    Equipment Recommendations  Other (comment) (defer to next venue)    Recommendations for Other Services Rehab consult    Precautions / Restrictions Precautions Precautions: Fall Restrictions Weight Bearing Restrictions: No       Mobility Bed Mobility Overal bed mobility: Needs Assistance Bed Mobility: Sit to Supine       Sit to supine: Min guard   General bed mobility comments: close min guard for safety, increased time and effort especially with returning bilateral LEs onto bed  Transfers                 General transfer comment: transfers not attempted this session, exercises performed sitting EOB    Balance Overall balance assessment: Needs assistance Sitting-balance support: Feet supported Sitting balance-Leahy Scale: Good                                      ADL either performed or assessed with clinical judgement   ADL                                               Vision       Perception     Praxis      Cognition Arousal/Alertness: Awake/alert Behavior During Therapy: WFL for tasks assessed/performed Overall Cognitive Status: Within Functional Limits for tasks assessed                                          Exercises Exercises: General Upper Extremity;Other exercises General Exercises - Upper Extremity Shoulder Flexion: AROM;Right;10 reps;Seated Shoulder Extension: AROM;Right;10 reps;Seated Shoulder ABduction: AROM;Right;10 reps;Seated Elbow Flexion: AROM;Right;10 reps;Seated Wrist Flexion: AROM;Right;10 reps;Seated Wrist Extension: AROM;Right;10 reps;Seated Digit Composite Flexion: AROM;Right;10 reps;Seated Other Exercises Other Exercises: Fine Motor Exercises Handout activities 1-5   Shoulder Instructions       General Comments      Pertinent Vitals/ Pain       Pain Assessment: No/denies pain  Home Living  Prior Functioning/Environment              Frequency  Min 3X/week        Progress Toward Goals  OT Goals(current goals can now be found in the care plan section)  Progress towards OT goals: Progressing toward goals  Acute Rehab OT Goals Patient Stated Goal: return to PLOF OT Goal Formulation: With patient Time For Goal Achievement: 10/16/16 Potential to Achieve Goals: Good  Plan Discharge plan remains appropriate;Frequency remains appropriate    Co-evaluation                 AM-PAC PT "6 Clicks" Daily Activity     Outcome Measure   Help from another person eating meals?: A Little Help from another person taking care of personal grooming?: A Little Help from another person toileting, which includes using toliet, bedpan, or urinal?: A Little Help from another person bathing  (including washing, rinsing, drying)?: A Little Help from another person to put on and taking off regular upper body clothing?: A Little Help from another person to put on and taking off regular lower body clothing?: A Little 6 Click Score: 18    End of Session Equipment Utilized During Treatment: Gait belt  OT Visit Diagnosis: Unsteadiness on feet (R26.81);Muscle weakness (generalized) (M62.81);Hemiplegia and hemiparesis Hemiplegia - Right/Left: Right Hemiplegia - dominant/non-dominant: Dominant Hemiplegia - caused by: Cerebral infarction   Activity Tolerance Patient tolerated treatment well   Patient Left in bed;with call bell/phone within reach;with bed alarm set;with nursing/sitter in room   Nurse Communication Mobility status        Time: 0131-4388 OT Time Calculation (min): 16 min  Charges: OT General Charges $OT Visit: 1 Procedure OT Treatments $Therapeutic Exercise: 8-22 mins Hulda Humphrey OTR/L [place] 10/03/2016, 4:31 PM

## 2016-10-03 NOTE — Clinical Social Work Note (Signed)
Clinical Social Work Assessment  Patient Details  Name: Erica Jimenez MRN: [id number] Date of Birth: [birth year]  Date of referral:  10/03/16               Reason for consult:  Facility Placement, Discharge Planning                Permission sought to share information with:  Facility Sport and exercise psychologist, Family Supports Permission granted to share information::  Yes, Verbal Permission Granted  Name::     Erica Jimenez  Agency::  SNF  Relationship::  Daughters  Contact Information:     Housing/Transportation Living arrangements for the past 2 months:  Single Family Home Source of Information:  Patient Patient Interpreter Needed:  None Criminal Activity/Legal Involvement Pertinent to Current Situation/Hospitalization:  No - Comment as needed Significant Relationships:  Adult Children Lives with:  Self, Adult Children Do you feel safe going back to the place where you live?  Yes Need for family participation in patient care:  No (Coment)  Care giving concerns:  Pt has increased limitations with mobility and is concerned about ability to independently care for self without physical rehab.   Social Worker assessment / plan:  CSW explained recommendation for SNF and discussed options with pt and pt's daughter. Pt and pt's daughter indicated preference for [facility]. CSW discussed with hospice SW, Dyann Kief, that the pt would need to revoke the hospice in order to go SNF, and CSW explained this to pt and pt's daughter. Pt indicated understanding, and that she would revoke her hospice in order to go SNF. CSW following to facilitate discharge when medically ready.  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  Patient/Family's Understanding of and Emotional  Response to Diagnosis, Current Treatment, and Prognosis:  Pt and pt's daughters seemed to understand pt's current functional limitations and benefits of physical rehab. Pt discussed how she didn't want to lose the abilities that she still had. Pt and pt's daughter indicated understanding of CSW role in discharge planning and appreciated the help.  Emotional Assessment Appearance:  Appears stated age Attitude/Demeanor/Rapport:    Affect (typically observed):  Appropriate Orientation:  Oriented to Situation, Oriented to  Time, Oriented to Self, Oriented to Place Alcohol / Substance use:  Not Applicable Psych involvement (Current and /or in the community):  No (Comment)  Discharge Needs  Concerns to be addressed:  Care Coordination, Discharge Planning Concerns Readmission within the last 30 days:  Yes Current discharge risk:  Physical Impairment Barriers to Discharge:  Continued Medical Work up   [employer], [place] 10/03/2016, 5:49 PM

## 2016-10-03 NOTE — Plan of Care (Signed)
Problem: Coping: Goal: Ability to identify appropriate support needs will improve Outcome: Progressing RN and patient discussed living arrangements PTA, hospice care/visits per week PTA and what she feels is needed regarding assistance with care and managing individual care. Patient states she feels she would benefit from PT/OT prior to returning home and may need more assistance than hospice nurse twice a week. RN offered reassurance and encouragement.  Problem: Self-Care: Goal: Ability to participate in self-care as condition permits will improve Outcome: Progressing Patient ambulating to and from bathroom with walker and stand-by assistance. Patient tolerated ambulation well with mild to moderate amount of SOB. Patient requires setup with meals, oral care and personal hygiene. Patient verbalizes the need for assistance due to lack of ROM in RUE. RN offered independence with ADL's with set-up and assistance with bathing and peri-care with toileting.  Problem: Nutrition: Goal: Risk of aspiration will decrease Outcome: Progressing Patient swallows with caution. Patient sits upright with good upper body control with intake. RN will continue to monitor. Goal: Dietary intake will improve Outcome: Progressing Patient drake approximately half container of Boost breeze and half container of Glucerna with no difficulty. Patient states Boost breeze seemed to give her gas and stomach cramping. Patient states Glucerna tends to make her bowels move and helps with her gas. Patient had 1 medium hard BM followed by 2 medium to large soft/loose stools after drinking Glucerna.

## 2016-10-03 NOTE — Progress Notes (Signed)
[facility] - GIP RN visit at 0720am.  This is a related and covered GIP admission of 10/01/16 with [facility] diagnosis of Lung Cancer per Dr. Orpah Melter.  Patient has an [place] DNR.  EMS activated after patient/daughter were aware of stroke like symptoms in the home and confirmed by HA that had visited the patient.   Visited with patient this morning.  Patient was sleepy,but able to answer questions.  Patient was oriented.  Patient states that she had been thinking about PT/rehab and would make a decision this morning when daughter came.  I advised that [facility] would also be following up with her on this.  She verbalized understanding.  Offered emotional support.   Patient currently receiving:  atorvastatin (LIPITOR) tablet 80 mg, Dose 80 mg, QD via PO; brinzolamide (AZOPT) 2% opthalmic suspension 1 drop, Dose 1 drop, BID via eyedrops; calcitRIOL (ROCALTROL) CAPSULE 0.25 mcg, Dose 0.25 mcg, Every M-W-F via PO; citalopram (CELEXA) tablet 20 mg, Dose 20 mg, QD after supper via PO; clopidogrel (PLAVIX) tablet 75 mg, Dose 75 mg, QD via PO; feeding supplement (BOOST/RESOURCE BREEZE) liquid 1 container, Dose 1 container, TID via PO; feeding supplement (ENSURE ENLIVE) liquid 237 mL, Dose 237 mL, BID via PO; folic acid (FOLVITE) tablet 1 mg, Dose 1 mg, QD via PO; insulin aspart (novoLOG) injection 0-5 Units, Dose 0-5 Units, QHS via [place]; insulin aspart (novoLOG) injection 0-9 Units, Dose 0-9 Units, TID with meals via [place]; insulin glargine (LANTUS) injection 15 units, Dose 15 units, QD before breakfast via [place]; latanoprost (XALATAN) 0.0025% opthalmic solution 1 drop, Dose 1 drop, QHS both eyes via eyedrops; levothyroxine (SYNTHROID, LEVOTHROID) tablet 52mcg, Dose 50 mcg, Daily before breakfast via PO; pantoprazole (PROTONIX) EC tablet 40 mg, Dose 40 mg, BID via PO.  Contiuous medications:  0.9% sodium chloride infusion, Rate 50 mL/hr, Continuous via IV.  No PRN medications  have been given thus far today.  Will continue to monitor patient while in the hospital and anticipate any discharge needs.  Thank you,  Edyth Gunnels, RN, BSN [facility] Liaison [phone]  All hospital liaisons are now on [place].

## 2016-10-03 NOTE — Progress Notes (Signed)
Hypoglycemic Event  CBG 48  Treatment: D50 IV 25 mL  Symptoms: Sweaty  Follow-up CBG: Time:1720 CBG Result:156  Possible Reasons for Event: Unknown  Comments/MD notified:Hypoglycemic protocol.    Tom-Johnson, Renea Ee

## 2016-10-03 NOTE — Evaluation (Signed)
SLP Cancellation Note  Patient Details Name: Erica Jimenez MRN: [id number] DOB: February 03, 1930   Cancelled treatment:       Reason Eval/Treat Not Completed: Other (comment) (pt sound asleep upon SLP attempts, will continue efforts)   Luanna Salk, [facility] SLP [phone]

## 2016-10-03 NOTE — Progress Notes (Signed)
STROKE TEAM PROGRESS NOTE   SUBJECTIVE (INTERVAL HISTORY)  Patient is sitting up which in a bedside chair. Her daughter is  at the bedside. She is awaiting rehab transfer   OBJECTIVE Temp:  [97.5 F (36.4 C)-99.1 F (37.3 C)] 97.5 F (36.4 C) (06/01 1602) Pulse Rate:  [53-118] 118 (06/01 1602) Cardiac Rhythm: Other (Comment) (06/01 0700) Resp:  [16-20] 20 (06/01 1602) BP: (147-180)/(62-98) 180/87 (06/01 1602) SpO2:  [94 %-100 %] 94 % (06/01 1602) Weight:  [153 lb 3.2 oz (69.5 kg)] 153 lb 3.2 oz (69.5 kg) (06/01 0936)  CBC:   Recent Labs Lab 10/01/16 1602 10/01/16 1610 10/03/16 0816  WBC 7.7  --  5.0  NEUTROABS 4.9  --   --   HGB 12.1 13.6 10.6*  HCT 38.6 40.0 34.2*  MCV 96.0  --  95.3  PLT 179  --  841    Basic Metabolic Panel:   Recent Labs Lab 10/01/16 1602 10/01/16 1610 10/03/16 0816  NA 137 139 139  K 5.0 4.9 3.9  CL 107 105 111  CO2 20*  --  21*  GLUCOSE 240* 231* 95  BUN 22* 25* 16  CREATININE 1.81* 1.70* 1.54*  CALCIUM 9.5  --  9.0    Lipid Panel:     Component Value Date/Time   CHOL 182 10/02/2016 0249   TRIG 90 10/02/2016 0249   HDL 53 10/02/2016 0249   CHOLHDL 3.4 10/02/2016 0249   VLDL 18 10/02/2016 0249   LDLCALC 111 (H) 10/02/2016 0249   HgbA1c:  Lab Results  Component Value Date   HGBA1C 7.6 (H) 10/02/2016    PHYSICAL EXAM Pleasant elderly African-American lady currently not in distress. . Afebrile. Head is nontraumatic. Neck is supple without bruit.    Cardiac exam no murmur or gallop. Lungs are clear to auscultation. Distal pulses are well felt. Neurological Exam :  Awake alert oriented 3 with normal speech and language function. No aphasia or apraxia dysarthria. Follows commands well. Extraocular moments are full range without nystagmus. Blinks to threat bilaterally. Fundi were not visualized. Vision acuity seems adequate. Face is symmetric without weakness. Tongue is midline. Motor system exam reveals mild right upper extremity  drift and weakness 4/5. Symmetric lower extremity strength. Normal left upper extremity strength. Right finger-to-nose dysmetria. Fine finger movements are diminished on the right. Orbits left over right upper extremity. Gait was not tested. Sensation is intact. Deep tendon reflexes are symmetric. Plantars are downgoing. ASSESSMENT/PLAN Ms. Erica Jimenez is a 83 y.o. female with history of lung cancer with home hospice, remote hx of bladder CA, diabetes mellitus, hypertension, chronic systolic heart failure, CKD, CVA, AF not on AC and recent admission for GI bleed and sepsis presenting with R sided weakness. She did not receive IV t-PA.   Stroke:  2 small L frontal infarcts embolic secondary to known atrial fibrillation    Code Stroke CT no acute stroke. B chronic ischemia. Aspects 10.    MRI  Small L frontal infarct. Tiny L temporal infarct. Old biparietal and L frontal lobe infarcts, old B occipital and small cerebellar infarcts.  MRA  No ELVO. B ICA atherosclerosis  Carotid Doppler  B ICA 1-39% stenosis, VAs antegrade  2D Echo  Left ventricle: The cavity size was normal. Wall thickness was   normal. Systolic function was moderately reduced. The estimated   ejection fraction was in the range of 35% to 40%. Anterior,   anteroseptal, apical and distal anteroapical severe hypokinesis  to akinesis -  LE doppler pending  LDL 111  HgbA1c 9.0  SCDs for VTE prophylaxis   Diet heart healthy/carb modified Room service appropriate? Yes; Fluid consistency: Thin  aspirin 325 mg daily and clopidogrel 75 mg daily prior to admission, now on   plavix  Therapy recommendations:  CIR  Disposition:  pending  (followed by [facility] PTA, lung cancer)  Atrial Fibrillation  Home anticoagulation:  none   Not an AC candidate given recent GIB  Hypertension  Stable Permissive hypertension (OK if < 220/120) but gradually normalize in 5-7 days Long-term BP goal normotensive  Hyperlipidemia  Home  meds:  lipitor 80, resumed in hospital  LDL 111  Continue statin at discharge  Diabetes  HgbA1c 9.0, goal < 7.0  Uncontrolled  Other Stroke Risk Factors  Advanced age  UDS positive for opiates and THC  ETOH level < 11  Hx stroke/TIA  08/2016 silent L parietal subcortical infarct felt to be due to hypercoagulability from cancer  Coronary artery disease  Chronic systolic HF  Aortic stenosis  PAD  Other Active Problems  Lung cancer, stage 4 non-small cell, sq cell w/p chemo March, now on home hospice  COPD  Elevated troponins  Stage 4 CKD  H/o acute blood loss anemia  Hospital day # 1  I have personally examined this patient, reviewed notes, independently viewed imaging studies, participated in medical decision making and plan of care.ROS completed by me personally and pertinent positives fully documented  I have made any additions or clarifications directly to the above note.  She has bilateral embolic left frontal infarct likely secondary to atrial fibrillation. She has had recent GI bleed and is not a long-term anticoagulation candidate.Continue Plavix for stroke prevention as she is not a long term anticoagulation candidate. Discussed with patient and daughter and answered questions.  Stroke team will sign off. Kindly call for questions. Antony Contras, MD Medical Director [facility] Pager: [phone] 10/03/2016 5:15 PM   To contact Stroke Continuity provider, please refer to [email or web address] After hours, contact General Neurology

## 2016-10-03 NOTE — Consult Note (Signed)
Physical Medicine and Rehabilitation Consult Reason for Consult: Right side weakness Referring Physician: [facility]   HPI: Erica Jimenez is a 83 y.o. right handed female with history of CAD, diabetes mellitus, CVA, COPD, CKD stage III, hypertension. Patient with history of right sided lung cancer followed by Dr. [name] and did receive chemotherapy 1 later aborted due to internal bleeding and patient has refused any further care and has been followed by hospice. Per chart review patient lives with daughter who works during the day. Ambulates with a rolling walker. Has a home health aide 2 times a week for ADLs. Presented 10/01/2016 with right sided weakness. MRI showed acute small left frontal lobe nonhemorrhagic infarct. Subcentimeter subacute left temporal lobe infarct. Old biparietal and left frontal lobe infarcts. MRA with no large vessel occlusion or stenosis. Patient did not receive TPA. Echocardiogram pending. Vascular studies lower extremity showed findings of DVT left tibioperoneal trunk and posterior tibial veins of the proximal calf. No evidence of DVT right lower extremity. Currently on Plavix and aspirin for CVA prophylaxis. Await planned for DVT. Therapy evaluations completed with recommendations of physical medicine rehabilitation consult.   Review of Systems  Constitutional: Negative for chills and fever.  Eyes: Negative for blurred vision and double vision.  Respiratory: Negative for cough and shortness of breath.   Cardiovascular: Positive for leg swelling. Negative for chest pain and palpitations.  Gastrointestinal: Positive for constipation. Negative for nausea and vomiting.  Genitourinary: Negative for dysuria and hematuria.  Musculoskeletal: Positive for joint pain and myalgias.  Skin: Negative for rash.  Neurological: Positive for weakness.  Psychiatric/Behavioral:       Anxiety  All other systems reviewed and are negative.  Past Medical History:  Diagnosis Date    . A-V fistula ([place])    pt has 2  fistulas in rt arm  . Alopecia   . Anemia   . Anxiety   . Aortic stenosis   . Arthritis    gout  . Bladder cancer [facility])     s/p resection and BCG treatment.   Marland Kitchen CAD (coronary artery disease)     Pt is s/p anterior MI in 1996 with stent placed in LAD.  Last myoview in our office was in 7/05 and  showed apical anterior, apical, and apical inferior infarct.  Minimal peri-infarct ischemia.  EF 49%.   . Cardiomyopathy, ischemic   . CKD (chronic kidney disease)     Pt is stage III-IV.  She had a fistula placed but is not on dialysis.  She follows with Dr. Lorrene Reid for  nephrology.   Marland Kitchen COPD (chronic obstructive pulmonary disease) ([place])   . CVA (cerebral vascular accident) ([place])   . DM (diabetes mellitus) ([place])   . Full dentures   . Gout   . History of glaucoma   . HOH (hard of hearing)    slightly  . HTN (hypertension)   . Hyperlipidemia   . Hypothyroidism   . Myocardial infarction ([place])   . PAD (peripheral artery disease) ([facility])     arterial doppler study 12/04 suggestive of > 50% bilateral SFA stenosis.  Pt has mild claudication.  No invasive evaluation given CKD and desire to avoid contrast use.   Marland Kitchen Shortness of breath dyspnea    on exertion  . Wears glasses    Past Surgical History:  Procedure Laterality Date  . ABDOMINAL AORTAGRAM N/A 08/17/2013   Procedure: ABDOMINAL Maxcine Ham;  Surgeon: Wellington Hampshire, MD;  Location: [facility] CATH  LAB;  Service: Cardiovascular;  Laterality: N/A;  . ANGIOPLASTY    . CYSTOSCOPY/RETROGRADE/URETEROSCOPY Bilateral 01/09/2015   Procedure: CYSTOSCOPY BILATERAL RETROGRADE PYELOGRAM, WASHINGS RENAL PELVIS & BLADDER ;  Surgeon: Lowella Bandy, MD;  Location: [facility];  Service: Urology;  Laterality: Bilateral;  . CYSTOSTOMY W/ BLADDER BIOPSY    . ESOPHAGOGASTRODUODENOSCOPY N/A 09/02/2016   Procedure: ESOPHAGOGASTRODUODENOSCOPY (EGD);  Surgeon: Jerene Bears, MD;  Location: [facility];  Service: Endoscopy;   Laterality: N/A;  . EYE SURGERY Bilateral    cataract w IOL   Family History  Problem Relation Age of Onset  . Breast cancer Mother   . Tuberculosis Father   . Heart disease Maternal Aunt    Social History:  reports that she quit smoking about 3 months ago. Her smoking use included Cigarettes. She has a 21.00 pack-year smoking history. She has never used smokeless tobacco. She reports that she drinks about 0.6 oz of alcohol per week . She reports that she does not use drugs. Allergies:  Allergies  Allergen Reactions  . Contrast Media [Iodinated Diagnostic Agents] Other (See Comments)    Renal insufficiency  . Fluoxetine Rash  . Sulfonamide Derivatives Nausea Only   Medications Prior to Admission  Medication Sig Dispense Refill  . aspirin 325 MG tablet Take 1 tablet (325 mg total) by mouth daily. 30 tablet 0  . atorvastatin (LIPITOR) 80 MG tablet Take 80 mg by mouth daily after supper.     . bisoprolol (ZEBETA) 5 MG tablet take 1/2 tablet by mouth once daily **PLEASE KEEP UPCOMING APPOINTMENT FOR FURTHER REFILLS** 15 tablet 0  . brinzolamide (AZOPT) 1 % ophthalmic suspension Place 1 drop into both eyes 2 (two) times daily.     . Carboxymethylcellulose Sodium (REFRESH TEARS OP) Place 1 drop into both eyes 3 (three) times daily as needed (dry eyes). Both eyes.    . citalopram (CELEXA) 10 MG tablet Take 2 tablets (20 mg total) by mouth daily after supper. 60 tablet 0  . clopidogrel (PLAVIX) 75 MG tablet Take 75 mg by mouth daily.    . febuxostat (ULORIC) 40 MG tablet Take 40 mg by mouth daily.    . feeding supplement (BOOST / RESOURCE BREEZE) LIQD Take 1 Container by mouth 3 (three) times daily between meals. 237 mL 12  . feeding supplement, ENSURE ENLIVE, (ENSURE ENLIVE) LIQD Take 237 mLs by mouth 2 (two) times daily between meals. 237 mL 12  . Ferrous Gluconate (FERGON PO) Take 1 tablet by mouth daily after supper.     . folic acid (FOLVITE) 1 MG tablet Take 1 mg by mouth daily.    .  furosemide (LASIX) 40 MG tablet Take 40 mg by mouth daily.     . hydrALAZINE (APRESOLINE) 50 MG tablet Take 1 tablet (50 mg total) by mouth 2 (two) times daily. 60 tablet 0  . insulin glargine (LANTUS) 100 unit/mL SOPN Inject 12 Units into the skin daily before breakfast.    . insulin lispro (HUMALOG KWIKPEN) 100 UNIT/ML KiwkPen Inject 2-3 Units into the skin 3 (three) times daily before meals.    . isosorbide mononitrate (IMDUR) 60 MG 24 hr tablet Take 60 mg by mouth daily after supper.     . latanoprost (XALATAN) 0.005 % ophthalmic solution Place 1 drop into both eyes at bedtime.     Marland Kitchen levothyroxine (SYNTHROID, LEVOTHROID) 50 MCG tablet Take 50 mcg by mouth daily after supper.     . lidocaine-prilocaine (EMLA) cream Apply 1 application  topically as needed. (Patient taking differently: Apply 1 application topically daily as needed (vaginal itching). ) 30 g 0  . LORazepam (ATIVAN) 0.5 MG tablet Take 0.5 mg by mouth every 4 (four) hours as needed for anxiety (short of breath).    . nitroGLYCERIN (NITROSTAT) 0.4 MG SL tablet Place 1 tablet (0.4 mg total) under the tongue every 5 (five) minutes as needed for chest pain. 25 tablet 3  . pantoprazole (PROTONIX) 40 MG tablet Take 1 tablet (40 mg total) by mouth 2 (two) times daily. 60 tablet 0  . potassium chloride SA (K-DUR,KLOR-CON) 20 MEQ tablet Take 20 mEq by mouth 2 (two) times daily.      Home: Home Living Family/patient expects to be discharged to:: Private residence Living Arrangements: Children Available Help at Discharge: Family, Available PRN/intermittently Type of Home: House Home Access: [place]: One level Bathroom Shower/Tub: Multimedia programmer: Standard Bathroom Accessibility: Yes Home Equipment: Environmental consultant - 2 wheels, [place] - single point, Bedside commode, Shower seat, Wheelchair - manual  Functional History: Prior Function Level of Independence: Needs assistance Gait / Transfers Assistance Needed:  ambulates with RW ADL's / Homemaking Assistance Needed: has an aide that comes 2x/week to assist pt with bathing, can dress and feed herself Functional Status:  Mobility: Bed Mobility Overal bed mobility: Needs Assistance Bed Mobility: Sit to Supine Supine to sit: Min guard Sit to supine: Min guard General bed mobility comments: close min guard for safety, increased time and effort especially with returning bilateral LEs onto bed Transfers Overall transfer level: Needs assistance Equipment used: Rolling walker (2 wheeled) Transfers: Sit to/from Stand Sit to Stand: Min guard General transfer comment: increased time, good technique, min guard for safety Ambulation/Gait Ambulation/Gait assistance: Min assist Ambulation Distance (Feet): 75 Feet Assistive device: Rolling walker (2 wheeled) Gait Pattern/deviations: Step-through pattern, Decreased step length - right, Decreased step length - left, Decreased stride length, Shuffle, Narrow base of support General Gait Details: modest instability but no overt LOB, constant min A for safety with use of RW and navigating around obstacles Gait velocity: decreased Gait velocity interpretation: Below normal speed for age/gender    ADL: ADL Overall ADL's : Needs assistance/impaired Eating/Feeding: Moderate assistance, Sitting Eating/Feeding Details (indicate cue type and reason): Pt using LUE to assist with RUE bringing utensils to mouth Grooming: Moderate assistance, Sitting, Wash/dry face Grooming Details (indicate cue type and reason): Pt able to complete with LUE, but Pt preferring to use RUE to "get it working right again" Upper Body Bathing: Sitting, Minimal assistance Lower Body Bathing: Minimal assistance Upper Body Dressing : Minimal assistance Lower Body Dressing: Minimal assistance Lower Body Dressing Details (indicate cue type and reason): able to cross legs sitting EOB (bring feet to knees) with no LOB Toilet Transfer: Minimal  assistance, Ambulation (1 HHA) Toileting- Clothing Manipulation and Hygiene: Minimal assistance Functional mobility during ADLs: Minimal assistance (1 HHA) General ADL Comments: Pt is very motivated to use RUE as much as possible, and attempts all ADL with RUE prior to using LUE  Cognition: Cognition Overall Cognitive Status: Within Functional Limits for tasks assessed Orientation Level: Oriented X4 Cognition Arousal/Alertness: Awake/alert Behavior During Therapy: WFL for tasks assessed/performed Overall Cognitive Status: Within Functional Limits for tasks assessed  Blood pressure (!) 147/72, pulse 70, temperature 99.1 F (37.3 C), temperature source Oral, resp. rate 20, height 5\' 4"  (1.626 m), weight 69.5 kg (153 lb 3.2 oz), SpO2 100 %. Physical Exam  Constitutional: She is oriented to person, place, and  time.  HENT:  Head: Normocephalic.  Eyes: EOM are normal.  Neck: Normal range of motion. Neck supple. No thyromegaly present.  Cardiovascular: Normal rate and regular rhythm.   Respiratory: Effort normal and breath sounds normal. No respiratory distress.  GI: Soft. Bowel sounds are normal. She exhibits no distension.  Neurological: She is alert and oriented to person, place, and time.  Phonation good, mild dysarthria. Mild right facial weakness however. Cognitively appears intact. RUE 3+ to 4-/5 with decreased LT. Limb is ataxic and [place] is diminished. RLE: 4/5 prox to distal. Sensory to LT/pain preserved   Skin: Skin is warm and dry.    Results for orders placed or performed during the hospital encounter of 10/01/16 (from the past 24 hour(s))  Glucose, capillary     Status: Abnormal   Collection Time: 10/02/16  5:09 PM  Result Value Ref Range   Glucose-Capillary 226 (H) 65 - 99 mg/dL   Comment 1 Notify RN    Comment 2 Document in Chart   Glucose, capillary     Status: Abnormal   Collection Time: 10/02/16  8:54 PM  Result Value Ref Range   Glucose-Capillary 165 (H) 65 - 99  mg/dL   Comment 1 Notify RN    Comment 2 Document in Chart   Glucose, capillary     Status: None   Collection Time: 10/03/16  6:13 AM  Result Value Ref Range   Glucose-Capillary 81 65 - 99 mg/dL  CBC     Status: Abnormal   Collection Time: 10/03/16  8:16 AM  Result Value Ref Range   WBC 5.0 4.0 - 10.5 K/uL   RBC 3.59 (L) 3.87 - 5.11 MIL/uL   Hemoglobin 10.6 (L) 12.0 - 15.0 g/dL   HCT 34.2 (L) 36.0 - 46.0 %   MCV 95.3 78.0 - 100.0 fL   MCH 29.5 26.0 - 34.0 pg   MCHC 31.0 30.0 - 36.0 g/dL   RDW 16.7 (H) 11.5 - 15.5 %   Platelets 180 150 - 400 K/uL  Comprehensive metabolic panel     Status: Abnormal   Collection Time: 10/03/16  8:16 AM  Result Value Ref Range   Sodium 139 135 - 145 mmol/L   Potassium 3.9 3.5 - 5.1 mmol/L   Chloride 111 101 - 111 mmol/L   CO2 21 (L) 22 - 32 mmol/L   Glucose, Bld 95 65 - 99 mg/dL   BUN 16 6 - 20 mg/dL   Creatinine, Ser 1.54 (H) 0.44 - 1.00 mg/dL   Calcium 9.0 8.9 - 10.3 mg/dL   Total Protein 5.7 (L) 6.5 - 8.1 g/dL   Albumin 2.7 (L) 3.5 - 5.0 g/dL   AST 21 15 - 41 U/L   ALT 11 (L) 14 - 54 U/L   Alkaline Phosphatase 78 38 - 126 U/L   Total Bilirubin 0.7 0.3 - 1.2 mg/dL   GFR calc non Af Amer 30 (L) >60 mL/min   GFR calc Af Amer 35 (L) >60 mL/min   Anion gap 7 5 - 15  Glucose, capillary     Status: Abnormal   Collection Time: 10/03/16 12:17 PM  Result Value Ref Range   Glucose-Capillary 187 (H) 65 - 99 mg/dL   Comment 1 Notify RN    Comment 2 Document in Chart    Dg Chest 2 View  Result Date: 10/01/2016 CLINICAL DATA:  Acute onset of right upper extremity weakness. Initial encounter. EXAM: CHEST  2 VIEW COMPARISON:  Chest radiograph from 09/04/2016  FINDINGS: The lungs are well-aerated. Minimal residual right basilar airspace opacity may reflect mild residual pneumonia. Mild scarring is noted at the right lung apex. There is no evidence of pleural effusion or pneumothorax. The heart is mildly enlarged. No acute osseous abnormalities are seen.  IMPRESSION: 1. Minimal residual right basilar airspace opacity may reflect mild residual pneumonia, improved from the prior study. 2. Mild scarring at the right lung apex. 3. Mild cardiomegaly. Electronically Signed   By: Garald Balding M.D.   On: 10/01/2016 22:34   [name] Contrast  Result Date: 10/01/2016 CLINICAL DATA:  RIGHT arm weakness and gait abnormality at noon today. History of hypertension, stroke, diabetes, lung cancer. EXAM: MRI HEAD WITHOUT CONTRAST MRA HEAD WITHOUT CONTRAST TECHNIQUE: Multiplanar, multiecho pulse sequences of the brain and surrounding structures were obtained without intravenous contrast. Angiographic images of the head were obtained using MRA technique without contrast. COMPARISON:  CT HEAD Oct 01, 2016 at 1612 hours and MRI head August 16, 2016 and MRA head August 17, 2016 FINDINGS: MRI HEAD FINDINGS BRAIN: Curvilinear reduced diffusion LEFT posterior frontal lobe precentral gyrus extending to centrum semiovale, with low ADC values. Residual subcentimeter reduced diffusion LEFT temporal lobe, persistent low ADC values. No susceptibility artifact to suggest hemorrhage. No midline shift, mass effect or masses. Old RIGHT occipital lobe, biparietal, LEFT frontal lobe encephalomalacia. Ex vacuo dilatation of subjacent ventricles. No hydrocephalus. Prominent LEFT greater than RIGHT thalami perivascular spaces associated with chronic hypertension. Old small bilateral cerebellar infarcts. No abnormal extra-axial fluid collections. VASCULAR: Normal major intracranial vascular flow voids present at skull base. SKULL AND UPPER CERVICAL SPINE: No abnormal sellar expansion. No suspicious calvarial bone marrow signal. Craniocervical junction maintained. SINUSES/ORBITS: Trace paranasal sinus mucosal thickening. The included ocular globes and orbital contents are non-suspicious. Status post bilateral ocular lens implants. OTHER: Patient is edentulous. MRA HEAD FINDINGS ANTERIOR CIRCULATION:  Flow related enhancement within bilateral internal carotid artery's; luminal irregularities of the carotid siphons with mild tandem stenosis due to calcific atherosclerosis as seen on today's head CT. RIGHT A1 segment is dominant, patent anterior communicating with normal flow related enhancement bilateral anterior cerebral artery's. Normal flow related enhancement bilateral middle cerebral artery's. No large vessel occlusion, high-grade stenosis, abnormal luminal irregularity, aneurysm. POSTERIOR CIRCULATION: LEFT vertebral artery is dominant. Basilar artery is patent, with normal flow related enhancement of the main branch vessels. Normal flow related enhancement of the posterior cerebral arteries. No large vessel occlusion, high-grade stenosis, abnormal luminal irregularity, aneurysm. ANATOMIC VARIANTS: None. Source images and MIP images were reviewed. IMPRESSION: MRI HEAD: Acute small LEFT frontal lobe nonhemorrhagic infarct. Subacute subcentimeter LEFT temporal lobe infarct though, considering ADC values, acute component is possible. Old biparietal and LEFT frontal lobe infarcts (MCA territories), old bioccipital lobe infarcts (PCA territories). Old small cerebellar infarcts. MRA HEAD: No emergent large vessel occlusion or severe stenosis. Atherosclerosis resulting in mild stenosis bilateral carotid siphons ICAs. Electronically Signed   By: Elon Alas M.D.   On: 10/01/2016 22:34   Mr Jodene Nam Head/brain XT Cm  Result Date: 10/01/2016 CLINICAL DATA:  RIGHT arm weakness and gait abnormality at noon today. History of hypertension, stroke, diabetes, lung cancer. EXAM: MRI HEAD WITHOUT CONTRAST MRA HEAD WITHOUT CONTRAST TECHNIQUE: Multiplanar, multiecho pulse sequences of the brain and surrounding structures were obtained without intravenous contrast. Angiographic images of the head were obtained using MRA technique without contrast. COMPARISON:  CT HEAD Oct 01, 2016 at 1612 hours and MRI head August 16, 2016  and MRA head August 17, 2016 FINDINGS: MRI HEAD FINDINGS BRAIN: Curvilinear reduced diffusion LEFT posterior frontal lobe precentral gyrus extending to centrum semiovale, with low ADC values. Residual subcentimeter reduced diffusion LEFT temporal lobe, persistent low ADC values. No susceptibility artifact to suggest hemorrhage. No midline shift, mass effect or masses. Old RIGHT occipital lobe, biparietal, LEFT frontal lobe encephalomalacia. Ex vacuo dilatation of subjacent ventricles. No hydrocephalus. Prominent LEFT greater than RIGHT thalami perivascular spaces associated with chronic hypertension. Old small bilateral cerebellar infarcts. No abnormal extra-axial fluid collections. VASCULAR: Normal major intracranial vascular flow voids present at skull base. SKULL AND UPPER CERVICAL SPINE: No abnormal sellar expansion. No suspicious calvarial bone marrow signal. Craniocervical junction maintained. SINUSES/ORBITS: Trace paranasal sinus mucosal thickening. The included ocular globes and orbital contents are non-suspicious. Status post bilateral ocular lens implants. OTHER: Patient is edentulous. MRA HEAD FINDINGS ANTERIOR CIRCULATION: Flow related enhancement within bilateral internal carotid artery's; luminal irregularities of the carotid siphons with mild tandem stenosis due to calcific atherosclerosis as seen on today's head CT. RIGHT A1 segment is dominant, patent anterior communicating with normal flow related enhancement bilateral anterior cerebral artery's. Normal flow related enhancement bilateral middle cerebral artery's. No large vessel occlusion, high-grade stenosis, abnormal luminal irregularity, aneurysm. POSTERIOR CIRCULATION: LEFT vertebral artery is dominant. Basilar artery is patent, with normal flow related enhancement of the main branch vessels. Normal flow related enhancement of the posterior cerebral arteries. No large vessel occlusion, high-grade stenosis, abnormal luminal irregularity,  aneurysm. ANATOMIC VARIANTS: None. Source images and MIP images were reviewed. IMPRESSION: MRI HEAD: Acute small LEFT frontal lobe nonhemorrhagic infarct. Subacute subcentimeter LEFT temporal lobe infarct though, considering ADC values, acute component is possible. Old biparietal and LEFT frontal lobe infarcts (MCA territories), old bioccipital lobe infarcts (PCA territories). Old small cerebellar infarcts. MRA HEAD: No emergent large vessel occlusion or severe stenosis. Atherosclerosis resulting in mild stenosis bilateral carotid siphons ICAs. Electronically Signed   By: Elon Alas M.D.   On: 10/01/2016 22:34   Ct Head Code Stroke W/o Cm  Result Date: 10/01/2016 CLINICAL DATA:  Code stroke. 83 year old female with right side weakness and abnormal gait. Last seen normal 1230 hours. EXAM: CT HEAD WITHOUT CONTRAST TECHNIQUE: Contiguous axial images were obtained from the base of the skull through the vertex without intravenous contrast. COMPARISON:  Head CT 05/11/2010. Brain MRI and intracranial MRA 08/16/2016 FINDINGS: Brain: Scattered chronic cortical encephalomalacia in the bilateral posterior MCA, anterior left MCA, and right PCA territories. Small chronic infarcts in the left cerebellar hemisphere. No cortically based acute infarct identified. No acute intracranial hemorrhage identified. No midline shift, mass effect, or evidence of intracranial mass lesion. Stable ventricle size and configuration. Vascular: Extensive Calcified atherosclerosis at the skull base. No suspicious intracranial vascular hyperdensity. Skull: No acute osseous abnormality identified. Hyperostosis of the calvarium, normal variant. Sinuses/Orbits: Visualized paranasal sinuses and mastoids are stable and well pneumatized. Other: No acute orbit or scalp soft tissue findings. ASPECTS [facility] Stroke Program Early CT Score) - Ganglionic level infarction (caudate, lentiform nuclei, internal capsule, insula, M1-M3 cortex): 7 -  Supraganglionic infarction (M4-M6 cortex): 3 Total score (0-10 with 10 being normal): 10 IMPRESSION: 1. Bilateral chronic ischemic disease appears stable since 08/16/2016. No acute cortically based infarct or acute intracranial hemorrhage identified. 2. ASPECTS is 10. 3. The above was relayed via text pager to E. Lindzen on 10/01/2016 at 16:19 . Electronically Signed   By: Genevie Ann M.D.   On: 10/01/2016 16:19    Assessment/Plan: Diagnosis: left frontal infarct with right hemiparesis and hemisensory  deficits 1. Does the need for close, 24 hr/day medical supervision in concert with the patient's rehab needs make it unreasonable for this patient to be served in a less intensive setting? Yes 2. Co-Morbidities requiring supervision/potential complications: DM2, CAD 3. Due to bladder management, bowel management, safety, skin/wound care, disease management, medication administration, pain management and patient education, does the patient require 24 hr/day rehab nursing? Yes 4. Does the patient require coordinated care of a physician, rehab nurse, PT (1-2 hrs/day, 5 days/week), OT (1-2 hrs/day, 5 days/week) and SLP (1-2 hrs/day, 5 days/week) to address physical and functional deficits in the context of the above medical diagnosis(es)? Yes Addressing deficits in the following areas: balance, endurance, locomotion, strength, transferring, bowel/bladder control, bathing, dressing, feeding, grooming, toileting, speech and psychosocial support 5. Can the patient actively participate in an intensive therapy program of at least 3 hrs of therapy per day at least 5 days per week? Yes 6. The potential for patient to make measurable gains while on inpatient rehab is excellent 7. Anticipated functional outcomes upon discharge from inpatient rehab are modified independent  with PT, modified independent with OT, modified independent with SLP. 8. Estimated rehab length of stay to reach the above functional goals is: 7  days 9. Anticipated D/C setting: Home 10. Anticipated post D/C treatments: HH therapy and Outpatient therapy 11. Overall Rehab/Functional Prognosis: excellent  RECOMMENDATIONS: This patient's condition is appropriate for continued rehabilitative care in the following setting: CIR Patient has agreed to participate in recommended program. Yes Note that insurance prior authorization may be required for reimbursement for recommended care.  Comment: Rehab Admissions Coordinator to follow up.  Thanks,  Meredith Staggers, MD, Mellody Drown    Cathlyn Parsons., PA-C 10/03/2016

## 2016-10-03 NOTE — Progress Notes (Signed)
Initial Nutrition Assessment  DOCUMENTATION CODES:   Non-severe (moderate) malnutrition in context of chronic illness  INTERVENTION:   -D/c Boost Breeze po TID, each supplement provides 250 kcal and 9 grams of protein -Increase Ensure Enlive po to TID, each supplement provides 350 kcal and 20 grams of protein  NUTRITION DIAGNOSIS:   Malnutrition (mild) related to chronic illness (lung cancer) as evidenced by energy intake < or equal to 75% for > or equal to 1 month, mild depletion of body fat, moderate depletion of body fat, moderate depletions of muscle mass, severe depletion of muscle mass.  GOAL:   Patient will meet greater than or equal to 90% of their needs  MONITOR:   PO intake, Supplement acceptance, Labs, TF tolerance, Skin, I & O's  REASON FOR ASSESSMENT:   Malnutrition Screening Tool    ASSESSMENT:   Erica Jimenez is a 83 y.o. female with medical history significant of  CAD, PVD, HYPERTENSION, CVA, DM, COPD, right side lunc cancer, follows with [name], presents to day to ED as code stroke as she reports right arm weakness and gait abnormality.   Pt admitted with sided weakness acute small lt frontal lobe nonhemorrhagic infarct.   Spoke with pt and daughter at bedside. Pt reports variable appetite; some days she will eat well (2 times per day- breakfast: cereal or eggs, lunch: sandwich or soup). Pt reports she has spells of eating well for a few days and then eating next to nothing for the next few days. She has been consuming Glucerna supplements at home (3 times per week), which she reserves for days that she does not eat well. She shares that she does not like the [employer] supplement, as it gives her gas. She has been drinking Glucerna instead of Ensure, due to having DM, however, pt denies any spikes in blood sugar when consuming Ensure.   Pt estimates she has lost 20# over the past month, but states "it's because they got all that fluid off my legs and feet".  Noted increase in wt over the past month. Wt has been stable for > 1 year.   Pt reports she consumed 1 pancake and some fruit for breakfast and consumed 100% of soup and crackers for lunch. Pt typically eats small meals as large portions of food tend to overwhelm her. Noted pt consumed 50% of Glucerna shake at bedside. She is interested in improving her nutritional status. Discussed difference between Ensure and Glucerna supplements. Pt with poor oral intake and would benefit from nutrient dense supplement. One Ensure Enlive supplement provides 350 kcals, 20 grams protein, and 44-45 grams of carbohydrate vs one Glucerna shake supplement, which provides 220 kcals, 10 grams of protein, and 26 grams of carbohydrate. Given pt's hx of DM, RD will continue to monitor PO intake, CBGS, and adjust supplement regimen as appropriate. Pt amenable to Ensure supplements.   Nutrition-Focused physical exam completed. Findings are mild to moderate fat depletion, moderate to severe muscle depletion, and no edema.   Labs reviewed: CBGS: 81-187.   Diet Order:  Diet heart healthy/carb modified Room service appropriate? Yes; Fluid consistency: Thin  Skin:  Reviewed, no issues  Last BM:  10/03/16  Height:   Ht Readings from Last 1 Encounters:  10/03/16 5\' 4"  (1.626 m)    Weight:   Wt Readings from Last 1 Encounters:  10/03/16 153 lb 3.2 oz (69.5 kg)    Ideal Body Weight:  54.5 kg  BMI:  Body mass index is  26.3 kg/m.  Estimated Nutritional Needs:   Kcal:  1500-1700  Protein:  70-85 grams  Fluid:  1.5-1.7 L  EDUCATION NEEDS:   Education needs addressed  Quetzaly Ebner A. Jimmye Norman, RD, LDN, CDE Pager: [phone] After hours Pager: [phone]

## 2016-10-03 NOTE — NC FL2 (Signed)
[place] LEVEL OF CARE SCREENING TOOL     IDENTIFICATION  Patient Name: Erica Jimenez Birthdate: 10-Jun-1929 Sex: female Admission Date (Current Location): 10/01/2016  [facility] and [place] Number:  Herbalist and Address:  [facility] [place], [place] [address], [place], [place] [address]      Provider Number: [id number]  Attending Physician Name and Address:  Thurnell Lose, MD  Relative Name and Phone Number:       Current Level of Care: Hospital Recommended Level of Care: [place] Prior Approval Number:    Date Approved/Denied:   PASRR Number:    Discharge Plan: SNF    Current Diagnoses: Patient Active Problem List   Diagnosis Date Noted  . Cerebral thrombosis with cerebral infarction 10/01/2016  . Right sided weakness 10/01/2016  . Bleeding acute gastric ulcer   . SOB (shortness of breath)   . Encounter for palliative care   . Malnutrition of moderate degree 09/02/2016  . Acute upper GI bleed   . Duodenal ulcer with hemorrhage   . Sepsis ([place]) 09/01/2016  . Goals of care, counseling/discussion 08/26/2016  . Acute cerebrovascular accident (CVA) ([place]) 08/16/2016  . Type 2 diabetes mellitus with hyperlipidemia ([place]) 08/16/2016  . CKD (chronic kidney disease), stage IV ([place]) 08/16/2016  . Anemia 08/16/2016  . Stage IV squamous cell carcinoma of right lung ([place]) 08/14/2016  . Lung cancer ([place]) 08/12/2016  . Right lower lobe lung mass 07/21/2016  . Smoking 08/30/2010  . Aortic valve disorder 02/12/2009  . CARDIOMYOPATHY, ISCHEMIC 08/14/2008  . Hyperlipidemia 07/11/2008  . CAD, NATIVE VESSEL 07/11/2008  . BRADYCARDIA 07/11/2008  . Peripheral vascular disease ([place]) 07/11/2008  . CARDIAC MURMUR 07/11/2008  . CAROTID BRUIT 07/11/2008    Orientation RESPIRATION BLADDER Height & Weight     Self, Time, Situation, Place  Normal Continent Weight: 153 lb 3.2 oz (69.5 kg) Height:  5\' 4"  (162.6 cm)  BEHAVIORAL  SYMPTOMS/MOOD NEUROLOGICAL BOWEL NUTRITION STATUS      Continent Diet (cardiac, carb modified)  AMBULATORY STATUS COMMUNICATION OF NEEDS Skin   Limited Assist Verbally Normal                       Personal Care Assistance Level of Assistance  Bathing, Dressing, Feeding Bathing Assistance: Limited assistance Feeding assistance: Limited assistance Dressing Assistance: Limited assistance     Functional Limitations Info             SPECIAL CARE FACTORS FREQUENCY  PT (By licensed PT), OT (By licensed OT)     PT Frequency: 5x/wk OT Frequency: 5x/wk            Contractures      Additional Factors Info  Code Status, Allergies, Psychotropic, Insulin Sliding Scale Code Status Info: DNR Allergies Info: Contrast Media Iodinated Diagnostic Agents, Fluoxetine, Sulfonamide Derivatives Psychotropic Info: Celexa 20mg  Insulin Sliding Scale Info: 3x/day       Current Medications (10/03/2016):  This is the current hospital active medication list Current Facility-Administered Medications  Medication Dose Route Frequency Provider Last Rate Last Dose  . acetaminophen (TYLENOL) tablet 650 mg  650 mg Oral Q4H PRN Hosie Poisson, MD      . atorvastatin (LIPITOR) tablet 80 mg  80 mg Oral QPC supper Hosie Poisson, MD   80 mg at 10/03/16 1704  . brinzolamide (AZOPT) 1 % ophthalmic suspension 1 drop  1 drop Both Eyes BID Hosie Poisson, MD   1 drop  at 10/03/16 0946  . calcitRIOL (ROCALTROL) capsule 0.25 mcg  0.25 mcg Oral Q M,W,F Hosie Poisson, MD   0.25 mcg at 10/03/16 0946  . citalopram (CELEXA) tablet 20 mg  20 mg Oral QPC supper Hosie Poisson, MD   20 mg at 10/03/16 1704  . clopidogrel (PLAVIX) tablet 75 mg  75 mg Oral Daily Garvin Fila, MD   75 mg at 10/03/16 0946  . feeding supplement (ENSURE ENLIVE) (ENSURE ENLIVE) liquid 237 mL  237 mL Oral TID BM Thurnell Lose, MD      . folic acid (FOLVITE) tablet 1 mg  1 mg Oral Daily Hosie Poisson, MD   1 mg at 10/03/16 0945  . furosemide  (LASIX) tablet 40 mg  40 mg Oral Daily Thurnell Lose, MD   40 mg at 10/03/16 1657  . insulin aspart (novoLOG) injection 0-5 Units  0-5 Units Subcutaneous QHS Hosie Poisson, MD   2 Units at 10/01/16 2309  . insulin aspart (novoLOG) injection 0-9 Units  0-9 Units Subcutaneous TID WC Hosie Poisson, MD   2 Units at 10/03/16 1240  . insulin glargine (LANTUS) injection 15 Units  15 Units Subcutaneous QAC breakfast Reyne Dumas, MD   15 Units at 10/03/16 1240  . isosorbide mononitrate (IMDUR) 24 hr tablet 60 mg  60 mg Oral QPC supper Thurnell Lose, MD   60 mg at 10/03/16 1705  . latanoprost (XALATAN) 0.005 % ophthalmic solution 1 drop  1 drop Both Eyes QHS Hosie Poisson, MD   1 drop at 10/02/16 2227  . levothyroxine (SYNTHROID, LEVOTHROID) tablet 50 mcg  50 mcg Oral QAC breakfast Hosie Poisson, MD   50 mcg at 10/03/16 0855  . nitroGLYCERIN (NITROSTAT) SL tablet 0.4 mg  0.4 mg Sublingual Q5 min PRN Hosie Poisson, MD      . pantoprazole (PROTONIX) EC tablet 40 mg  40 mg Oral BID Hosie Poisson, MD   40 mg at 10/03/16 0946  . senna-docusate (Senokot-S) tablet 1 tablet  1 tablet Oral QHS PRN Hosie Poisson, MD         Discharge Medications: Please see discharge summary for a list of discharge medications.  Relevant Imaging Results:  Relevant Lab Results:   Additional Information SS#: [id number]  Geralynn Ochs, LCSW

## 2016-10-03 NOTE — Progress Notes (Addendum)
*  PRELIMINARY RESULTS* Vascular Ultrasound Bilateral lower extremity venous duplex has been completed.  Preliminary findings: Findings suggest deep vein thrombosis in the left tibioperonal trunk and posterior tibial veins of the proximal calf. No evidence of deep vein thrombosis in the right lower extremity.  Incidentally noted: low level echoes in the right femoral artery, soft plaque vs thrombosis.  Preliminary results called to nurse, Darnelle Bos.  Everrett Coombe 10/03/2016, 12:16 PM

## 2016-10-03 NOTE — Progress Notes (Signed)
Physical Therapy Treatment Patient Details Name: Erica Jimenez MRN: [id number] DOB: 05-05-1930 Today's Date: 10/03/2016    History of Present Illness Pt is an 83 y/o female admitted secondary to R UE weakness and gait abnormality. MRI revealed an acute small L frontal lobe nonhemorrhagic infarct. PMH including but not limited to bladder cancer s/p resection and BCG treatment, R sided lung cancer, CAD, CKD, HTN, DM, COPD, PAD and hx of MI.    PT Comments    Pt seen for mobility progression. Pt tolerated increased ambulation distance and able to use RW this session, maintaining grip with R hand independently. Pt continues to have modest instability with ambulation and is at an increased risk for falls. Pt very pleasant and motivated to work with therapist. Pt would continue to benefit from skilled physical therapy services at this time while admitted and after d/c to address the below listed limitations in order to improve overall safety and independence with functional mobility.    Follow Up Recommendations  CIR     Equipment Recommendations  None recommended by PT    Recommendations for Other Services Rehab consult     Precautions / Restrictions Precautions Precautions: Fall Restrictions Weight Bearing Restrictions: No    Mobility  Bed Mobility Overal bed mobility: Needs Assistance Bed Mobility: Sit to Supine       Sit to supine: Min guard   General bed mobility comments: close min guard for safety, increased time and effort especially with returning bilateral LEs onto bed  Transfers Overall transfer level: Needs assistance Equipment used: Rolling walker (2 wheeled) Transfers: Sit to/from Stand Sit to Stand: Min guard         General transfer comment: increased time, good technique, min guard for safety  Ambulation/Gait Ambulation/Gait assistance: Min assist Ambulation Distance (Feet): 75 Feet Assistive device: Rolling walker (2 wheeled) Gait Pattern/deviations:  Step-through pattern;Decreased step length - right;Decreased step length - left;Decreased stride length;Shuffle;Narrow base of support Gait velocity: decreased Gait velocity interpretation: Below normal speed for age/gender General Gait Details: modest instability but no overt LOB, constant min A for safety with use of RW and navigating around obstacles   Stairs            Wheelchair Mobility    Modified Rankin (Stroke Patients Only) Modified Rankin (Stroke Patients Only) Pre-Morbid Rankin Score: Moderate disability Modified Rankin: Moderately severe disability     Balance Overall balance assessment: Needs assistance Sitting-balance support: Feet supported Sitting balance-Leahy Scale: Good     Standing balance support: During functional activity;No upper extremity supported Standing balance-Leahy Scale: Fair                              Cognition Arousal/Alertness: Awake/alert Behavior During Therapy: WFL for tasks assessed/performed Overall Cognitive Status: Within Functional Limits for tasks assessed                                        Exercises      General Comments        Pertinent Vitals/Pain Pain Assessment: No/denies pain    Home Living                      Prior Function            PT Goals (current goals can now be found in the care  plan section) Acute Rehab PT Goals PT Goal Formulation: With patient Time For Goal Achievement: 10/16/16 Potential to Achieve Goals: Good Progress towards PT goals: Progressing toward goals    Frequency    Min 4X/week      PT Plan Current plan remains appropriate    Co-evaluation              AM-PAC PT "6 Clicks" Daily Activity  Outcome Measure  Difficulty turning over in bed (including adjusting bedclothes, sheets and blankets)?: A Little Difficulty moving from lying on back to sitting on the side of the bed? : A Little Difficulty sitting down on and  standing up from a chair with arms (e.g., wheelchair, bedside commode, etc,.)?: Total Help needed moving to and from a bed to chair (including a wheelchair)?: A Little Help needed walking in hospital room?: A Little Help needed climbing 3-5 steps with a railing? : A Lot 6 Click Score: 15    End of Session Equipment Utilized During Treatment: Gait belt Activity Tolerance: Patient tolerated treatment well Patient left: in bed;with call bell/phone within reach;with bed alarm set Nurse Communication: Mobility status PT Visit Diagnosis: Other abnormalities of gait and mobility (R26.89);Other symptoms and signs involving the nervous system (E95.284)     Time: 1324-4010 PT Time Calculation (min) (ACUTE ONLY): 21 min  Charges:  $Gait Training: 8-22 mins                    G Codes:       [place], [place], [place] 10/03/2016, 9:36 AM

## 2016-10-04 DIAGNOSIS — R531 Weakness: Secondary | ICD-10-CM

## 2016-10-04 LAB — GLUCOSE, CAPILLARY
GLUCOSE-CAPILLARY: 85 mg/dL (ref 65–99)
GLUCOSE-CAPILLARY: 135 mg/dL — AB (ref 65–99)
GLUCOSE-CAPILLARY: 124 mg/dL — AB (ref 65–99)
Glucose-Capillary: 188 mg/dL — ABNORMAL HIGH (ref 65–99)
Glucose-Capillary: 204 mg/dL — ABNORMAL HIGH (ref 65–99)

## 2016-10-04 MED ORDER — INSULIN GLARGINE 100 UNIT/ML ~~LOC~~ SOLN
11.0000 [IU] | Freq: Every day | SUBCUTANEOUS | Status: DC
  Administered 2016-10-04 – 2016-10-05 (×2): 11 [IU] via SUBCUTANEOUS
  Filled 2016-10-04 (×3): qty 0.11

## 2016-10-04 MED ORDER — INSULIN GLARGINE 100 UNIT/ML ~~LOC~~ SOLN: 11.0000 [IU] | Freq: Every day | SUBCUTANEOUS | Status: DC

## 2016-10-04 NOTE — Progress Notes (Addendum)
[facility] Liaison:  RN  I did leave a message for On-call Social Worker, Abagail Kitchens, today to contact me back regarding this patient and the need for revocation before discharge.  Per Ottis Stain, patient will not go to SNF with Rehab until tomorrow.  Will continue to follow patient.    Thank you,  Edyth Gunnels, RN, [facility] Liaison [phone]   **UPDATE**  M. Pridgen to try and get in touch with family and see patient today to get paperwork taken care of.  She will update hospital liaison team when complete. 10/04/16 1238pm

## 2016-10-04 NOTE — Progress Notes (Signed)
Patient had 5 beat run of vtach. Patient asymptomatic. Will continue to monitor

## 2016-10-04 NOTE — Progress Notes (Signed)
Clinical Social Worker spoke with administrative personal Nicole Kindred) from [facility]. Nicole Kindred stated they are able to accept patient and is aware of pending discharge. CSW has reach out to [facility]) to make her aware of patients discharge plans.  Rhea Pink, MSW,  [place]

## 2016-10-04 NOTE — Progress Notes (Addendum)
[facility] - GIP RN visit at 0830am.  This is a related and covered GIP admission of 10/01/16 with [facility] diagnosis of Lung Cancer per Dr. Orpah Melter. Patient has an [place] DNR. EMS activated after patient/daughter were aware of stroke like symptoms in the home and confirmed by [facility] that had visited the patient.  Visited with patient this morning. Patient was sitting up in a chair, eating breakfast.  She advised she is still having trouble with her R hand weakness and was unable to eat with her R hand - but she was able to pick up the fork without difficulty.  Patient alert and oriented this morning.  Patient in NAD.   Patient states "I will be going to rehab", and states "i have to fill out some paperwork to go".  Seems up to date on information that has been given to her.   Spoke with [place], LCSW, this morning and she referred me to [place], [place].  Left message for Caryl Pina to contact me back to go over when patient is expected to transition to SNF with rehab.  I will need to get our on-call Social Worker to come go over revocation paperwork with the patient before that transfer.   Patient currently receiving: atorvastatin (LIPITOR) tablet 80 mg, Dose 80 mg, QD via PO; brinzolamide (AZOPT) 2% opthalmic suspension 1 drop, Dose 1 drop, BID via eyedrops; calcitRIOL (ROCALTROL) CAPSULE 0.25 mcg, Dose 0.25 mcg, Every M-W-F via PO; citalopram (CELEXA) tablet 20 mg, Dose 20 mg, QD after supper via PO; clopidogrel (PLAVIX) tablet 75 mg, Dose 75 mg, QD via PO; feeding supplement (ENSURE ENLIVE) liquid 237 mL, Dose 237 mL, BID via PO; folic acid (FOLVITE) tablet 1 mg, Dose 1 mg, QD via PO; furosemide (LASIX) tablet 40 mg, Dose 40 mg, QD via PO; insulin aspart (novoLOG) injection 0-5 Units, Dose 0-5 Units, QHS via [place]; insulin aspart (novoLOG) injection 0-9 Units, Dose 0-9 Units, TID with meals via [place]; insulin glargine (LANTUS) injection 15 units, Dose 15  units, QD before breakfast via [place]; isosorbide mononitrate (IMDUR) 24 hr table 60 mg, Dose 60 mg, QD after supper via PO; latanoprost (XALATAN) 0.0025% opthalmic solution 1 drop, Dose 1 drop, QHS both eyes via eyedrops; levothyroxine (SYNTHROID, LEVOTHROID) tablet 6mcg, Dose 50 mcg, Daily before breakfast via PO; pantoprazole (PROTONIX) EC tablet 40 mg, Dose 40 mg, BID via PO. Contiuous medications: none at this time. No PRN medications have been given thus far today.  Will continue to monitor patient while in the hospital and anticipate any discharge needs.  Thank you,  Edyth Gunnels, RN, [facility] Liaison [phone]

## 2016-10-04 NOTE — Evaluation (Signed)
Speech Language Pathology Evaluation Patient Details Name: Erica Jimenez MRN: [id number] DOB: 1930-02-12 Today's Date: 10/04/2016 Time: 3500-9381 SLP Time Calculation (min) (ACUTE ONLY): 25 min  Problem List:  Patient Active Problem List   Diagnosis Date Noted  . Cerebral thrombosis with cerebral infarction 10/01/2016  . Right sided weakness 10/01/2016  . Bleeding acute gastric ulcer   . SOB (shortness of breath)   . Encounter for palliative care   . Malnutrition of moderate degree 09/02/2016  . Acute upper GI bleed   . Duodenal ulcer with hemorrhage   . Sepsis ([place]) 09/01/2016  . Goals of care, counseling/discussion 08/26/2016  . Acute cerebrovascular accident (CVA) ([place]) 08/16/2016  . Type 2 diabetes mellitus with hyperlipidemia ([place]) 08/16/2016  . CKD (chronic kidney disease), stage IV ([place]) 08/16/2016  . Anemia 08/16/2016  . Stage IV squamous cell carcinoma of right lung ([place]) 08/14/2016  . Lung cancer ([place]) 08/12/2016  . Right lower lobe lung mass 07/21/2016  . Smoking 08/30/2010  . Aortic valve disorder 02/12/2009  . CARDIOMYOPATHY, ISCHEMIC 08/14/2008  . Hyperlipidemia 07/11/2008  . CAD, NATIVE VESSEL 07/11/2008  . BRADYCARDIA 07/11/2008  . Peripheral vascular disease ([place]) 07/11/2008  . CARDIAC MURMUR 07/11/2008  . CAROTID BRUIT 07/11/2008   Past Medical History:  Past Medical History:  Diagnosis Date  . A-V fistula ([place])    pt has 2  fistulas in rt arm  . Alopecia   . Anemia   . Anxiety   . Aortic stenosis   . Arthritis    gout  . Bladder cancer [facility])     s/p resection and BCG treatment.   [place] Kitchen CAD (coronary artery disease)     Pt is s/p anterior MI in 1996 with stent placed in LAD.  Last myoview in our office was in 7/05 and  showed apical anterior, apical, and apical inferior infarct.  Minimal peri-infarct ischemia.  EF 49%.   . Cardiomyopathy, ischemic   . CKD (chronic kidney disease)     Pt is stage III-IV.  She had a fistula placed but is not on  dialysis.  She follows with Dr. Lorrene Reid for  nephrology.   Marland Kitchen COPD (chronic obstructive pulmonary disease) ([place])   . CVA (cerebral vascular accident) ([place])   . DM (diabetes mellitus) ([place])   . Full dentures   . Gout   . History of glaucoma   . HOH (hard of hearing)    slightly  . HTN (hypertension)   . Hyperlipidemia   . Hypothyroidism   . Myocardial infarction ([place])   . PAD (peripheral artery disease) ([facility])     arterial doppler study 12/04 suggestive of > 50% bilateral SFA stenosis.  Pt has mild claudication.  No invasive evaluation given CKD and desire to avoid contrast use.   [place] Kitchen Shortness of breath dyspnea    on exertion  . Wears glasses    Past Surgical History:  Past Surgical History:  Procedure Laterality Date  . ABDOMINAL AORTAGRAM N/A 08/17/2013   Procedure: ABDOMINAL Maxcine Ham;  Surgeon: Wellington Hampshire, MD;  Location: [facility] CATH LAB;  Service: Cardiovascular;  Laterality: N/A;  . ANGIOPLASTY    . CYSTOSCOPY/RETROGRADE/URETEROSCOPY Bilateral 01/09/2015   Procedure: CYSTOSCOPY BILATERAL RETROGRADE PYELOGRAM, WASHINGS RENAL PELVIS & BLADDER ;  Surgeon: Lowella Bandy, MD;  [facility];  Service: Urology;  Laterality: Bilateral;  . CYSTOSTOMY W/ BLADDER BIOPSY    . ESOPHAGOGASTRODUODENOSCOPY N/A 09/02/2016   Procedure: ESOPHAGOGASTRODUODENOSCOPY (EGD);  Surgeon: Jerene Bears, MD;  Location:  [facility] ENDOSCOPY;  Service: Endoscopy;  Laterality: N/A;  . EYE SURGERY Bilateral    cataract w IOL   HPI:  83yo female admitted 10/01/16 due to right side weakness. PMH significant for CAD, PVD, HTN, CVA, DM, COPD, lung cancer, bladder cancer, CKD, MI, PAD. MRI revealed small left frontal lobe infarct.   Assessment / Plan / Recommendation Clinical Impression  Patient presents with a very mild cognitive impairment, however per patient's report, her memory had been declining slightly, but she feels that it is nothing more than would be expected at her age. Patient was able to  demonstrate appropriate reasoning, safety awareness, and awareness to her current level of function and needs. She is very motivated to have rehab so she can improve and stated that she would like to be at a facility rather than home health because it would be easier for her daughter, and she feels that she would get more intense therapy at a facility. Patient did not exhibit any dysarthria and receptive and expressive language were all within functional limits.     SLP Assessment  SLP Recommendation/Assessment: All further Speech Lanaguage Pathology  needs can be addressed in the next venue of care SLP Visit Diagnosis: Cognitive communication deficit (R41.841)    Follow Up Recommendations  Skilled Nursing facility;Home health SLP (She could benefit from assessment for medication management, etc. )    Frequency and Duration           SLP Evaluation Cognition  Overall Cognitive Status: Difficult to assess Arousal/Alertness: Awake/alert Orientation Level: Oriented X4 Attention: Selective Selective Attention: Appears intact Memory: Impaired Memory Impairment: Retrieval deficit;Decreased short term memory (patient reports that her memory has declined some but she feels it is normal for her age and does not attribute this to current hospitalization) Decreased Short Term Memory: Verbal complex Awareness: Appears intact Problem Solving: Appears intact Safety/Judgment: Appears intact Comments: Patient stated that she manages her medication at home and has a pill box, but that "I was slack" about keeping up with her insulin. She told clinician that her main difficulties at this time are difficulty with some parts of dressing, and that after a BM, she can't clean herself properly       Comprehension  Auditory Comprehension Overall Auditory Comprehension: Appears within functional limits for tasks assessed    Expression Expression Primary Mode of Expression: Verbal Verbal Expression Overall  Verbal Expression: Appears within functional limits for tasks assessed   Oral / Motor  Oral Motor/Sensory Function Overall Oral Motor/Sensory Function: Within functional limits Motor Speech Overall Motor Speech: Appears within functional limits for tasks assessed   Anthony, MA, CCC-SLP 10/04/16 2:45 PM

## 2016-10-04 NOTE — Progress Notes (Signed)
@IPLOG         PROGRESS NOTE                                                                                                                                                                                                             Patient Demographics:    Erica Jimenez, is a 83 y.o. female, DOB - 1929-09-07, OBS:[id number]  Admit date - 10/01/2016   Admitting Physician Hosie Poisson, MD  Outpatient Primary MD for the patient is [name] (Inactive)  LOS - 2  Chief Complaint  Patient presents with  . Code Stroke       Brief Narrative   Erica Jimenez is a 83 y.o. female with medical history significant of  CAD, PVD, HYPERTENSION, CVA, DM, COPD, right side lunc cancer, follows with [name], presents to day to ED as code stroke as she reports right arm weakness and gait abnormality. She noticed the right arm weakness around 12 pm.. She is currently on hospice but wanted to be treated for all treatable conditions. tPA was not given as risk is greater than benefit and pt did not want to go ahead with the tPA. She also reports 3 to 4 bowel movements earlier today. She denies any other complaints.     Subjective:    Erica Jimenez today has, No headache, No chest pain, No abdominal pain - No Nausea, No new weakness tingling or numbness, No Cough - SOB. R arm weakness.    Assessment  & Plan :     1.R.sided weakness Acute small LEFT frontal lobe nonhemorrhagic infarct. Subacute subcentimeter LEFT temporal lobe infarct though - has H/O Afib not on Anticoagulation due to risk of GI bleeding and falls. She actually had a major GI bleed 1 month ago requiring 4 units of packed RBC transfusion. She is also lung cancer patient who was on home hospice.  Seen by neurology currently on Plavix and statin along with PPI twice a day, risks and benefits of stroke versus GI bleeding discussed in detail with patient and her daughter, seen by stroke team, deficits largely resolved with minimal left arm  weakness, will need SNF. CIR saw the patient but she did not qualify.  2. Stage III non-small cell lung cancer. Under the care of Dr. [name], patient has decided to stop all chemotherapy several weeks ago and was under the care of home hospice which will be continued once she reaches home.  3. Recent history of upper GI bleed. Stable on twice a day PPI will  continue to monitor.  4. Paroxysmal atrial fibrillation [place] vasc 2 score of at least 6. Not on rate controlling agents, not on anticoagulation due to fall risk and GI platelet risk, continue Plavix for now.  5. Dyslipidemia. Statin maximized. LDL was above goal.  6. Hypothyroidism - on Synthroid continue   7. Chronic systolic and diastolic CHF EF 54-09%. Currently compensated home dose Lasix and Imdur resumed, will monitor. Not on ACE/ARB due to renal insufficiency.   8. CKD 3. Baseline creatinine around 1.8 to 1.9. At baseline  9. Non-ACS pattern flat rise in troponin. No chest pain, EKG nonacute, echocardiogram shows depressed EF and wall motion abnormality, with her lung cancer, hospice status, recent GI bleed she is not a candidate for invasive procedures or interventions, continue Plavix and statin.  10. Left lower extremity below knee DVT. Plan discussed with vascular surgeon Dr. [name], patient, patient's daughter. Risks and benefits explained. In the light of her recent GI bleed, poor functional status, lung cancer and hospice status. Best approach will be to repeat ultrasound in 3-4 weeks, if the clot is not propagating [name], if awaiting IVC filter.   11. DM type II. On Lantus and sliding scale. Lantus dose dropped on 10/04/2016 will monitor.  Lab Results  Component Value Date   HGBA1C 7.6 (H) 10/02/2016   CBG (last 3)   Recent Labs  10/03/16 1720 10/03/16 2104 10/04/16 0605  GLUCAP 156* 256* 85      Diet : Diet heart healthy/carb modified Room service appropriate? Yes; Fluid consistency: Thin    Family  Communication  :  Daughter x 4 over the phone on 10-03-16 (she was extremely rude throughout, she was rude to the staff last night per Agricultural consultant and Special educational needs teacher)  Code Status :  DNR  Disposition Plan  : SNF  Consults  :  Neuro  Procedures  :   Vascular Ultrasound  Bilateral lower extremity venous duplex has been completed.  Preliminary findings: Findings suggest deep vein thrombosis in the left tibioperonal trunk and posterior tibial veins of the proximal calf. No evidence of deep vein thrombosis in the right lower extremity.  Incidentally noted: low level echoes in the right femoral artery, soft plaque vs thrombosis.  TTE   - Left ventricle: The cavity size was normal. Wall thickness was normal. Systolic function was moderately reduced. The estimated ejection fraction was in the range of 35% to 40%. Anterior, anteroseptal, apical and distal anteroapical severe hypokinesis to akinesis - suggestive of LAD territroy ischemia/infarct. Doppler parameters are consistent with abnormal left ventricular relaxation (grade 1 diastolic dysfunction). The E/e&' ratio is >15, suggesting elevated LV filling pressure. - Aortic valve: Trileaflet. Sclerosis without stenosis. There was trivial regurgitation. - Mitral valve: Mildly thickened leaflets . There was mild regurgitation. - Left atrium: The atrium was mildly dilated. - Inferior vena cava: The vessel was normal in size. The respirophasic diameter changes were in the normal range (= 50%), consistent with normal central venous pressure.  Impressions:   Compared to a prior study in 08/2016, the LVEF is lower at 35-40% with persistent LAD territory wall motion abnormality. There is coarse apical trabeculation, but no obvious mural thrombus.   DVT Prophylaxis  :   SCDs    Lab Results  Component Value Date   PLT 180 10/03/2016    Inpatient Medications  Scheduled Meds: . atorvastatin  80 mg Oral QPC supper  . brinzolamide  1 drop Both Eyes BID  .  calcitRIOL  0.25  mcg Oral Q M,W,F  . citalopram  20 mg Oral QPC supper  . clopidogrel  75 mg Oral Daily  . feeding supplement (ENSURE ENLIVE)  237 mL Oral TID BM  . folic acid  1 mg Oral Daily  . furosemide  40 mg Oral Daily  . insulin aspart  0-5 Units Subcutaneous QHS  . insulin aspart  0-9 Units Subcutaneous TID WC  . insulin glargine  15 Units Subcutaneous QAC breakfast  . isosorbide mononitrate  60 mg Oral QPC supper  . latanoprost  1 drop Both Eyes QHS  . levothyroxine  50 mcg Oral QAC breakfast  . pantoprazole  40 mg Oral BID   Continuous Infusions: PRN Meds:.acetaminophen **OR** [DISCONTINUED] acetaminophen (TYLENOL) oral liquid 160 mg/5 mL **OR** [DISCONTINUED] acetaminophen, nitroGLYCERIN, senna-docusate  Antibiotics  :    Anti-infectives    None         Objective:   Vitals:   10/03/16 1722 10/03/16 2113 10/04/16 0122 10/04/16 0542  BP: (!) 159/75 (!) 158/67 (!) 159/86 (!) 165/68  Pulse: 78 85 (!) 103 78  Resp:  20 20 20   Temp: 97.8 F (36.6 C) 98.6 F (37 C) 98.4 F (36.9 C) 98.7 F (37.1 C)  TempSrc: Oral Oral Oral Oral  SpO2: 100% 98% 98% 100%  Weight:      Height:        Wt Readings from Last 3 Encounters:  10/03/16 69.5 kg (153 lb 3.2 oz)  09/08/16 70.4 kg (155 lb 3.2 oz)  08/26/16 63.5 kg (140 lb 1.6 oz)     Intake/Output Summary (Last 24 hours) at 10/04/16 0946 Last data filed at 10/04/16 0542  Gross per 24 hour  Intake              840 ml  Output                0 ml  Net              840 ml     Physical Exam  Awake Alert, Oriented X 3, No new F.N deficits, Minimal weakness in the right arm, Normal affect [place].AT,PERRAL Supple Neck,No JVD, No cervical lymphadenopathy appriciated.  Symmetrical Chest wall movement, Good air movement bilaterally, CTAB RRR,No Gallops,Rubs or new Murmurs, No Parasternal Heave +ve B.Sounds, Abd Soft, No tenderness, No organomegaly appriciated, No rebound - guarding or rigidity. No Cyanosis, Clubbing or  edema, No new Rash or bruise      Data Review:    CBC  Recent Labs Lab 10/01/16 1602 10/01/16 1610 10/03/16 0816  WBC 7.7  --  5.0  HGB 12.1 13.6 10.6*  HCT 38.6 40.0 34.2*  PLT 179  --  180  MCV 96.0  --  95.3  MCH 30.1  --  29.5  MCHC 31.3  --  31.0  RDW 16.5*  --  16.7*  LYMPHSABS 1.7  --   --   MONOABS 0.8  --   --   EOSABS 0.2  --   --   BASOSABS 0.0  --   --     Chemistries   Recent Labs Lab 10/01/16 1602 10/01/16 1610 10/03/16 0816  NA 137 139 139  K 5.0 4.9 3.9  CL 107 105 111  CO2 20*  --  21*  GLUCOSE 240* 231* 95  BUN 22* 25* 16  CREATININE 1.81* 1.70* 1.54*  CALCIUM 9.5  --  9.0  AST 25  --  21  ALT 14  --  11*  ALKPHOS 94  --  78  BILITOT 0.7  --  0.7   ------------------------------------------------------------------------------------------------------------------  Recent Labs  10/02/16 0249  CHOL 182  HDL 53  LDLCALC 111*  TRIG 90  CHOLHDL 3.4    Lab Results  Component Value Date   HGBA1C 7.6 (H) 10/02/2016   ------------------------------------------------------------------------------------------------------------------ No results for input(s): TSH, T4TOTAL, T3FREE, THYROIDAB in the last 72 hours.  Invalid input(s): FREET3 ------------------------------------------------------------------------------------------------------------------ No results for input(s): VITAMINB12, FOLATE, FERRITIN, TIBC, IRON, RETICCTPCT in the last 72 hours.  Coagulation profile  Recent Labs Lab 10/01/16 1602  INR 1.00    No results for input(s): DDIMER in the last 72 hours.  Cardiac Enzymes  Recent Labs Lab 10/01/16 2235 10/02/16 0249 10/02/16 0746  TROPONINI 0.25* 0.26* 0.26*   ------------------------------------------------------------------------------------------------------------------ No results found for: BNP  Micro Results No results found for this or any previous visit (from the past 240 hour(s)).  Radiology  Reports Dg Chest 2 View  Result Date: 10/01/2016 CLINICAL DATA:  Acute onset of right upper extremity weakness. Initial encounter. EXAM: CHEST  2 VIEW COMPARISON:  Chest radiograph from 09/04/2016 FINDINGS: The lungs are well-aerated. Minimal residual right basilar airspace opacity may reflect mild residual pneumonia. Mild scarring is noted at the right lung apex. There is no evidence of pleural effusion or pneumothorax. The heart is mildly enlarged. No acute osseous abnormalities are seen. IMPRESSION: 1. Minimal residual right basilar airspace opacity may reflect mild residual pneumonia, improved from the prior study. 2. Mild scarring at the right lung apex. 3. Mild cardiomegaly. Electronically Signed   By: Garald Balding M.D.   On: 10/01/2016 22:34   [name] Contrast  Result Date: 10/01/2016 CLINICAL DATA:  RIGHT arm weakness and gait abnormality at noon today. History of hypertension, stroke, diabetes, lung cancer. EXAM: MRI HEAD WITHOUT CONTRAST MRA HEAD WITHOUT CONTRAST TECHNIQUE: Multiplanar, multiecho pulse sequences of the brain and surrounding structures were obtained without intravenous contrast. Angiographic images of the head were obtained using MRA technique without contrast. COMPARISON:  CT HEAD Oct 01, 2016 at 1612 hours and MRI head August 16, 2016 and MRA head August 17, 2016 FINDINGS: MRI HEAD FINDINGS BRAIN: Curvilinear reduced diffusion LEFT posterior frontal lobe precentral gyrus extending to centrum semiovale, with low ADC values. Residual subcentimeter reduced diffusion LEFT temporal lobe, persistent low ADC values. No susceptibility artifact to suggest hemorrhage. No midline shift, mass effect or masses. Old RIGHT occipital lobe, biparietal, LEFT frontal lobe encephalomalacia. Ex vacuo dilatation of subjacent ventricles. No hydrocephalus. Prominent LEFT greater than RIGHT thalami perivascular spaces associated with chronic hypertension. Old small bilateral cerebellar infarcts. No  abnormal extra-axial fluid collections. VASCULAR: Normal major intracranial vascular flow voids present at skull base. SKULL AND UPPER CERVICAL SPINE: No abnormal sellar expansion. No suspicious calvarial bone marrow signal. Craniocervical junction maintained. SINUSES/ORBITS: Trace paranasal sinus mucosal thickening. The included ocular globes and orbital contents are non-suspicious. Status post bilateral ocular lens implants. OTHER: Patient is edentulous. MRA HEAD FINDINGS ANTERIOR CIRCULATION: Flow related enhancement within bilateral internal carotid artery's; luminal irregularities of the carotid siphons with mild tandem stenosis due to calcific atherosclerosis as seen on today's head CT. RIGHT A1 segment is dominant, patent anterior communicating with normal flow related enhancement bilateral anterior cerebral artery's. Normal flow related enhancement bilateral middle cerebral artery's. No large vessel occlusion, high-grade stenosis, abnormal luminal irregularity, aneurysm. POSTERIOR CIRCULATION: LEFT vertebral artery is dominant. Basilar artery is patent, with normal flow related enhancement of the main branch vessels. Normal flow  related enhancement of the posterior cerebral arteries. No large vessel occlusion, high-grade stenosis, abnormal luminal irregularity, aneurysm. ANATOMIC VARIANTS: None. Source images and MIP images were reviewed. IMPRESSION: MRI HEAD: Acute small LEFT frontal lobe nonhemorrhagic infarct. Subacute subcentimeter LEFT temporal lobe infarct though, considering ADC values, acute component is possible. Old biparietal and LEFT frontal lobe infarcts (MCA territories), old bioccipital lobe infarcts (PCA territories). Old small cerebellar infarcts. MRA HEAD: No emergent large vessel occlusion or severe stenosis. Atherosclerosis resulting in mild stenosis bilateral carotid siphons ICAs. Electronically Signed   By: Elon Alas M.D.   On: 10/01/2016 22:34   Dg Chest Port 1 View  Result  Date: 09/04/2016 CLINICAL DATA:  PICC line placement EXAM: PORTABLE CHEST 1 VIEW COMPARISON:  09/01/2016 FINDINGS: Stable cardiomegaly with aortic atherosclerosis. New left-sided PICC line tip terminates in the distal SVC. New confluent airspace opacities are seen in the right lower lobe concerning for pneumonia. Apical pleuroparenchymal thickening and scarring seen at the right lung apex. No acute osseous abnormality. IMPRESSION: 1. PICC line tip in the distal SVC. 2. New confluent airspace opacity in the right lung base suspicious for pneumonia. 3. Cardiomegaly with aortic atherosclerosis. Electronically Signed   By: Ashley Royalty M.D.   On: 09/04/2016 21:11   Mr Jodene Nam Head/brain VE Cm  Result Date: 10/01/2016 CLINICAL DATA:  RIGHT arm weakness and gait abnormality at noon today. History of hypertension, stroke, diabetes, lung cancer. EXAM: MRI HEAD WITHOUT CONTRAST MRA HEAD WITHOUT CONTRAST TECHNIQUE: Multiplanar, multiecho pulse sequences of the brain and surrounding structures were obtained without intravenous contrast. Angiographic images of the head were obtained using MRA technique without contrast. COMPARISON:  CT HEAD Oct 01, 2016 at 1612 hours and MRI head August 16, 2016 and MRA head August 17, 2016 FINDINGS: MRI HEAD FINDINGS BRAIN: Curvilinear reduced diffusion LEFT posterior frontal lobe precentral gyrus extending to centrum semiovale, with low ADC values. Residual subcentimeter reduced diffusion LEFT temporal lobe, persistent low ADC values. No susceptibility artifact to suggest hemorrhage. No midline shift, mass effect or masses. Old RIGHT occipital lobe, biparietal, LEFT frontal lobe encephalomalacia. Ex vacuo dilatation of subjacent ventricles. No hydrocephalus. Prominent LEFT greater than RIGHT thalami perivascular spaces associated with chronic hypertension. Old small bilateral cerebellar infarcts. No abnormal extra-axial fluid collections. VASCULAR: Normal major intracranial vascular flow voids  present at skull base. SKULL AND UPPER CERVICAL SPINE: No abnormal sellar expansion. No suspicious calvarial bone marrow signal. Craniocervical junction maintained. SINUSES/ORBITS: Trace paranasal sinus mucosal thickening. The included ocular globes and orbital contents are non-suspicious. Status post bilateral ocular lens implants. OTHER: Patient is edentulous. MRA HEAD FINDINGS ANTERIOR CIRCULATION: Flow related enhancement within bilateral internal carotid artery's; luminal irregularities of the carotid siphons with mild tandem stenosis due to calcific atherosclerosis as seen on today's head CT. RIGHT A1 segment is dominant, patent anterior communicating with normal flow related enhancement bilateral anterior cerebral artery's. Normal flow related enhancement bilateral middle cerebral artery's. No large vessel occlusion, high-grade stenosis, abnormal luminal irregularity, aneurysm. POSTERIOR CIRCULATION: LEFT vertebral artery is dominant. Basilar artery is patent, with normal flow related enhancement of the main branch vessels. Normal flow related enhancement of the posterior cerebral arteries. No large vessel occlusion, high-grade stenosis, abnormal luminal irregularity, aneurysm. ANATOMIC VARIANTS: None. Source images and MIP images were reviewed. IMPRESSION: MRI HEAD: Acute small LEFT frontal lobe nonhemorrhagic infarct. Subacute subcentimeter LEFT temporal lobe infarct though, considering ADC values, acute component is possible. Old biparietal and LEFT frontal lobe infarcts (MCA territories), old bioccipital lobe infarcts (PCA  territories). Old small cerebellar infarcts. MRA HEAD: No emergent large vessel occlusion or severe stenosis. Atherosclerosis resulting in mild stenosis bilateral carotid siphons ICAs. Electronically Signed   By: Elon Alas M.D.   On: 10/01/2016 22:34   Ct Head Code Stroke W/o Cm  Result Date: 10/01/2016 CLINICAL DATA:  Code stroke. 83 year old female with right side weakness  and abnormal gait. Last seen normal 1230 hours. EXAM: CT HEAD WITHOUT CONTRAST TECHNIQUE: Contiguous axial images were obtained from the base of the skull through the vertex without intravenous contrast. COMPARISON:  Head CT 05/11/2010. Brain MRI and intracranial MRA 08/16/2016 FINDINGS: Brain: Scattered chronic cortical encephalomalacia in the bilateral posterior MCA, anterior left MCA, and right PCA territories. Small chronic infarcts in the left cerebellar hemisphere. No cortically based acute infarct identified. No acute intracranial hemorrhage identified. No midline shift, mass effect, or evidence of intracranial mass lesion. Stable ventricle size and configuration. Vascular: Extensive Calcified atherosclerosis at the skull base. No suspicious intracranial vascular hyperdensity. Skull: No acute osseous abnormality identified. Hyperostosis of the calvarium, normal variant. Sinuses/Orbits: Visualized paranasal sinuses and mastoids are stable and well pneumatized. Other: No acute orbit or scalp soft tissue findings. ASPECTS [facility] Stroke Program Early CT Score) - Ganglionic level infarction (caudate, lentiform nuclei, internal capsule, insula, M1-M3 cortex): 7 - Supraganglionic infarction (M4-M6 cortex): 3 Total score (0-10 with 10 being normal): 10 IMPRESSION: 1. Bilateral chronic ischemic disease appears stable since 08/16/2016. No acute cortically based infarct or acute intracranial hemorrhage identified. 2. ASPECTS is 10. 3. The above was relayed via text pager to E. Lindzen on 10/01/2016 at 16:19 . Electronically Signed   By: Genevie Ann M.D.   On: 10/01/2016 16:19    Time Spent in minutes  30   Lala Lund M.D on 10/04/2016 at 9:46 AM  Between 7am to 7pm - Pager - [phone] ( page via [email or web address], text pages only, please mention full 10 digit call back number). After 7pm go to [email or web address] - password [email or web address]

## 2016-10-05 DIAGNOSIS — I10 Essential (primary) hypertension: Secondary | ICD-10-CM | POA: Diagnosis not present

## 2016-10-05 DIAGNOSIS — I82409 Acute embolism and thrombosis of unspecified deep veins of unspecified lower extremity: Secondary | ICD-10-CM | POA: Diagnosis not present

## 2016-10-05 DIAGNOSIS — N183 Chronic kidney disease, stage 3 (moderate): Secondary | ICD-10-CM | POA: Diagnosis not present

## 2016-10-05 DIAGNOSIS — I5042 Chronic combined systolic (congestive) and diastolic (congestive) heart failure: Secondary | ICD-10-CM | POA: Diagnosis not present

## 2016-10-05 DIAGNOSIS — M5412 Radiculopathy, cervical region: Secondary | ICD-10-CM | POA: Diagnosis not present

## 2016-10-05 DIAGNOSIS — F339 Major depressive disorder, recurrent, unspecified: Secondary | ICD-10-CM | POA: Diagnosis not present

## 2016-10-05 DIAGNOSIS — Z8673 Personal history of transient ischemic attack (TIA), and cerebral infarction without residual deficits: Secondary | ICD-10-CM | POA: Diagnosis not present

## 2016-10-05 DIAGNOSIS — E119 Type 2 diabetes mellitus without complications: Secondary | ICD-10-CM | POA: Diagnosis not present

## 2016-10-05 DIAGNOSIS — R634 Abnormal weight loss: Secondary | ICD-10-CM | POA: Diagnosis not present

## 2016-10-05 DIAGNOSIS — I82402 Acute embolism and thrombosis of unspecified deep veins of left lower extremity: Secondary | ICD-10-CM | POA: Diagnosis not present

## 2016-10-05 DIAGNOSIS — C3431 Malignant neoplasm of lower lobe, right bronchus or lung: Secondary | ICD-10-CM | POA: Diagnosis not present

## 2016-10-05 DIAGNOSIS — M1A9XX0 Chronic gout, unspecified, without tophus (tophi): Secondary | ICD-10-CM | POA: Diagnosis not present

## 2016-10-05 DIAGNOSIS — I69398 Other sequelae of cerebral infarction: Secondary | ICD-10-CM | POA: Diagnosis not present

## 2016-10-05 DIAGNOSIS — E1151 Type 2 diabetes mellitus with diabetic peripheral angiopathy without gangrene: Secondary | ICD-10-CM | POA: Diagnosis not present

## 2016-10-05 DIAGNOSIS — J449 Chronic obstructive pulmonary disease, unspecified: Secondary | ICD-10-CM | POA: Diagnosis not present

## 2016-10-05 DIAGNOSIS — I130 Hypertensive heart and chronic kidney disease with heart failure and stage 1 through stage 4 chronic kidney disease, or unspecified chronic kidney disease: Secondary | ICD-10-CM | POA: Diagnosis not present

## 2016-10-05 DIAGNOSIS — E039 Hypothyroidism, unspecified: Secondary | ICD-10-CM | POA: Diagnosis not present

## 2016-10-05 DIAGNOSIS — Z794 Long term (current) use of insulin: Secondary | ICD-10-CM | POA: Diagnosis not present

## 2016-10-05 DIAGNOSIS — R262 Difficulty in walking, not elsewhere classified: Secondary | ICD-10-CM | POA: Diagnosis not present

## 2016-10-05 DIAGNOSIS — K922 Gastrointestinal hemorrhage, unspecified: Secondary | ICD-10-CM | POA: Diagnosis not present

## 2016-10-05 DIAGNOSIS — M6281 Muscle weakness (generalized): Secondary | ICD-10-CM | POA: Diagnosis not present

## 2016-10-05 DIAGNOSIS — E1169 Type 2 diabetes mellitus with other specified complication: Secondary | ICD-10-CM | POA: Diagnosis not present

## 2016-10-05 DIAGNOSIS — I639 Cerebral infarction, unspecified: Secondary | ICD-10-CM | POA: Diagnosis not present

## 2016-10-05 DIAGNOSIS — H409 Unspecified glaucoma: Secondary | ICD-10-CM | POA: Diagnosis not present

## 2016-10-05 DIAGNOSIS — F329 Major depressive disorder, single episode, unspecified: Secondary | ICD-10-CM | POA: Diagnosis not present

## 2016-10-05 DIAGNOSIS — R1311 Dysphagia, oral phase: Secondary | ICD-10-CM | POA: Diagnosis not present

## 2016-10-05 DIAGNOSIS — I480 Paroxysmal atrial fibrillation: Secondary | ICD-10-CM | POA: Diagnosis not present

## 2016-10-05 DIAGNOSIS — J310 Chronic rhinitis: Secondary | ICD-10-CM | POA: Diagnosis not present

## 2016-10-05 DIAGNOSIS — C3491 Malignant neoplasm of unspecified part of right bronchus or lung: Secondary | ICD-10-CM | POA: Diagnosis not present

## 2016-10-05 DIAGNOSIS — I2510 Atherosclerotic heart disease of native coronary artery without angina pectoris: Secondary | ICD-10-CM | POA: Diagnosis not present

## 2016-10-05 DIAGNOSIS — R531 Weakness: Secondary | ICD-10-CM | POA: Diagnosis not present

## 2016-10-05 DIAGNOSIS — N184 Chronic kidney disease, stage 4 (severe): Secondary | ICD-10-CM | POA: Diagnosis not present

## 2016-10-05 DIAGNOSIS — E785 Hyperlipidemia, unspecified: Secondary | ICD-10-CM | POA: Diagnosis not present

## 2016-10-05 DIAGNOSIS — R488 Other symbolic dysfunctions: Secondary | ICD-10-CM | POA: Diagnosis not present

## 2016-10-05 DIAGNOSIS — F419 Anxiety disorder, unspecified: Secondary | ICD-10-CM | POA: Diagnosis not present

## 2016-10-05 DIAGNOSIS — R51 Headache: Secondary | ICD-10-CM | POA: Diagnosis not present

## 2016-10-05 LAB — BASIC METABOLIC PANEL
Anion gap: 12 (ref 5–15)
BUN: 21 mg/dL — AB (ref 6–20)
CHLORIDE: 106 mmol/L (ref 101–111)
CO2: 19 mmol/L — AB (ref 22–32)
CREATININE: 2.01 mg/dL — AB (ref 0.44–1.00)
Calcium: 9.4 mg/dL (ref 8.9–10.3)
GFR calc Af Amer: 25 mL/min — ABNORMAL LOW (ref >60)
GFR calc non Af Amer: 22 mL/min — ABNORMAL LOW (ref >60)
Glucose, Bld: 233 mg/dL — ABNORMAL HIGH (ref 65–99)
Potassium: 4.2 mmol/L (ref 3.5–5.1)
Sodium: 137 mmol/L (ref 135–145)

## 2016-10-05 LAB — MAGNESIUM: MAGNESIUM: 1.6 mg/dL — AB (ref 1.7–2.4)

## 2016-10-05 LAB — GLUCOSE, CAPILLARY
GLUCOSE-CAPILLARY: 138 mg/dL — AB (ref 65–99)
Glucose-Capillary: 121 mg/dL — ABNORMAL HIGH (ref 65–99)

## 2016-10-05 MED ORDER — INSULIN LISPRO 100 UNIT/ML (KWIKPEN): PEN_INJECTOR | SUBCUTANEOUS | 11 refills | Status: DC

## 2016-10-05 MED ORDER — DOCUSATE SODIUM 100 MG PO CAPS: 200.0000 mg | ORAL_CAPSULE | Freq: Two times a day (BID) | ORAL | Status: DC

## 2016-10-05 MED ORDER — INSULIN GLARGINE 100 UNITS/ML SOLOSTAR PEN: 10.0000 [IU] | PEN_INJECTOR | Freq: Every day | SUBCUTANEOUS | 11 refills | Status: DC

## 2016-10-05 MED ORDER — BISACODYL 10 MG RE SUPP: 10.0000 mg | Freq: Once | RECTAL | Status: DC

## 2016-10-05 NOTE — Progress Notes (Signed)
RN gave report to Earnest Bailey at [facility]. Patient aware of transfer after 2pm to [facility].

## 2016-10-05 NOTE — NC FL2 (Signed)
[place] LEVEL OF CARE SCREENING TOOL     IDENTIFICATION  Patient Name: Erica Jimenez Birthdate: 1929/12/28 Sex: female Admission Date (Current Location): 10/01/2016  [facility] and [place] Number:  Herbalist and Address:  [facility] [place] [address] [place], [place] [address]      Provider Number: [id number]  Attending Physician Name and Address:  Thurnell Lose, MD  Relative Name and Phone Number:       Current Level of Care: Hospital Recommended Level of Care: [place] Prior Approval Number:    Date Approved/Denied:   PASRR Number: [id number]  Discharge Plan: SNF    Current Diagnoses: Patient Active Problem List   Diagnosis Date Noted  . Cerebral thrombosis with cerebral infarction 10/01/2016  . Right sided weakness 10/01/2016  . Bleeding acute gastric ulcer   . SOB (shortness of breath)   . Encounter for palliative care   . Malnutrition of moderate degree 09/02/2016  . Acute upper GI bleed   . Duodenal ulcer with hemorrhage   . Sepsis ([place]) 09/01/2016  . Goals of care, counseling/discussion 08/26/2016  . Acute cerebrovascular accident (CVA) ([place]) 08/16/2016  . Type 2 diabetes mellitus with hyperlipidemia ([place]) 08/16/2016  . CKD (chronic kidney disease), stage IV ([place]) 08/16/2016  . Anemia 08/16/2016  . Stage IV squamous cell carcinoma of right lung ([place]) 08/14/2016  . Lung cancer ([place]) 08/12/2016  . Right lower lobe lung mass 07/21/2016  . Smoking 08/30/2010  . Aortic valve disorder 02/12/2009  . CARDIOMYOPATHY, ISCHEMIC 08/14/2008  . Hyperlipidemia 07/11/2008  . CAD, NATIVE VESSEL 07/11/2008  . BRADYCARDIA 07/11/2008  . Peripheral vascular disease ([place]) 07/11/2008  . CARDIAC MURMUR 07/11/2008  . CAROTID BRUIT 07/11/2008    Orientation RESPIRATION BLADDER Height & Weight     Self, Time, Situation, Place  Normal Continent Weight: 153 lb 3.2 oz (69.5 kg) Height:  5\' 4"  (162.6 cm)   BEHAVIORAL SYMPTOMS/MOOD NEUROLOGICAL BOWEL NUTRITION STATUS      Continent Diet (cardiac, carb modified)  AMBULATORY STATUS COMMUNICATION OF NEEDS Skin   Limited Assist Verbally Normal                       Personal Care Assistance Level of Assistance  Bathing, Dressing, Feeding Bathing Assistance: Limited assistance Feeding assistance: Limited assistance Dressing Assistance: Limited assistance     Functional Limitations Info             SPECIAL CARE FACTORS FREQUENCY  PT (By licensed PT), OT (By licensed OT)     PT Frequency: 5x/wk OT Frequency: 5x/wk            Contractures      Additional Factors Info  Code Status, Allergies, Psychotropic, Insulin Sliding Scale Code Status Info: DNR Allergies Info: Contrast Media Iodinated Diagnostic Agents, Fluoxetine, Sulfonamide Derivatives Psychotropic Info: Celexa 20mg  Insulin Sliding Scale Info: 3x/day       Current Medications (10/05/2016):  This is the current hospital active medication list Current Facility-Administered Medications  Medication Dose Route Frequency Provider Last Rate Last Dose  . acetaminophen (TYLENOL) tablet 650 mg  650 mg Oral Q4H PRN Hosie Poisson, MD   650 mg at 10/03/16 2213  . atorvastatin (LIPITOR) tablet 80 mg  80 mg Oral QPC supper Hosie Poisson, MD   80 mg at 10/04/16 1749  . bisacodyl (DULCOLAX) suppository 10 mg  10 mg Rectal Once Thurnell Lose, MD      .  brinzolamide (AZOPT) 1 % ophthalmic suspension 1 drop  1 drop Both Eyes BID Hosie Poisson, MD   1 drop at 10/05/16 0901  . calcitRIOL (ROCALTROL) capsule 0.25 mcg  0.25 mcg Oral Q M,W,F Hosie Poisson, MD   0.25 mcg at 10/03/16 0946  . citalopram (CELEXA) tablet 20 mg  20 mg Oral QPC supper Hosie Poisson, MD   20 mg at 10/04/16 1749  . clopidogrel (PLAVIX) tablet 75 mg  75 mg Oral Daily Garvin Fila, MD   75 mg at 10/05/16 0901  . docusate sodium (COLACE) capsule 200 mg  200 mg Oral BID Thurnell Lose, MD      . feeding  supplement (ENSURE ENLIVE) (ENSURE ENLIVE) liquid 237 mL  237 mL Oral TID BM Thurnell Lose, MD   237 mL at 10/05/16 0904  . folic acid (FOLVITE) tablet 1 mg  1 mg Oral Daily Hosie Poisson, MD   1 mg at 10/05/16 0901  . furosemide (LASIX) tablet 40 mg  40 mg Oral Daily Thurnell Lose, MD   40 mg at 10/05/16 0901  . insulin aspart (novoLOG) injection 0-9 Units  0-9 Units Subcutaneous TID WC Hosie Poisson, MD   1 Units at 10/05/16 0901  . insulin glargine (LANTUS) injection 11 Units  11 Units Subcutaneous QAC breakfast Thurnell Lose, MD   11 Units at 10/05/16 0901  . isosorbide mononitrate (IMDUR) 24 hr tablet 60 mg  60 mg Oral QPC supper Thurnell Lose, MD   60 mg at 10/04/16 1749  . latanoprost (XALATAN) 0.005 % ophthalmic solution 1 drop  1 drop Both Eyes QHS Hosie Poisson, MD   1 drop at 10/04/16 2037  . levothyroxine (SYNTHROID, LEVOTHROID) tablet 50 mcg  50 mcg Oral QAC breakfast Hosie Poisson, MD   50 mcg at 10/05/16 0900  . nitroGLYCERIN (NITROSTAT) SL tablet 0.4 mg  0.4 mg Sublingual Q5 min PRN Hosie Poisson, MD      . pantoprazole (PROTONIX) EC tablet 40 mg  40 mg Oral BID Hosie Poisson, MD   40 mg at 10/05/16 0901  . senna-docusate (Senokot-S) tablet 1 tablet  1 tablet Oral QHS PRN Hosie Poisson, MD   1 tablet at 10/04/16 2036     Discharge Medications: Please see discharge summary for a list of discharge medications.  Relevant Imaging Results:  Relevant Lab Results:   Additional Information SS#: [id number]  Wende Neighbors, LCSW

## 2016-10-05 NOTE — Progress Notes (Signed)
RN discussed discharge instructions withpatient. She understands medication changes, f./u appts, and s./sx of stroke. Neuro assessment is unchanged. Iv removed. Will continue to monitor until discharge

## 2016-10-05 NOTE — Progress Notes (Signed)
Clinical Social Worker facilitated patient discharge including contacting patient family and facility to confirm patient discharge plans.  Clinical information faxed to facility and family agreeable with plan.  CSW arranged ambulance transport via [facility] to [facility].  RN to call [phone] (pt will be placed in rm# [phone]) report prior to discharge.  Clinical Social Worker will sign off for now as social work intervention is no longer needed. Please consult [place] again if new need arises.  Rhea Pink, MSW, [place]

## 2016-10-05 NOTE — Discharge Instructions (Signed)
Follow with Primary MD [name] (Inactive) in 7 days   Get CBC, CMP, 2 view Chest X ray checked  by Primary MD or SNF MD in 5-7 days ( we routinely change or add medications that can affect your baseline labs and fluid status, therefore we recommend that you get the mentioned basic workup next visit with your PCP, your PCP may decide not to get them or add new tests based on their clinical decision)  Activity: As tolerated with Full fall precautions use walker/cane & assistance as needed  Disposition SNF  Diet:   Diet heart healthy/carb modified with feeding assistance and aspiration precautions.  Accuchecks 4 times/day, Once in AM empty stomach and then before each meal. Log in all results and show them to your Prim.MD in 3 days. If any glucose reading is under 80 or above 300 call your Prim MD immidiately. Follow Low glucose instructions for glucose under 80 as instructed.   For Heart failure patients - Check your Weight same time everyday, if you gain over 2 pounds, or you develop in leg swelling, experience more shortness of breath or chest pain, call your Primary MD immediately. Follow Cardiac Low Salt Diet and 1.5 lit/day fluid restriction.  On your next visit with your primary care physician please Get Medicines reviewed and adjusted.  Please request your Prim.MD to go over all Hospital Tests and Procedure/Radiological results at the follow up, please get all Hospital records sent to your Prim MD by signing hospital release before you go home.  If you experience worsening of your admission symptoms, develop shortness of breath, life threatening emergency, suicidal or homicidal thoughts you must seek medical attention immediately by calling 911 or calling your MD immediately  if symptoms less severe.  You Must read complete instructions/literature along with all the possible adverse reactions/side effects for all the Medicines you take and that have been prescribed to you. Take  any new Medicines after you have completely understood and accpet all the possible adverse reactions/side effects.   Do not drive, operate heavy machinery, perform activities at heights, swimming or participation in water activities or provide baby sitting services if your were admitted for syncope or siezures until you have seen by Primary MD or a Neurologist and advised to do so again.  Do not drive when taking Pain medications.    Do not take more than prescribed Pain, Sleep and Anxiety Medications  Special Instructions: If you have smoked or chewed Tobacco  in the last 2 yrs please stop smoking, stop any regular Alcohol  and or any Recreational drug use.  Wear Seat belts while driving.   Please note  You were cared for by a hospitalist during your hospital stay. If you have any questions about your discharge medications or the care you received while you were in the hospital after you are discharged, you can call the unit and asked to speak with the hospitalist on call if the hospitalist that took care of you is not available. Once you are discharged, your primary care physician will handle any further medical issues. Please note that NO REFILLS for any discharge medications will be authorized once you are discharged, as it is imperative that you return to your primary care physician (or establish a relationship with a primary care physician if you do not have one) for your aftercare needs so that they can reassess your need for medications and monitor your lab values.

## 2016-10-05 NOTE — Clinical Social Work Placement (Signed)
   CLINICAL SOCIAL WORK PLACEMENT  NOTE  Date:  10/05/2016  Patient Details  Name: MANAL KREUTZER MRN: [id number] Date of Birth: December 04, 1929  Clinical Social Work is seeking post-discharge placement for this patient at the [place] level of care (*CSW will initial, date and re-position this form in  chart as items are completed):  Yes   Patient/family provided with [place] Work Department's list of facilities offering this level of care within the geographic area requested by the patient (or if unable, by the patient's family).  Yes   Patient/family informed of their freedom to choose among providers that offer the needed level of care, that participate in Medicare, Medicaid or managed care program needed by the patient, have an available bed and are willing to accept the patient.  Yes   Patient/family informed of [facility]'s ownership interest in [facility] and [facility], as well as of the fact that they are under no obligation to receive care at these facilities.  PASRR submitted to EDS on       PASRR number received on       Existing PASRR number confirmed on 10/05/16     FL2 transmitted to all facilities in geographic area requested by pt/family on       FL2 transmitted to all facilities within larger geographic area on       Patient informed that his/her managed care company has contracts with or will negotiate with certain facilities, including the following:        Yes   Patient/family informed of bed offers received.  Patient chooses bed at [facility] recommends and patient chooses bed at      Patient to be [facility] to [facility] on 10[facility].  Patient to be transferred to facility by [facility]     Patient family notified on   of transfer.  Name of family member notified:  peggy davis     PHYSICIAN       Additional Comment:     _______________________________________________ Wende Neighbors, LCSW 10/05/2016, 12:30 PM

## 2016-10-05 NOTE — Progress Notes (Signed)
[place] with [facility] to confirm paper work completed with patient's daughter yesterday. Understand plan is for patient to transfer to SNF for rehab. CSW Ashly aware.   Erling Conte, [place]

## 2016-10-05 NOTE — Discharge Summary (Signed)
LIVELY HABERMAN NTI:[id number] DOB: [birth year] DOA: 10/01/2016  PCP: [name] (Inactive)  Admit date: 10/01/2016  Discharge date: 10/05/2016  Admitted From: Home   Disposition:  SNF   Recommendations for Outpatient Follow-up:   Follow up with PCP in 1-2 weeks  PCP Please obtain BMP/CBC, 2 view CXR in 1week,  (see Discharge instructions)   PCP Please follow up on the following pending results: [place]: None   Equipment/Devices: None  Consultations: Neuro Discharge Condition: Fair   CODE STATUS: DNR   Diet Recommendation: Diet heart healthy/carb modified    Chief Complaint  Patient presents with  . Code Stroke     Brief history of present illness from the day of admission and additional interim summary    Erica Jimenez a 83 y.o.femalewith medical history significant of CAD, PVD, HYPERTENSION, CVA, DM, COPD, right side lunc cancer, follows with [name], presents to day to ED as code stroke as she reports right arm weakness and gait abnormality. She noticed the right arm weakness around 12 pm.. She is currently on hospice but wanted to be treated for all treatable conditions. tPA was not given as risk is greater than benefit and pt did not want to go ahead with the tPA. She also reports 3 to 4 bowel movements earlier today. She denies any other complaints.                                                                  Hospital Course    1.R.sided weakness Acute small LEFT frontal lobe nonhemorrhagic infarct. Subacute subcentimeter LEFT temporal lobe infarct though - has H/O Afib not on Anticoagulation due to risk of GI bleeding and falls. She actually had a major GI bleed 1 month ago requiring 4 units of packed RBC transfusion. She is also lung cancer patient who was on home  hospice.  Seen by neurology currently on Plavix and statin along with PPI twice a day, risks and benefits of stroke versus GI bleeding discussed in detail with patient and her daughter, seen by stroke team, deficits largely resolved with minimal left arm weakness, will need SNF. CIR saw the patient but she did not qualify, Patient and family agreeable to [facility] where she will be discharged today.  2. Stage III non-small cell lung cancer. Under the care of Dr. [name], patient has decided to stop all chemotherapy several weeks ago and was under the care of home hospice which will be continued once she reaches home.  3. Recent history of upper GI bleed. Stable on twice a day PPI will continue to monitor.  4. Paroxysmal atrial fibrillation [place] vasc 2 score of at least 6. Not on rate controlling agents, not on anticoagulation due to fall risk and GI platelet risk, continue  Plavix for now.  5. Dyslipidemia. Statin maximized. LDL was above goal.  6. Hypothyroidism - on Synthroid continue   7. Chronic systolic and diastolic CHF EF 78-58%. Currently compensated home dose Lasix and Imdur resumed, will monitor. Not on ACE/ARB due to renal insufficiency.   8. CKD 3. Baseline creatinine around 1.8 to 1.9. At baseline  9. Non-ACS pattern flat rise in troponin. No chest pain, EKG nonacute, echocardiogram shows depressed EF and wall motion abnormality, with her lung cancer, hospice status, recent GI bleed she is not a candidate for invasive procedures or interventions, continue Plavix and statin.  10. Left lower extremity below knee DVT. Plan discussed with vascular surgeon Dr. [name], patient, patient's daughter. Risks and benefits explained. In the light of her recent GI bleed, poor functional status, lung cancer and hospice status. Best approach will be to repeat ultrasound in 3-4 weeks, if the clot is not propagating monitor clinically, if it's enlarging then she should get an IVC filter.    11. DM type II. On Lantus and sliding scale.  Lantus dose dropped from 12 units to 10 units daily, monitor CBGs before every meal at bedtime.   Discharge diagnosis     Principal Problem:   Right sided weakness Active Problems:   Hyperlipidemia   Acute cerebrovascular accident (CVA) ([place])   Type 2 diabetes mellitus with hyperlipidemia (H[facility])   Acute upper GI bleed   Cerebral thrombosis with cerebral infarction    Discharge instructions    Discharge Instructions    Discharge instructions    Complete by:  As directed    Follow with Primary MD [name] (Inactive) in 7 days   Get CBC, CMP, 2 view Chest X ray checked  by Primary MD or SNF MD in 5-7 days ( we routinely change or add medications that can affect your baseline labs and fluid status, therefore we recommend that you get the mentioned basic workup next visit with your PCP, your PCP may decide not to get them or add new tests based on their clinical decision)  Activity: As tolerated with Full fall precautions use walker/cane & assistance as needed  Disposition SNF  Diet:   Diet heart healthy/carb modified with feeding assistance and aspiration precautions.  Accuchecks 4 times/day, Once in AM empty stomach and then before each meal. Log in all results and show them to your Prim.MD in 3 days. If any glucose reading is under 80 or above 300 call your Prim MD immidiately. Follow Low glucose instructions for glucose under 80 as instructed.   For Heart failure patients - Check your Weight same time everyday, if you gain over 2 pounds, or you develop in leg swelling, experience more shortness of breath or chest pain, call your Primary MD immediately. Follow Cardiac Low Salt Diet and 1.5 lit/day fluid restriction.  On your next visit with your primary care physician please Get Medicines reviewed and adjusted.  Please request your Prim.MD to go over all Hospital Tests and Procedure/Radiological results at the follow  up, please get all Hospital records sent to your Prim MD by signing hospital release before you go home.  If you experience worsening of your admission symptoms, develop shortness of breath, life threatening emergency, suicidal or homicidal thoughts you must seek medical attention immediately by calling 911 or calling your MD immediately  if symptoms less severe.  You Must read complete instructions/literature along with all the possible adverse reactions/side effects for all the Medicines you take and that have been  prescribed to you. Take any new Medicines after you have completely understood and accpet all the possible adverse reactions/side effects.   Do not drive, operate heavy machinery, perform activities at heights, swimming or participation in water activities or provide baby sitting services if your were admitted for syncope or siezures until you have seen by Primary MD or a Neurologist and advised to do so again.  Do not drive when taking Pain medications.    Do not take more than prescribed Pain, Sleep and Anxiety Medications  Special Instructions: If you have smoked or chewed Tobacco  in the last 2 yrs please stop smoking, stop any regular Alcohol  and or any Recreational drug use.  Wear Seat belts while driving.   Please note  You were cared for by a hospitalist during your hospital stay. If you have any questions about your discharge medications or the care you received while you were in the hospital after you are discharged, you can call the unit and asked to speak with the hospitalist on call if the hospitalist that took care of you is not available. Once you are discharged, your primary care physician will handle any further medical issues. Please note that NO REFILLS for any discharge medications will be authorized once you are discharged, as it is imperative that you return to your primary care physician (or establish a relationship with a primary care physician if you do not  have one) for your aftercare needs so that they can reassess your need for medications and monitor your lab values.   Increase activity slowly    Complete by:  As directed       Discharge Medications   Allergies as of 10/05/2016      Reactions   Contrast Media [iodinated Diagnostic Agents] Other (See Comments)   Renal insufficiency   Fluoxetine Rash   Sulfonamide Derivatives Nausea Only      Medication List    STOP taking these medications   aspirin 325 MG tablet     TAKE these medications   atorvastatin 80 MG tablet Commonly known as:  LIPITOR Take 80 mg by mouth daily after supper.   bisoprolol 5 MG tablet Commonly known as:  ZEBETA take 1/2 tablet by mouth once daily **PLEASE KEEP UPCOMING APPOINTMENT FOR FURTHER REFILLS**   brinzolamide 1 % ophthalmic suspension Commonly known as:  AZOPT Place 1 drop into both eyes 2 (two) times daily.   citalopram 10 MG tablet Commonly known as:  CELEXA Take 2 tablets (20 mg total) by mouth daily after supper.   clopidogrel 75 MG tablet Commonly known as:  PLAVIX Take 75 mg by mouth daily.   febuxostat 40 MG tablet Commonly known as:  ULORIC Take 40 mg by mouth daily.   feeding supplement Liqd Take 1 Container by mouth 3 (three) times daily between meals.   feeding supplement (ENSURE ENLIVE) Liqd Take 237 mLs by mouth 2 (two) times daily between meals.   FERGON PO Take 1 tablet by mouth daily after supper.   folic acid 1 MG tablet Commonly known as:  FOLVITE Take 1 mg by mouth daily.   furosemide 40 MG tablet Commonly known as:  LASIX Take 40 mg by mouth daily.   hydrALAZINE 50 MG tablet Commonly known as:  APRESOLINE Take 1 tablet (50 mg total) by mouth 2 (two) times daily.   insulin glargine 100 unit/mL Sopn Commonly known as:  LANTUS Inject 0.1 mLs (10 Units total) into the skin daily before breakfast.  What changed:  how much to take   insulin lispro 100 UNIT/ML KiwkPen Commonly known as:  HUMALOG  KWIKPEN Before each meal 3 times a day, 140-199 - 2 units, 200-250 - 4 units, 251-299 - 6 units,  300-349 - 8 units,  350 or above 10 units. Insulin PEN if approved, provide syringes and needles if needed. What changed:  how much to take  how to take this  when to take this  additional instructions   isosorbide mononitrate 60 MG 24 hr tablet Commonly known as:  IMDUR Take 60 mg by mouth daily after supper.   latanoprost 0.005 % ophthalmic solution Commonly known as:  XALATAN Place 1 drop into both eyes at bedtime.   levothyroxine 50 MCG tablet Commonly known as:  SYNTHROID, LEVOTHROID Take 50 mcg by mouth daily after supper.   lidocaine-prilocaine cream Commonly known as:  EMLA Apply 1 application topically as needed. What changed:  when to take this  reasons to take this   LORazepam 0.5 MG tablet Commonly known as:  ATIVAN Take 0.5 mg by mouth every 4 (four) hours as needed for anxiety (short of breath).   nitroGLYCERIN 0.4 MG SL tablet Commonly known as:  NITROSTAT Place 1 tablet (0.4 mg total) under the tongue every 5 (five) minutes as needed for chest pain.   pantoprazole 40 MG tablet Commonly known as:  PROTONIX Take 1 tablet (40 mg total) by mouth 2 (two) times daily.   potassium chloride SA 20 MEQ tablet Commonly known as:  K-DUR,KLOR-CON Take 20 mEq by mouth 2 (two) times daily.   REFRESH TEARS OP Place 1 drop into both eyes 3 (three) times daily as needed (dry eyes). Both eyes.        Contact information for follow-up providers    [name]. Schedule an appointment as soon as possible for a visit in 1 week(s).   Specialty:  Surgery Contact information: [place]            Contact information for after-discharge care    Destination    [facility] Follow up.   Specialty:  [place] information: [address] [place] [phone]                   Major procedures and Radiology Reports - PLEASE review detailed and final reports thoroughly  -     Vascular Ultrasound  Bilateral lower extremity venous duplexhas been completed. Preliminary findings: Findings suggest deep vein thrombosis in the left tibioperonaltrunk and posterior tibial veins of the proximal calf. No evidence of deep vein thrombosis in the right lower extremity. Incidentally noted: low level echoes in the right femoral artery, soft plaque vs thrombosis.  TTE   - Left ventricle: The cavity size was normal. Wall thickness wasnormal. Systolic function was moderately reduced. The estimated ejection fraction was in the range of 35% to 40%. Anterior,anteroseptal, apical and distal anteroapical severe hypokinesisto akinesis - suggestive of LAD territroy ischemia/infarct. Doppler parameters are consistent with abnormal left ventricularrelaxation (grade 1 diastolic dysfunction). The E/e&' ratio is>15, suggesting elevated LV filling pressure. - Aortic valve: Trileaflet. Sclerosis without stenosis. There wastrivial regurgitation. - Mitral valve: Mildly thickened leaflets . There was mildregurgitation. - Left atrium: The atrium was mildly dilated. - Inferior vena cava: The vessel was normal in size. Therespirophasic diameter changes were in the normal range (= 50%),consistent with normal central venous pressure.  Impressions:   Compared to a prior study in  08/2016, the LVEF is lower at 35-40% with persistent LAD territory wall motion abnormality. There is coarse apical trabeculation, but no obvious mural thrombus.   Dg Chest 2 View  Result Date: 10/01/2016 CLINICAL DATA:  Acute onset of right upper extremity weakness. Initial encounter. EXAM: CHEST  2 VIEW COMPARISON:  Chest radiograph from 09/04/2016 FINDINGS: The lungs are well-aerated. Minimal residual right basilar airspace opacity may reflect mild residual pneumonia. Mild scarring is noted at the right lung apex.  There is no evidence of pleural effusion or pneumothorax. The heart is mildly enlarged. No acute osseous abnormalities are seen. IMPRESSION: 1. Minimal residual right basilar airspace opacity may reflect mild residual pneumonia, improved from the prior study. 2. Mild scarring at the right lung apex. 3. Mild cardiomegaly. Electronically Signed   By: Garald Balding M.D.   On: 10/01/2016 22:34   [name] Contrast  Result Date: 10/01/2016 CLINICAL DATA:  RIGHT arm weakness and gait abnormality at noon today. History of hypertension, stroke, diabetes, lung cancer. EXAM: MRI HEAD WITHOUT CONTRAST MRA HEAD WITHOUT CONTRAST TECHNIQUE: Multiplanar, multiecho pulse sequences of the brain and surrounding structures were obtained without intravenous contrast. Angiographic images of the head were obtained using MRA technique without contrast. COMPARISON:  CT HEAD Oct 01, 2016 at 1612 hours and MRI head August 16, 2016 and MRA head August 17, 2016 FINDINGS: MRI HEAD FINDINGS BRAIN: Curvilinear reduced diffusion LEFT posterior frontal lobe precentral gyrus extending to centrum semiovale, with low ADC values. Residual subcentimeter reduced diffusion LEFT temporal lobe, persistent low ADC values. No susceptibility artifact to suggest hemorrhage. No midline shift, mass effect or masses. Old RIGHT occipital lobe, biparietal, LEFT frontal lobe encephalomalacia. Ex vacuo dilatation of subjacent ventricles. No hydrocephalus. Prominent LEFT greater than RIGHT thalami perivascular spaces associated with chronic hypertension. Old small bilateral cerebellar infarcts. No abnormal extra-axial fluid collections. VASCULAR: Normal major intracranial vascular flow voids present at skull base. SKULL AND UPPER CERVICAL SPINE: No abnormal sellar expansion. No suspicious calvarial bone marrow signal. Craniocervical junction maintained. SINUSES/ORBITS: Trace paranasal sinus mucosal thickening. The included ocular globes and orbital contents are  non-suspicious. Status post bilateral ocular lens implants. OTHER: Patient is edentulous. MRA HEAD FINDINGS ANTERIOR CIRCULATION: Flow related enhancement within bilateral internal carotid artery's; luminal irregularities of the carotid siphons with mild tandem stenosis due to calcific atherosclerosis as seen on today's head CT. RIGHT A1 segment is dominant, patent anterior communicating with normal flow related enhancement bilateral anterior cerebral artery's. Normal flow related enhancement bilateral middle cerebral artery's. No large vessel occlusion, high-grade stenosis, abnormal luminal irregularity, aneurysm. POSTERIOR CIRCULATION: LEFT vertebral artery is dominant. Basilar artery is patent, with normal flow related enhancement of the main branch vessels. Normal flow related enhancement of the posterior cerebral arteries. No large vessel occlusion, high-grade stenosis, abnormal luminal irregularity, aneurysm. ANATOMIC VARIANTS: None. Source images and MIP images were reviewed. IMPRESSION: MRI HEAD: Acute small LEFT frontal lobe nonhemorrhagic infarct. Subacute subcentimeter LEFT temporal lobe infarct though, considering ADC values, acute component is possible. Old biparietal and LEFT frontal lobe infarcts (MCA territories), old bioccipital lobe infarcts (PCA territories). Old small cerebellar infarcts. MRA HEAD: No emergent large vessel occlusion or severe stenosis. Atherosclerosis resulting in mild stenosis bilateral carotid siphons ICAs. Electronically Signed   By: Elon Alas M.D.   On: 10/01/2016 22:34   Mr Jodene Nam Head/brain WN Cm  Result Date: 10/01/2016 CLINICAL DATA:  RIGHT arm weakness and gait abnormality at noon today. History of hypertension, stroke, diabetes, lung cancer. EXAM: MRI  HEAD WITHOUT CONTRAST MRA HEAD WITHOUT CONTRAST TECHNIQUE: Multiplanar, multiecho pulse sequences of the brain and surrounding structures were obtained without intravenous contrast. Angiographic images of the head  were obtained using MRA technique without contrast. COMPARISON:  CT HEAD Oct 01, 2016 at 1612 hours and MRI head August 16, 2016 and MRA head August 17, 2016 FINDINGS: MRI HEAD FINDINGS BRAIN: Curvilinear reduced diffusion LEFT posterior frontal lobe precentral gyrus extending to centrum semiovale, with low ADC values. Residual subcentimeter reduced diffusion LEFT temporal lobe, persistent low ADC values. No susceptibility artifact to suggest hemorrhage. No midline shift, mass effect or masses. Old RIGHT occipital lobe, biparietal, LEFT frontal lobe encephalomalacia. Ex vacuo dilatation of subjacent ventricles. No hydrocephalus. Prominent LEFT greater than RIGHT thalami perivascular spaces associated with chronic hypertension. Old small bilateral cerebellar infarcts. No abnormal extra-axial fluid collections. VASCULAR: Normal major intracranial vascular flow voids present at skull base. SKULL AND UPPER CERVICAL SPINE: No abnormal sellar expansion. No suspicious calvarial bone marrow signal. Craniocervical junction maintained. SINUSES/ORBITS: Trace paranasal sinus mucosal thickening. The included ocular globes and orbital contents are non-suspicious. Status post bilateral ocular lens implants. OTHER: Patient is edentulous. MRA HEAD FINDINGS ANTERIOR CIRCULATION: Flow related enhancement within bilateral internal carotid artery's; luminal irregularities of the carotid siphons with mild tandem stenosis due to calcific atherosclerosis as seen on today's head CT. RIGHT A1 segment is dominant, patent anterior communicating with normal flow related enhancement bilateral anterior cerebral artery's. Normal flow related enhancement bilateral middle cerebral artery's. No large vessel occlusion, high-grade stenosis, abnormal luminal irregularity, aneurysm. POSTERIOR CIRCULATION: LEFT vertebral artery is dominant. Basilar artery is patent, with normal flow related enhancement of the main branch vessels. Normal flow related  enhancement of the posterior cerebral arteries. No large vessel occlusion, high-grade stenosis, abnormal luminal irregularity, aneurysm. ANATOMIC VARIANTS: None. Source images and MIP images were reviewed. IMPRESSION: MRI HEAD: Acute small LEFT frontal lobe nonhemorrhagic infarct. Subacute subcentimeter LEFT temporal lobe infarct though, considering ADC values, acute component is possible. Old biparietal and LEFT frontal lobe infarcts (MCA territories), old bioccipital lobe infarcts (PCA territories). Old small cerebellar infarcts. MRA HEAD: No emergent large vessel occlusion or severe stenosis. Atherosclerosis resulting in mild stenosis bilateral carotid siphons ICAs. Electronically Signed   By: Elon Alas M.D.   On: 10/01/2016 22:34   Ct Head Code Stroke W/o Cm  Result Date: 10/01/2016 CLINICAL DATA:  Code stroke. 83 year old female with right side weakness and abnormal gait. Last seen normal 1230 hours. EXAM: CT HEAD WITHOUT CONTRAST TECHNIQUE: Contiguous axial images were obtained from the base of the skull through the vertex without intravenous contrast. COMPARISON:  Head CT 05/11/2010. Brain MRI and intracranial MRA 08/16/2016 FINDINGS: Brain: Scattered chronic cortical encephalomalacia in the bilateral posterior MCA, anterior left MCA, and right PCA territories. Small chronic infarcts in the left cerebellar hemisphere. No cortically based acute infarct identified. No acute intracranial hemorrhage identified. No midline shift, mass effect, or evidence of intracranial mass lesion. Stable ventricle size and configuration. Vascular: Extensive Calcified atherosclerosis at the skull base. No suspicious intracranial vascular hyperdensity. Skull: No acute osseous abnormality identified. Hyperostosis of the calvarium, normal variant. Sinuses/Orbits: Visualized paranasal sinuses and mastoids are stable and well pneumatized. Other: No acute orbit or scalp soft tissue findings. ASPECTS [facility] Stroke Program  Early CT Score) - Ganglionic level infarction (caudate, lentiform nuclei, internal capsule, insula, M1-M3 cortex): 7 - Supraganglionic infarction (M4-M6 cortex): 3 Total score (0-10 with 10 being normal): 10 IMPRESSION: 1. Bilateral chronic ischemic disease appears stable since 08/16/2016.  No acute cortically based infarct or acute intracranial hemorrhage identified. 2. ASPECTS is 10. 3. The above was relayed via text pager to E. Lindzen on 10/01/2016 at 16:19 . Electronically Signed   By: Genevie Ann M.D.   On: 10/01/2016 16:19    Micro Results     No results found for this or any previous visit (from the past 240 hour(s)).  Today   Subjective    Alisandra Son today has no headache,no chest abdominal pain,no new weakness tingling or numbness, feels much better, Agreeable to going to [place].     Objective   Blood pressure 127/82, pulse 87, temperature 99.1 F (37.3 C), temperature source Oral, resp. rate 20, height 5\' 4"  (1.626 m), weight 69.5 kg (153 lb 3.2 oz), SpO2 99 %.   Intake/Output Summary (Last 24 hours) at 10/05/16 0936 Last data filed at 10/05/16 0800  Gross per 24 hour  Intake             1160 ml  Output                0 ml  Net             1160 ml    Exam Awake Alert, Oriented x 3, R arm 4/5, No new F.N deficits, Normal affect [place].AT,PERRAL Supple Neck,No JVD, No cervical lymphadenopathy appriciated.  Symmetrical Chest wall movement, Good air movement bilaterally, CTAB RRR,No Gallops,Rubs or new Murmurs, No Parasternal Heave +ve B.Sounds, Abd Soft, Non tender, No organomegaly appriciated, No rebound -guarding or rigidity. No Cyanosis, Clubbing or edema, No new Rash or bruise   Data Review   CBC w Diff: Lab Results  Component Value Date   WBC 5.0 10/03/2016   HGB 10.6 (L) 10/03/2016   HGB 12.1 08/26/2016   HCT 34.2 (L) 10/03/2016   HCT 40.1 08/26/2016   PLT 180 10/03/2016   PLT 137 (L) 08/26/2016   LYMPHOPCT 23 10/01/2016   LYMPHOPCT 12.8 (L) 08/26/2016    MONOPCT 11 10/01/2016   MONOPCT 1.8 08/26/2016   EOSPCT 3 10/01/2016   EOSPCT 1.5 08/26/2016   BASOPCT 0 10/01/2016   BASOPCT 1.8 08/26/2016    CMP: Lab Results  Component Value Date   NA 137 10/05/2016   NA 136 08/26/2016   K 4.2 10/05/2016   K 5.4 (H) 08/26/2016   CL 106 10/05/2016   CO2 19 (L) 10/05/2016   CO2 19 (L) 08/26/2016   BUN 21 (H) 10/05/2016   BUN 69.2 (H) 08/26/2016   CREATININE 2.01 (H) 10/05/2016   CREATININE 2.3 (H) 08/26/2016   PROT 5.7 (L) 10/03/2016   PROT 7.1 08/26/2016   ALBUMIN 2.7 (L) 10/03/2016   ALBUMIN 3.1 (L) 08/26/2016   BILITOT 0.7 10/03/2016   BILITOT 0.37 08/26/2016   ALKPHOS 78 10/03/2016   ALKPHOS 79 08/26/2016   AST 21 10/03/2016   AST 58 (H) 08/26/2016   ALT 11 (L) 10/03/2016   ALT 32 08/26/2016  .  Total Time in preparing paper work, data evaluation and todays exam - 5 minutes  Lala Lund M.D on 10/05/2016 at 9:36 AM  [facility]   Office  [phone]

## 2016-10-06 NOTE — Addendum Note (Signed)
Addendum  created 10/06/16 1407 by Myrtie Soman, MD   Sign clinical note

## 2016-10-06 NOTE — Anesthesia Postprocedure Evaluation (Signed)
Anesthesia Post Note  Patient: Erica Jimenez  Procedure(s) Performed: Procedure(s) (LRB): ESOPHAGOGASTRODUODENOSCOPY (EGD) (N/A)     Anesthesia Post Evaluation  Last Vitals:  Vitals:   09/08/16 0450 09/08/16 1438  BP: (!) 154/65 138/64  Pulse: 65 64  Resp: 16   Temp: 37.1 C 36.7 C    Last Pain:  Vitals:   09/08/16 1438  TempSrc: Oral  PainSc:                  Husna Krone S

## 2016-10-07 ENCOUNTER — Encounter: Payer: Self-pay | Admitting: Internal Medicine

## 2016-10-07 ENCOUNTER — Non-Acute Institutional Stay (SKILLED_NURSING_FACILITY): Payer: Medicare Other | Admitting: Internal Medicine

## 2016-10-07 DIAGNOSIS — I639 Cerebral infarction, unspecified: Secondary | ICD-10-CM

## 2016-10-07 DIAGNOSIS — I82409 Acute embolism and thrombosis of unspecified deep veins of unspecified lower extremity: Secondary | ICD-10-CM | POA: Diagnosis not present

## 2016-10-07 DIAGNOSIS — E785 Hyperlipidemia, unspecified: Secondary | ICD-10-CM | POA: Diagnosis not present

## 2016-10-07 DIAGNOSIS — R531 Weakness: Secondary | ICD-10-CM | POA: Diagnosis not present

## 2016-10-07 DIAGNOSIS — E1169 Type 2 diabetes mellitus with other specified complication: Secondary | ICD-10-CM | POA: Diagnosis not present

## 2016-10-07 DIAGNOSIS — O2230 Deep phlebothrombosis in pregnancy, unspecified trimester: Secondary | ICD-10-CM | POA: Insufficient documentation

## 2016-10-07 DIAGNOSIS — K922 Gastrointestinal hemorrhage, unspecified: Secondary | ICD-10-CM | POA: Diagnosis not present

## 2016-10-07 DIAGNOSIS — C3491 Malignant neoplasm of unspecified part of right bronchus or lung: Secondary | ICD-10-CM | POA: Diagnosis not present

## 2016-10-07 NOTE — Assessment & Plan Note (Signed)
Sliding scale insulin not realistic option in the SNF especially with her history of hypoglycemia Hypoglycemic episodes would only exacerbate her CNS issues Sliding scale will be used only for hyperglycemia after the evening meal

## 2016-10-07 NOTE — Assessment & Plan Note (Signed)
PT/OT at SNF °

## 2016-10-07 NOTE — Assessment & Plan Note (Signed)
Continue Plavix

## 2016-10-07 NOTE — Assessment & Plan Note (Signed)
Dr. Earlie Server discontinued chemotherapy, Hospice upon return home

## 2016-10-07 NOTE — Patient Instructions (Signed)
See assessment and plan under each diagnosis in the problem list and acutely for this visit 

## 2016-10-07 NOTE — Progress Notes (Signed)
FACILITY LOCATION:  [facility] ROOM NUMBER:  308-A  CODE STATUS:    PCP:  [name] (Inactive)    This is a comprehensive admission note to [facility] performed on this date less than 30 days from date of admission. Included are preadmission medical/surgical history;reconciled medication list; family history; social history and comprehensive review of systems.  Corrections and additions to the records were documented . Comprehensive physical exam was also performed. Additionally a clinical summary was entered for each active diagnosis pertinent to this admission in the Problem List to enhance continuity of care.  HPI: The patient was hospitalized 5/30-10/05/16 for acute small left frontal lobe nonhemorrhagic infarct and subacute subcentimeter left temporal lobe infarct. This is in the context of history of atrial fibrillation, patient was not on anticoagulation due to risk of GI bleeding and recurrent falls. Patient had actually had a major GI bleed the month prior to admission requiring 4 units of packed red cells. Additionally the patient has lung cancer and was on hospice at home. Neurology recommended Plavix and statin. The deficits of right arm weakness and gait abnormality improved, but SNF was recommended for PT/OT. The patient exhibited a non-ACS pattern with non progressive rise in troponin; no invasive procedure was indicated because of the comorbidities listed above. The patient did have a DVT in the left lower extremity calf. IVC filter was planned if the lesion progresses on Doppler in 3-4 weeks and patient's overall status warrants this invasive procedure.Marland Kitchen Discharge creatinine was 2.01, BUN 21, GFR 25, glucose 233, hemoglobin 10.6 and hematocrit 34.2. A1c was 7.6  percent on 5/31.   Past medical and surgical history:Dr. Mohammed, her oncologist for her stage III non-small cell lung cancer discontinued chemotherapy several weeks prior to admission & Hospice  consulted.  GI bleed had resolved, patient is on bid PPI. The patient does have paroxysmal atrial fibrillation with a CHADS VASC 2 score of at least 6. As noted she's not a candidate for anticoagulation because of the potential of another catastrophic GI bleed.  Patient is on basal and sliding scale insulin. Other diagnoses include dyslipidemia for which statin was maximized. The patient had an MI in 1996 with stenting of LAD.  Synthroid was continued for hypothyroidism. The patient has chronic systolic and diastolic congestive heart failure with ejection fraction of 30-35 percent ; but renal insufficiency precludes the use of ACE-I or ARB. 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  Social history: Patient discontinued drinking & smoking in February of this year. Minimal social drinker" on Saturday night" previously.  Family history:Reviewed  Review of systems: She has intermittent "cold sensation" inside the right lateral crown, this is more prominent on the right but can appear on the left. It began prior to her stroke. It may be absent for days. It typically can last up to a few minutes. It responds to Tylenol. She describes taking her sugar twice a day at home. She states that she has had hypoglycemia with glucoses as low as 50. With that she "feels bad" and has diaphoresis. She has residual weakness of the right hand. She is unable to feed herself as she is right-handed. She has some exertional dyspnea. She also describes anorexia and poor appetite without other active GI symptoms.  Constitutional: No fever,significant weight change, fatigue  Eyes: No redness, discharge, pain, vision change ENT/mouth: No nasal congestion,  purulent discharge, earache,change in hearing ,sore throat  Cardiovascular: No chest pain, palpitations,paroxysmal  nocturnal dyspnea, claudication, edema  Respiratory: No cough, sputum production,hemoptysis,  significant snoring,apnea   Gastrointestinal: No heartburn,dysphagia,abdominal pain, nausea / vomiting,rectal bleeding, melena,change in bowels Genitourinary: No dysuria,hematuria, pyuria,  incontinence, nocturia Musculoskeletal: No joint stiffness, joint swelling,pain Dermatologic: No rash, pruritus, change in appearance of skin Neurologic: No dizziness,syncope, seizures, numbness , tingling Psychiatric: No significant anxiety , depression, insomnia Endocrine: No change in hair/skin/ nails, excessive thirst, excessive hunger, excessive urination  Hematologic/lymphatic: No significant bruising, lymphadenopathy,abnormal bleeding Allergy/immunology: No itchy/ watery eyes, significant sneezing, urticaria, angioedema  Physical exam:  Pertinent or positive findings: Arcus senilis is present superiorly around the cornea. She has complete dentures. The right nasolabial fold is decreased. Eyebrows are absent. A grade 1/2 systolic murmur present at the base. Second heart sound is increased. Faint R carotid rumble present. She has diffuse, homogenous dry rales. There is some expiratory wheeze on the right. Pedal pulses are decreased, especially dorsalis pedis pulses. She has clubbing of the nailbeds. Strength is symmetrically decreased in lower extremities. She is weakest in the right upper extremity.  General appearance:Adequately nourished; no acute distress , increased work of breathing is present.   Lymphatic: No lymphadenopathy about the head, neck, axilla . Eyes: No conjunctival inflammation or lid edema is present. There is no scleral icterus. Ears:  External ear exam shows no significant lesions or deformities.   Nose:  External nasal examination shows no deformity or inflammation. Nasal mucosa are pink and moist without lesions ,exudates Oral exam: lips and gums are healthy appearing.There is no oropharyngeal erythema or exudate . Neck:  No thyromegaly, masses, tenderness noted.    Heart:  Normal rate and regular rhythm. S1  normal without gallop, click, rub .  Abdomen:Bowel sounds are normal. Abdomen is soft and nontender with no organomegaly, hernias,masses. GU: deferred  Extremities:  No cyanosis, edema  Neurologic exam : Oriented X3; very articulate & communicative . Balance,Rhomberg,finger to nose testing could not be completed due to clinical state Deep tendon reflexes are equal Skin: Warm & dry w/o tenting. No significant lesions or rash.  See clinical summary under each active problem in the Problem List with associated updated therapeutic plan

## 2016-10-07 NOTE — Assessment & Plan Note (Signed)
Continue Plavix with bid PPI coverage

## 2016-10-07 NOTE — Assessment & Plan Note (Signed)
Consider repeat of venous Doppler in July depending on clinical state

## 2016-10-07 NOTE — Assessment & Plan Note (Deleted)
PPI twice a day

## 2016-10-09 ENCOUNTER — Other Ambulatory Visit: Payer: Medicare Other

## 2016-10-14 ENCOUNTER — Encounter: Payer: Self-pay | Admitting: Internal Medicine

## 2016-10-14 ENCOUNTER — Other Ambulatory Visit: Payer: Self-pay | Admitting: *Deleted

## 2016-10-14 ENCOUNTER — Non-Acute Institutional Stay (SKILLED_NURSING_FACILITY): Payer: Medicare Other | Admitting: Internal Medicine

## 2016-10-14 DIAGNOSIS — E1169 Type 2 diabetes mellitus with other specified complication: Secondary | ICD-10-CM

## 2016-10-14 DIAGNOSIS — J310 Chronic rhinitis: Secondary | ICD-10-CM | POA: Diagnosis not present

## 2016-10-14 DIAGNOSIS — E785 Hyperlipidemia, unspecified: Secondary | ICD-10-CM | POA: Diagnosis not present

## 2016-10-14 DIAGNOSIS — M5412 Radiculopathy, cervical region: Secondary | ICD-10-CM

## 2016-10-14 NOTE — Assessment & Plan Note (Signed)
10/14/16 marked insulin sensitivity in the context of poor oral intake Only PC lunch glucoses will be covered, 2 units if glucose greater than 150 and 4 units if greater than 300

## 2016-10-14 NOTE — Progress Notes (Signed)
NURSING HOME LOCATION:  [facility] ROOM NUMBER:  308-A  CODE STATUS:  DNR  PCP:  [name] (Inactive) [place]  This is a nursing facility follow up for specific acute issue of hypoglycemia.  Interim medical record and care since last [place] visit was updated with review of diagnostic studies and change in clinical status since last visit were documented.  HPI: 8 PM glucose was reported as 267, after 10 units of Humalog glucose was  52 at 11 PM associated with mental status changes and diaphoresis. This responded oral dietary supplements. By history the patient has been eating poorly or not at all pror to admission. She's been eating slightly better @ the SNF, with the largest meal is lunch. Typically this will consist of a salad and some fruit dessert. Her anorexia however is only slightly better. At home she was on 12 units of Lantus as well as 2-4 units of sliding scale before meals. If the glucose were over 150 she received 2 units and 4 if over 300. She did have hypoglycemia at home. She denies any active symptoms related to diabetes.  Last night she had pain in the right triceps area lasting 15 minutes while supine in bed. This resolve with medication. She's had no exertional chest pain and no left upper extremity discomfort. Also she has seasonal rhinitis which has now become persistant.  She does have occasional exertional dyspnea. She has slight constipation which responds to medications.  She describes intermittent tremor of the left hand. The right hand strength has improved. She asked about the status of her lung cancer. Dr. Earlie Server apparently has discontinued all chemotherapy or radiation as she is on hospice. She will return to hospice care at discharge from SNF. I recommended she discuss this question with hospice.   Review of systems: Constitutional: No fever,significant weight change  Eyes: No redness, discharge, pain, vision change ENT/mouth: No nasal  congestion,  purulent discharge, earache,change in hearing ,sore throat  Cardiovascular: No chest pain, palpitations,paroxysmal nocturnal dyspnea, claudication, edema  Respiratory: No cough, sputum production,hemoptysis,  significant snoring,apnea  Gastrointestinal: No heartburn,dysphagia,abdominal pain, nausea / vomiting,rectal bleeding, melena Genitourinary: No dysuria,hematuria, pyuria,  incontinence, nocturia Musculoskeletal: No joint stiffness, joint swelling Dermatologic: No rash, pruritus, change in appearance of skin Neurologic: No dizziness,headache,syncope, seizures, numbness , tingling Endocrine: No change in hair/skin/ nails, excessive thirst, excessive hunger, excessive urination  Hematologic/lymphatic: No significant bruising, lymphadenopathy,abnormal bleeding Allergy/immunology: No itchy/ watery eyes, significant sneezing, urticaria, angioedema  Physical exam:  Pertinent or positive findings: she is slightly hoarse. Clear nasal secretions are visible on the left. Lids are puffy. She has complete dentures. First heart sound is split. Rhythm is slightly irregular. Pedal pulses are decreased. She has clubbing. She's generally weak without any definite asymmetry.  General appearance: no acute distress , increased work of breathing is present.   Lymphatic: No lymphadenopathy about the head, neck, axilla . Eyes: No conjunctival inflammation or lid edema is present. There is no scleral icterus. Ears:  External ear exam shows no significant lesions or deformities.   Nose:  External nasal examination shows no deformity or inflammation. Nasal mucosa are pink and moist without lesions ,exudates Oral exam: lips and gums are healthy appearing.There is no oropharyngeal erythema or exudate . Neck:  No thyromegaly, masses, tenderness noted.    Heart:  No gallop, murmur, click, rub .  Lungs: without wheezes, rhonchi,rales , rubs. Abdomen:Bowel sounds are normal. Abdomen is soft and nontender with  no organomegaly, hernias,masses. GU:  deferred  Extremities:  No cyanosis, edema  Skin: Warm & dry w/o tenting. No significant lesions or rash.  #1 hypoglycemia context of marked sensitivity to low dose insulin & poor oral intake. Only lunch will be covered and at the home SSI scale to prevent hypoglycemia.  #2 positional right triceps pain suggesting cervical radiculopathy If this is persistent or progressive, Neurontin would be Rxed. #3 chronic rhinitis. Intranasal steroids will be initiated.

## 2016-10-14 NOTE — Patient Instructions (Signed)
See assessment and plan under each diagnosis in the problem list and acutely for this visit 

## 2016-10-14 NOTE — Patient Outreach (Signed)
[facility]) Care Management  10/14/2016  Erica Jimenez 02-Feb-1930 [id number]   Met with patient at facility to screen for [facility] care management services.  Patient reports she has lung cancer and took one chemo treatment and she states she was so sick from it, she had a conversation with family and decided to not pursue further cancer treatment. She states she has been under Hospice services. She went to the hospital, revoked hospice to get rehab and some strength to spend her last bit of time in the best shape she can be in.   Patient reports she is able to work with therapy but still has good days and bad days.  She plans to return to hospice services when she discharges from [facility] facility.   RNCM reviewed [facility] program and left information for future needs, explained that we do not interfere with Hospice and that is she had hospice she would not need [facility] as they would provide care. Patient verbalized understanding and appreciates the information for "just in case".  Spoke with Cameron Ali, SW at facility, she confirms that patient has said her intention is to return to hospice services upon discharge.   Plan to sign off at this time. [name]. [name], RN, BSN, [place] [phone]) Business Cell  [phone]) Toll Free Office

## 2016-10-16 ENCOUNTER — Other Ambulatory Visit: Payer: Medicare Other

## 2016-10-20 ENCOUNTER — Encounter: Payer: Self-pay | Admitting: Adult Health

## 2016-10-20 ENCOUNTER — Non-Acute Institutional Stay (SKILLED_NURSING_FACILITY): Payer: Medicare Other | Admitting: Adult Health

## 2016-10-20 DIAGNOSIS — R519 Headache, unspecified: Secondary | ICD-10-CM

## 2016-10-20 DIAGNOSIS — Z8673 Personal history of transient ischemic attack (TIA), and cerebral infarction without residual deficits: Secondary | ICD-10-CM

## 2016-10-20 DIAGNOSIS — R51 Headache: Secondary | ICD-10-CM | POA: Diagnosis not present

## 2016-10-20 DIAGNOSIS — C3491 Malignant neoplasm of unspecified part of right bronchus or lung: Secondary | ICD-10-CM

## 2016-10-20 DIAGNOSIS — I82402 Acute embolism and thrombosis of unspecified deep veins of left lower extremity: Secondary | ICD-10-CM | POA: Diagnosis not present

## 2016-10-20 NOTE — Progress Notes (Signed)
DATE:  10/20/2016 MRN:  [id number]  BIRTHDAY: 25-Oct-1929  Facility:  Nursing Home Location:  [facility] Room Number: 151-V  LEVEL OF CARE:  SNF (31)  Contact Information    Name Relation Home Work Mobile   Davis,Peggy Daughter [phone]  [phone]       Code Status History    Date Active Date Inactive Code Status Order ID Comments User Context   10/01/2016  6:56 PM 10/05/2016  6:55 PM DNR [id number]  Hosie Poisson, MD Inpatient   09/05/2016  8:55 AM 09/08/2016  7:58 PM DNR [id number]  Doreatha Lew, MD Inpatient   09/01/2016  4:38 PM 09/05/2016  8:55 AM Full Code [id number]  Robbie Lis, MD Inpatient   08/16/2016 10:50 PM 08/18/2016  5:43 PM DNR [id number]  Toy Baker, MD Inpatient   08/17/2013  5:52 PM 08/17/2013 11:58 PM Full Code [id number]  Wellington Hampshire, MD Inpatient    Questions for Most Recent Historical Code Status (Order [id number])    Question Answer Comment   In the event of cardiac or respiratory ARREST Do not call a "code blue"    In the event of cardiac or respiratory ARREST Do not perform Intubation, CPR, defibrillation or ACLS    In the event of cardiac or respiratory ARREST Use medication by any route, position, wound care, and other measures to relive pain and suffering. May use oxygen, suction and manual treatment of airway obstruction as needed for comfort.         Advance Directive Documentation     Most Recent Value  Type of Advance Directive  Out of facility DNR (pink MOST or yellow form)  Pre-existing out of facility DNR order (yellow form or pink MOST form)  -  "MOST" Form in Place?  -       Chief Complaint  Patient presents with  . Acute Visit    Headache     HISTORY OF PRESENT ILLNESS:  This is an 83 year old female who is being seen for an acute visit for her headache. She has been admitted to [facility] on 10/05/16 from the hospital admission dates 10/01/16 thru 10/05/16 with  acute small left frontal lobe nonhemorrhagic infarct and subacute subcentimeter left temporal lobe infarct. She has atrial fibrillation and is not on any anticoagulation due to history of GI bleed and recurrent falls. She has a history of GI bleed requiring 4 units of packed RBC. She has lung cancer and was on hospice at home.  She has a history of DVT in the  LLE calf. The plan is to have IVC filter placement if DVT progresses on doppler in 3-4 weeks. Neurology recommended Plavix and statin. She has been admitted to [facility] for short-term rehabilitation.   She is seen in her room today and complained of headache that comes and go whenever she is lying down. She said that her headache is sharp and does not last long. She verbalized not having migraines. She requested to see Dr. Earlie Server, oncologist, even though she does not want to have chemotherapy but wants to know her other options.    PAST MEDICAL HISTORY:  Past Medical History:  Diagnosis Date  . A-V fistula ([place])    pt has 2  fistulas in rt arm  . Alopecia   . Anemia   . Anxiety   . Aortic stenosis   . Arthritis    gout  . Bladder cancer ([place])  s/p resection and BCG treatment.   Marland Kitchen CAD (coronary artery disease)     Pt is s/p anterior MI in 1996 with stent placed in LAD.  Last myoview in our office was in 7/05 and  showed apical anterior, apical, and apical inferior infarct.  Minimal peri-infarct ischemia.  EF 49%.   . Cardiomyopathy, ischemic   . CKD (chronic kidney disease)     Pt is stage III-IV.  She had a fistula placed but is not on dialysis.  She follows with Dr. Lorrene Reid for  nephrology.   Marland Kitchen COPD (chronic obstructive pulmonary disease) ([place])   . CVA (cerebral vascular accident) ([place])   . DM (diabetes mellitus) ([place])   . Full dentures   . Gout   . History of glaucoma   . HOH (hard of hearing)    slightly  . HTN (hypertension)   . Hyperlipidemia   . Hypothyroidism   . Myocardial infarction  ([place])   . PAD (peripheral artery disease) ([facility])     arterial doppler study 12/04 suggestive of > 50% bilateral SFA stenosis.  Pt has mild claudication.  No invasive evaluation given CKD and desire to avoid contrast use.   Marland Kitchen Shortness of breath dyspnea    on exertion  . Wears glasses      CURRENT MEDICATIONS: Reviewed  Patient's Medications  New Prescriptions   No medications on file  Previous Medications   ACETAMINOPHEN (TYLENOL) 325 MG TABLET    Take 650 mg by mouth every 6 (six) hours as needed.   ALLOPURINOL (ZYLOPRIM) 100 MG TABLET    Take 100 mg by mouth daily.   ATORVASTATIN (LIPITOR) 80 MG TABLET    Take 80 mg by mouth daily after supper.    BISOPROLOL (ZEBETA) 5 MG TABLET    take 1/2 tablet by mouth once daily **PLEASE KEEP UPCOMING APPOINTMENT FOR FURTHER REFILLS**   BRINZOLAMIDE (AZOPT) 1 % OPHTHALMIC SUSPENSION    Place 1 drop into both eyes 2 (two) times daily.    CARBOXYMETHYLCELLULOSE SODIUM (REFRESH TEARS OP)    Place 1 drop into both eyes 3 (three) times daily as needed (dry eyes). Both eyes.   CITALOPRAM (CELEXA) 10 MG TABLET    Take 2 tablets (20 mg total) by mouth daily after supper.   CLOPIDOGREL (PLAVIX) 75 MG TABLET    Take 75 mg by mouth daily.   FERROUS SULFATE 325 (65 FE) MG TABLET    Take 325 mg by mouth daily. After supper   FOLIC ACID (FOLVITE) 1 MG TABLET    Take 1 mg by mouth daily.   FUROSEMIDE (LASIX) 40 MG TABLET    Take 40 mg by mouth daily.    GABAPENTIN (NEURONTIN) 100 MG CAPSULE    Take 100 mg by mouth 3 (three) times daily.   HYDRALAZINE (APRESOLINE) 50 MG TABLET    Take 1 tablet (50 mg total) by mouth 2 (two) times daily.   INSULIN GLARGINE (LANTUS) 100 UNIT/ML SOPN    Inject 0.1 mLs (10 Units total) into the skin daily before breakfast.   INSULIN LISPRO (HUMALOG) 100 UNIT/ML INJECTION    Inject 2-4 Units into the skin daily. Cover only post meal glucose at lunch. <150 no insulin, 151-299 = 2 units, >300 = 4 units   ISOSORBIDE MONONITRATE (IMDUR)  60 MG 24 HR TABLET    Take 60 mg by mouth daily after supper.    LATANOPROST (XALATAN) 0.005 % OPHTHALMIC SOLUTION    Place 1 drop into both  eyes at bedtime.    LEVOTHYROXINE (SYNTHROID, LEVOTHROID) 50 MCG TABLET    Take 50 mcg by mouth daily after supper.    LIDOCAINE-PRILOCAINE (EMLA) CREAM    Apply 1 application topically as needed.   LORAZEPAM (ATIVAN) 0.5 MG TABLET    Take 0.5 mg by mouth every 4 (four) hours as needed for anxiety (short of breath). x14 days   NITROGLYCERIN (NITROSTAT) 0.4 MG SL TABLET    Place 1 tablet (0.4 mg total) under the tongue every 5 (five) minutes as needed for chest pain.   NUTRITIONAL SUPPLEMENT LIQD    Take 120 mLs by mouth 3 (three) times daily. [employer]   PANTOPRAZOLE (PROTONIX) 40 MG TABLET    Take 1 tablet (40 mg total) by mouth 2 (two) times daily.   POTASSIUM CHLORIDE SA (K-DUR,KLOR-CON) 20 MEQ TABLET    Take 20 mEq by mouth 2 (two) times daily.   TRIAMCINOLONE (NASACORT) 55 MCG/ACT AERO NASAL INHALER    Place 1 spray into the nose 2 (two) times daily. Aim toward ear, not straight up nose  Modified Medications   No medications on file  Discontinued Medications   FEBUXOSTAT (ULORIC) 40 MG TABLET    Take 40 mg by mouth daily.   INSULIN LISPRO (HUMALOG KWIKPEN) 100 UNIT/ML KIWKPEN    Before each meal 3 times a day, 140-199 - 2 units, 200-250 - 4 units, 251-299 - 6 units,  300-349 - 8 units,  350 or above 10 units. Insulin PEN if approved, provide syringes and needles if needed.     Allergies  Allergen Reactions  . Contrast Media [Iodinated Diagnostic Agents] Other (See Comments)    Renal insufficiency  . Fluoxetine Rash  . Sulfonamide Derivatives Nausea Only     REVIEW OF SYSTEMS:  GENERAL: no change in appetite, no fatigue, no weight changes, no fever, chills or weakness EYES: Denies change in vision, dry eyes, eye pain, itching or discharge EARS: Denies change in hearing, ringing in ears, or earache NOSE: Denies nasal congestion or  epistaxis MOUTH and THROAT: Denies oral discomfort, gingival pain or bleeding, pain from teeth or hoarseness   RESPIRATORY: no cough, SOB, DOE, wheezing, hemoptysis CARDIAC: no chest pain, edema or palpitations GI: no abdominal pain, diarrhea, constipation, heart burn, nausea or vomiting GU: Denies dysuria, frequency, hematuria, incontinence, or discharge NEUROLOGICAL: + headache PSYCHIATRIC: Denies feeling of depression or anxiety. No report of hallucinations, insomnia, paranoia, or agitation     PHYSICAL EXAMINATION  GENERAL APPEARANCE: Well nourished. In no acute distress. Normal body habitus SKIN:  Skin is warm and dry.  HEAD: Normal in size and contour. No evidence of trauma EYES: Lids open and close normally. No blepharitis, entropion or ectropion. PERRL. Conjunctivae are clear and sclerae are white. Lenses are without opacity EARS: Pinnae are normal. Patient hears normal voice tunes of the examiner MOUTH and THROAT: Lips are without lesions. Oral mucosa is moist and without lesions. Tongue is normal in shape, size, and color and without lesions NECK: supple, trachea midline, no neck masses, no thyroid tenderness, no thyromegaly LYMPHATICS: no LAN in the neck, no supraclavicular LAN RESPIRATORY: breathing is even & unlabored, BS CTAB CARDIAC: RRR, no murmur,no extra heart sounds, no edema GI: abdomen soft, normal BS, no masses, no tenderness, no hepatomegaly, no splenomegaly EXTREMITIES:  Able to move X 4 extremities NEUROLOGICAL: There is no tremor. Speech is clear PSYCHIATRIC: Alert and oriented X 3. Affect and behavior are appropriate   LABS/RADIOLOGY: Labs reviewed: Basic Metabolic Panel:  Recent Labs  09/06/16 0501  10/01/16 1602 10/01/16 1610 10/03/16 0816 10/05/16 0814  NA 146*  < > 137 139 139 137  K 4.2  < > 5.0 4.9 3.9 4.2  CL 125*  < > 107 105 111 106  CO2 17*  < > 20*  --  21* 19*  GLUCOSE 77  < > 240* 231* 95 233*  BUN 28*  < > 22* 25* 16 21*   CREATININE 2.24*  < > 1.81* 1.70* 1.54* 2.01*  CALCIUM 7.8*  < > 9.5  --  9.0 9.4  MG 1.8  --   --   --   --  1.6*  < > = values in this interval not displayed. Liver Function Tests:  Recent Labs  09/01/16 1308 09/04/16 0416 10/01/16 1602 10/03/16 0816  AST 15  --  25 21  ALT 11*  --  14 11*  ALKPHOS 105  --  94 78  BILITOT 0.2*  --  0.7 0.7  PROT 6.0*  --  6.7 5.7*  ALBUMIN 2.9* 2.2* 3.3* 2.7*    Recent Labs  09/01/16 1308  LIPASE 22   CBC:  Recent Labs  09/01/16 1308  09/07/16 0510  09/08/16 1020 10/01/16 1602 10/01/16 1610 10/03/16 0816  WBC 27.0*  < > 13.7*  --  13.3* 7.7  --  5.0  NEUTROABS 21.3*  --  11.1*  --   --  4.9  --   --   HGB 10.3*  < > 7.9*  < > 9.5* 12.1 13.6 10.6*  HCT 31.8*  < > 25.5*  < > 29.8* 38.6 40.0 34.2*  MCV 90.1  < > 98.1  --  93.7 96.0  --  95.3  PLT 152  < > 102*  --  128* 179  --  180  < > = values in this interval not displayed.  Lipid Panel:  Recent Labs  08/17/16 0440 10/02/16 0249  HDL 42 53   Cardiac Enzymes:  Recent Labs  10/01/16 2235 10/02/16 0249 10/02/16 0746  TROPONINI 0.25* 0.26* 0.26*   CBG:  Recent Labs  10/04/16 2108 10/05/16 0616 10/05/16 1131  GLUCAP 124* 138* 121*      Dg Chest 2 View  Result Date: 10/01/2016 CLINICAL DATA:  Acute onset of right upper extremity weakness. Initial encounter. EXAM: CHEST  2 VIEW COMPARISON:  Chest radiograph from 09/04/2016 FINDINGS: The lungs are well-aerated. Minimal residual right basilar airspace opacity may reflect mild residual pneumonia. Mild scarring is noted at the right lung apex. There is no evidence of pleural effusion or pneumothorax. The heart is mildly enlarged. No acute osseous abnormalities are seen. IMPRESSION: 1. Minimal residual right basilar airspace opacity may reflect mild residual pneumonia, improved from the prior study. 2. Mild scarring at the right lung apex. 3. Mild cardiomegaly. Electronically Signed   By: Garald Balding M.D.   On:  10/01/2016 22:34   [name] Contrast  Result Date: 10/01/2016 CLINICAL DATA:  RIGHT arm weakness and gait abnormality at noon today. History of hypertension, stroke, diabetes, lung cancer. EXAM: MRI HEAD WITHOUT CONTRAST MRA HEAD WITHOUT CONTRAST TECHNIQUE: Multiplanar, multiecho pulse sequences of the brain and surrounding structures were obtained without intravenous contrast. Angiographic images of the head were obtained using MRA technique without contrast. COMPARISON:  CT HEAD Oct 01, 2016 at 1612 hours and MRI head August 16, 2016 and MRA head August 17, 2016 FINDINGS: MRI HEAD FINDINGS BRAIN: Curvilinear reduced diffusion LEFT posterior frontal  lobe precentral gyrus extending to centrum semiovale, with low ADC values. Residual subcentimeter reduced diffusion LEFT temporal lobe, persistent low ADC values. No susceptibility artifact to suggest hemorrhage. No midline shift, mass effect or masses. Old RIGHT occipital lobe, biparietal, LEFT frontal lobe encephalomalacia. Ex vacuo dilatation of subjacent ventricles. No hydrocephalus. Prominent LEFT greater than RIGHT thalami perivascular spaces associated with chronic hypertension. Old small bilateral cerebellar infarcts. No abnormal extra-axial fluid collections. VASCULAR: Normal major intracranial vascular flow voids present at skull base. SKULL AND UPPER CERVICAL SPINE: No abnormal sellar expansion. No suspicious calvarial bone marrow signal. Craniocervical junction maintained. SINUSES/ORBITS: Trace paranasal sinus mucosal thickening. The included ocular globes and orbital contents are non-suspicious. Status post bilateral ocular lens implants. OTHER: Patient is edentulous. MRA HEAD FINDINGS ANTERIOR CIRCULATION: Flow related enhancement within bilateral internal carotid artery's; luminal irregularities of the carotid siphons with mild tandem stenosis due to calcific atherosclerosis as seen on today's head CT. RIGHT A1 segment is dominant, patent anterior  communicating with normal flow related enhancement bilateral anterior cerebral artery's. Normal flow related enhancement bilateral middle cerebral artery's. No large vessel occlusion, high-grade stenosis, abnormal luminal irregularity, aneurysm. POSTERIOR CIRCULATION: LEFT vertebral artery is dominant. Basilar artery is patent, with normal flow related enhancement of the main branch vessels. Normal flow related enhancement of the posterior cerebral arteries. No large vessel occlusion, high-grade stenosis, abnormal luminal irregularity, aneurysm. ANATOMIC VARIANTS: None. Source images and MIP images were reviewed. IMPRESSION: MRI HEAD: Acute small LEFT frontal lobe nonhemorrhagic infarct. Subacute subcentimeter LEFT temporal lobe infarct though, considering ADC values, acute component is possible. Old biparietal and LEFT frontal lobe infarcts (MCA territories), old bioccipital lobe infarcts (PCA territories). Old small cerebellar infarcts. MRA HEAD: No emergent large vessel occlusion or severe stenosis. Atherosclerosis resulting in mild stenosis bilateral carotid siphons ICAs. Electronically Signed   By: Elon Alas M.D.   On: 10/01/2016 22:34   Mr Jodene Nam Head/brain VE Cm  Result Date: 10/01/2016 CLINICAL DATA:  RIGHT arm weakness and gait abnormality at noon today. History of hypertension, stroke, diabetes, lung cancer. EXAM: MRI HEAD WITHOUT CONTRAST MRA HEAD WITHOUT CONTRAST TECHNIQUE: Multiplanar, multiecho pulse sequences of the brain and surrounding structures were obtained without intravenous contrast. Angiographic images of the head were obtained using MRA technique without contrast. COMPARISON:  CT HEAD Oct 01, 2016 at 1612 hours and MRI head August 16, 2016 and MRA head August 17, 2016 FINDINGS: MRI HEAD FINDINGS BRAIN: Curvilinear reduced diffusion LEFT posterior frontal lobe precentral gyrus extending to centrum semiovale, with low ADC values. Residual subcentimeter reduced diffusion LEFT temporal  lobe, persistent low ADC values. No susceptibility artifact to suggest hemorrhage. No midline shift, mass effect or masses. Old RIGHT occipital lobe, biparietal, LEFT frontal lobe encephalomalacia. Ex vacuo dilatation of subjacent ventricles. No hydrocephalus. Prominent LEFT greater than RIGHT thalami perivascular spaces associated with chronic hypertension. Old small bilateral cerebellar infarcts. No abnormal extra-axial fluid collections. VASCULAR: Normal major intracranial vascular flow voids present at skull base. SKULL AND UPPER CERVICAL SPINE: No abnormal sellar expansion. No suspicious calvarial bone marrow signal. Craniocervical junction maintained. SINUSES/ORBITS: Trace paranasal sinus mucosal thickening. The included ocular globes and orbital contents are non-suspicious. Status post bilateral ocular lens implants. OTHER: Patient is edentulous. MRA HEAD FINDINGS ANTERIOR CIRCULATION: Flow related enhancement within bilateral internal carotid artery's; luminal irregularities of the carotid siphons with mild tandem stenosis due to calcific atherosclerosis as seen on today's head CT. RIGHT A1 segment is dominant, patent anterior communicating with normal flow related enhancement bilateral anterior  cerebral artery's. Normal flow related enhancement bilateral middle cerebral artery's. No large vessel occlusion, high-grade stenosis, abnormal luminal irregularity, aneurysm. POSTERIOR CIRCULATION: LEFT vertebral artery is dominant. Basilar artery is patent, with normal flow related enhancement of the main branch vessels. Normal flow related enhancement of the posterior cerebral arteries. No large vessel occlusion, high-grade stenosis, abnormal luminal irregularity, aneurysm. ANATOMIC VARIANTS: None. Source images and MIP images were reviewed. IMPRESSION: MRI HEAD: Acute small LEFT frontal lobe nonhemorrhagic infarct. Subacute subcentimeter LEFT temporal lobe infarct though, considering ADC values, acute component  is possible. Old biparietal and LEFT frontal lobe infarcts (MCA territories), old bioccipital lobe infarcts (PCA territories). Old small cerebellar infarcts. MRA HEAD: No emergent large vessel occlusion or severe stenosis. Atherosclerosis resulting in mild stenosis bilateral carotid siphons ICAs. Electronically Signed   By: Elon Alas M.D.   On: 10/01/2016 22:34   Ct Head Code Stroke W/o Cm  Result Date: 10/01/2016 CLINICAL DATA:  Code stroke. 83 year old female with right side weakness and abnormal gait. Last seen normal 1230 hours. EXAM: CT HEAD WITHOUT CONTRAST TECHNIQUE: Contiguous axial images were obtained from the base of the skull through the vertex without intravenous contrast. COMPARISON:  Head CT 05/11/2010. Brain MRI and intracranial MRA 08/16/2016 FINDINGS: Brain: Scattered chronic cortical encephalomalacia in the bilateral posterior MCA, anterior left MCA, and right PCA territories. Small chronic infarcts in the left cerebellar hemisphere. No cortically based acute infarct identified. No acute intracranial hemorrhage identified. No midline shift, mass effect, or evidence of intracranial mass lesion. Stable ventricle size and configuration. Vascular: Extensive Calcified atherosclerosis at the skull base. No suspicious intracranial vascular hyperdensity. Skull: No acute osseous abnormality identified. Hyperostosis of the calvarium, normal variant. Sinuses/Orbits: Visualized paranasal sinuses and mastoids are stable and well pneumatized. Other: No acute orbit or scalp soft tissue findings. ASPECTS [facility] Stroke Program Early CT Score) - Ganglionic level infarction (caudate, lentiform nuclei, internal capsule, insula, M1-M3 cortex): 7 - Supraganglionic infarction (M4-M6 cortex): 3 Total score (0-10 with 10 being normal): 10 IMPRESSION: 1. Bilateral chronic ischemic disease appears stable since 08/16/2016. No acute cortically based infarct or acute intracranial hemorrhage identified. 2. ASPECTS  is 10. 3. The above was relayed via text pager to E. Lindzen on 10/01/2016 at 16:19 . Electronically Signed   By: Genevie Ann M.D.   On: 10/01/2016 16:19    ASSESSMENT/PLAN:  1. Nonintractable episodic headache, unspecified headache type - continue Tylenol 325 mg 2 tabs = 650 mg PO Q 6 hours PRN; she does not want to see the neurologist but wants to follow-up with Dr. Earlie Server, instead   2. Stage IV squamous cell carcinoma of right lung ([place]) - requested to follow-up with Dr.Mohammed, oncologist   3. History of CVA (cerebrovascular accident) - continue on Plavix 75 mg 1 tab by mouth daily and atorvastatin 80 mg 1 tab by mouth daily evening   4. DVT, recurrent, lower extremity, acute, left ([place]) - not on any anticoagulation due to a recent hx of GI Bleed, continue Plavix 75 mg PO Q D       Durenda Age, NP [employer] [phone]

## 2016-10-23 ENCOUNTER — Telehealth: Payer: Self-pay

## 2016-10-23 ENCOUNTER — Other Ambulatory Visit: Payer: Medicare Other

## 2016-10-23 ENCOUNTER — Ambulatory Visit: Payer: Medicare Other

## 2016-10-23 ENCOUNTER — Ambulatory Visit: Payer: Medicare Other | Admitting: Internal Medicine

## 2016-10-23 NOTE — Telephone Encounter (Signed)
Peggy called for Erica Jimenez. Erica Jimenez would like an appt with Dr [name]. She is no longer under hospice. Peggy"s number [phone] or call [facility] and ask for [name] at [phone].

## 2016-10-24 ENCOUNTER — Telehealth: Payer: Self-pay | Admitting: Internal Medicine

## 2016-10-24 NOTE — Telephone Encounter (Signed)
Scheduled f/u per sch message from [facility] - left message with appt date and time and sent reminder letter in the mail.

## 2016-10-29 LAB — CBC AND DIFFERENTIAL
HCT: 32 — AB (ref 36–46)
HEMOGLOBIN: 10.4 — AB (ref 12.0–16.0)
NEUTROS ABS: 2
PLATELETS: 191 (ref 150–399)
WBC: 5.4

## 2016-10-30 ENCOUNTER — Telehealth: Payer: Self-pay | Admitting: Internal Medicine

## 2016-10-30 NOTE — Telephone Encounter (Signed)
Left message per 6/28 sch message lab appt added to next available

## 2016-11-04 ENCOUNTER — Encounter: Payer: Self-pay | Admitting: Internal Medicine

## 2016-11-04 ENCOUNTER — Encounter: Payer: Self-pay | Admitting: Adult Health

## 2016-11-04 ENCOUNTER — Other Ambulatory Visit (HOSPITAL_BASED_OUTPATIENT_CLINIC_OR_DEPARTMENT_OTHER): Payer: Medicare Other

## 2016-11-04 ENCOUNTER — Telehealth: Payer: Self-pay | Admitting: Internal Medicine

## 2016-11-04 ENCOUNTER — Ambulatory Visit: Payer: Medicare Other | Admitting: Diagnostic Neuroimaging

## 2016-11-04 ENCOUNTER — Other Ambulatory Visit: Payer: Self-pay | Admitting: *Deleted

## 2016-11-04 ENCOUNTER — Ambulatory Visit (HOSPITAL_BASED_OUTPATIENT_CLINIC_OR_DEPARTMENT_OTHER): Payer: Medicare Other | Admitting: Internal Medicine

## 2016-11-04 ENCOUNTER — Non-Acute Institutional Stay (SKILLED_NURSING_FACILITY): Payer: Medicare Other | Admitting: Adult Health

## 2016-11-04 VITALS — BP 130/48 | HR 49 | Temp 98.1°F | Resp 18 | Ht 64.0 in | Wt 129.2 lb

## 2016-11-04 DIAGNOSIS — E785 Hyperlipidemia, unspecified: Secondary | ICD-10-CM | POA: Diagnosis not present

## 2016-11-04 DIAGNOSIS — I5042 Chronic combined systolic (congestive) and diastolic (congestive) heart failure: Secondary | ICD-10-CM

## 2016-11-04 DIAGNOSIS — R634 Abnormal weight loss: Secondary | ICD-10-CM

## 2016-11-04 DIAGNOSIS — R531 Weakness: Secondary | ICD-10-CM | POA: Diagnosis not present

## 2016-11-04 DIAGNOSIS — C3491 Malignant neoplasm of unspecified part of right bronchus or lung: Secondary | ICD-10-CM | POA: Diagnosis not present

## 2016-11-04 DIAGNOSIS — N183 Chronic kidney disease, stage 3 unspecified: Secondary | ICD-10-CM

## 2016-11-04 DIAGNOSIS — F329 Major depressive disorder, single episode, unspecified: Secondary | ICD-10-CM | POA: Diagnosis not present

## 2016-11-04 DIAGNOSIS — E119 Type 2 diabetes mellitus without complications: Secondary | ICD-10-CM | POA: Diagnosis not present

## 2016-11-04 DIAGNOSIS — Z794 Long term (current) use of insulin: Secondary | ICD-10-CM

## 2016-11-04 DIAGNOSIS — F32A Depression, unspecified: Secondary | ICD-10-CM

## 2016-11-04 DIAGNOSIS — I2510 Atherosclerotic heart disease of native coronary artery without angina pectoris: Secondary | ICD-10-CM

## 2016-11-04 DIAGNOSIS — I82402 Acute embolism and thrombosis of unspecified deep veins of left lower extremity: Secondary | ICD-10-CM

## 2016-11-04 DIAGNOSIS — C3431 Malignant neoplasm of lower lobe, right bronchus or lung: Secondary | ICD-10-CM | POA: Diagnosis not present

## 2016-11-04 DIAGNOSIS — I639 Cerebral infarction, unspecified: Secondary | ICD-10-CM | POA: Diagnosis not present

## 2016-11-04 DIAGNOSIS — I10 Essential (primary) hypertension: Secondary | ICD-10-CM

## 2016-11-04 DIAGNOSIS — M1A9XX0 Chronic gout, unspecified, without tophus (tophi): Secondary | ICD-10-CM

## 2016-11-04 DIAGNOSIS — E039 Hypothyroidism, unspecified: Secondary | ICD-10-CM

## 2016-11-04 DIAGNOSIS — J310 Chronic rhinitis: Secondary | ICD-10-CM

## 2016-11-04 DIAGNOSIS — Z7189 Other specified counseling: Secondary | ICD-10-CM

## 2016-11-04 DIAGNOSIS — H409 Unspecified glaucoma: Secondary | ICD-10-CM | POA: Diagnosis not present

## 2016-11-04 DIAGNOSIS — E1169 Type 2 diabetes mellitus with other specified complication: Secondary | ICD-10-CM

## 2016-11-04 DIAGNOSIS — E1140 Type 2 diabetes mellitus with diabetic neuropathy, unspecified: Secondary | ICD-10-CM

## 2016-11-04 DIAGNOSIS — Z8673 Personal history of transient ischemic attack (TIA), and cerebral infarction without residual deficits: Secondary | ICD-10-CM | POA: Diagnosis not present

## 2016-11-04 DIAGNOSIS — Z5112 Encounter for antineoplastic immunotherapy: Secondary | ICD-10-CM

## 2016-11-04 DIAGNOSIS — D631 Anemia in chronic kidney disease: Secondary | ICD-10-CM

## 2016-11-04 DIAGNOSIS — Z8719 Personal history of other diseases of the digestive system: Secondary | ICD-10-CM

## 2016-11-04 HISTORY — DX: Encounter for antineoplastic immunotherapy: Z51.12

## 2016-11-04 LAB — CBC WITH DIFFERENTIAL/PLATELET
BASO%: 0.8 % (ref 0.0–2.0)
BASOS ABS: 0 10*3/uL (ref 0.0–0.1)
EOS ABS: 0.3 10*3/uL (ref 0.0–0.5)
EOS%: 4.7 % (ref 0.0–7.0)
HCT: 35.3 % (ref 34.8–46.6)
HEMOGLOBIN: 11.4 g/dL — AB (ref 11.6–15.9)
LYMPH%: 33.3 % (ref 14.0–49.7)
MCH: 30.6 pg (ref 25.1–34.0)
MCHC: 32.2 g/dL (ref 31.5–36.0)
MCV: 94.8 fL (ref 79.5–101.0)
MONO#: 0.6 10*3/uL (ref 0.1–0.9)
MONO%: 11 % (ref 0.0–14.0)
NEUT#: 2.9 10*3/uL (ref 1.5–6.5)
NEUT%: 50.2 % (ref 38.4–76.8)
Platelets: 201 10*3/uL (ref 145–400)
RBC: 3.72 10*6/uL (ref 3.70–5.45)
RDW: 17.3 % — AB (ref 11.2–14.5)
WBC: 5.8 10*3/uL (ref 3.9–10.3)
lymph#: 1.9 10*3/uL (ref 0.9–3.3)

## 2016-11-04 LAB — COMPREHENSIVE METABOLIC PANEL
ALBUMIN: 3.7 g/dL (ref 3.5–5.0)
ALK PHOS: 84 U/L (ref 40–150)
ALT: 8 U/L (ref 0–55)
AST: 17 U/L (ref 5–34)
Anion Gap: 11 mEq/L (ref 3–11)
BUN: 56.6 mg/dL — ABNORMAL HIGH (ref 7.0–26.0)
CO2: 21 mEq/L — ABNORMAL LOW (ref 22–29)
Calcium: 10.1 mg/dL (ref 8.4–10.4)
Chloride: 106 mEq/L (ref 98–109)
Creatinine: 2.4 mg/dL — ABNORMAL HIGH (ref 0.6–1.1)
EGFR: 21 mL/min/{1.73_m2} — AB (ref >90)
GLUCOSE: 173 mg/dL — AB (ref 70–140)
POTASSIUM: 5 meq/L (ref 3.5–5.1)
SODIUM: 139 meq/L (ref 136–145)
Total Bilirubin: 0.55 mg/dL (ref 0.20–1.20)
Total Protein: 7.3 g/dL (ref 6.4–8.3)

## 2016-11-04 MED ORDER — BISOPROLOL FUMARATE 5 MG PO TABS: 2.5000 mg | ORAL_TABLET | Freq: Every day | ORAL | 0 refills | Status: AC

## 2016-11-04 MED ORDER — FUROSEMIDE 40 MG PO TABS: 40.0000 mg | ORAL_TABLET | Freq: Every day | ORAL | 0 refills | Status: AC

## 2016-11-04 MED ORDER — CLOPIDOGREL BISULFATE 75 MG PO TABS: 75.0000 mg | ORAL_TABLET | Freq: Every day | ORAL | 0 refills | Status: AC

## 2016-11-04 MED ORDER — CITALOPRAM HYDROBROMIDE 10 MG PO TABS: 20.0000 mg | ORAL_TABLET | Freq: Every day | ORAL | 0 refills | Status: AC

## 2016-11-04 MED ORDER — PANTOPRAZOLE SODIUM 40 MG PO TBEC: 40.0000 mg | DELAYED_RELEASE_TABLET | Freq: Two times a day (BID) | ORAL | 0 refills | Status: AC

## 2016-11-04 MED ORDER — BRINZOLAMIDE 1 % OP SUSP: 1.0000 [drp] | Freq: Two times a day (BID) | OPHTHALMIC | 0 refills | Status: AC

## 2016-11-04 MED ORDER — ISOSORBIDE MONONITRATE ER 60 MG PO TB24: 60.0000 mg | ORAL_TABLET | Freq: Every day | ORAL | 1 refills | Status: AC

## 2016-11-04 MED ORDER — TRIAMCINOLONE ACETONIDE 55 MCG/ACT NA AERO: 1.0000 | INHALATION_SPRAY | Freq: Two times a day (BID) | NASAL | 0 refills | Status: AC

## 2016-11-04 MED ORDER — ALLOPURINOL 100 MG PO TABS: 100.0000 mg | ORAL_TABLET | Freq: Every day | ORAL | 0 refills | Status: AC

## 2016-11-04 MED ORDER — GABAPENTIN 100 MG PO CAPS: 100.0000 mg | ORAL_CAPSULE | Freq: Three times a day (TID) | ORAL | 0 refills | Status: AC

## 2016-11-04 MED ORDER — HYDRALAZINE HCL 50 MG PO TABS: 50.0000 mg | ORAL_TABLET | Freq: Two times a day (BID) | ORAL | 0 refills | Status: AC

## 2016-11-04 MED ORDER — NITROGLYCERIN 0.4 MG SL SUBL: 0.4000 mg | SUBLINGUAL_TABLET | SUBLINGUAL | 0 refills | Status: AC | PRN

## 2016-11-04 MED ORDER — LATANOPROST 0.005 % OP SOLN: 1.0000 [drp] | Freq: Every day | OPHTHALMIC | 0 refills | Status: AC

## 2016-11-04 MED ORDER — ATORVASTATIN CALCIUM 80 MG PO TABS: 80.0000 mg | ORAL_TABLET | Freq: Every day | ORAL | 0 refills | Status: AC

## 2016-11-04 MED ORDER — LEVOTHYROXINE SODIUM 50 MCG PO TABS: 50.0000 ug | ORAL_TABLET | Freq: Every day | ORAL | 0 refills | Status: AC

## 2016-11-04 MED ORDER — INSULIN GLARGINE 100 UNITS/ML SOLOSTAR PEN: 10.0000 [IU] | PEN_INJECTOR | Freq: Every day | SUBCUTANEOUS | 0 refills | Status: AC

## 2016-11-04 MED ORDER — INSULIN LISPRO 100 UNIT/ML ~~LOC~~ SOLN: 2.0000 [IU] | Freq: Every day | SUBCUTANEOUS | 0 refills | Status: AC | PRN

## 2016-11-04 MED ORDER — LIDOCAINE-PRILOCAINE 2.5-2.5 % EX CREA: 1.0000 "application " | TOPICAL_CREAM | CUTANEOUS | 0 refills | Status: AC | PRN

## 2016-11-04 NOTE — Telephone Encounter (Signed)
No additional appts scheduled yet, per 7/3 los - The patient will call for follow-up visit if she is considering treatment.

## 2016-11-04 NOTE — Progress Notes (Signed)
[place] [phone]   [phone]  OFFICE PROGRESS NOTE  Erica Jimenez  DIAGNOSIS: stage IIIB/IV non-small cell lung cancer, squamous cell carcinoma with PDL 1 expression of 15% diagnosed in March 2018 and presented with multiple masses in the right upper and lower lobes in addition to mediastinal lymphadenopathy and questionable nodule in the left upper lobe.  PRIOR THERAPY: Palliative systemic chemotherapy with carboplatin for AUC of 5 and paclitaxel 175 MG/M2 every 3 weeks with Neulasta support. Status post one cycle. First dose was given on 08/21/2016. Discontinued secondary to intolerance.  CURRENT THERAPY:   INTERVAL HISTORY: Erica Jimenez 83 y.o. female returns to the clinic today for follow-up visit accompanied by her daughter. The patient is feeling fine today except for fatigue and lack of appetite and weight loss. She was treated with 1 cycle of systemic chemotherapy with carboplatin and paclitaxel on 08/21/2016 but this was discontinued secondary to intolerance and admission to the hospital with gastrointestinal bleeding. She also was treated for a stroke. She is currently at the [facility] skilled nursing facility. She is expected to go home and few days. She was evaluated by palliative care and hospice service but the patient is not sure about her decision. She is here today for evaluation and discussion of her treatment options.  MEDICAL HISTORY: Past Medical History:  Diagnosis Date  . A-V fistula ([place])    pt has 2  fistulas in rt arm  . Alopecia   . Anemia   . Anxiety   . Aortic stenosis   . Arthritis    gout  . Bladder cancer [facility])     s/p resection and BCG treatment.   Marland Kitchen CAD (coronary artery disease)     Pt is s/p anterior MI in 1996 with stent placed in LAD.  Last myoview in our office was in 7/05 and  showed apical anterior, apical, and apical inferior infarct.  Minimal peri-infarct ischemia.  EF 49%.   .  Cardiomyopathy, ischemic   . CKD (chronic kidney disease)     Pt is stage III-IV.  She had a fistula placed but is not on dialysis.  She follows with Dr. Lorrene Reid for  nephrology.   Marland Kitchen COPD (chronic obstructive pulmonary disease) ([place])   . CVA (cerebral vascular accident) ([place])   . DM (diabetes mellitus) ([place])   . Full dentures   . Gout   . History of glaucoma   . HOH (hard of hearing)    slightly  . HTN (hypertension)   . Hyperlipidemia   . Hypothyroidism   . Myocardial infarction ([place])   . PAD (peripheral artery disease) ([facility])     arterial doppler study 12/04 suggestive of > 50% bilateral SFA stenosis.  Pt has mild claudication.  No invasive evaluation given CKD and desire to avoid contrast use.   Marland Kitchen Shortness of breath dyspnea    on exertion  . Wears glasses     ALLERGIES:  is allergic to contrast media [iodinated diagnostic agents]; fluoxetine; and sulfonamide derivatives.  MEDICATIONS:  Current Outpatient Prescriptions  Medication Sig Dispense Refill  . acetaminophen (TYLENOL) 325 MG tablet Take 650 mg by mouth every 6 (six) hours as needed.    Marland Kitchen allopurinol (ZYLOPRIM) 100 MG tablet Take 100 mg by mouth daily.    Marland Kitchen atorvastatin (LIPITOR) 80 MG tablet Take 80 mg by mouth daily after supper.     . bisoprolol (ZEBETA) 5 MG tablet take 1/2 tablet  by mouth once daily **PLEASE KEEP UPCOMING APPOINTMENT FOR FURTHER REFILLS** 15 tablet 0  . brinzolamide (AZOPT) 1 % ophthalmic suspension Place 1 drop into both eyes 2 (two) times daily.     . Carboxymethylcellulose Sodium (REFRESH TEARS OP) Place 1 drop into both eyes 3 (three) times daily as needed (dry eyes). Both eyes.    . citalopram (CELEXA) 10 MG tablet Take 2 tablets (20 mg total) by mouth daily after supper. 60 tablet 0  . clopidogrel (PLAVIX) 75 MG tablet Take 75 mg by mouth daily.    . ferrous sulfate 325 (65 FE) MG tablet Take 325 mg by mouth daily. After supper    . folic acid (FOLVITE) 1 MG tablet Take 1 mg by mouth daily.      . furosemide (LASIX) 40 MG tablet Take 40 mg by mouth daily.     Marland Kitchen gabapentin (NEURONTIN) 100 MG capsule Take 100 mg by mouth 3 (three) times daily.    . hydrALAZINE (APRESOLINE) 50 MG tablet Take 1 tablet (50 mg total) by mouth 2 (two) times daily. 60 tablet 0  . insulin glargine (LANTUS) 100 unit/mL SOPN Inject 0.1 mLs (10 Units total) into the skin daily before breakfast. 15 mL 11  . insulin lispro (HUMALOG) 100 UNIT/ML injection Inject 2-4 Units into the skin daily. Cover only post meal glucose at lunch. <150 no insulin, 151-299 = 2 units, >300 = 4 units    . isosorbide mononitrate (IMDUR) 60 MG 24 hr tablet Take 60 mg by mouth daily after supper.     . latanoprost (XALATAN) 0.005 % ophthalmic solution Place 1 drop into both eyes at bedtime.     Marland Kitchen levothyroxine (SYNTHROID, LEVOTHROID) 50 MCG tablet Take 50 mcg by mouth daily after supper.     . lidocaine-prilocaine (EMLA) cream Apply 1 application topically as needed. 30 g 0  . nitroGLYCERIN (NITROSTAT) 0.4 MG SL tablet Place 1 tablet (0.4 mg total) under the tongue every 5 (five) minutes as needed for chest pain. 25 tablet 3  . NUTRITIONAL SUPPLEMENT LIQD Take 120 mLs by mouth 3 (three) times daily. Med[employer]    . pantoprazole (PROTONIX) 40 MG tablet Take 1 tablet (40 mg total) by mouth 2 (two) times daily. 60 tablet 0  . potassium chloride SA (K-DUR,KLOR-CON) 20 MEQ tablet Take 20 mEq by mouth 2 (two) times daily.    Marland Kitchen triamcinolone (NASACORT) 55 MCG/ACT AERO nasal inhaler Place 1 spray into the nose 2 (two) times daily. Aim toward ear, not straight up nose     No current facility-administered medications for this visit.     SURGICAL HISTORY:  Past Surgical History:  Procedure Laterality Date  . ABDOMINAL AORTAGRAM N/A 08/17/2013   Procedure: ABDOMINAL Maxcine Ham;  Surgeon: Wellington Hampshire, MD;  Location: [facility];  Service: Cardiovascular;  Laterality: N/A;  . ANGIOPLASTY    . CYSTOSCOPY/RETROGRADE/URETEROSCOPY Bilateral 01/09/2015    Procedure: CYSTOSCOPY BILATERAL RETROGRADE PYELOGRAM, WASHINGS RENAL PELVIS & BLADDER ;  Surgeon: Lowella Bandy, MD;  Location: [facility];  Service: Urology;  Laterality: Bilateral;  . CYSTOSTOMY W/ BLADDER BIOPSY    . ESOPHAGOGASTRODUODENOSCOPY N/A 09/02/2016   Procedure: ESOPHAGOGASTRODUODENOSCOPY (EGD);  Surgeon: Jerene Bears, MD;  Location: [facility];  Service: Endoscopy;  Laterality: N/A;  . EYE SURGERY Bilateral    cataract w IOL    REVIEW OF SYSTEMS:  Constitutional: positive for anorexia, fatigue and weight loss Eyes: negative Ears, nose, mouth, throat, and face: negative Respiratory: positive for  dyspnea on exertion Cardiovascular: negative Gastrointestinal: negative Genitourinary:negative Integument/breast: negative Hematologic/lymphatic: negative Musculoskeletal:positive for arthralgias and muscle weakness Neurological: negative Behavioral/Psych: negative Endocrine: negative Allergic/Immunologic: negative   PHYSICAL EXAMINATION: General appearance: alert, cooperative, fatigued and no distress Head: Normocephalic, without obvious abnormality, atraumatic Neck: no adenopathy, no JVD, supple, symmetrical, trachea midline and thyroid not enlarged, symmetric, no tenderness/mass/nodules Lymph nodes: Cervical, supraclavicular, and axillary nodes normal. Resp: clear to auscultation bilaterally Back: symmetric, no curvature. ROM normal. No CVA tenderness. Cardio: regular rate and rhythm, S1, S2 normal, no murmur, click, rub or gallop GI: soft, non-tender; bowel sounds normal; no masses,  no organomegaly Extremities: extremities normal, atraumatic, no cyanosis or edema  ECOG PERFORMANCE STATUS: 2 - Symptomatic, <50% confined to bed  Blood pressure (!) 130/48, pulse (!) 49, temperature 98.1 F (36.7 C), temperature source Oral, resp. rate 18, height 5\' 4"  (1.626 m), weight 129 lb 3.2 oz (58.6 kg), SpO2 100 %.  LABORATORY DATA: Lab Results  Component Value Date    WBC 5.8 11/04/2016   HGB 11.4 (L) 11/04/2016   HCT 35.3 11/04/2016   MCV 94.8 11/04/2016   PLT 201 11/04/2016      Chemistry      Component Value Date/Time   NA 137 10/05/2016 0814   NA 136 08/26/2016 1456   K 4.2 10/05/2016 0814   K 5.4 (H) 08/26/2016 1456   CL 106 10/05/2016 0814   CO2 19 (L) 10/05/2016 0814   CO2 19 (L) 08/26/2016 1456   BUN 21 (H) 10/05/2016 0814   BUN 69.2 (H) 08/26/2016 1456   CREATININE 2.01 (H) 10/05/2016 0814   CREATININE 2.3 (H) 08/26/2016 1456      Component Value Date/Time   CALCIUM 9.4 10/05/2016 0814   CALCIUM 9.2 08/26/2016 1456   ALKPHOS 78 10/03/2016 0816   ALKPHOS 79 08/26/2016 1456   AST 21 10/03/2016 0816   AST 58 (H) 08/26/2016 1456   ALT 11 (L) 10/03/2016 0816   ALT 32 08/26/2016 1456   BILITOT 0.7 10/03/2016 0816   BILITOT 0.37 08/26/2016 1456       RADIOGRAPHIC STUDIES: No results found.  ASSESSMENT AND PLAN:  This is a pleasant 83 years old African-American female with a stage IV non-small cell lung cancer, squamous cell carcinoma status post 1 cycle of systemic chemotherapy with carboplatin and paclitaxel giving 3 months ago and discontinued secondary to intolerance. The patient was treated recently for gastrointestinal bleeding in addition to stroke. She is currently a resident of skilled nursing facility. I had a lengthy discussion with the patient and her daughter today about her current condition and treatment options. I explained to the patient that I would not consider her to resume the same systemic chemotherapy but if she interested in treatment but consider her for treatment with second line immunotherapy with Pembrolizumab or Nivolumab. I discussed with the patient and her daughter the adverse effect of this treatment including but not limited to immune mediated skin rash, diarrhea, inflammation of the lung, kidney, liver, thyroid or other endocrine dysfunction. She was also given him the option of continuous palliative  care and hospice. The patient would like some time to think about her option. I will arrange a follow-up appointment for her once she make a decision regarding her treatment. For the weight loss, I gave the patient the option of treatment with Megace but she is concerned about the risk of deep venous thrombosis and pulmonary embolism. She declined this option. For the recent history of stroke, she  will continue her current treatment with Plavix. For diabetes mellitus, she will continue her current treatment with Lantus and insulin. She was advised to call immediately if she has any concerning symptoms in the interval. The patient voices understanding of current disease status and treatment options and is in agreement with the current care plan. All questions were answered. The patient knows to call the clinic with any problems, questions or concerns. We can certainly see the patient much sooner if necessary.  Disclaimer: This note was dictated with voice recognition software. Similar sounding words can inadvertently be transcribed and may not be corrected upon review.

## 2016-11-04 NOTE — Progress Notes (Signed)
DATE:  11/04/2016   MRN:  [id number]  BIRTHDAY: [birth year]  Facility:  Nursing Home Location:  [facility] Room Number: 932-I  LEVEL OF CARE:  SNF (31)  Contact Information    Name Relation Home Work Mobile   Davis,Peggy Daughter [phone]  [phone]       Code Status History    Date Active Date Inactive Code Status Order ID Comments User Context   10/01/2016  6:56 PM 10/05/2016  6:55 PM DNR [id number]  Hosie Poisson, MD Inpatient   09/05/2016  8:55 AM 09/08/2016  7:58 PM DNR [id number]  Doreatha Lew, MD Inpatient   09/01/2016  4:38 PM 09/05/2016  8:55 AM Full Code [id number]  Robbie Lis, MD Inpatient   08/16/2016 10:50 PM 08/18/2016  5:43 PM DNR [id number]  Toy Baker, MD Inpatient   08/17/2013  5:52 PM 08/17/2013 11:58 PM Full Code [id number]  Wellington Hampshire, MD Inpatient    Questions for Most Recent Historical Code Status (Order [id number])    Question Answer Comment   In the event of cardiac or respiratory ARREST Do not call a "code blue"    In the event of cardiac or respiratory ARREST Do not perform Intubation, CPR, defibrillation or ACLS    In the event of cardiac or respiratory ARREST Use medication by any route, position, wound care, and other measures to relive pain and suffering. May use oxygen, suction and manual treatment of airway obstruction as needed for comfort.         Advance Directive Documentation     Most Recent Value  Type of Advance Directive  Out of facility DNR (pink MOST or yellow form)  Pre-existing out of facility DNR order (yellow form or pink MOST form)  -  "MOST" Form in Place?  -       Chief Complaint  Patient presents with  . Discharge Note    Discharge    HISTORY OF PRESENT ILLNESS:  This is an 24-YO female seen for a discharge visit.  She will discharge to home with daughter on 11/06/2016.  She will receive home health PT and OT.  She has been admitted to [facility] on  10/05/16 from the hospital admission dates 10/01/16 thru 10/05/16 with acute small left frontal lobe nonhemorrhagic infarct and subacute subcentimeter left temporal lobe infarct. She has atrial fibrillation and is not on any anticoagulation due to history of GI bleed and recurrent falls. She has a history of GI bleed requiring 4 units of packed RBC. She has lung cancer and was on hospice at home.  She has a history of DVT in the  LLE calf. The plan is to have IVC filter placement if DVT progresses on doppler in 3-4 weeks. Neurology recommended Plavix and statin.  Patient was admitted to this facility for short-term rehabilitation after the patient's recent hospitalization.  Patient has completed SNF rehabilitation and therapy has cleared the patient for discharge.   PAST MEDICAL HISTORY:  Past Medical History:  Diagnosis Date  . A-V fistula ([place])    pt has 2  fistulas in rt arm  . Alopecia   . Anemia   . Anxiety   . Aortic stenosis   . Arthritis    gout  . Bladder cancer [facility])     s/p resection and BCG treatment.   Marland Kitchen CAD (coronary artery disease)     Pt is s/p anterior MI in 1996 with stent placed in LAD.  Last myoview in our office was in 7/05 and  showed apical anterior, apical, and apical inferior infarct.  Minimal peri-infarct ischemia.  EF 49%.   . Cardiomyopathy, ischemic   . CKD (chronic kidney disease)     Pt is stage III-IV.  She had a fistula placed but is not on dialysis.  She follows with Dr. Lorrene Reid for  nephrology.   Marland Kitchen COPD (chronic obstructive pulmonary disease) ([place])   . CVA (cerebral vascular accident) ([place])   . DM (diabetes mellitus) ([place])   . Encounter for antineoplastic immunotherapy 11/04/2016  . Full dentures   . Gout   . History of glaucoma   . HOH (hard of hearing)    slightly  . HTN (hypertension)   . Hyperlipidemia   . Hypothyroidism   . Myocardial infarction ([place])   . PAD (peripheral artery disease) (HCC)     arterial doppler study 12/04 suggestive of > 50%  bilateral SFA stenosis.  Pt has mild claudication.  No invasive evaluation given CKD and desire to avoid contrast use.   Marland Kitchen Shortness of breath dyspnea    on exertion  . Wears glasses      CURRENT MEDICATIONS: Reviewed  Patient's Medications  New Prescriptions   No medications on file  Previous Medications   ACETAMINOPHEN (TYLENOL) 325 MG TABLET    Take 650 mg by mouth every 6 (six) hours as needed.   ALLOPURINOL (ZYLOPRIM) 100 MG TABLET    Take 100 mg by mouth daily.   ATORVASTATIN (LIPITOR) 80 MG TABLET    Take 80 mg by mouth daily after supper.    BISOPROLOL (ZEBETA) 5 MG TABLET    take 1/2 tablet by mouth once daily **PLEASE KEEP UPCOMING APPOINTMENT FOR FURTHER REFILLS**   BRINZOLAMIDE (AZOPT) 1 % OPHTHALMIC SUSPENSION    Place 1 drop into both eyes 2 (two) times daily.    CARBOXYMETHYLCELLULOSE SODIUM (REFRESH TEARS OP)    Place 1 drop into both eyes 3 (three) times daily as needed (dry eyes). Both eyes.   CETIRIZINE (ZYRTEC) 10 MG TABLET    Take 10 mg by mouth at bedtime.   CITALOPRAM (CELEXA) 10 MG TABLET    Take 2 tablets (20 mg total) by mouth daily after supper.   CLOPIDOGREL (PLAVIX) 75 MG TABLET    Take 75 mg by mouth daily.   FERROUS SULFATE 325 (65 FE) MG TABLET    Take 325 mg by mouth daily. After supper   FOLIC ACID (FOLVITE) 1 MG TABLET    Take 1 mg by mouth daily.   FUROSEMIDE (LASIX) 40 MG TABLET    Take 40 mg by mouth daily.    GABAPENTIN (NEURONTIN) 100 MG CAPSULE    Take 100 mg by mouth every 8 (eight) hours.    HYDRALAZINE (APRESOLINE) 50 MG TABLET    Take 1 tablet (50 mg total) by mouth 2 (two) times daily.   HYDROXYZINE (ATARAX/VISTARIL) 25 MG TABLET    Take 25 mg by mouth every evening.   INSULIN GLARGINE (LANTUS) 100 UNIT/ML SOPN    Inject 0.1 mLs (10 Units total) into the skin daily before breakfast.   INSULIN LISPRO (HUMALOG) 100 UNIT/ML INJECTION    Inject 2-4 Units into the skin daily. Cover only post meal glucose at lunch. <150 no insulin, 151-299 = 2  units, >300 = 4 units   ISOSORBIDE MONONITRATE (IMDUR) 60 MG 24 HR TABLET    Take 60 mg by mouth daily after supper.    LATANOPROST (  XALATAN) 0.005 % OPHTHALMIC SOLUTION    Place 1 drop into both eyes at bedtime.    LEVOTHYROXINE (SYNTHROID, LEVOTHROID) 50 MCG TABLET    Take 50 mcg by mouth daily after supper.    LIDOCAINE-PRILOCAINE (EMLA) CREAM    Apply 1 application topically as needed.   NITROGLYCERIN (NITROSTAT) 0.4 MG SL TABLET    Place 1 tablet (0.4 mg total) under the tongue every 5 (five) minutes as needed for chest pain.   NUTRITIONAL SUPPLEMENT LIQD    Take 120 mLs by mouth 3 (three) times daily. [employer]   PANTOPRAZOLE (PROTONIX) 40 MG TABLET    Take 1 tablet (40 mg total) by mouth 2 (two) times daily.   POTASSIUM CHLORIDE SA (K-DUR,KLOR-CON) 20 MEQ TABLET    Take 20 mEq by mouth 2 (two) times daily.   TRIAMCINOLONE (NASACORT) 55 MCG/ACT AERO NASAL INHALER    Place 1 spray into the nose 2 (two) times daily. Aim toward ear, not straight up nose  Modified Medications   No medications on file  Discontinued Medications   No medications on file     Allergies  Allergen Reactions  . Contrast Media [Iodinated Diagnostic Agents] Other (See Comments)    Renal insufficiency  . Fluoxetine Rash  . Sulfonamide Derivatives Nausea Only     REVIEW OF SYSTEMS:  GENERAL: no change in appetite, no fatigue, no weight changes, no fever, chills  EYES: Denies change in vision, dry eyes, eye pain, itching or discharge EARS: Denies change in hearing, ringing in ears, or earache NOSE: Denies nasal congestion or epistaxis MOUTH and THROAT: Denies oral discomfort, gingival pain or bleeding, pain from teeth or hoarseness   RESPIRATORY: no cough, SOB, DOE, wheezing, hemoptysis CARDIAC: no chest pain, edema or palpitations GI: no abdominal pain, diarrhea, constipation, heart burn, nausea or vomiting GU: Denies dysuria, frequency, hematuria, incontinence, or discharge PSYCHIATRIC: Denies feeling of  depression or anxiety. No report of hallucinations, insomnia, paranoia, or agitation     PHYSICAL EXAMINATION  GENERAL APPEARANCE: Well nourished. In no acute distress. Normal body habitus SKIN:  Skin is warm and dry.  HEAD: Normal in size and contour. No evidence of trauma EYES: Lids open and close normally. No blepharitis, entropion or ectropion. PERRL. Conjunctivae are clear and sclerae are white. Lenses are without opacity EARS: Pinnae are normal. Patient hears normal voice tunes of the examiner MOUTH and THROAT: Lips are without lesions. Oral mucosa is moist and without lesions. Tongue is normal in shape, size, and color and without lesions NECK: supple, trachea midline, no neck masses, no thyroid tenderness, no thyromegaly RESPIRATORY: breathing is even & unlabored, BS CTAB CARDIAC: RRR, no murmur,no extra heart sounds, no edema GI: abdomen soft, normal BS, no masses, no tenderness, no hepatomegaly, no splenomegaly EXTREMITIES:  Able to move X 4 extremities PSYCHIATRIC: Alert and oriented X 3. Affect and behavior are appropriate   LABS/RADIOLOGY: Labs reviewed: Basic Metabolic Panel:  Recent Labs  09/06/16 0501  10/01/16 1610 10/03/16 0816 10/05/16 0814 11/04/16 0947  NA 146*  < > 139 139 137 139  K 4.2  < > 4.9 3.9 4.2 5.0  CL 125*  < > 105 111 106  --   CO2 17*  < >  --  21* 19* 21*  GLUCOSE 77  < > 231* 95 233* 173*  BUN 28*  < > 25* 16 21* 56.6*  CREATININE 2.24*  < > 1.70* 1.54* 2.01* 2.4*  CALCIUM 7.8*  < >  --  9.0 9.4 10.1  MG 1.8  --   --   --  1.6*  --   < > = values in this interval not displayed. Liver Function Tests:  Recent Labs  10/01/16 1602 10/03/16 0816 11/04/16 0947  AST 25 21 17   ALT 14 11* 8  ALKPHOS 94 78 84  BILITOT 0.7 0.7 0.55  PROT 6.7 5.7* 7.3  ALBUMIN 3.3* 2.7* 3.7    Recent Labs  09/01/16 1308  LIPASE 22   CBC:  Recent Labs  09/07/16 0510  10/01/16 1602 10/01/16 1610 10/03/16 0816 11/04/16 0947  WBC 13.7*  < >  7.7  --  5.0 5.8  NEUTROABS 11.1*  --  4.9  --   --  2.9  HGB 7.9*  < > 12.1 13.6 10.6* 11.4*  HCT 25.5*  < > 38.6 40.0 34.2* 35.3  MCV 98.1  < > 96.0  --  95.3 94.8  PLT 102*  < > 179  --  180 201  < > = values in this interval not displayed. Lipid Panel:  Recent Labs  08/17/16 0440 10/02/16 0249  HDL 42 53   Cardiac Enzymes:  Recent Labs  10/01/16 2235 10/02/16 0249 10/02/16 0746  TROPONINI 0.25* 0.26* 0.26*   CBG:  Recent Labs  10/04/16 2108 10/05/16 0616 10/05/16 1131  GLUCAP 124* 138* 121*     ASSESSMENT/PLAN:  1. Generalized weakness - for home health PT and OT, for therapeutic strengthening exercises; fall precautions   2. Stage IV squamous cell carcinoma of right lung ([place]) - follows with Oncology, Dr. [name], had an appointment today and asked for her other options   3. History of CVA (cerebrovascular accident) - stable; continue atorvastatin 80 mg 1 tab by mouth daily at bedtime, bisoprolol 5 mg 1/2 tab = 2.5 mg by mouth daily and hydralazine 50 mg 1 tab by mouth twice a day - clopidogrel (PLAVIX) 75 MG tablet; Take 1 tablet (75 mg total) by mouth daily.  Dispense: 30 tablet; Refill: 0  4. Essential hypertension - well controlled - bisoprolol (ZEBETA) 5 MG tablet; Take 0.5 tablets (2.5 mg total) by mouth daily.  Dispense: 15 tablet; Refill: 0 - hydrALAZINE (APRESOLINE) 50 MG tablet; Take 1 tablet (50 mg total) by mouth 2 (two) times daily.  Dispense: 60 tablet; Refill: 0  5. Glaucoma of both eyes, unspecified glaucoma type - no complaints of eye pain - brinzolamide (AZOPT) 1 % ophthalmic suspension; Place 1 drop into both eyes 2 (two) times daily.  Dispense: 10 mL; Refill: 0 - latanoprost (XALATAN) 0.005 % ophthalmic solution; Place 1 drop into both eyes at bedtime.  Dispense: 2.5 mL; Refill: 0  6. Chronic gout, unspecified cause, unspecified site - stable - allopurinol (ZYLOPRIM) 100 MG tablet; Take 1 tablet (100 mg total) by mouth daily.  Dispense:  30 tablet; Refill: 0  7. Hyperlipidemia, unspecified hyperlipidemia type - atorvastatin (LIPITOR) 80 MG tablet; Take 1 tablet (80 mg total) by mouth daily after supper.  Dispense: 30 tablet; Refill: 0 Lab Results  Component Value Date   CHOL 182 10/02/2016   HDL 53 10/02/2016   LDLCALC 111 (H) 10/02/2016   TRIG 90 10/02/2016   CHOLHDL 3.4 10/02/2016    8. Atherosclerosis of native coronary artery of native heart without angina pectoris - no complaints of chest pain - isosorbide mononitrate (IMDUR) 60 MG 24 hr tablet; Take 1 tablet (60 mg total) by mouth daily after supper.  Dispense: 30 tablet; Refill: 1 - nitroGLYCERIN (  NITROSTAT) 0.4 MG SL tablet; Place 1 tablet (0.4 mg total) under the tongue every 5 (five) minutes as needed for chest pain.  Dispense: 25 tablet; Refill: 0  9. DVT, recurrent, lower extremity, acute, left (HCC) - continue Plavix 75 mg 1 tab by mouth daily, not on any anticoagulation due to a recent hx of GI Bleed  10. Anemia in stage 3 chronic kidney disease - continue ferrous sulfate 325 mg 1 tab by mouth daily Lab Results  Component Value Date   HGB 11.4 (L) 11/04/2016     11. Chronic depression - stable - citalopram (CELEXA) 10 MG tablet; Take 2 tablets (20 mg total) by mouth daily after supper.  Dispense: 60 tablet; Refill: 0  12. Nonallergic rhinitis - continue Nasacort 1 spray each nostril twice a day   13. Hypothyroidism, unspecified type - latanoprost (XALATAN) 0.005 % ophthalmic solution; Place 1 drop into both eyes at bedtime.  Dispense: 2.5 mL; Refill: 0 - levothyroxine (SYNTHROID, LEVOTHROID) 50 MCG tablet; Take 1 tablet (50 mcg total) by mouth daily after supper.  Dispense: 30 tablet; Refill: 0 Lab Results  Component Value Date   TSH 3.677 09/04/2016    14. Chronic combined systolic and diastolic CHF (congestive heart failure) (HCC) - no SOB, change KCL ER 20 mee 1 tab PO Q MWFSat - furosemide (LASIX) 40 MG tablet; Take 1 tablet (40 mg total) by  mouth daily.  Dispense: 30 tablet; Refill: 0 - hydrALAZINE (APRESOLINE) 50 MG tablet; Take 1 tablet (50 mg total) by mouth 2 (two) times daily.  Dispense: 60 tablet; Refill: 0  15. History of GI bleed - no signs of bleeding - pantoprazole (PROTONIX) 40 MG tablet; Take 1 tablet (40 mg total) by mouth 2 (two) times daily.  Dispense: 60 tablet; Refill: 0  16. Type 2 diabetes mellitus with diabetic neuropathy, with long-term current use of insulin (HCC) - gabapentin (NEURONTIN) 100 MG capsule; Take 1 capsule (100 mg total) by mouth every 8 (eight) hours.  Dispense: 90 capsule; Refill: 0 - insulin glargine (LANTUS) 100 unit/mL SOPN; Inject 0.1 mLs (10 Units total) into the skin daily before breakfast.  Dispense: 15 mL; Refill: 0 - insulin lispro (HUMALOG) 100 UNIT/ML injection; Inject 0.02-0.04 mLs (2-4 Units total) into the skin daily as needed for high blood sugar. Post lunch only 151-299 = 2, >300 = 4  Dispense: 10 mL; Refill: 0 Lab Results  Component Value Date   HGBA1C 7.6 (H) 10/02/2016      I have filled out patient's discharge paperwork and written prescriptions.  Patient will receive home health PT and OT.  DME provided:  Wheelchair and rolling walker  Total discharge time: Greater than 30 minutes Greater than 50% was spent in counseling and coordination of care.   Discharge time involved coordination of the discharge process with social worker, nursing staff and therapy department. Medical justification for home health services/DME verified.    Arryana Tolleson C. [place] - NP    [employer] [phone]

## 2016-11-04 NOTE — Patient Outreach (Signed)
[facility]) Care Management  11/04/2016  Erica Jimenez 1930/01/19 [id number]   Met with Erica Jimenez, SW at facility, she states that patient is going home 11/06/16. She will go home with home care and not hospice.   Met with patient at bedside. She reports she saw her oncologist today and he is offering her a new treatment. She reports that she is not sure if she wants to pursue it, she has "a lot to think about". She states that she wants quality for the time she has left. She wants to spend time to with her daughter and make a decision around future treatment.   RNCM reviewed [facility] services with patient, left brochure and card. Patient wants to discuss options with her daughter and will give her [facility] contact and RNCM contact.   Plan to sign off. [name]. [name], RN, BSN, [place] [phone]) Business Cell  [phone]) Toll Free Office

## 2016-11-13 DIAGNOSIS — Z1389 Encounter for screening for other disorder: Secondary | ICD-10-CM | POA: Diagnosis not present

## 2016-11-13 DIAGNOSIS — J439 Emphysema, unspecified: Secondary | ICD-10-CM | POA: Diagnosis not present

## 2016-11-13 DIAGNOSIS — E1129 Type 2 diabetes mellitus with other diabetic kidney complication: Secondary | ICD-10-CM | POA: Diagnosis not present

## 2016-11-13 DIAGNOSIS — C3490 Malignant neoplasm of unspecified part of unspecified bronchus or lung: Secondary | ICD-10-CM | POA: Diagnosis not present

## 2016-11-13 DIAGNOSIS — N184 Chronic kidney disease, stage 4 (severe): Secondary | ICD-10-CM | POA: Diagnosis not present

## 2016-11-13 DIAGNOSIS — Z6820 Body mass index (BMI) 20.0-20.9, adult: Secondary | ICD-10-CM | POA: Diagnosis not present

## 2016-11-19 DIAGNOSIS — L602 Onychogryphosis: Secondary | ICD-10-CM | POA: Diagnosis not present

## 2016-11-19 DIAGNOSIS — E1351 Other specified diabetes mellitus with diabetic peripheral angiopathy without gangrene: Secondary | ICD-10-CM | POA: Diagnosis not present

## 2016-12-04 DIAGNOSIS — K219 Gastro-esophageal reflux disease without esophagitis: Secondary | ICD-10-CM | POA: Diagnosis not present

## 2016-12-04 DIAGNOSIS — I25119 Atherosclerotic heart disease of native coronary artery with unspecified angina pectoris: Secondary | ICD-10-CM | POA: Diagnosis not present

## 2016-12-04 DIAGNOSIS — D638 Anemia in other chronic diseases classified elsewhere: Secondary | ICD-10-CM | POA: Diagnosis not present

## 2016-12-04 DIAGNOSIS — I10 Essential (primary) hypertension: Secondary | ICD-10-CM | POA: Diagnosis not present

## 2016-12-04 DIAGNOSIS — H409 Unspecified glaucoma: Secondary | ICD-10-CM | POA: Diagnosis not present

## 2016-12-04 DIAGNOSIS — E46 Unspecified protein-calorie malnutrition: Secondary | ICD-10-CM | POA: Diagnosis not present

## 2016-12-04 DIAGNOSIS — I679 Cerebrovascular disease, unspecified: Secondary | ICD-10-CM | POA: Diagnosis not present

## 2016-12-04 DIAGNOSIS — E039 Hypothyroidism, unspecified: Secondary | ICD-10-CM | POA: Diagnosis not present

## 2016-12-04 DIAGNOSIS — I4891 Unspecified atrial fibrillation: Secondary | ICD-10-CM | POA: Diagnosis not present

## 2016-12-04 DIAGNOSIS — F339 Major depressive disorder, recurrent, unspecified: Secondary | ICD-10-CM | POA: Diagnosis not present

## 2016-12-04 DIAGNOSIS — N184 Chronic kidney disease, stage 4 (severe): Secondary | ICD-10-CM | POA: Diagnosis not present

## 2016-12-04 DIAGNOSIS — C3490 Malignant neoplasm of unspecified part of unspecified bronchus or lung: Secondary | ICD-10-CM | POA: Diagnosis not present

## 2016-12-04 DIAGNOSIS — I509 Heart failure, unspecified: Secondary | ICD-10-CM | POA: Diagnosis not present

## 2016-12-04 DIAGNOSIS — M109 Gout, unspecified: Secondary | ICD-10-CM | POA: Diagnosis not present

## 2016-12-04 DIAGNOSIS — J449 Chronic obstructive pulmonary disease, unspecified: Secondary | ICD-10-CM | POA: Diagnosis not present

## 2016-12-04 DIAGNOSIS — E1159 Type 2 diabetes mellitus with other circulatory complications: Secondary | ICD-10-CM | POA: Diagnosis not present

## 2016-12-05 DIAGNOSIS — I25119 Atherosclerotic heart disease of native coronary artery with unspecified angina pectoris: Secondary | ICD-10-CM | POA: Diagnosis not present

## 2016-12-05 DIAGNOSIS — J449 Chronic obstructive pulmonary disease, unspecified: Secondary | ICD-10-CM | POA: Diagnosis not present

## 2016-12-05 DIAGNOSIS — I4891 Unspecified atrial fibrillation: Secondary | ICD-10-CM | POA: Diagnosis not present

## 2016-12-05 DIAGNOSIS — I679 Cerebrovascular disease, unspecified: Secondary | ICD-10-CM | POA: Diagnosis not present

## 2016-12-05 DIAGNOSIS — C3490 Malignant neoplasm of unspecified part of unspecified bronchus or lung: Secondary | ICD-10-CM | POA: Diagnosis not present

## 2016-12-05 DIAGNOSIS — E1159 Type 2 diabetes mellitus with other circulatory complications: Secondary | ICD-10-CM | POA: Diagnosis not present

## 2016-12-08 DIAGNOSIS — I25119 Atherosclerotic heart disease of native coronary artery with unspecified angina pectoris: Secondary | ICD-10-CM | POA: Diagnosis not present

## 2016-12-08 DIAGNOSIS — I679 Cerebrovascular disease, unspecified: Secondary | ICD-10-CM | POA: Diagnosis not present

## 2016-12-08 DIAGNOSIS — C3490 Malignant neoplasm of unspecified part of unspecified bronchus or lung: Secondary | ICD-10-CM | POA: Diagnosis not present

## 2016-12-08 DIAGNOSIS — E1159 Type 2 diabetes mellitus with other circulatory complications: Secondary | ICD-10-CM | POA: Diagnosis not present

## 2016-12-08 DIAGNOSIS — J449 Chronic obstructive pulmonary disease, unspecified: Secondary | ICD-10-CM | POA: Diagnosis not present

## 2016-12-08 DIAGNOSIS — I4891 Unspecified atrial fibrillation: Secondary | ICD-10-CM | POA: Diagnosis not present

## 2016-12-09 DIAGNOSIS — E1159 Type 2 diabetes mellitus with other circulatory complications: Secondary | ICD-10-CM | POA: Diagnosis not present

## 2016-12-09 DIAGNOSIS — I4891 Unspecified atrial fibrillation: Secondary | ICD-10-CM | POA: Diagnosis not present

## 2016-12-09 DIAGNOSIS — C3490 Malignant neoplasm of unspecified part of unspecified bronchus or lung: Secondary | ICD-10-CM | POA: Diagnosis not present

## 2016-12-09 DIAGNOSIS — J449 Chronic obstructive pulmonary disease, unspecified: Secondary | ICD-10-CM | POA: Diagnosis not present

## 2016-12-09 DIAGNOSIS — I25119 Atherosclerotic heart disease of native coronary artery with unspecified angina pectoris: Secondary | ICD-10-CM | POA: Diagnosis not present

## 2016-12-09 DIAGNOSIS — I679 Cerebrovascular disease, unspecified: Secondary | ICD-10-CM | POA: Diagnosis not present

## 2016-12-10 DIAGNOSIS — E1159 Type 2 diabetes mellitus with other circulatory complications: Secondary | ICD-10-CM | POA: Diagnosis not present

## 2016-12-10 DIAGNOSIS — I679 Cerebrovascular disease, unspecified: Secondary | ICD-10-CM | POA: Diagnosis not present

## 2016-12-10 DIAGNOSIS — I4891 Unspecified atrial fibrillation: Secondary | ICD-10-CM | POA: Diagnosis not present

## 2016-12-10 DIAGNOSIS — I25119 Atherosclerotic heart disease of native coronary artery with unspecified angina pectoris: Secondary | ICD-10-CM | POA: Diagnosis not present

## 2016-12-10 DIAGNOSIS — C3490 Malignant neoplasm of unspecified part of unspecified bronchus or lung: Secondary | ICD-10-CM | POA: Diagnosis not present

## 2016-12-10 DIAGNOSIS — J449 Chronic obstructive pulmonary disease, unspecified: Secondary | ICD-10-CM | POA: Diagnosis not present

## 2016-12-12 DIAGNOSIS — E1159 Type 2 diabetes mellitus with other circulatory complications: Secondary | ICD-10-CM | POA: Diagnosis not present

## 2016-12-12 DIAGNOSIS — C3490 Malignant neoplasm of unspecified part of unspecified bronchus or lung: Secondary | ICD-10-CM | POA: Diagnosis not present

## 2016-12-12 DIAGNOSIS — I25119 Atherosclerotic heart disease of native coronary artery with unspecified angina pectoris: Secondary | ICD-10-CM | POA: Diagnosis not present

## 2016-12-12 DIAGNOSIS — J449 Chronic obstructive pulmonary disease, unspecified: Secondary | ICD-10-CM | POA: Diagnosis not present

## 2016-12-12 DIAGNOSIS — I4891 Unspecified atrial fibrillation: Secondary | ICD-10-CM | POA: Diagnosis not present

## 2016-12-12 DIAGNOSIS — I679 Cerebrovascular disease, unspecified: Secondary | ICD-10-CM | POA: Diagnosis not present

## 2016-12-15 DIAGNOSIS — J449 Chronic obstructive pulmonary disease, unspecified: Secondary | ICD-10-CM | POA: Diagnosis not present

## 2016-12-15 DIAGNOSIS — C3490 Malignant neoplasm of unspecified part of unspecified bronchus or lung: Secondary | ICD-10-CM | POA: Diagnosis not present

## 2016-12-15 DIAGNOSIS — I25119 Atherosclerotic heart disease of native coronary artery with unspecified angina pectoris: Secondary | ICD-10-CM | POA: Diagnosis not present

## 2016-12-15 DIAGNOSIS — I4891 Unspecified atrial fibrillation: Secondary | ICD-10-CM | POA: Diagnosis not present

## 2016-12-15 DIAGNOSIS — E1159 Type 2 diabetes mellitus with other circulatory complications: Secondary | ICD-10-CM | POA: Diagnosis not present

## 2016-12-15 DIAGNOSIS — I679 Cerebrovascular disease, unspecified: Secondary | ICD-10-CM | POA: Diagnosis not present

## 2016-12-17 DIAGNOSIS — J449 Chronic obstructive pulmonary disease, unspecified: Secondary | ICD-10-CM | POA: Diagnosis not present

## 2016-12-17 DIAGNOSIS — I4891 Unspecified atrial fibrillation: Secondary | ICD-10-CM | POA: Diagnosis not present

## 2016-12-17 DIAGNOSIS — I679 Cerebrovascular disease, unspecified: Secondary | ICD-10-CM | POA: Diagnosis not present

## 2016-12-17 DIAGNOSIS — C3490 Malignant neoplasm of unspecified part of unspecified bronchus or lung: Secondary | ICD-10-CM | POA: Diagnosis not present

## 2016-12-17 DIAGNOSIS — E1159 Type 2 diabetes mellitus with other circulatory complications: Secondary | ICD-10-CM | POA: Diagnosis not present

## 2016-12-17 DIAGNOSIS — I25119 Atherosclerotic heart disease of native coronary artery with unspecified angina pectoris: Secondary | ICD-10-CM | POA: Diagnosis not present

## 2016-12-19 DIAGNOSIS — C3490 Malignant neoplasm of unspecified part of unspecified bronchus or lung: Secondary | ICD-10-CM | POA: Diagnosis not present

## 2016-12-19 DIAGNOSIS — J449 Chronic obstructive pulmonary disease, unspecified: Secondary | ICD-10-CM | POA: Diagnosis not present

## 2016-12-19 DIAGNOSIS — I4891 Unspecified atrial fibrillation: Secondary | ICD-10-CM | POA: Diagnosis not present

## 2016-12-19 DIAGNOSIS — I679 Cerebrovascular disease, unspecified: Secondary | ICD-10-CM | POA: Diagnosis not present

## 2016-12-19 DIAGNOSIS — E1159 Type 2 diabetes mellitus with other circulatory complications: Secondary | ICD-10-CM | POA: Diagnosis not present

## 2016-12-19 DIAGNOSIS — I25119 Atherosclerotic heart disease of native coronary artery with unspecified angina pectoris: Secondary | ICD-10-CM | POA: Diagnosis not present

## 2016-12-22 DIAGNOSIS — J449 Chronic obstructive pulmonary disease, unspecified: Secondary | ICD-10-CM | POA: Diagnosis not present

## 2016-12-22 DIAGNOSIS — E1159 Type 2 diabetes mellitus with other circulatory complications: Secondary | ICD-10-CM | POA: Diagnosis not present

## 2016-12-22 DIAGNOSIS — I4891 Unspecified atrial fibrillation: Secondary | ICD-10-CM | POA: Diagnosis not present

## 2016-12-22 DIAGNOSIS — I25119 Atherosclerotic heart disease of native coronary artery with unspecified angina pectoris: Secondary | ICD-10-CM | POA: Diagnosis not present

## 2016-12-22 DIAGNOSIS — C3490 Malignant neoplasm of unspecified part of unspecified bronchus or lung: Secondary | ICD-10-CM | POA: Diagnosis not present

## 2016-12-22 DIAGNOSIS — I679 Cerebrovascular disease, unspecified: Secondary | ICD-10-CM | POA: Diagnosis not present

## 2016-12-24 DIAGNOSIS — C3490 Malignant neoplasm of unspecified part of unspecified bronchus or lung: Secondary | ICD-10-CM | POA: Diagnosis not present

## 2016-12-24 DIAGNOSIS — E1159 Type 2 diabetes mellitus with other circulatory complications: Secondary | ICD-10-CM | POA: Diagnosis not present

## 2016-12-24 DIAGNOSIS — J449 Chronic obstructive pulmonary disease, unspecified: Secondary | ICD-10-CM | POA: Diagnosis not present

## 2016-12-24 DIAGNOSIS — I25119 Atherosclerotic heart disease of native coronary artery with unspecified angina pectoris: Secondary | ICD-10-CM | POA: Diagnosis not present

## 2016-12-24 DIAGNOSIS — I679 Cerebrovascular disease, unspecified: Secondary | ICD-10-CM | POA: Diagnosis not present

## 2016-12-24 DIAGNOSIS — I4891 Unspecified atrial fibrillation: Secondary | ICD-10-CM | POA: Diagnosis not present

## 2016-12-26 DIAGNOSIS — I679 Cerebrovascular disease, unspecified: Secondary | ICD-10-CM | POA: Diagnosis not present

## 2016-12-26 DIAGNOSIS — J449 Chronic obstructive pulmonary disease, unspecified: Secondary | ICD-10-CM | POA: Diagnosis not present

## 2016-12-26 DIAGNOSIS — I4891 Unspecified atrial fibrillation: Secondary | ICD-10-CM | POA: Diagnosis not present

## 2016-12-26 DIAGNOSIS — C3490 Malignant neoplasm of unspecified part of unspecified bronchus or lung: Secondary | ICD-10-CM | POA: Diagnosis not present

## 2016-12-26 DIAGNOSIS — E1159 Type 2 diabetes mellitus with other circulatory complications: Secondary | ICD-10-CM | POA: Diagnosis not present

## 2016-12-26 DIAGNOSIS — I25119 Atherosclerotic heart disease of native coronary artery with unspecified angina pectoris: Secondary | ICD-10-CM | POA: Diagnosis not present

## 2016-12-29 DIAGNOSIS — J449 Chronic obstructive pulmonary disease, unspecified: Secondary | ICD-10-CM | POA: Diagnosis not present

## 2016-12-29 DIAGNOSIS — C3490 Malignant neoplasm of unspecified part of unspecified bronchus or lung: Secondary | ICD-10-CM | POA: Diagnosis not present

## 2016-12-29 DIAGNOSIS — I4891 Unspecified atrial fibrillation: Secondary | ICD-10-CM | POA: Diagnosis not present

## 2016-12-29 DIAGNOSIS — E1159 Type 2 diabetes mellitus with other circulatory complications: Secondary | ICD-10-CM | POA: Diagnosis not present

## 2016-12-29 DIAGNOSIS — I679 Cerebrovascular disease, unspecified: Secondary | ICD-10-CM | POA: Diagnosis not present

## 2016-12-29 DIAGNOSIS — I25119 Atherosclerotic heart disease of native coronary artery with unspecified angina pectoris: Secondary | ICD-10-CM | POA: Diagnosis not present

## 2016-12-31 DIAGNOSIS — J449 Chronic obstructive pulmonary disease, unspecified: Secondary | ICD-10-CM | POA: Diagnosis not present

## 2016-12-31 DIAGNOSIS — C3490 Malignant neoplasm of unspecified part of unspecified bronchus or lung: Secondary | ICD-10-CM | POA: Diagnosis not present

## 2016-12-31 DIAGNOSIS — I679 Cerebrovascular disease, unspecified: Secondary | ICD-10-CM | POA: Diagnosis not present

## 2016-12-31 DIAGNOSIS — I25119 Atherosclerotic heart disease of native coronary artery with unspecified angina pectoris: Secondary | ICD-10-CM | POA: Diagnosis not present

## 2016-12-31 DIAGNOSIS — E1159 Type 2 diabetes mellitus with other circulatory complications: Secondary | ICD-10-CM | POA: Diagnosis not present

## 2016-12-31 DIAGNOSIS — I4891 Unspecified atrial fibrillation: Secondary | ICD-10-CM | POA: Diagnosis not present

## 2017-01-02 DIAGNOSIS — I4891 Unspecified atrial fibrillation: Secondary | ICD-10-CM | POA: Diagnosis not present

## 2017-01-02 DIAGNOSIS — E1159 Type 2 diabetes mellitus with other circulatory complications: Secondary | ICD-10-CM | POA: Diagnosis not present

## 2017-01-02 DIAGNOSIS — C3490 Malignant neoplasm of unspecified part of unspecified bronchus or lung: Secondary | ICD-10-CM | POA: Diagnosis not present

## 2017-01-02 DIAGNOSIS — I25119 Atherosclerotic heart disease of native coronary artery with unspecified angina pectoris: Secondary | ICD-10-CM | POA: Diagnosis not present

## 2017-01-02 DIAGNOSIS — I679 Cerebrovascular disease, unspecified: Secondary | ICD-10-CM | POA: Diagnosis not present

## 2017-01-02 DIAGNOSIS — J449 Chronic obstructive pulmonary disease, unspecified: Secondary | ICD-10-CM | POA: Diagnosis not present

## 2017-01-03 DIAGNOSIS — I509 Heart failure, unspecified: Secondary | ICD-10-CM | POA: Diagnosis not present

## 2017-01-03 DIAGNOSIS — I25119 Atherosclerotic heart disease of native coronary artery with unspecified angina pectoris: Secondary | ICD-10-CM | POA: Diagnosis not present

## 2017-01-03 DIAGNOSIS — H409 Unspecified glaucoma: Secondary | ICD-10-CM | POA: Diagnosis not present

## 2017-01-03 DIAGNOSIS — D638 Anemia in other chronic diseases classified elsewhere: Secondary | ICD-10-CM | POA: Diagnosis not present

## 2017-01-03 DIAGNOSIS — I10 Essential (primary) hypertension: Secondary | ICD-10-CM | POA: Diagnosis not present

## 2017-01-03 DIAGNOSIS — K219 Gastro-esophageal reflux disease without esophagitis: Secondary | ICD-10-CM | POA: Diagnosis not present

## 2017-01-03 DIAGNOSIS — M109 Gout, unspecified: Secondary | ICD-10-CM | POA: Diagnosis not present

## 2017-01-03 DIAGNOSIS — E1159 Type 2 diabetes mellitus with other circulatory complications: Secondary | ICD-10-CM | POA: Diagnosis not present

## 2017-01-03 DIAGNOSIS — J449 Chronic obstructive pulmonary disease, unspecified: Secondary | ICD-10-CM | POA: Diagnosis not present

## 2017-01-03 DIAGNOSIS — E46 Unspecified protein-calorie malnutrition: Secondary | ICD-10-CM | POA: Diagnosis not present

## 2017-01-03 DIAGNOSIS — C3490 Malignant neoplasm of unspecified part of unspecified bronchus or lung: Secondary | ICD-10-CM | POA: Diagnosis not present

## 2017-01-03 DIAGNOSIS — N184 Chronic kidney disease, stage 4 (severe): Secondary | ICD-10-CM | POA: Diagnosis not present

## 2017-01-03 DIAGNOSIS — I679 Cerebrovascular disease, unspecified: Secondary | ICD-10-CM | POA: Diagnosis not present

## 2017-01-03 DIAGNOSIS — F339 Major depressive disorder, recurrent, unspecified: Secondary | ICD-10-CM | POA: Diagnosis not present

## 2017-01-03 DIAGNOSIS — I4891 Unspecified atrial fibrillation: Secondary | ICD-10-CM | POA: Diagnosis not present

## 2017-01-03 DIAGNOSIS — E039 Hypothyroidism, unspecified: Secondary | ICD-10-CM | POA: Diagnosis not present

## 2017-01-07 DIAGNOSIS — I4891 Unspecified atrial fibrillation: Secondary | ICD-10-CM | POA: Diagnosis not present

## 2017-01-07 DIAGNOSIS — I25119 Atherosclerotic heart disease of native coronary artery with unspecified angina pectoris: Secondary | ICD-10-CM | POA: Diagnosis not present

## 2017-01-07 DIAGNOSIS — J449 Chronic obstructive pulmonary disease, unspecified: Secondary | ICD-10-CM | POA: Diagnosis not present

## 2017-01-07 DIAGNOSIS — E1159 Type 2 diabetes mellitus with other circulatory complications: Secondary | ICD-10-CM | POA: Diagnosis not present

## 2017-01-07 DIAGNOSIS — I679 Cerebrovascular disease, unspecified: Secondary | ICD-10-CM | POA: Diagnosis not present

## 2017-01-07 DIAGNOSIS — C3490 Malignant neoplasm of unspecified part of unspecified bronchus or lung: Secondary | ICD-10-CM | POA: Diagnosis not present

## 2017-01-08 DIAGNOSIS — E1159 Type 2 diabetes mellitus with other circulatory complications: Secondary | ICD-10-CM | POA: Diagnosis not present

## 2017-01-08 DIAGNOSIS — I25119 Atherosclerotic heart disease of native coronary artery with unspecified angina pectoris: Secondary | ICD-10-CM | POA: Diagnosis not present

## 2017-01-08 DIAGNOSIS — I679 Cerebrovascular disease, unspecified: Secondary | ICD-10-CM | POA: Diagnosis not present

## 2017-01-08 DIAGNOSIS — I4891 Unspecified atrial fibrillation: Secondary | ICD-10-CM | POA: Diagnosis not present

## 2017-01-08 DIAGNOSIS — C3490 Malignant neoplasm of unspecified part of unspecified bronchus or lung: Secondary | ICD-10-CM | POA: Diagnosis not present

## 2017-01-08 DIAGNOSIS — J449 Chronic obstructive pulmonary disease, unspecified: Secondary | ICD-10-CM | POA: Diagnosis not present

## 2017-01-09 DIAGNOSIS — J449 Chronic obstructive pulmonary disease, unspecified: Secondary | ICD-10-CM | POA: Diagnosis not present

## 2017-01-09 DIAGNOSIS — I679 Cerebrovascular disease, unspecified: Secondary | ICD-10-CM | POA: Diagnosis not present

## 2017-01-09 DIAGNOSIS — I4891 Unspecified atrial fibrillation: Secondary | ICD-10-CM | POA: Diagnosis not present

## 2017-01-09 DIAGNOSIS — I25119 Atherosclerotic heart disease of native coronary artery with unspecified angina pectoris: Secondary | ICD-10-CM | POA: Diagnosis not present

## 2017-01-09 DIAGNOSIS — E1159 Type 2 diabetes mellitus with other circulatory complications: Secondary | ICD-10-CM | POA: Diagnosis not present

## 2017-01-09 DIAGNOSIS — C3490 Malignant neoplasm of unspecified part of unspecified bronchus or lung: Secondary | ICD-10-CM | POA: Diagnosis not present

## 2017-01-12 DIAGNOSIS — I25119 Atherosclerotic heart disease of native coronary artery with unspecified angina pectoris: Secondary | ICD-10-CM | POA: Diagnosis not present

## 2017-01-12 DIAGNOSIS — E1159 Type 2 diabetes mellitus with other circulatory complications: Secondary | ICD-10-CM | POA: Diagnosis not present

## 2017-01-12 DIAGNOSIS — I679 Cerebrovascular disease, unspecified: Secondary | ICD-10-CM | POA: Diagnosis not present

## 2017-01-12 DIAGNOSIS — C3490 Malignant neoplasm of unspecified part of unspecified bronchus or lung: Secondary | ICD-10-CM | POA: Diagnosis not present

## 2017-01-12 DIAGNOSIS — J449 Chronic obstructive pulmonary disease, unspecified: Secondary | ICD-10-CM | POA: Diagnosis not present

## 2017-01-12 DIAGNOSIS — I4891 Unspecified atrial fibrillation: Secondary | ICD-10-CM | POA: Diagnosis not present

## 2017-01-14 DIAGNOSIS — E1159 Type 2 diabetes mellitus with other circulatory complications: Secondary | ICD-10-CM | POA: Diagnosis not present

## 2017-01-14 DIAGNOSIS — I679 Cerebrovascular disease, unspecified: Secondary | ICD-10-CM | POA: Diagnosis not present

## 2017-01-14 DIAGNOSIS — J449 Chronic obstructive pulmonary disease, unspecified: Secondary | ICD-10-CM | POA: Diagnosis not present

## 2017-01-14 DIAGNOSIS — I4891 Unspecified atrial fibrillation: Secondary | ICD-10-CM | POA: Diagnosis not present

## 2017-01-14 DIAGNOSIS — C3490 Malignant neoplasm of unspecified part of unspecified bronchus or lung: Secondary | ICD-10-CM | POA: Diagnosis not present

## 2017-01-14 DIAGNOSIS — I25119 Atherosclerotic heart disease of native coronary artery with unspecified angina pectoris: Secondary | ICD-10-CM | POA: Diagnosis not present

## 2017-01-16 DIAGNOSIS — I25119 Atherosclerotic heart disease of native coronary artery with unspecified angina pectoris: Secondary | ICD-10-CM | POA: Diagnosis not present

## 2017-01-16 DIAGNOSIS — I679 Cerebrovascular disease, unspecified: Secondary | ICD-10-CM | POA: Diagnosis not present

## 2017-01-16 DIAGNOSIS — E1159 Type 2 diabetes mellitus with other circulatory complications: Secondary | ICD-10-CM | POA: Diagnosis not present

## 2017-01-16 DIAGNOSIS — I4891 Unspecified atrial fibrillation: Secondary | ICD-10-CM | POA: Diagnosis not present

## 2017-01-16 DIAGNOSIS — C3490 Malignant neoplasm of unspecified part of unspecified bronchus or lung: Secondary | ICD-10-CM | POA: Diagnosis not present

## 2017-01-16 DIAGNOSIS — J449 Chronic obstructive pulmonary disease, unspecified: Secondary | ICD-10-CM | POA: Diagnosis not present

## 2017-01-19 DIAGNOSIS — C3490 Malignant neoplasm of unspecified part of unspecified bronchus or lung: Secondary | ICD-10-CM | POA: Diagnosis not present

## 2017-01-19 DIAGNOSIS — I679 Cerebrovascular disease, unspecified: Secondary | ICD-10-CM | POA: Diagnosis not present

## 2017-01-19 DIAGNOSIS — J449 Chronic obstructive pulmonary disease, unspecified: Secondary | ICD-10-CM | POA: Diagnosis not present

## 2017-01-19 DIAGNOSIS — E1159 Type 2 diabetes mellitus with other circulatory complications: Secondary | ICD-10-CM | POA: Diagnosis not present

## 2017-01-19 DIAGNOSIS — I4891 Unspecified atrial fibrillation: Secondary | ICD-10-CM | POA: Diagnosis not present

## 2017-01-19 DIAGNOSIS — I25119 Atherosclerotic heart disease of native coronary artery with unspecified angina pectoris: Secondary | ICD-10-CM | POA: Diagnosis not present

## 2017-01-21 DIAGNOSIS — I4891 Unspecified atrial fibrillation: Secondary | ICD-10-CM | POA: Diagnosis not present

## 2017-01-21 DIAGNOSIS — J449 Chronic obstructive pulmonary disease, unspecified: Secondary | ICD-10-CM | POA: Diagnosis not present

## 2017-01-21 DIAGNOSIS — I679 Cerebrovascular disease, unspecified: Secondary | ICD-10-CM | POA: Diagnosis not present

## 2017-01-21 DIAGNOSIS — C3490 Malignant neoplasm of unspecified part of unspecified bronchus or lung: Secondary | ICD-10-CM | POA: Diagnosis not present

## 2017-01-21 DIAGNOSIS — I25119 Atherosclerotic heart disease of native coronary artery with unspecified angina pectoris: Secondary | ICD-10-CM | POA: Diagnosis not present

## 2017-01-21 DIAGNOSIS — E1159 Type 2 diabetes mellitus with other circulatory complications: Secondary | ICD-10-CM | POA: Diagnosis not present

## 2017-01-23 DIAGNOSIS — I4891 Unspecified atrial fibrillation: Secondary | ICD-10-CM | POA: Diagnosis not present

## 2017-01-23 DIAGNOSIS — E1159 Type 2 diabetes mellitus with other circulatory complications: Secondary | ICD-10-CM | POA: Diagnosis not present

## 2017-01-23 DIAGNOSIS — I25119 Atherosclerotic heart disease of native coronary artery with unspecified angina pectoris: Secondary | ICD-10-CM | POA: Diagnosis not present

## 2017-01-23 DIAGNOSIS — J449 Chronic obstructive pulmonary disease, unspecified: Secondary | ICD-10-CM | POA: Diagnosis not present

## 2017-01-23 DIAGNOSIS — I679 Cerebrovascular disease, unspecified: Secondary | ICD-10-CM | POA: Diagnosis not present

## 2017-01-23 DIAGNOSIS — C3490 Malignant neoplasm of unspecified part of unspecified bronchus or lung: Secondary | ICD-10-CM | POA: Diagnosis not present

## 2017-01-26 DIAGNOSIS — E1159 Type 2 diabetes mellitus with other circulatory complications: Secondary | ICD-10-CM | POA: Diagnosis not present

## 2017-01-26 DIAGNOSIS — I25119 Atherosclerotic heart disease of native coronary artery with unspecified angina pectoris: Secondary | ICD-10-CM | POA: Diagnosis not present

## 2017-01-26 DIAGNOSIS — J449 Chronic obstructive pulmonary disease, unspecified: Secondary | ICD-10-CM | POA: Diagnosis not present

## 2017-01-26 DIAGNOSIS — I4891 Unspecified atrial fibrillation: Secondary | ICD-10-CM | POA: Diagnosis not present

## 2017-01-26 DIAGNOSIS — I679 Cerebrovascular disease, unspecified: Secondary | ICD-10-CM | POA: Diagnosis not present

## 2017-01-26 DIAGNOSIS — C3490 Malignant neoplasm of unspecified part of unspecified bronchus or lung: Secondary | ICD-10-CM | POA: Diagnosis not present

## 2017-01-28 DIAGNOSIS — L84 Corns and callosities: Secondary | ICD-10-CM | POA: Diagnosis not present

## 2017-01-28 DIAGNOSIS — I25119 Atherosclerotic heart disease of native coronary artery with unspecified angina pectoris: Secondary | ICD-10-CM | POA: Diagnosis not present

## 2017-01-28 DIAGNOSIS — J449 Chronic obstructive pulmonary disease, unspecified: Secondary | ICD-10-CM | POA: Diagnosis not present

## 2017-01-28 DIAGNOSIS — E1351 Other specified diabetes mellitus with diabetic peripheral angiopathy without gangrene: Secondary | ICD-10-CM | POA: Diagnosis not present

## 2017-01-28 DIAGNOSIS — E1159 Type 2 diabetes mellitus with other circulatory complications: Secondary | ICD-10-CM | POA: Diagnosis not present

## 2017-01-28 DIAGNOSIS — I679 Cerebrovascular disease, unspecified: Secondary | ICD-10-CM | POA: Diagnosis not present

## 2017-01-28 DIAGNOSIS — I4891 Unspecified atrial fibrillation: Secondary | ICD-10-CM | POA: Diagnosis not present

## 2017-01-28 DIAGNOSIS — L602 Onychogryphosis: Secondary | ICD-10-CM | POA: Diagnosis not present

## 2017-01-28 DIAGNOSIS — C3490 Malignant neoplasm of unspecified part of unspecified bronchus or lung: Secondary | ICD-10-CM | POA: Diagnosis not present

## 2017-01-29 DIAGNOSIS — I4891 Unspecified atrial fibrillation: Secondary | ICD-10-CM | POA: Diagnosis not present

## 2017-01-29 DIAGNOSIS — E1159 Type 2 diabetes mellitus with other circulatory complications: Secondary | ICD-10-CM | POA: Diagnosis not present

## 2017-01-29 DIAGNOSIS — I679 Cerebrovascular disease, unspecified: Secondary | ICD-10-CM | POA: Diagnosis not present

## 2017-01-29 DIAGNOSIS — J449 Chronic obstructive pulmonary disease, unspecified: Secondary | ICD-10-CM | POA: Diagnosis not present

## 2017-01-29 DIAGNOSIS — C3490 Malignant neoplasm of unspecified part of unspecified bronchus or lung: Secondary | ICD-10-CM | POA: Diagnosis not present

## 2017-01-29 DIAGNOSIS — I25119 Atherosclerotic heart disease of native coronary artery with unspecified angina pectoris: Secondary | ICD-10-CM | POA: Diagnosis not present

## 2017-02-02 DIAGNOSIS — I679 Cerebrovascular disease, unspecified: Secondary | ICD-10-CM | POA: Diagnosis not present

## 2017-02-02 DIAGNOSIS — F339 Major depressive disorder, recurrent, unspecified: Secondary | ICD-10-CM | POA: Diagnosis not present

## 2017-02-02 DIAGNOSIS — C3490 Malignant neoplasm of unspecified part of unspecified bronchus or lung: Secondary | ICD-10-CM | POA: Diagnosis not present

## 2017-02-02 DIAGNOSIS — E039 Hypothyroidism, unspecified: Secondary | ICD-10-CM | POA: Diagnosis not present

## 2017-02-02 DIAGNOSIS — E1159 Type 2 diabetes mellitus with other circulatory complications: Secondary | ICD-10-CM | POA: Diagnosis not present

## 2017-02-02 DIAGNOSIS — I10 Essential (primary) hypertension: Secondary | ICD-10-CM | POA: Diagnosis not present

## 2017-02-02 DIAGNOSIS — E46 Unspecified protein-calorie malnutrition: Secondary | ICD-10-CM | POA: Diagnosis not present

## 2017-02-02 DIAGNOSIS — I4891 Unspecified atrial fibrillation: Secondary | ICD-10-CM | POA: Diagnosis not present

## 2017-02-02 DIAGNOSIS — J449 Chronic obstructive pulmonary disease, unspecified: Secondary | ICD-10-CM | POA: Diagnosis not present

## 2017-02-02 DIAGNOSIS — D638 Anemia in other chronic diseases classified elsewhere: Secondary | ICD-10-CM | POA: Diagnosis not present

## 2017-02-02 DIAGNOSIS — N184 Chronic kidney disease, stage 4 (severe): Secondary | ICD-10-CM | POA: Diagnosis not present

## 2017-02-02 DIAGNOSIS — I509 Heart failure, unspecified: Secondary | ICD-10-CM | POA: Diagnosis not present

## 2017-02-02 DIAGNOSIS — H409 Unspecified glaucoma: Secondary | ICD-10-CM | POA: Diagnosis not present

## 2017-02-02 DIAGNOSIS — M109 Gout, unspecified: Secondary | ICD-10-CM | POA: Diagnosis not present

## 2017-02-02 DIAGNOSIS — K219 Gastro-esophageal reflux disease without esophagitis: Secondary | ICD-10-CM | POA: Diagnosis not present

## 2017-02-02 DIAGNOSIS — I25119 Atherosclerotic heart disease of native coronary artery with unspecified angina pectoris: Secondary | ICD-10-CM | POA: Diagnosis not present

## 2017-02-03 DIAGNOSIS — E1159 Type 2 diabetes mellitus with other circulatory complications: Secondary | ICD-10-CM | POA: Diagnosis not present

## 2017-02-03 DIAGNOSIS — I4891 Unspecified atrial fibrillation: Secondary | ICD-10-CM | POA: Diagnosis not present

## 2017-02-03 DIAGNOSIS — J449 Chronic obstructive pulmonary disease, unspecified: Secondary | ICD-10-CM | POA: Diagnosis not present

## 2017-02-03 DIAGNOSIS — I25119 Atherosclerotic heart disease of native coronary artery with unspecified angina pectoris: Secondary | ICD-10-CM | POA: Diagnosis not present

## 2017-02-03 DIAGNOSIS — C3490 Malignant neoplasm of unspecified part of unspecified bronchus or lung: Secondary | ICD-10-CM | POA: Diagnosis not present

## 2017-02-03 DIAGNOSIS — I679 Cerebrovascular disease, unspecified: Secondary | ICD-10-CM | POA: Diagnosis not present

## 2017-02-04 DIAGNOSIS — E1159 Type 2 diabetes mellitus with other circulatory complications: Secondary | ICD-10-CM | POA: Diagnosis not present

## 2017-02-04 DIAGNOSIS — J449 Chronic obstructive pulmonary disease, unspecified: Secondary | ICD-10-CM | POA: Diagnosis not present

## 2017-02-04 DIAGNOSIS — I679 Cerebrovascular disease, unspecified: Secondary | ICD-10-CM | POA: Diagnosis not present

## 2017-02-04 DIAGNOSIS — I4891 Unspecified atrial fibrillation: Secondary | ICD-10-CM | POA: Diagnosis not present

## 2017-02-04 DIAGNOSIS — C3490 Malignant neoplasm of unspecified part of unspecified bronchus or lung: Secondary | ICD-10-CM | POA: Diagnosis not present

## 2017-02-04 DIAGNOSIS — I25119 Atherosclerotic heart disease of native coronary artery with unspecified angina pectoris: Secondary | ICD-10-CM | POA: Diagnosis not present

## 2017-02-05 DIAGNOSIS — Z23 Encounter for immunization: Secondary | ICD-10-CM | POA: Diagnosis not present

## 2017-02-06 DIAGNOSIS — I4891 Unspecified atrial fibrillation: Secondary | ICD-10-CM | POA: Diagnosis not present

## 2017-02-06 DIAGNOSIS — I679 Cerebrovascular disease, unspecified: Secondary | ICD-10-CM | POA: Diagnosis not present

## 2017-02-06 DIAGNOSIS — I25119 Atherosclerotic heart disease of native coronary artery with unspecified angina pectoris: Secondary | ICD-10-CM | POA: Diagnosis not present

## 2017-02-06 DIAGNOSIS — E1159 Type 2 diabetes mellitus with other circulatory complications: Secondary | ICD-10-CM | POA: Diagnosis not present

## 2017-02-06 DIAGNOSIS — J449 Chronic obstructive pulmonary disease, unspecified: Secondary | ICD-10-CM | POA: Diagnosis not present

## 2017-02-06 DIAGNOSIS — C3490 Malignant neoplasm of unspecified part of unspecified bronchus or lung: Secondary | ICD-10-CM | POA: Diagnosis not present

## 2017-02-09 DIAGNOSIS — C3490 Malignant neoplasm of unspecified part of unspecified bronchus or lung: Secondary | ICD-10-CM | POA: Diagnosis not present

## 2017-02-09 DIAGNOSIS — E1159 Type 2 diabetes mellitus with other circulatory complications: Secondary | ICD-10-CM | POA: Diagnosis not present

## 2017-02-09 DIAGNOSIS — I679 Cerebrovascular disease, unspecified: Secondary | ICD-10-CM | POA: Diagnosis not present

## 2017-02-09 DIAGNOSIS — I25119 Atherosclerotic heart disease of native coronary artery with unspecified angina pectoris: Secondary | ICD-10-CM | POA: Diagnosis not present

## 2017-02-09 DIAGNOSIS — J449 Chronic obstructive pulmonary disease, unspecified: Secondary | ICD-10-CM | POA: Diagnosis not present

## 2017-02-09 DIAGNOSIS — I4891 Unspecified atrial fibrillation: Secondary | ICD-10-CM | POA: Diagnosis not present

## 2017-02-10 DIAGNOSIS — I679 Cerebrovascular disease, unspecified: Secondary | ICD-10-CM | POA: Diagnosis not present

## 2017-02-10 DIAGNOSIS — I25119 Atherosclerotic heart disease of native coronary artery with unspecified angina pectoris: Secondary | ICD-10-CM | POA: Diagnosis not present

## 2017-02-10 DIAGNOSIS — E1159 Type 2 diabetes mellitus with other circulatory complications: Secondary | ICD-10-CM | POA: Diagnosis not present

## 2017-02-10 DIAGNOSIS — I4891 Unspecified atrial fibrillation: Secondary | ICD-10-CM | POA: Diagnosis not present

## 2017-02-10 DIAGNOSIS — J449 Chronic obstructive pulmonary disease, unspecified: Secondary | ICD-10-CM | POA: Diagnosis not present

## 2017-02-10 DIAGNOSIS — C3490 Malignant neoplasm of unspecified part of unspecified bronchus or lung: Secondary | ICD-10-CM | POA: Diagnosis not present

## 2017-02-11 ENCOUNTER — Ambulatory Visit (INDEPENDENT_AMBULATORY_CARE_PROVIDER_SITE_OTHER): Payer: Medicare Other | Admitting: Ophthalmology

## 2017-02-11 DIAGNOSIS — I4891 Unspecified atrial fibrillation: Secondary | ICD-10-CM | POA: Diagnosis not present

## 2017-02-11 DIAGNOSIS — E1159 Type 2 diabetes mellitus with other circulatory complications: Secondary | ICD-10-CM | POA: Diagnosis not present

## 2017-02-11 DIAGNOSIS — I25119 Atherosclerotic heart disease of native coronary artery with unspecified angina pectoris: Secondary | ICD-10-CM | POA: Diagnosis not present

## 2017-02-11 DIAGNOSIS — C3490 Malignant neoplasm of unspecified part of unspecified bronchus or lung: Secondary | ICD-10-CM | POA: Diagnosis not present

## 2017-02-11 DIAGNOSIS — I679 Cerebrovascular disease, unspecified: Secondary | ICD-10-CM | POA: Diagnosis not present

## 2017-02-11 DIAGNOSIS — J449 Chronic obstructive pulmonary disease, unspecified: Secondary | ICD-10-CM | POA: Diagnosis not present

## 2017-02-13 ENCOUNTER — Encounter (HOSPITAL_COMMUNITY): Payer: Self-pay

## 2017-02-13 ENCOUNTER — Emergency Department (HOSPITAL_COMMUNITY)
Admission: EM | Admit: 2017-02-13 | Discharge: 2017-02-13 | Disposition: A | Discharge status: Home / Self Care | Attending: Emergency Medicine | Admitting: Emergency Medicine

## 2017-02-13 DIAGNOSIS — E1122 Type 2 diabetes mellitus with diabetic chronic kidney disease: Secondary | ICD-10-CM | POA: Diagnosis not present

## 2017-02-13 DIAGNOSIS — E1159 Type 2 diabetes mellitus with other circulatory complications: Secondary | ICD-10-CM | POA: Diagnosis not present

## 2017-02-13 DIAGNOSIS — R404 Transient alteration of awareness: Secondary | ICD-10-CM | POA: Diagnosis not present

## 2017-02-13 DIAGNOSIS — I2510 Atherosclerotic heart disease of native coronary artery without angina pectoris: Secondary | ICD-10-CM | POA: Insufficient documentation

## 2017-02-13 DIAGNOSIS — Z794 Long term (current) use of insulin: Secondary | ICD-10-CM | POA: Insufficient documentation

## 2017-02-13 DIAGNOSIS — E860 Dehydration: Secondary | ICD-10-CM | POA: Insufficient documentation

## 2017-02-13 DIAGNOSIS — J449 Chronic obstructive pulmonary disease, unspecified: Secondary | ICD-10-CM | POA: Diagnosis not present

## 2017-02-13 DIAGNOSIS — Z79899 Other long term (current) drug therapy: Secondary | ICD-10-CM | POA: Insufficient documentation

## 2017-02-13 DIAGNOSIS — C3490 Malignant neoplasm of unspecified part of unspecified bronchus or lung: Secondary | ICD-10-CM | POA: Diagnosis not present

## 2017-02-13 DIAGNOSIS — R52 Pain, unspecified: Secondary | ICD-10-CM

## 2017-02-13 DIAGNOSIS — R000 Tachycardia, unspecified: Secondary | ICD-10-CM | POA: Diagnosis not present

## 2017-02-13 DIAGNOSIS — N184 Chronic kidney disease, stage 4 (severe): Secondary | ICD-10-CM | POA: Diagnosis not present

## 2017-02-13 DIAGNOSIS — Z87891 Personal history of nicotine dependence: Secondary | ICD-10-CM | POA: Insufficient documentation

## 2017-02-13 DIAGNOSIS — R531 Weakness: Secondary | ICD-10-CM | POA: Diagnosis not present

## 2017-02-13 DIAGNOSIS — I679 Cerebrovascular disease, unspecified: Secondary | ICD-10-CM | POA: Diagnosis not present

## 2017-02-13 DIAGNOSIS — I25119 Atherosclerotic heart disease of native coronary artery with unspecified angina pectoris: Secondary | ICD-10-CM | POA: Diagnosis not present

## 2017-02-13 DIAGNOSIS — Z7902 Long term (current) use of antithrombotics/antiplatelets: Secondary | ICD-10-CM | POA: Diagnosis not present

## 2017-02-13 DIAGNOSIS — Z955 Presence of coronary angioplasty implant and graft: Secondary | ICD-10-CM | POA: Insufficient documentation

## 2017-02-13 DIAGNOSIS — I129 Hypertensive chronic kidney disease with stage 1 through stage 4 chronic kidney disease, or unspecified chronic kidney disease: Secondary | ICD-10-CM | POA: Diagnosis not present

## 2017-02-13 DIAGNOSIS — I4891 Unspecified atrial fibrillation: Secondary | ICD-10-CM | POA: Diagnosis not present

## 2017-02-13 MED ORDER — SODIUM CHLORIDE 0.9 % IV BOLUS (SEPSIS)
1000.0000 mL | Freq: Once | INTRAVENOUS | Status: AC
  Administered 2017-02-13: 1000 mL via INTRAVENOUS

## 2017-02-13 MED ORDER — ONDANSETRON HCL 4 MG/2ML IJ SOLN
4.0000 mg | Freq: Once | INTRAMUSCULAR | Status: AC
  Administered 2017-02-13: 4 mg via INTRAVENOUS
  Filled 2017-02-13: qty 2

## 2017-02-13 MED ORDER — MORPHINE SULFATE (PF) 2 MG/ML IV SOLN
2.0000 mg | INTRAVENOUS | Status: DC | PRN
Start: 2017-02-13 — End: 2017-02-13
  Administered 2017-02-13: 2 mg via INTRAVENOUS
  Filled 2017-02-13: qty 1

## 2017-02-13 NOTE — Discharge Instructions (Signed)
Have Erica Jimenez get rechecked if she has any uncontrolled symptoms or new concerns.

## 2017-02-13 NOTE — ED Triage Notes (Signed)
Patient arrives by EMS with complaints of Stage IV lung cancer,generalized weakness and right arm pain from the lung cancer. Patient fell, feeling sick and trying to get to her walker and fallen at the side of the bed-no injuries from the fall. BP 116/84 HR 68 RR 16 98% RA CBG 209. Patient is a hospice patient-daughter called hospice and hospice nurse stated to take her to the ED.

## 2017-02-13 NOTE — ED Notes (Signed)
Bed: QM25 Expected date:  Expected time:  Means of arrival:  Comments: EMS 83 yo female stage IV lung cancer/weak and fell-pain-unable to eat

## 2017-02-13 NOTE — ED Provider Notes (Signed)
[facility] DEPT Provider Note   CSN: [id number] Arrival date & time: 02/13/17  0114     History   Chief Complaint Chief Complaint  Patient presents with  . Stage IV lung cancer  . Weakness  . Fall    HPI Erica Jimenez is a 83 y.o. female.  The history is provided by the patient and a relative. No language interpreter was used.  Weakness   Fall    Erica Jimenez is a 83 y.o. female who presents to the Emergency Department complaining of fall, weakness. He presents from home following a fall earlier today. She has stage IV lung cancer and is on home hospice since May. Over the last week she has had poor oral intake with upper abdominal pain. She was getting up to go to the bathroom to vomit when she fell. She denies any injuries related to the fall but endorses pain across her upper abdomen and bilateral arms. History is provided by the patient's daughter.  Symptoms are severe and constant in nature. Past Medical History:  Diagnosis Date  . A-V fistula ([place])    pt has 2  fistulas in rt arm  . Alopecia   . Anemia   . Anxiety   . Aortic stenosis   . Arthritis    gout  . Bladder cancer [facility])     s/p resection and BCG treatment.   Marland Kitchen CAD (coronary artery disease)     Pt is s/p anterior MI in 1996 with stent placed in LAD.  Last myoview in our office was in 7/05 and  showed apical anterior, apical, and apical inferior infarct.  Minimal peri-infarct ischemia.  EF 49%.   . Cardiomyopathy, ischemic   . CKD (chronic kidney disease)     Pt is stage III-IV.  She had a fistula placed but is not on dialysis.  She follows with Dr. Lorrene Reid for  nephrology.   Marland Kitchen COPD (chronic obstructive pulmonary disease) ([place])   . CVA (cerebral vascular accident) ([place])   . DM (diabetes mellitus) ([place])   . Encounter for antineoplastic immunotherapy 11/04/2016  . Full dentures   . Gout   . History of glaucoma   . HOH (hard of hearing)    slightly  . HTN (hypertension)   . Hyperlipidemia   .  Hypothyroidism   . Myocardial infarction ([place])   . PAD (peripheral artery disease) ([facility])     arterial doppler study 12/04 suggestive of > 50% bilateral SFA stenosis.  Pt has mild claudication.  No invasive evaluation given CKD and desire to avoid contrast use.   Marland Kitchen Shortness of breath dyspnea    on exertion  . Wears glasses     Patient Active Problem List   Diagnosis Date Noted  . Encounter for antineoplastic immunotherapy 11/04/2016  . DVT (deep vein thrombosis) in pregnancy ([place]) 10/07/2016  . Cerebral thrombosis with cerebral infarction 10/01/2016  . Right sided weakness 10/01/2016  . Bleeding acute gastric ulcer   . SOB (shortness of breath)   . Encounter for palliative care   . Malnutrition of moderate degree 09/02/2016  . Acute upper GI bleed   . Duodenal ulcer with hemorrhage   . Sepsis ([place]) 09/01/2016  . Goals of care, counseling/discussion 08/26/2016  . Acute cerebrovascular accident (CVA) ([place]) 08/16/2016  . Type 2 diabetes mellitus with hyperlipidemia ([place]) 08/16/2016  . CKD (chronic kidney disease), stage IV ([place]) 08/16/2016  . Anemia 08/16/2016  . Stage IV squamous cell carcinoma of right lung (  [place]) 08/14/2016  . Lung cancer ([place]) 08/12/2016  . Right lower lobe lung mass 07/21/2016  . Smoking 08/30/2010  . Aortic valve disorder 02/12/2009  . CARDIOMYOPATHY, ISCHEMIC 08/14/2008  . Hyperlipidemia 07/11/2008  . CAD, NATIVE VESSEL 07/11/2008  . BRADYCARDIA 07/11/2008  . Peripheral vascular disease ([place]) 07/11/2008  . CARDIAC MURMUR 07/11/2008  . CAROTID BRUIT 07/11/2008    Past Surgical History:  Procedure Laterality Date  . ABDOMINAL AORTAGRAM N/A 08/17/2013   Procedure: ABDOMINAL Maxcine Ham;  Surgeon: Wellington Hampshire, MD;  Location: [facility]ATH [facility];  Service: Cardiovascular;  Laterality: N/A;  . ANGIOPLASTY    . CYSTOSCOPY/RETROGRADE/URETEROSCOPY Bilateral 01/09/2015   Procedure: CYSTOSCOPY BILATERAL RETROGRADE PYELOGRAM, WASHINGS RENAL PELVIS & BLADDER ;  Surgeon:  Lowella Bandy, MD;  Location[facility];  Service: Urology;  Laterality: Bilateral;  . CYSTOSTOMY W/ BLADDER BIOPSY    . ESOPHAGOGASTRODUODENOSCOPY N/A 09/02/2016   Procedure: ESOPHAGOGASTRODUODENOSCOPY (EGD);  Surgeon: Jerene Bears, MD;  Location: [facility];  Service: Endoscopy;  Laterality: N/A;  . EYE SURGERY Bilateral    cataract w IOL    OB History    No data available       Home Medications    Prior to Admission medications   Medication Sig Start Date End Date Taking? Authorizing Provider  acetaminophen (TYLENOL) 325 MG tablet Take 650 mg by mouth every 6 (six) hours as needed for mild pain, moderate pain, fever or headache.    Yes [provider]  allopurinol (ZYLOPRIM) 100 MG tablet Take 1 tablet (100 mg total) by mouth daily. 11/04/16  Yes Medina-Vargas, Monina C, NP  atorvastatin (LIPITOR) 80 MG tablet Take 1 tablet (80 mg total) by mouth daily after supper. 11/04/16  Yes Medina-Vargas, Monina C, NP  bisoprolol (ZEBETA) 5 MG tablet Take 0.5 tablets (2.5 mg total) by mouth daily. 11/04/16  Yes Medina-Vargas, Monina C, NP  brinzolamide (AZOPT) 1 % ophthalmic suspension Place 1 drop into both eyes 2 (two) times daily. 11/04/16  Yes Medina-Vargas, Monina C, NP  Carboxymethylcellulose Sodium (REFRESH TEARS OP) Place 1 drop into both eyes 3 (three) times daily as needed (dry eyes).    Yes [provider]  cetirizine (ZYRTEC) 10 MG tablet Take 10 mg by mouth at bedtime. 10/31/16 02/13/17 Yes [provider]  citalopram (CELEXA) 10 MG tablet Take 2 tablets (20 mg total) by mouth daily after supper. 11/04/16  Yes Medina-Vargas, Monina C, NP  clopidogrel (PLAVIX) 75 MG tablet Take 1 tablet (75 mg total) by mouth daily. 11/04/16  Yes Medina-Vargas, Monina C, NP  ENSURE (ENSURE) Take 237 mLs by mouth 2 (two) times daily.   Yes [provider]  ferrous sulfate 325 (65 FE) MG tablet Take 325 mg by mouth daily. After supper   Yes [provider]    folic acid (FOLVITE) 1 MG tablet Take 1 mg by mouth daily.   Yes [provider]  furosemide (LASIX) 40 MG tablet Take 1 tablet (40 mg total) by mouth daily. 11/04/16  Yes Medina-Vargas, Monina C, NP  gabapentin (NEURONTIN) 100 MG capsule Take 1 capsule (100 mg total) by mouth every 8 (eight) hours. 11/04/16  Yes Medina-Vargas, Monina C, NP  hydrALAZINE (APRESOLINE) 50 MG tablet Take 1 tablet (50 mg total) by mouth 2 (two) times daily. 11/04/16  Yes Medina-Vargas, Monina C, NP  insulin glargine (LANTUS) 100 unit/mL SOPN Inject 0.1 mLs (10 Units total) into the skin daily before breakfast. 11/04/16  Yes Medina-Vargas, Monina C, NP  insulin  lispro (HUMALOG) 100 UNIT/ML injection Inject 0.02-0.04 mLs (2-4 Units total) into the skin daily as needed for high blood sugar. Post lunch only 151-299 = 2, >300 = 4 11/04/16  Yes Medina-Vargas, Monina C, NP  isosorbide mononitrate (IMDUR) 60 MG 24 hr tablet Take 1 tablet (60 mg total) by mouth daily after supper. 11/04/16  Yes Medina-Vargas, Monina C, NP  latanoprost (XALATAN) 0.005 % ophthalmic solution Place 1 drop into both eyes at bedtime. 11/04/16  Yes Medina-Vargas, Monina C, NP  levothyroxine (SYNTHROID, LEVOTHROID) 50 MCG tablet Take 1 tablet (50 mcg total) by mouth daily after supper. 11/04/16  Yes Medina-Vargas, Monina C, NP  lidocaine-prilocaine (EMLA) cream Apply 1 application topically as needed. 11/04/16  Yes Medina-Vargas, Monina C, NP  nitroGLYCERIN (NITROSTAT) 0.4 MG SL tablet Place 1 tablet (0.4 mg total) under the tongue every 5 (five) minutes as needed for chest pain. 11/04/16  Yes Medina-Vargas, Monina C, NP  oxyCODONE (OXY IR/ROXICODONE) 5 MG immediate release tablet Take 5-10 mg by mouth every 4 (four) hours as needed for breakthrough pain.  12/31/16  Yes [provider]  pantoprazole (PROTONIX) 40 MG tablet Take 1 tablet (40 mg total) by mouth 2 (two) times daily. 11/04/16  Yes Medina-Vargas, Monina C, NP  potassium chloride SA (K-DUR,KLOR-CON)  20 MEQ tablet Take 20 mEq by mouth 2 (two) times daily.   Yes [provider]  triamcinolone (NASACORT) 55 MCG/ACT AERO nasal inhaler Place 1 spray into the nose 2 (two) times daily. Aim toward ear, not straight up nose Patient taking differently: Place 1 spray into the nose daily as needed (for allergies). Aim toward ear, not straight up nose  11/04/16  Yes Medina-Vargas, Monina C, NP    Family History Family History  Problem Relation Age of Onset  . Breast cancer Mother   . Tuberculosis Father   . Heart disease Maternal Aunt     Social History Social History  Substance Use Topics  . Smoking status: Former Smoker    Packs/day: 0.35    Years: 60.00    Types: Cigarettes    Quit date: 06/30/2016  . Smokeless tobacco: Never Used  . Alcohol use 0.6 oz/week    1 Shots of liquor per week     Comment: occasional     Allergies   Contrast media [iodinated diagnostic agents]; Fluoxetine; and Sulfonamide derivatives   Review of Systems Review of Systems  Neurological: Positive for weakness.  All other systems reviewed and are negative.    Physical Exam Updated Vital Signs BP (!) 109/58   Pulse 69   Temp 98.4 F (36.9 C) (Oral)   Resp 13   SpO2 95%   Physical Exam  Constitutional: She is oriented to person, place, and time. She appears well-developed.  Frail, cachectic.  HENT:  Head: Normocephalic and atraumatic.  Dry mucous membranes  Cardiovascular: Regular rhythm.   No murmur heard. tachycardic  Pulmonary/Chest: Effort normal and breath sounds normal. No respiratory distress.  Abdominal: Soft. There is no tenderness. There is no rebound and no guarding.  Musculoskeletal: She exhibits no edema or tenderness.  Neurological: She is alert and oriented to person, place, and time.  Skin: Skin is warm and dry.  Psychiatric: She has a normal mood and affect. Her behavior is normal.  Nursing note and vitals reviewed.    ED Treatments / Results  Labs (all labs  ordered are listed, but only abnormal results are displayed) Labs Reviewed - No data to display  EKG  EKG Interpretation  Date/Time:  Friday February 13 2017 01:26:10 EDT Ventricular Rate:  142 PR Interval:    QRS Duration: 128 QT Interval:  338 QTC Calculation: 520 R Axis:   -110 Text Interpretation:  Wide-QRS tachycardia Right bundle branch block Confirmed by Quintella Reichert [phone]) on 02/13/2017 3:51:42 AM Also confirmed by Quintella Reichert [phone]), editor Laurena Spies [phone])  on 02/13/2017 7:12:54 AM       Radiology No results found.  Procedures Procedures (including critical care time)  Medications Ordered in ED Medications  morphine 2 MG/ML injection 2 mg (2 mg Intravenous Given 02/13/17 0318)  sodium chloride 0.9 % bolus 1,000 mL (0 mLs Intravenous Stopped 02/13/17 0330)  ondansetron (ZOFRAN) injection 4 mg (4 mg Intravenous Given 02/13/17 0142)     Initial Impression / Assessment and Plan / ED Course  I have reviewed the triage vital signs and the nursing notes.  Pertinent labs & imaging results that were available during my care of the patient were reviewed by me and considered in my medical decision making (see chart for details).    Patient with metastatic cancer on home hospice here with pain, dehydration, fall, vomiting.Patient and family declined laboratory testing but would like comfort measures. She was treated with antiemetics, pain control, IV fluids. On repeat assessment she is feeling significantly improved and her pain and vomiting have resolved. Plan to DC home in care of her daughter and home hospice. Patient and daughter are in agreement with plan.  Final Clinical Impressions(s) / ED Diagnoses   Final diagnoses:  Dehydration  Pain    New Prescriptions New Prescriptions   No medications on file     Quintella Reichert, MD 02/13/17 [phone]

## 2017-02-14 DIAGNOSIS — E1159 Type 2 diabetes mellitus with other circulatory complications: Secondary | ICD-10-CM | POA: Diagnosis not present

## 2017-02-14 DIAGNOSIS — J449 Chronic obstructive pulmonary disease, unspecified: Secondary | ICD-10-CM | POA: Diagnosis not present

## 2017-02-14 DIAGNOSIS — I4891 Unspecified atrial fibrillation: Secondary | ICD-10-CM | POA: Diagnosis not present

## 2017-02-14 DIAGNOSIS — I679 Cerebrovascular disease, unspecified: Secondary | ICD-10-CM | POA: Diagnosis not present

## 2017-02-14 DIAGNOSIS — C3490 Malignant neoplasm of unspecified part of unspecified bronchus or lung: Secondary | ICD-10-CM | POA: Diagnosis not present

## 2017-02-14 DIAGNOSIS — I25119 Atherosclerotic heart disease of native coronary artery with unspecified angina pectoris: Secondary | ICD-10-CM | POA: Diagnosis not present

## 2017-02-16 DIAGNOSIS — I679 Cerebrovascular disease, unspecified: Secondary | ICD-10-CM | POA: Diagnosis not present

## 2017-02-16 DIAGNOSIS — C3490 Malignant neoplasm of unspecified part of unspecified bronchus or lung: Secondary | ICD-10-CM | POA: Diagnosis not present

## 2017-02-16 DIAGNOSIS — J449 Chronic obstructive pulmonary disease, unspecified: Secondary | ICD-10-CM | POA: Diagnosis not present

## 2017-02-16 DIAGNOSIS — E1159 Type 2 diabetes mellitus with other circulatory complications: Secondary | ICD-10-CM | POA: Diagnosis not present

## 2017-02-16 DIAGNOSIS — I25119 Atherosclerotic heart disease of native coronary artery with unspecified angina pectoris: Secondary | ICD-10-CM | POA: Diagnosis not present

## 2017-02-16 DIAGNOSIS — I4891 Unspecified atrial fibrillation: Secondary | ICD-10-CM | POA: Diagnosis not present

## 2017-02-18 DIAGNOSIS — I25119 Atherosclerotic heart disease of native coronary artery with unspecified angina pectoris: Secondary | ICD-10-CM | POA: Diagnosis not present

## 2017-02-18 DIAGNOSIS — E1159 Type 2 diabetes mellitus with other circulatory complications: Secondary | ICD-10-CM | POA: Diagnosis not present

## 2017-02-18 DIAGNOSIS — C3490 Malignant neoplasm of unspecified part of unspecified bronchus or lung: Secondary | ICD-10-CM | POA: Diagnosis not present

## 2017-02-18 DIAGNOSIS — J449 Chronic obstructive pulmonary disease, unspecified: Secondary | ICD-10-CM | POA: Diagnosis not present

## 2017-02-18 DIAGNOSIS — I679 Cerebrovascular disease, unspecified: Secondary | ICD-10-CM | POA: Diagnosis not present

## 2017-02-18 DIAGNOSIS — I4891 Unspecified atrial fibrillation: Secondary | ICD-10-CM | POA: Diagnosis not present

## 2017-02-19 DIAGNOSIS — J449 Chronic obstructive pulmonary disease, unspecified: Secondary | ICD-10-CM | POA: Diagnosis not present

## 2017-02-19 DIAGNOSIS — E1159 Type 2 diabetes mellitus with other circulatory complications: Secondary | ICD-10-CM | POA: Diagnosis not present

## 2017-02-19 DIAGNOSIS — I4891 Unspecified atrial fibrillation: Secondary | ICD-10-CM | POA: Diagnosis not present

## 2017-02-19 DIAGNOSIS — C3490 Malignant neoplasm of unspecified part of unspecified bronchus or lung: Secondary | ICD-10-CM | POA: Diagnosis not present

## 2017-02-19 DIAGNOSIS — I679 Cerebrovascular disease, unspecified: Secondary | ICD-10-CM | POA: Diagnosis not present

## 2017-02-19 DIAGNOSIS — I25119 Atherosclerotic heart disease of native coronary artery with unspecified angina pectoris: Secondary | ICD-10-CM | POA: Diagnosis not present

## 2017-02-20 DIAGNOSIS — I4891 Unspecified atrial fibrillation: Secondary | ICD-10-CM | POA: Diagnosis not present

## 2017-02-20 DIAGNOSIS — J449 Chronic obstructive pulmonary disease, unspecified: Secondary | ICD-10-CM | POA: Diagnosis not present

## 2017-02-20 DIAGNOSIS — E1159 Type 2 diabetes mellitus with other circulatory complications: Secondary | ICD-10-CM | POA: Diagnosis not present

## 2017-02-20 DIAGNOSIS — C3490 Malignant neoplasm of unspecified part of unspecified bronchus or lung: Secondary | ICD-10-CM | POA: Diagnosis not present

## 2017-02-20 DIAGNOSIS — I25119 Atherosclerotic heart disease of native coronary artery with unspecified angina pectoris: Secondary | ICD-10-CM | POA: Diagnosis not present

## 2017-02-20 DIAGNOSIS — I679 Cerebrovascular disease, unspecified: Secondary | ICD-10-CM | POA: Diagnosis not present

## 2017-02-23 DIAGNOSIS — I4891 Unspecified atrial fibrillation: Secondary | ICD-10-CM | POA: Diagnosis not present

## 2017-02-23 DIAGNOSIS — J449 Chronic obstructive pulmonary disease, unspecified: Secondary | ICD-10-CM | POA: Diagnosis not present

## 2017-02-23 DIAGNOSIS — C3490 Malignant neoplasm of unspecified part of unspecified bronchus or lung: Secondary | ICD-10-CM | POA: Diagnosis not present

## 2017-02-23 DIAGNOSIS — E1159 Type 2 diabetes mellitus with other circulatory complications: Secondary | ICD-10-CM | POA: Diagnosis not present

## 2017-02-23 DIAGNOSIS — I679 Cerebrovascular disease, unspecified: Secondary | ICD-10-CM | POA: Diagnosis not present

## 2017-02-23 DIAGNOSIS — I25119 Atherosclerotic heart disease of native coronary artery with unspecified angina pectoris: Secondary | ICD-10-CM | POA: Diagnosis not present

## 2017-02-24 DIAGNOSIS — C3490 Malignant neoplasm of unspecified part of unspecified bronchus or lung: Secondary | ICD-10-CM | POA: Diagnosis not present

## 2017-02-24 DIAGNOSIS — I679 Cerebrovascular disease, unspecified: Secondary | ICD-10-CM | POA: Diagnosis not present

## 2017-02-24 DIAGNOSIS — I25119 Atherosclerotic heart disease of native coronary artery with unspecified angina pectoris: Secondary | ICD-10-CM | POA: Diagnosis not present

## 2017-02-24 DIAGNOSIS — E1159 Type 2 diabetes mellitus with other circulatory complications: Secondary | ICD-10-CM | POA: Diagnosis not present

## 2017-02-24 DIAGNOSIS — I4891 Unspecified atrial fibrillation: Secondary | ICD-10-CM | POA: Diagnosis not present

## 2017-02-24 DIAGNOSIS — J449 Chronic obstructive pulmonary disease, unspecified: Secondary | ICD-10-CM | POA: Diagnosis not present

## 2017-02-25 DIAGNOSIS — I679 Cerebrovascular disease, unspecified: Secondary | ICD-10-CM | POA: Diagnosis not present

## 2017-02-25 DIAGNOSIS — C3490 Malignant neoplasm of unspecified part of unspecified bronchus or lung: Secondary | ICD-10-CM | POA: Diagnosis not present

## 2017-02-25 DIAGNOSIS — I25119 Atherosclerotic heart disease of native coronary artery with unspecified angina pectoris: Secondary | ICD-10-CM | POA: Diagnosis not present

## 2017-02-25 DIAGNOSIS — E1159 Type 2 diabetes mellitus with other circulatory complications: Secondary | ICD-10-CM | POA: Diagnosis not present

## 2017-02-25 DIAGNOSIS — J449 Chronic obstructive pulmonary disease, unspecified: Secondary | ICD-10-CM | POA: Diagnosis not present

## 2017-02-25 DIAGNOSIS — I4891 Unspecified atrial fibrillation: Secondary | ICD-10-CM | POA: Diagnosis not present

## 2017-02-26 DIAGNOSIS — I4891 Unspecified atrial fibrillation: Secondary | ICD-10-CM | POA: Diagnosis not present

## 2017-02-26 DIAGNOSIS — I679 Cerebrovascular disease, unspecified: Secondary | ICD-10-CM | POA: Diagnosis not present

## 2017-02-26 DIAGNOSIS — E1159 Type 2 diabetes mellitus with other circulatory complications: Secondary | ICD-10-CM | POA: Diagnosis not present

## 2017-02-26 DIAGNOSIS — J449 Chronic obstructive pulmonary disease, unspecified: Secondary | ICD-10-CM | POA: Diagnosis not present

## 2017-02-26 DIAGNOSIS — C3490 Malignant neoplasm of unspecified part of unspecified bronchus or lung: Secondary | ICD-10-CM | POA: Diagnosis not present

## 2017-02-26 DIAGNOSIS — I25119 Atherosclerotic heart disease of native coronary artery with unspecified angina pectoris: Secondary | ICD-10-CM | POA: Diagnosis not present

## 2017-02-27 DIAGNOSIS — C3490 Malignant neoplasm of unspecified part of unspecified bronchus or lung: Secondary | ICD-10-CM | POA: Diagnosis not present

## 2017-02-27 DIAGNOSIS — E1159 Type 2 diabetes mellitus with other circulatory complications: Secondary | ICD-10-CM | POA: Diagnosis not present

## 2017-02-27 DIAGNOSIS — J449 Chronic obstructive pulmonary disease, unspecified: Secondary | ICD-10-CM | POA: Diagnosis not present

## 2017-02-27 DIAGNOSIS — I25119 Atherosclerotic heart disease of native coronary artery with unspecified angina pectoris: Secondary | ICD-10-CM | POA: Diagnosis not present

## 2017-02-27 DIAGNOSIS — I679 Cerebrovascular disease, unspecified: Secondary | ICD-10-CM | POA: Diagnosis not present

## 2017-02-27 DIAGNOSIS — I4891 Unspecified atrial fibrillation: Secondary | ICD-10-CM | POA: Diagnosis not present

## 2017-03-02 DIAGNOSIS — E1159 Type 2 diabetes mellitus with other circulatory complications: Secondary | ICD-10-CM | POA: Diagnosis not present

## 2017-03-02 DIAGNOSIS — I25119 Atherosclerotic heart disease of native coronary artery with unspecified angina pectoris: Secondary | ICD-10-CM | POA: Diagnosis not present

## 2017-03-02 DIAGNOSIS — I679 Cerebrovascular disease, unspecified: Secondary | ICD-10-CM | POA: Diagnosis not present

## 2017-03-02 DIAGNOSIS — C3490 Malignant neoplasm of unspecified part of unspecified bronchus or lung: Secondary | ICD-10-CM | POA: Diagnosis not present

## 2017-03-02 DIAGNOSIS — I4891 Unspecified atrial fibrillation: Secondary | ICD-10-CM | POA: Diagnosis not present

## 2017-03-02 DIAGNOSIS — J449 Chronic obstructive pulmonary disease, unspecified: Secondary | ICD-10-CM | POA: Diagnosis not present

## 2017-03-03 DIAGNOSIS — J449 Chronic obstructive pulmonary disease, unspecified: Secondary | ICD-10-CM | POA: Diagnosis not present

## 2017-03-03 DIAGNOSIS — I25119 Atherosclerotic heart disease of native coronary artery with unspecified angina pectoris: Secondary | ICD-10-CM | POA: Diagnosis not present

## 2017-03-03 DIAGNOSIS — E1159 Type 2 diabetes mellitus with other circulatory complications: Secondary | ICD-10-CM | POA: Diagnosis not present

## 2017-03-03 DIAGNOSIS — C3490 Malignant neoplasm of unspecified part of unspecified bronchus or lung: Secondary | ICD-10-CM | POA: Diagnosis not present

## 2017-03-03 DIAGNOSIS — I679 Cerebrovascular disease, unspecified: Secondary | ICD-10-CM | POA: Diagnosis not present

## 2017-03-03 DIAGNOSIS — I4891 Unspecified atrial fibrillation: Secondary | ICD-10-CM | POA: Diagnosis not present

## 2017-03-04 DIAGNOSIS — J449 Chronic obstructive pulmonary disease, unspecified: Secondary | ICD-10-CM | POA: Diagnosis not present

## 2017-03-04 DIAGNOSIS — I4891 Unspecified atrial fibrillation: Secondary | ICD-10-CM | POA: Diagnosis not present

## 2017-03-04 DIAGNOSIS — I25119 Atherosclerotic heart disease of native coronary artery with unspecified angina pectoris: Secondary | ICD-10-CM | POA: Diagnosis not present

## 2017-03-04 DIAGNOSIS — E1159 Type 2 diabetes mellitus with other circulatory complications: Secondary | ICD-10-CM | POA: Diagnosis not present

## 2017-03-04 DIAGNOSIS — I679 Cerebrovascular disease, unspecified: Secondary | ICD-10-CM | POA: Diagnosis not present

## 2017-03-04 DIAGNOSIS — C3490 Malignant neoplasm of unspecified part of unspecified bronchus or lung: Secondary | ICD-10-CM | POA: Diagnosis not present

## 2017-03-05 DIAGNOSIS — N184 Chronic kidney disease, stage 4 (severe): Secondary | ICD-10-CM | POA: Diagnosis not present

## 2017-03-05 DIAGNOSIS — I10 Essential (primary) hypertension: Secondary | ICD-10-CM | POA: Diagnosis not present

## 2017-03-05 DIAGNOSIS — I25119 Atherosclerotic heart disease of native coronary artery with unspecified angina pectoris: Secondary | ICD-10-CM | POA: Diagnosis not present

## 2017-03-05 DIAGNOSIS — I679 Cerebrovascular disease, unspecified: Secondary | ICD-10-CM | POA: Diagnosis not present

## 2017-03-05 DIAGNOSIS — E039 Hypothyroidism, unspecified: Secondary | ICD-10-CM | POA: Diagnosis not present

## 2017-03-05 DIAGNOSIS — F339 Major depressive disorder, recurrent, unspecified: Secondary | ICD-10-CM | POA: Diagnosis not present

## 2017-03-05 DIAGNOSIS — M109 Gout, unspecified: Secondary | ICD-10-CM | POA: Diagnosis not present

## 2017-03-05 DIAGNOSIS — E1159 Type 2 diabetes mellitus with other circulatory complications: Secondary | ICD-10-CM | POA: Diagnosis not present

## 2017-03-05 DIAGNOSIS — D638 Anemia in other chronic diseases classified elsewhere: Secondary | ICD-10-CM | POA: Diagnosis not present

## 2017-03-05 DIAGNOSIS — I509 Heart failure, unspecified: Secondary | ICD-10-CM | POA: Diagnosis not present

## 2017-03-05 DIAGNOSIS — H409 Unspecified glaucoma: Secondary | ICD-10-CM | POA: Diagnosis not present

## 2017-03-05 DIAGNOSIS — I4891 Unspecified atrial fibrillation: Secondary | ICD-10-CM | POA: Diagnosis not present

## 2017-03-05 DIAGNOSIS — C3490 Malignant neoplasm of unspecified part of unspecified bronchus or lung: Secondary | ICD-10-CM | POA: Diagnosis not present

## 2017-03-05 DIAGNOSIS — K219 Gastro-esophageal reflux disease without esophagitis: Secondary | ICD-10-CM | POA: Diagnosis not present

## 2017-03-05 DIAGNOSIS — J449 Chronic obstructive pulmonary disease, unspecified: Secondary | ICD-10-CM | POA: Diagnosis not present

## 2017-03-06 DIAGNOSIS — I679 Cerebrovascular disease, unspecified: Secondary | ICD-10-CM | POA: Diagnosis not present

## 2017-03-06 DIAGNOSIS — C3490 Malignant neoplasm of unspecified part of unspecified bronchus or lung: Secondary | ICD-10-CM | POA: Diagnosis not present

## 2017-03-06 DIAGNOSIS — I4891 Unspecified atrial fibrillation: Secondary | ICD-10-CM | POA: Diagnosis not present

## 2017-03-06 DIAGNOSIS — J449 Chronic obstructive pulmonary disease, unspecified: Secondary | ICD-10-CM | POA: Diagnosis not present

## 2017-03-06 DIAGNOSIS — I25119 Atherosclerotic heart disease of native coronary artery with unspecified angina pectoris: Secondary | ICD-10-CM | POA: Diagnosis not present

## 2017-03-06 DIAGNOSIS — E1159 Type 2 diabetes mellitus with other circulatory complications: Secondary | ICD-10-CM | POA: Diagnosis not present

## 2017-03-09 DIAGNOSIS — I4891 Unspecified atrial fibrillation: Secondary | ICD-10-CM | POA: Diagnosis not present

## 2017-03-09 DIAGNOSIS — I25119 Atherosclerotic heart disease of native coronary artery with unspecified angina pectoris: Secondary | ICD-10-CM | POA: Diagnosis not present

## 2017-03-09 DIAGNOSIS — C3490 Malignant neoplasm of unspecified part of unspecified bronchus or lung: Secondary | ICD-10-CM | POA: Diagnosis not present

## 2017-03-09 DIAGNOSIS — J449 Chronic obstructive pulmonary disease, unspecified: Secondary | ICD-10-CM | POA: Diagnosis not present

## 2017-03-09 DIAGNOSIS — I679 Cerebrovascular disease, unspecified: Secondary | ICD-10-CM | POA: Diagnosis not present

## 2017-03-09 DIAGNOSIS — E1159 Type 2 diabetes mellitus with other circulatory complications: Secondary | ICD-10-CM | POA: Diagnosis not present

## 2017-03-10 DIAGNOSIS — I679 Cerebrovascular disease, unspecified: Secondary | ICD-10-CM | POA: Diagnosis not present

## 2017-03-10 DIAGNOSIS — C3490 Malignant neoplasm of unspecified part of unspecified bronchus or lung: Secondary | ICD-10-CM | POA: Diagnosis not present

## 2017-03-10 DIAGNOSIS — E1159 Type 2 diabetes mellitus with other circulatory complications: Secondary | ICD-10-CM | POA: Diagnosis not present

## 2017-03-10 DIAGNOSIS — I4891 Unspecified atrial fibrillation: Secondary | ICD-10-CM | POA: Diagnosis not present

## 2017-03-10 DIAGNOSIS — J449 Chronic obstructive pulmonary disease, unspecified: Secondary | ICD-10-CM | POA: Diagnosis not present

## 2017-03-10 DIAGNOSIS — I25119 Atherosclerotic heart disease of native coronary artery with unspecified angina pectoris: Secondary | ICD-10-CM | POA: Diagnosis not present

## 2017-03-11 DIAGNOSIS — C3490 Malignant neoplasm of unspecified part of unspecified bronchus or lung: Secondary | ICD-10-CM | POA: Diagnosis not present

## 2017-03-11 DIAGNOSIS — I4891 Unspecified atrial fibrillation: Secondary | ICD-10-CM | POA: Diagnosis not present

## 2017-03-11 DIAGNOSIS — J449 Chronic obstructive pulmonary disease, unspecified: Secondary | ICD-10-CM | POA: Diagnosis not present

## 2017-03-11 DIAGNOSIS — E1159 Type 2 diabetes mellitus with other circulatory complications: Secondary | ICD-10-CM | POA: Diagnosis not present

## 2017-03-11 DIAGNOSIS — I679 Cerebrovascular disease, unspecified: Secondary | ICD-10-CM | POA: Diagnosis not present

## 2017-03-11 DIAGNOSIS — I25119 Atherosclerotic heart disease of native coronary artery with unspecified angina pectoris: Secondary | ICD-10-CM | POA: Diagnosis not present

## 2017-03-12 DIAGNOSIS — E1159 Type 2 diabetes mellitus with other circulatory complications: Secondary | ICD-10-CM | POA: Diagnosis not present

## 2017-03-12 DIAGNOSIS — I4891 Unspecified atrial fibrillation: Secondary | ICD-10-CM | POA: Diagnosis not present

## 2017-03-12 DIAGNOSIS — I25119 Atherosclerotic heart disease of native coronary artery with unspecified angina pectoris: Secondary | ICD-10-CM | POA: Diagnosis not present

## 2017-03-12 DIAGNOSIS — C3490 Malignant neoplasm of unspecified part of unspecified bronchus or lung: Secondary | ICD-10-CM | POA: Diagnosis not present

## 2017-03-12 DIAGNOSIS — J449 Chronic obstructive pulmonary disease, unspecified: Secondary | ICD-10-CM | POA: Diagnosis not present

## 2017-03-12 DIAGNOSIS — I679 Cerebrovascular disease, unspecified: Secondary | ICD-10-CM | POA: Diagnosis not present

## 2017-03-13 DIAGNOSIS — I679 Cerebrovascular disease, unspecified: Secondary | ICD-10-CM | POA: Diagnosis not present

## 2017-03-13 DIAGNOSIS — I25119 Atherosclerotic heart disease of native coronary artery with unspecified angina pectoris: Secondary | ICD-10-CM | POA: Diagnosis not present

## 2017-03-13 DIAGNOSIS — E1159 Type 2 diabetes mellitus with other circulatory complications: Secondary | ICD-10-CM | POA: Diagnosis not present

## 2017-03-13 DIAGNOSIS — I4891 Unspecified atrial fibrillation: Secondary | ICD-10-CM | POA: Diagnosis not present

## 2017-03-13 DIAGNOSIS — J449 Chronic obstructive pulmonary disease, unspecified: Secondary | ICD-10-CM | POA: Diagnosis not present

## 2017-03-13 DIAGNOSIS — C3490 Malignant neoplasm of unspecified part of unspecified bronchus or lung: Secondary | ICD-10-CM | POA: Diagnosis not present

## 2017-03-16 DIAGNOSIS — C3490 Malignant neoplasm of unspecified part of unspecified bronchus or lung: Secondary | ICD-10-CM | POA: Diagnosis not present

## 2017-03-16 DIAGNOSIS — I25119 Atherosclerotic heart disease of native coronary artery with unspecified angina pectoris: Secondary | ICD-10-CM | POA: Diagnosis not present

## 2017-03-16 DIAGNOSIS — I4891 Unspecified atrial fibrillation: Secondary | ICD-10-CM | POA: Diagnosis not present

## 2017-03-16 DIAGNOSIS — I679 Cerebrovascular disease, unspecified: Secondary | ICD-10-CM | POA: Diagnosis not present

## 2017-03-16 DIAGNOSIS — E1159 Type 2 diabetes mellitus with other circulatory complications: Secondary | ICD-10-CM | POA: Diagnosis not present

## 2017-03-16 DIAGNOSIS — J449 Chronic obstructive pulmonary disease, unspecified: Secondary | ICD-10-CM | POA: Diagnosis not present

## 2017-03-17 DIAGNOSIS — C3490 Malignant neoplasm of unspecified part of unspecified bronchus or lung: Secondary | ICD-10-CM | POA: Diagnosis not present

## 2017-03-17 DIAGNOSIS — I679 Cerebrovascular disease, unspecified: Secondary | ICD-10-CM | POA: Diagnosis not present

## 2017-03-17 DIAGNOSIS — I25119 Atherosclerotic heart disease of native coronary artery with unspecified angina pectoris: Secondary | ICD-10-CM | POA: Diagnosis not present

## 2017-03-17 DIAGNOSIS — J449 Chronic obstructive pulmonary disease, unspecified: Secondary | ICD-10-CM | POA: Diagnosis not present

## 2017-03-17 DIAGNOSIS — I4891 Unspecified atrial fibrillation: Secondary | ICD-10-CM | POA: Diagnosis not present

## 2017-03-17 DIAGNOSIS — E1159 Type 2 diabetes mellitus with other circulatory complications: Secondary | ICD-10-CM | POA: Diagnosis not present

## 2017-03-18 DIAGNOSIS — J449 Chronic obstructive pulmonary disease, unspecified: Secondary | ICD-10-CM | POA: Diagnosis not present

## 2017-03-18 DIAGNOSIS — I25119 Atherosclerotic heart disease of native coronary artery with unspecified angina pectoris: Secondary | ICD-10-CM | POA: Diagnosis not present

## 2017-03-18 DIAGNOSIS — I4891 Unspecified atrial fibrillation: Secondary | ICD-10-CM | POA: Diagnosis not present

## 2017-03-18 DIAGNOSIS — C3490 Malignant neoplasm of unspecified part of unspecified bronchus or lung: Secondary | ICD-10-CM | POA: Diagnosis not present

## 2017-03-18 DIAGNOSIS — I679 Cerebrovascular disease, unspecified: Secondary | ICD-10-CM | POA: Diagnosis not present

## 2017-03-18 DIAGNOSIS — E1159 Type 2 diabetes mellitus with other circulatory complications: Secondary | ICD-10-CM | POA: Diagnosis not present

## 2017-03-19 DIAGNOSIS — I4891 Unspecified atrial fibrillation: Secondary | ICD-10-CM | POA: Diagnosis not present

## 2017-03-19 DIAGNOSIS — E1159 Type 2 diabetes mellitus with other circulatory complications: Secondary | ICD-10-CM | POA: Diagnosis not present

## 2017-03-19 DIAGNOSIS — J449 Chronic obstructive pulmonary disease, unspecified: Secondary | ICD-10-CM | POA: Diagnosis not present

## 2017-03-19 DIAGNOSIS — I679 Cerebrovascular disease, unspecified: Secondary | ICD-10-CM | POA: Diagnosis not present

## 2017-03-19 DIAGNOSIS — C3490 Malignant neoplasm of unspecified part of unspecified bronchus or lung: Secondary | ICD-10-CM | POA: Diagnosis not present

## 2017-03-19 DIAGNOSIS — I25119 Atherosclerotic heart disease of native coronary artery with unspecified angina pectoris: Secondary | ICD-10-CM | POA: Diagnosis not present

## 2017-03-20 DIAGNOSIS — E1159 Type 2 diabetes mellitus with other circulatory complications: Secondary | ICD-10-CM | POA: Diagnosis not present

## 2017-03-20 DIAGNOSIS — C3490 Malignant neoplasm of unspecified part of unspecified bronchus or lung: Secondary | ICD-10-CM | POA: Diagnosis not present

## 2017-03-20 DIAGNOSIS — I679 Cerebrovascular disease, unspecified: Secondary | ICD-10-CM | POA: Diagnosis not present

## 2017-03-20 DIAGNOSIS — J449 Chronic obstructive pulmonary disease, unspecified: Secondary | ICD-10-CM | POA: Diagnosis not present

## 2017-03-20 DIAGNOSIS — I25119 Atherosclerotic heart disease of native coronary artery with unspecified angina pectoris: Secondary | ICD-10-CM | POA: Diagnosis not present

## 2017-03-20 DIAGNOSIS — I4891 Unspecified atrial fibrillation: Secondary | ICD-10-CM | POA: Diagnosis not present

## 2017-03-21 DIAGNOSIS — I679 Cerebrovascular disease, unspecified: Secondary | ICD-10-CM | POA: Diagnosis not present

## 2017-03-21 DIAGNOSIS — E1159 Type 2 diabetes mellitus with other circulatory complications: Secondary | ICD-10-CM | POA: Diagnosis not present

## 2017-03-21 DIAGNOSIS — C3490 Malignant neoplasm of unspecified part of unspecified bronchus or lung: Secondary | ICD-10-CM | POA: Diagnosis not present

## 2017-03-21 DIAGNOSIS — I25119 Atherosclerotic heart disease of native coronary artery with unspecified angina pectoris: Secondary | ICD-10-CM | POA: Diagnosis not present

## 2017-03-21 DIAGNOSIS — J449 Chronic obstructive pulmonary disease, unspecified: Secondary | ICD-10-CM | POA: Diagnosis not present

## 2017-03-21 DIAGNOSIS — I4891 Unspecified atrial fibrillation: Secondary | ICD-10-CM | POA: Diagnosis not present

## 2017-03-22 DIAGNOSIS — J449 Chronic obstructive pulmonary disease, unspecified: Secondary | ICD-10-CM | POA: Diagnosis not present

## 2017-03-22 DIAGNOSIS — C3490 Malignant neoplasm of unspecified part of unspecified bronchus or lung: Secondary | ICD-10-CM | POA: Diagnosis not present

## 2017-03-22 DIAGNOSIS — E1159 Type 2 diabetes mellitus with other circulatory complications: Secondary | ICD-10-CM | POA: Diagnosis not present

## 2017-03-22 DIAGNOSIS — I4891 Unspecified atrial fibrillation: Secondary | ICD-10-CM | POA: Diagnosis not present

## 2017-03-22 DIAGNOSIS — I25119 Atherosclerotic heart disease of native coronary artery with unspecified angina pectoris: Secondary | ICD-10-CM | POA: Diagnosis not present

## 2017-03-22 DIAGNOSIS — I679 Cerebrovascular disease, unspecified: Secondary | ICD-10-CM | POA: Diagnosis not present

## 2017-03-23 DIAGNOSIS — J449 Chronic obstructive pulmonary disease, unspecified: Secondary | ICD-10-CM | POA: Diagnosis not present

## 2017-03-23 DIAGNOSIS — E1159 Type 2 diabetes mellitus with other circulatory complications: Secondary | ICD-10-CM | POA: Diagnosis not present

## 2017-03-23 DIAGNOSIS — I25119 Atherosclerotic heart disease of native coronary artery with unspecified angina pectoris: Secondary | ICD-10-CM | POA: Diagnosis not present

## 2017-03-23 DIAGNOSIS — I679 Cerebrovascular disease, unspecified: Secondary | ICD-10-CM | POA: Diagnosis not present

## 2017-03-23 DIAGNOSIS — I4891 Unspecified atrial fibrillation: Secondary | ICD-10-CM | POA: Diagnosis not present

## 2017-03-23 DIAGNOSIS — C3490 Malignant neoplasm of unspecified part of unspecified bronchus or lung: Secondary | ICD-10-CM | POA: Diagnosis not present

## 2017-04-04 DEATH — deceased

## 2018-04-18 IMAGING — CT CT BIOPSY
1 of 2 series · 15 of 32 positions shown, 19 images · non-contrast
Comparison: none

INDICATION: Right lower lobe lung mass

[Series 2: i-spiral 5.0 b40f · axial · 0.85mm/px · z∈[+14,+277]mm · 15 of 86 slices shown, 19 images]
[im 7/86  soft-tissue]
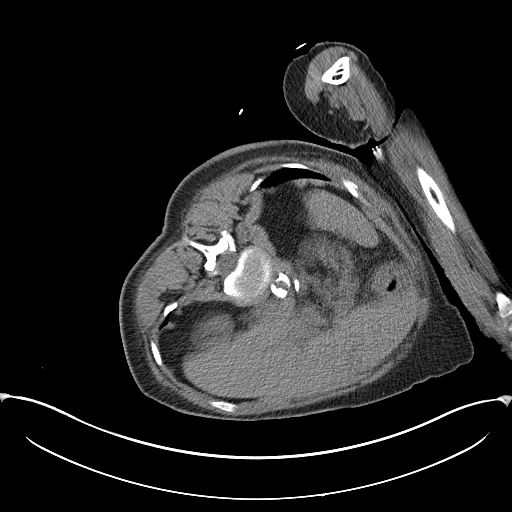
[im 7/86  bone]
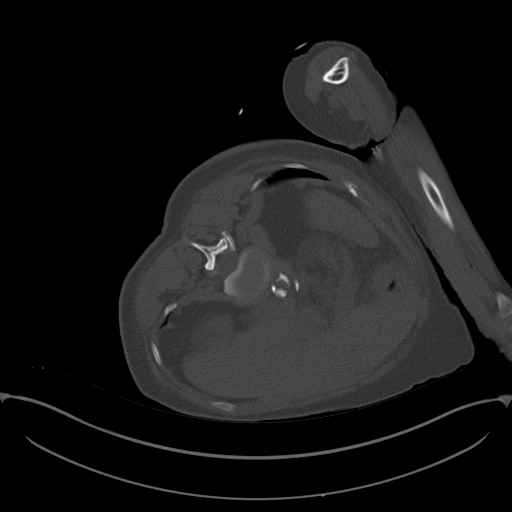
[im 13/86  soft-tissue]
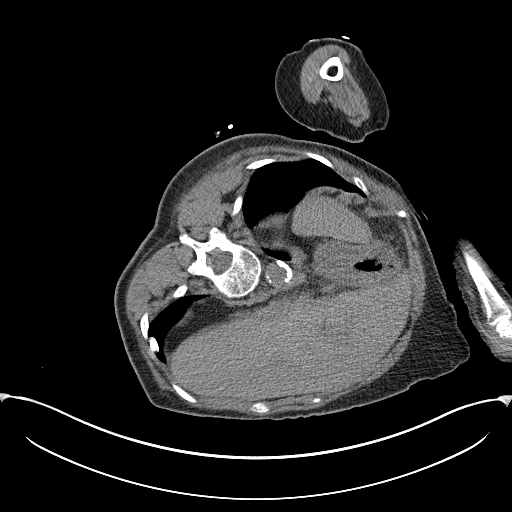
[im 19/86  soft-tissue]
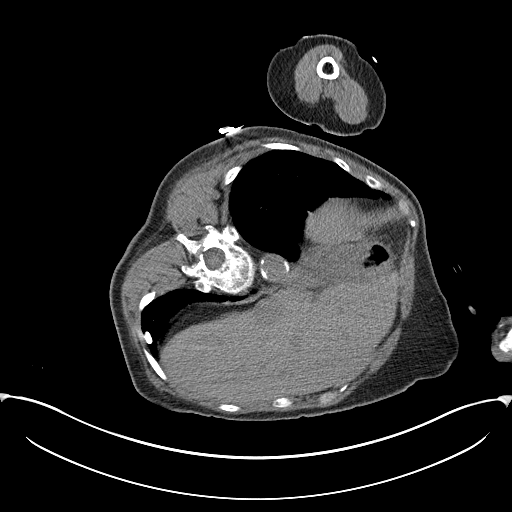
[im 26/86  soft-tissue]
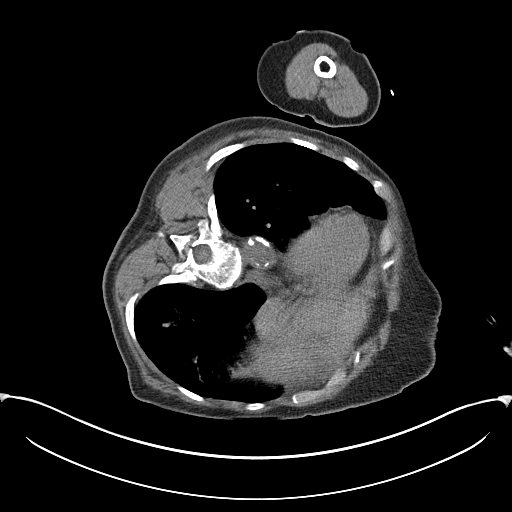
[im 32/86  soft-tissue]
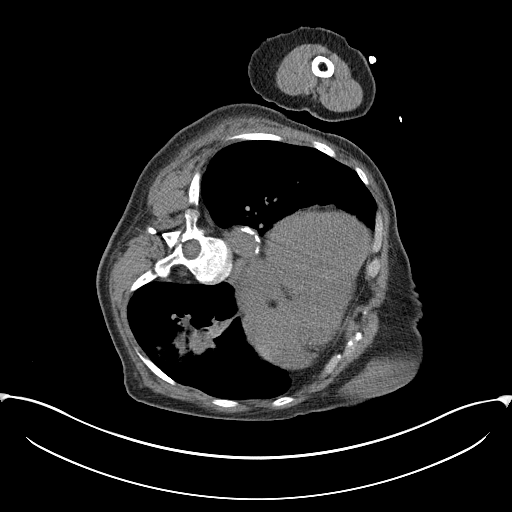
[im 38/86  soft-tissue]
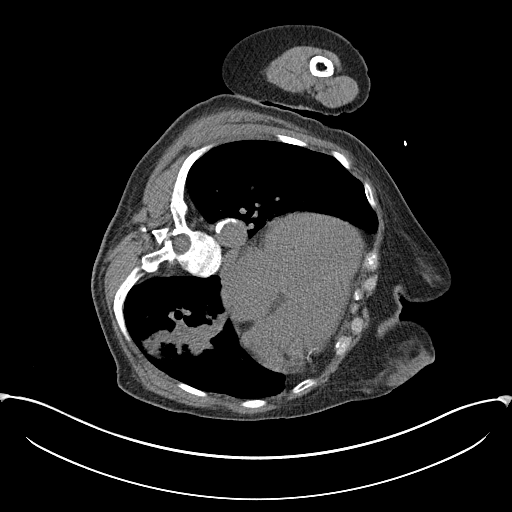
[im 45/86  soft-tissue]
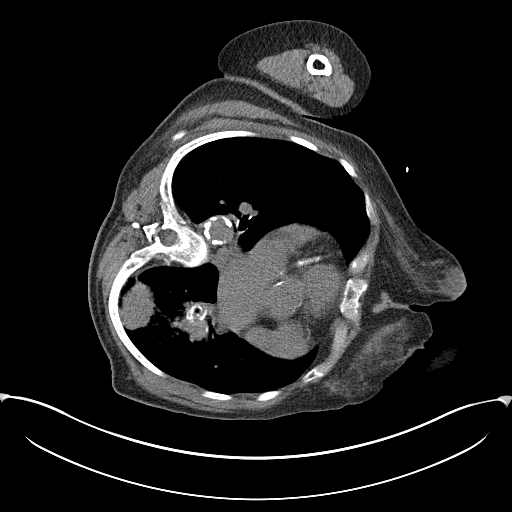
[im 51/86  soft-tissue]
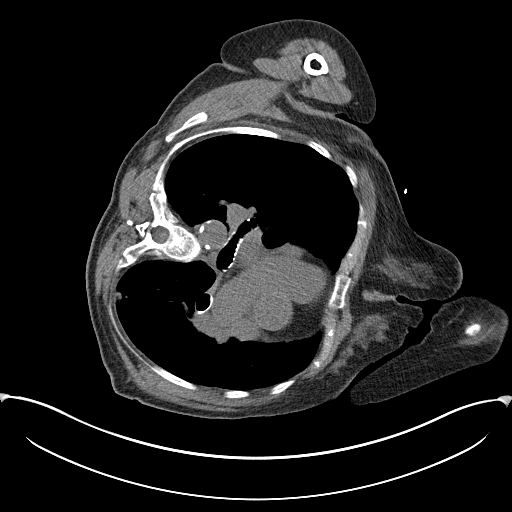
[im 57/86  soft-tissue]
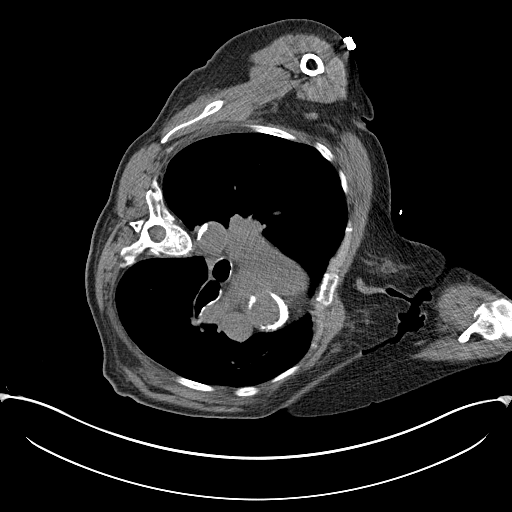
[im 57/86  bone]
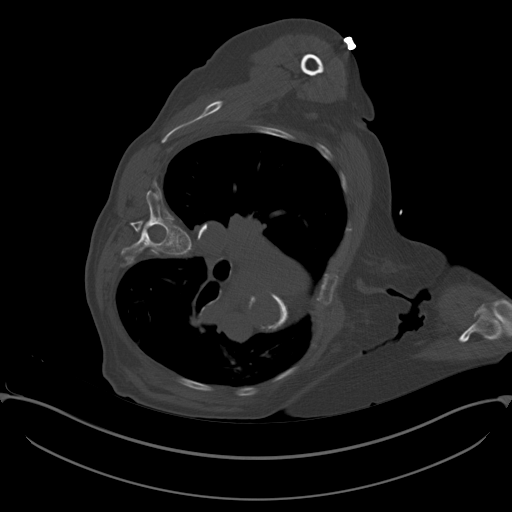
[im 63/86  soft-tissue]
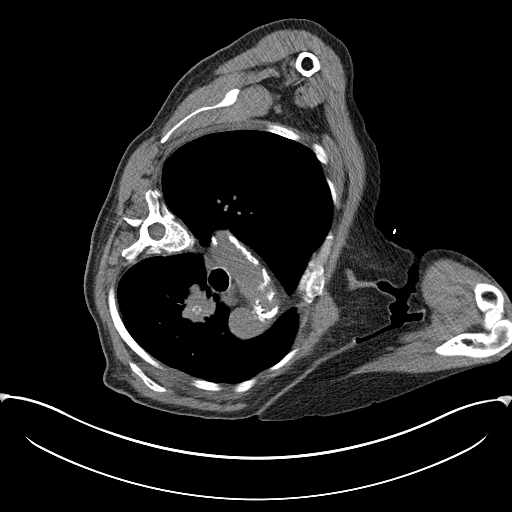
[im 70/86  soft-tissue]
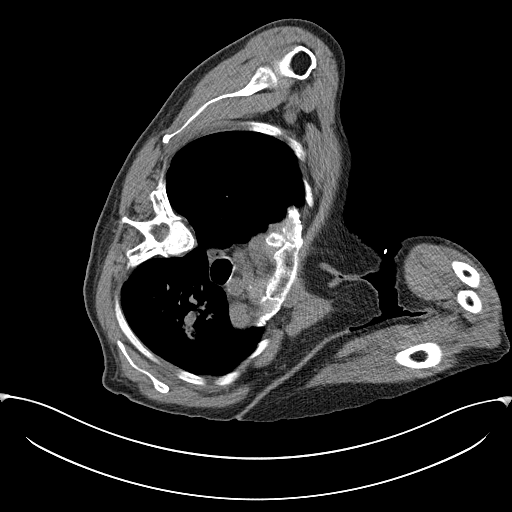
[im 73/86  lung]
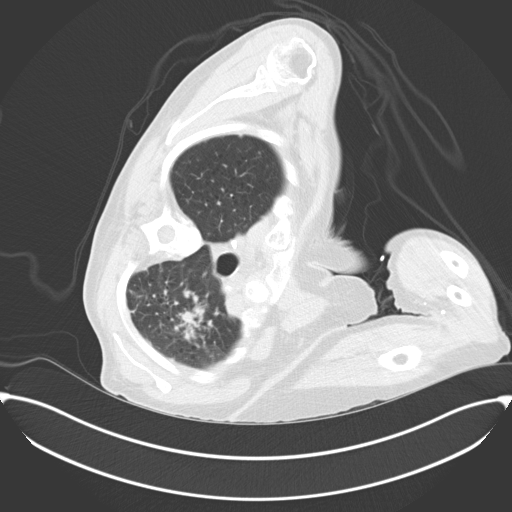
[im 76/86  soft-tissue]
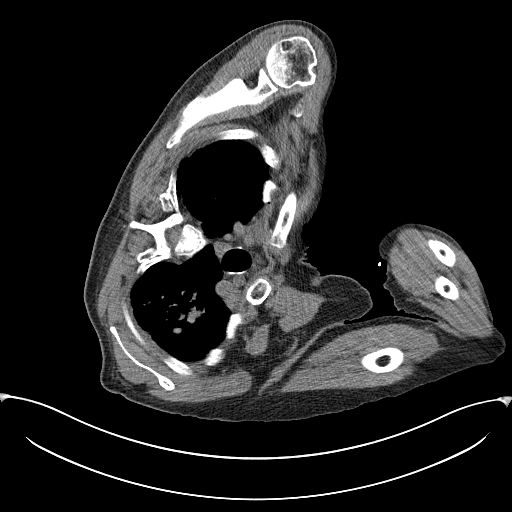
[im 76/86  lung]
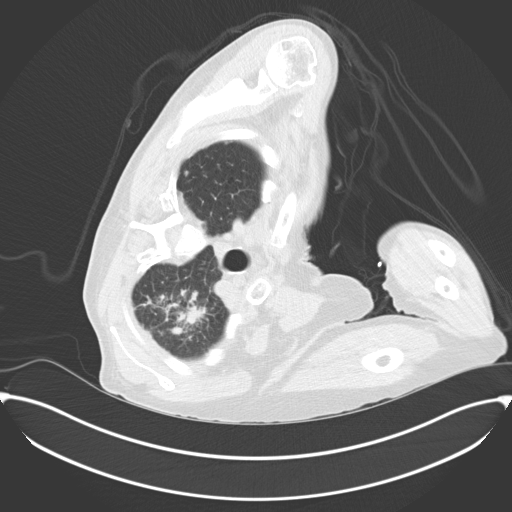
[im 79/86  lung]
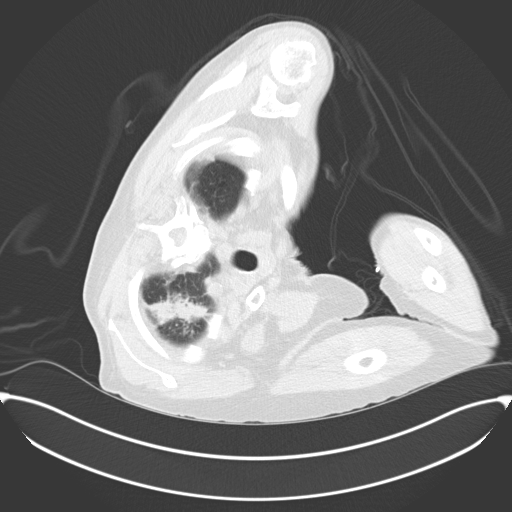
[im 82/86  soft-tissue]
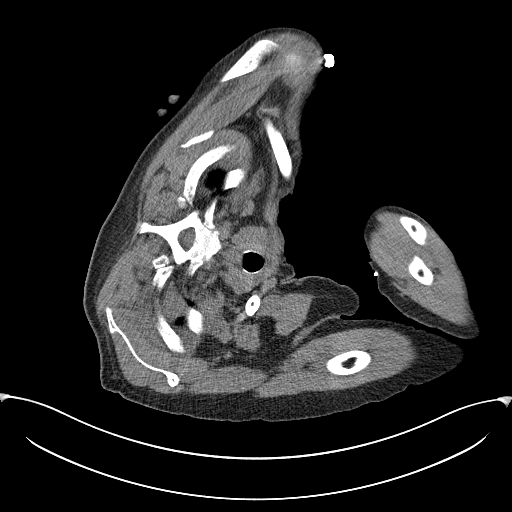
[im 82/86  lung]
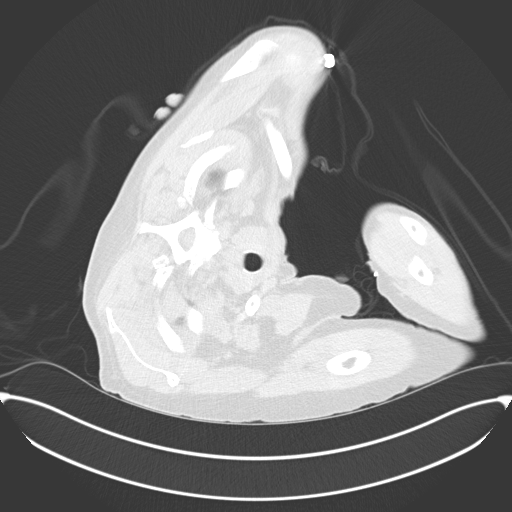

[15 of 32 positions shown; findings below may reference images not displayed]

EXAM:
CT BIOPSY

MEDICATIONS:
None.

ANESTHESIA/SEDATION:
Fentanyl 50 mcg IV; Versed 1.5 mg IV

Moderate Sedation Time:  11

The patient was continuously monitored during the procedure by the
interventional radiology nurse under my direct supervision.

FLUOROSCOPY TIME:  None.

COMPLICATIONS:
None immediate.

PROCEDURE:
Informed written consent was obtained from the patient after a
thorough discussion of the procedural risks, benefits and
alternatives. All questions were addressed. Maximal Sterile Barrier
Technique was utilized including caps, mask, sterile gowns, sterile
gloves, sterile drape, hand hygiene and skin antiseptic. A timeout
was performed prior to the initiation of the procedure.

Under CT guidance, a(n) 17 gauge guide needle was advanced into the
right lower lobe lung mass. Subsequently 3 18 gauge core biopsies
were obtained. The guide needle was removed. Post biopsy images
demonstrate no pneumothorax.

Patient tolerated the procedure well without complication. Vital
sign monitoring by nursing staff during the procedure will continue
as patient is in the special procedures unit for post procedure
observation.
FINDINGS: The images document guide needle placement within the right lower
lobe lung mass. Post biopsy images demonstrate no pneumothorax.
IMPRESSION: Successful CT-guided right lower lobe lung mass biopsy.

## 2018-05-08 IMAGING — MR MR MRA HEAD W/O CM
1 series · 20 of 48 positions shown · non-contrast
Comparison: Brain MRI 08/16/2016

CLINICAL DATA: TIA.  History of lung cancer.

EXAM:
MRA HEAD WITHOUT CONTRAST
TECHNIQUE: Angiographic images of the Circle of Willis were obtained using MRA
technique without intravenous contrast.

[Series 3: ax (id) 2 · axial · 1.0mm · 0.43mm/px · z∈[-56,+33]mm · 20 of 194 slices shown]
[im 1/194]
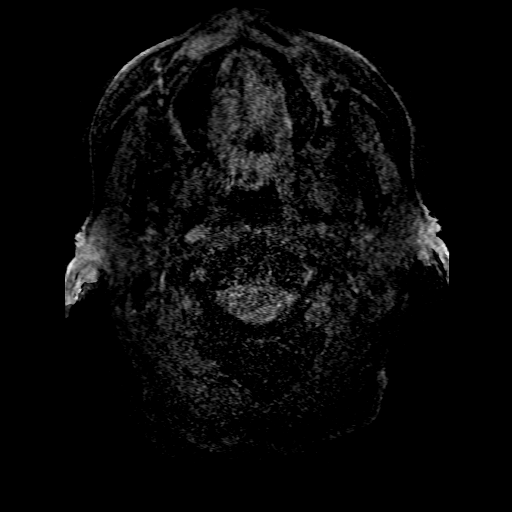
[im 5/194]
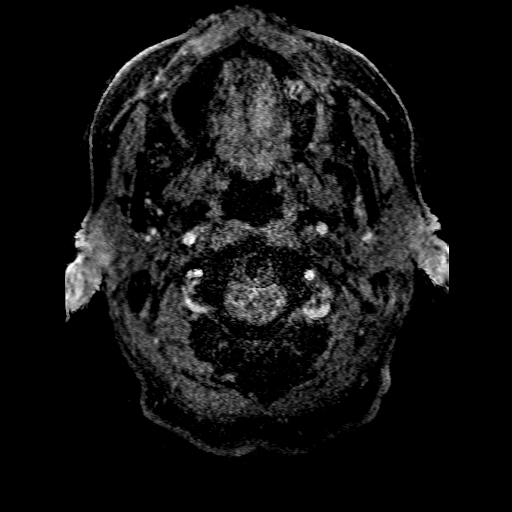
[im 9/194]
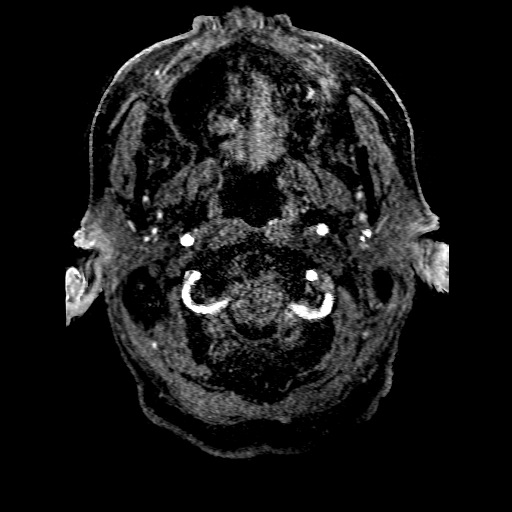
[im 13/194]
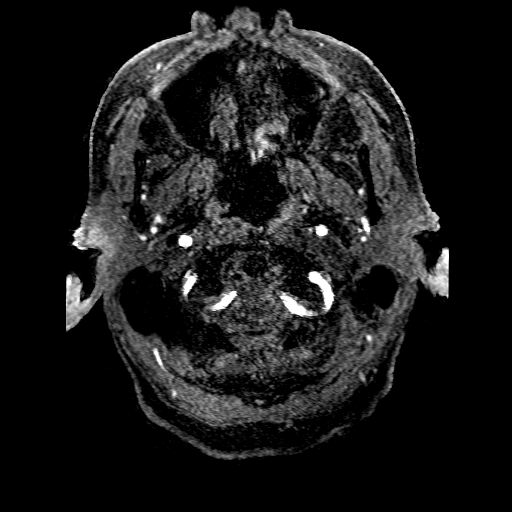
[im 17/194]
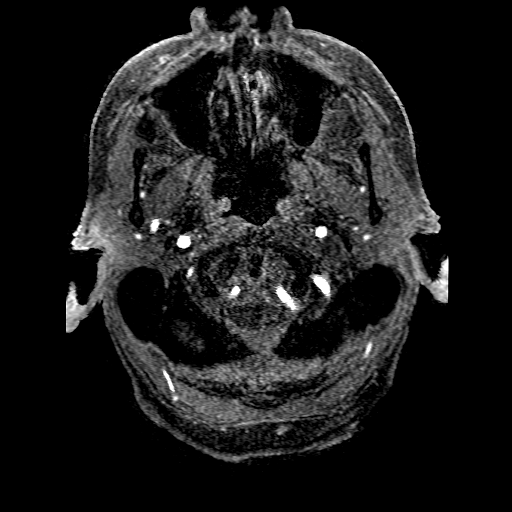
[im 21/194]
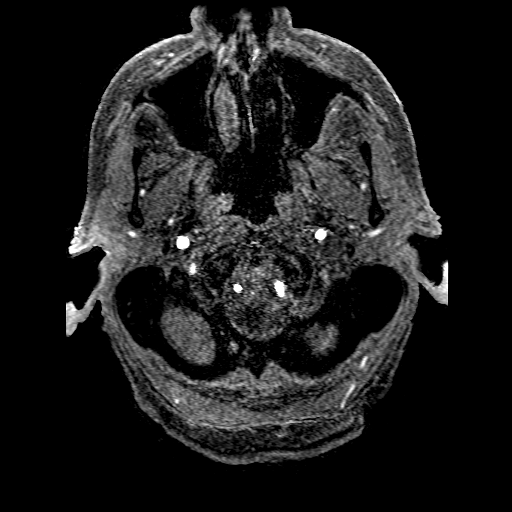
[im 25/194]
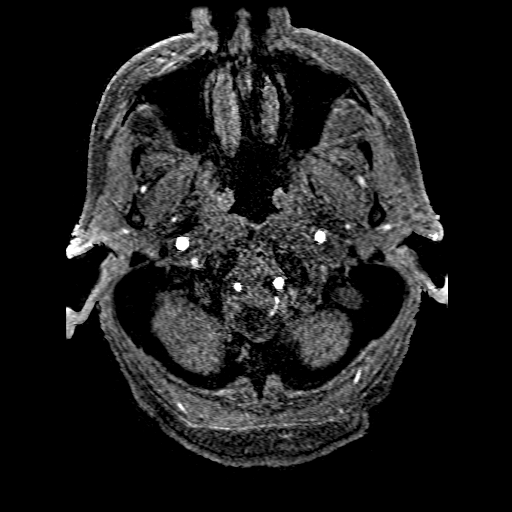
[im 29/194]
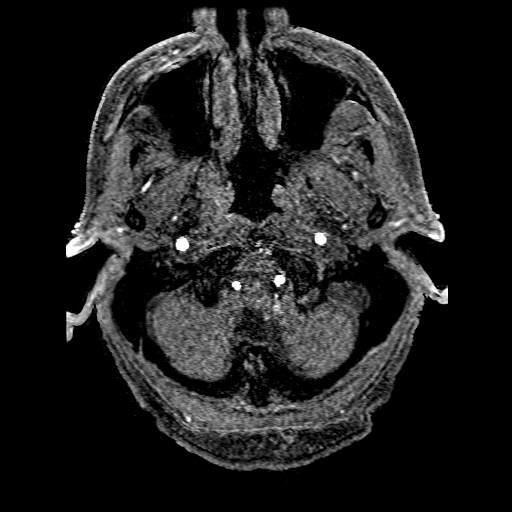
[im 33/194]
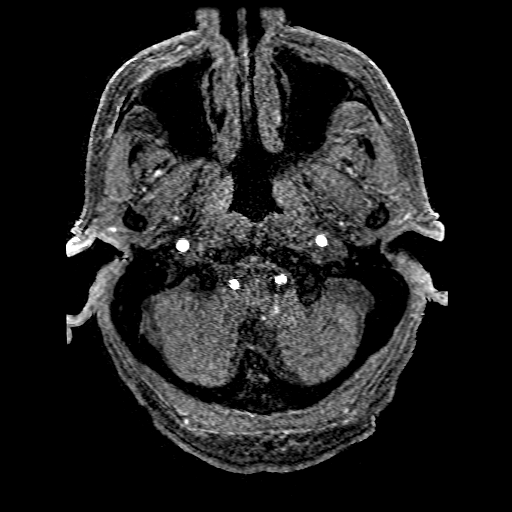
[im 37/194]
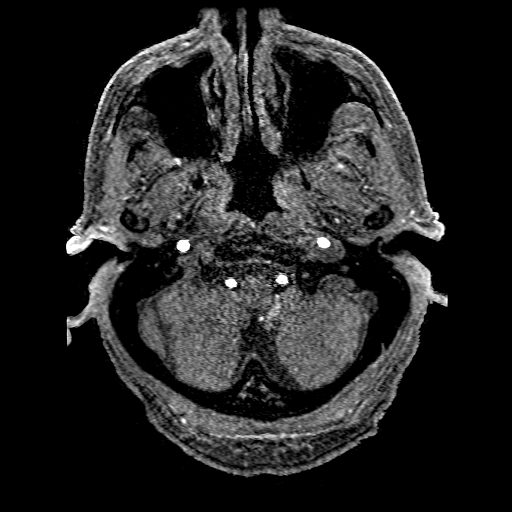
[im 42/194]
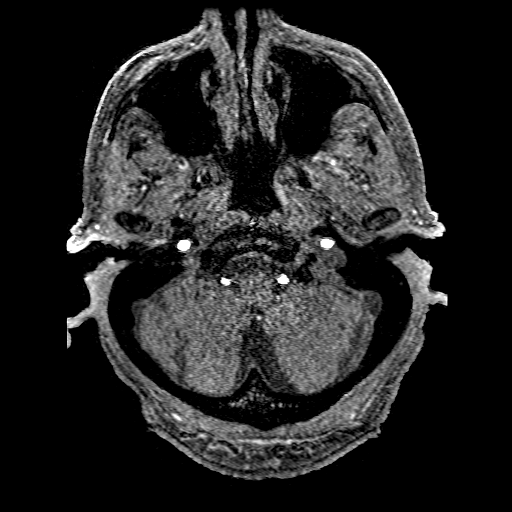
[im 46/194]
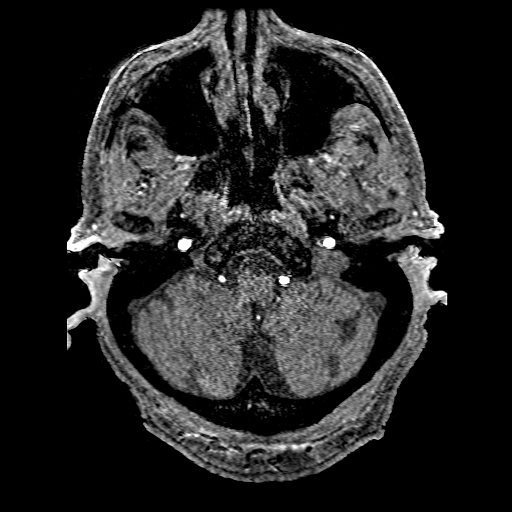
[im 62/194]
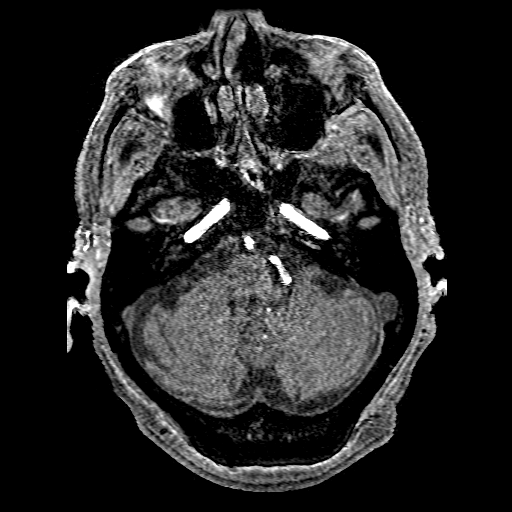
[im 87/194]
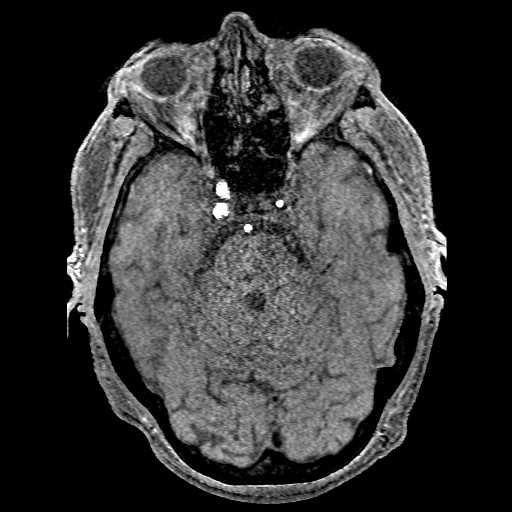
[im 99/194]
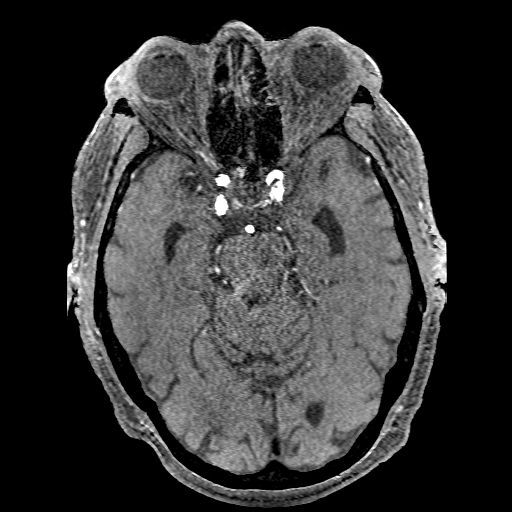
[im 111/194]
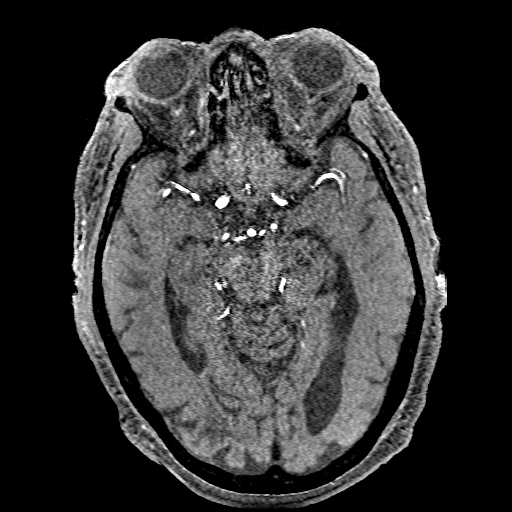
[im 136/194]
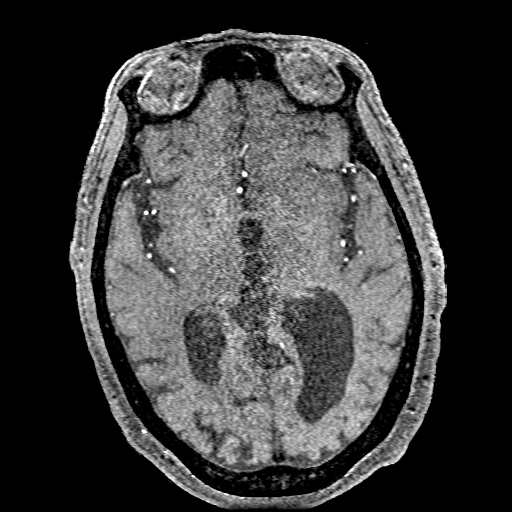
[im 161/194]
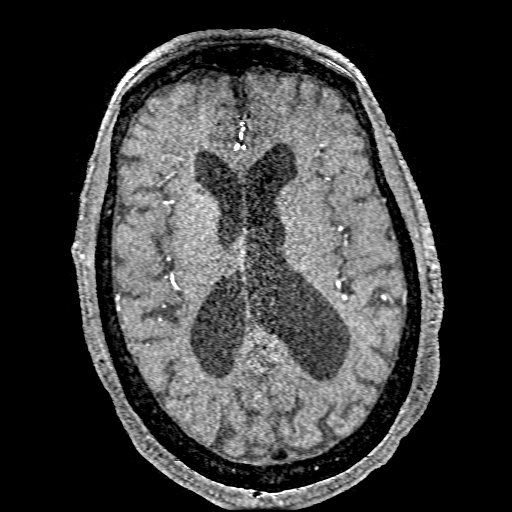
[im 165/194]
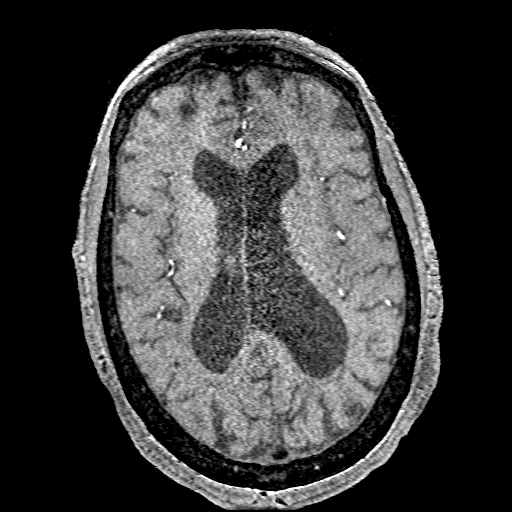
[im 185/194]
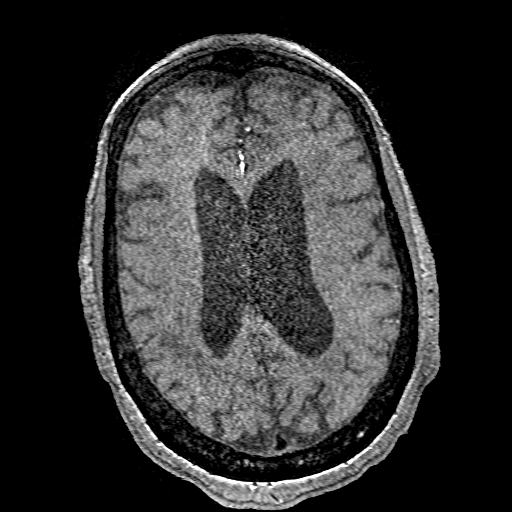

[20 of 48 positions shown; findings below may reference images not displayed]

FINDINGS: Intracranial internal carotid arteries: There is mild irregular
narrowing of the right internal carotid artery lacerum and cavernous
segments. There is approximately 50% narrowing of the left internal
carotid artery lacerum segment (series 3 image 87

Anterior cerebral arteries: Normal.

Middle cerebral arteries: Normal.

Posterior communicating arteries: Absent bilaterally.

Posterior cerebral arteries: Normal.

Basilar artery: Normal.

Vertebral arteries: Left dominant. Normal.

Superior cerebellar arteries: Normal.

Anterior inferior cerebellar arteries: Normal.

Posterior inferior cerebellar arteries: Normal.
IMPRESSION: 1. No intracranial arterial occlusion or high-grade stenosis.
2. Mild atherosclerotic narrowing of both intracranial internal
carotid arteries.

## 2018-05-23 IMAGING — CR DG CHEST 2V
2 series · 2 of 2 positions shown · non-contrast
Comparison: 08/24/2016, 08/17/2016 and earlier, including PET-CT
07/30/2016.

CLINICAL DATA: 86-year-old with current history of right upper and
lower lobe lung cancer for which she is undergoing treatment,
presenting with progressively worsening fatigue, cough, chest
congestion and shortness of breath over the past few days.

EXAM:
CHEST  2 VIEW

[x chest ap]
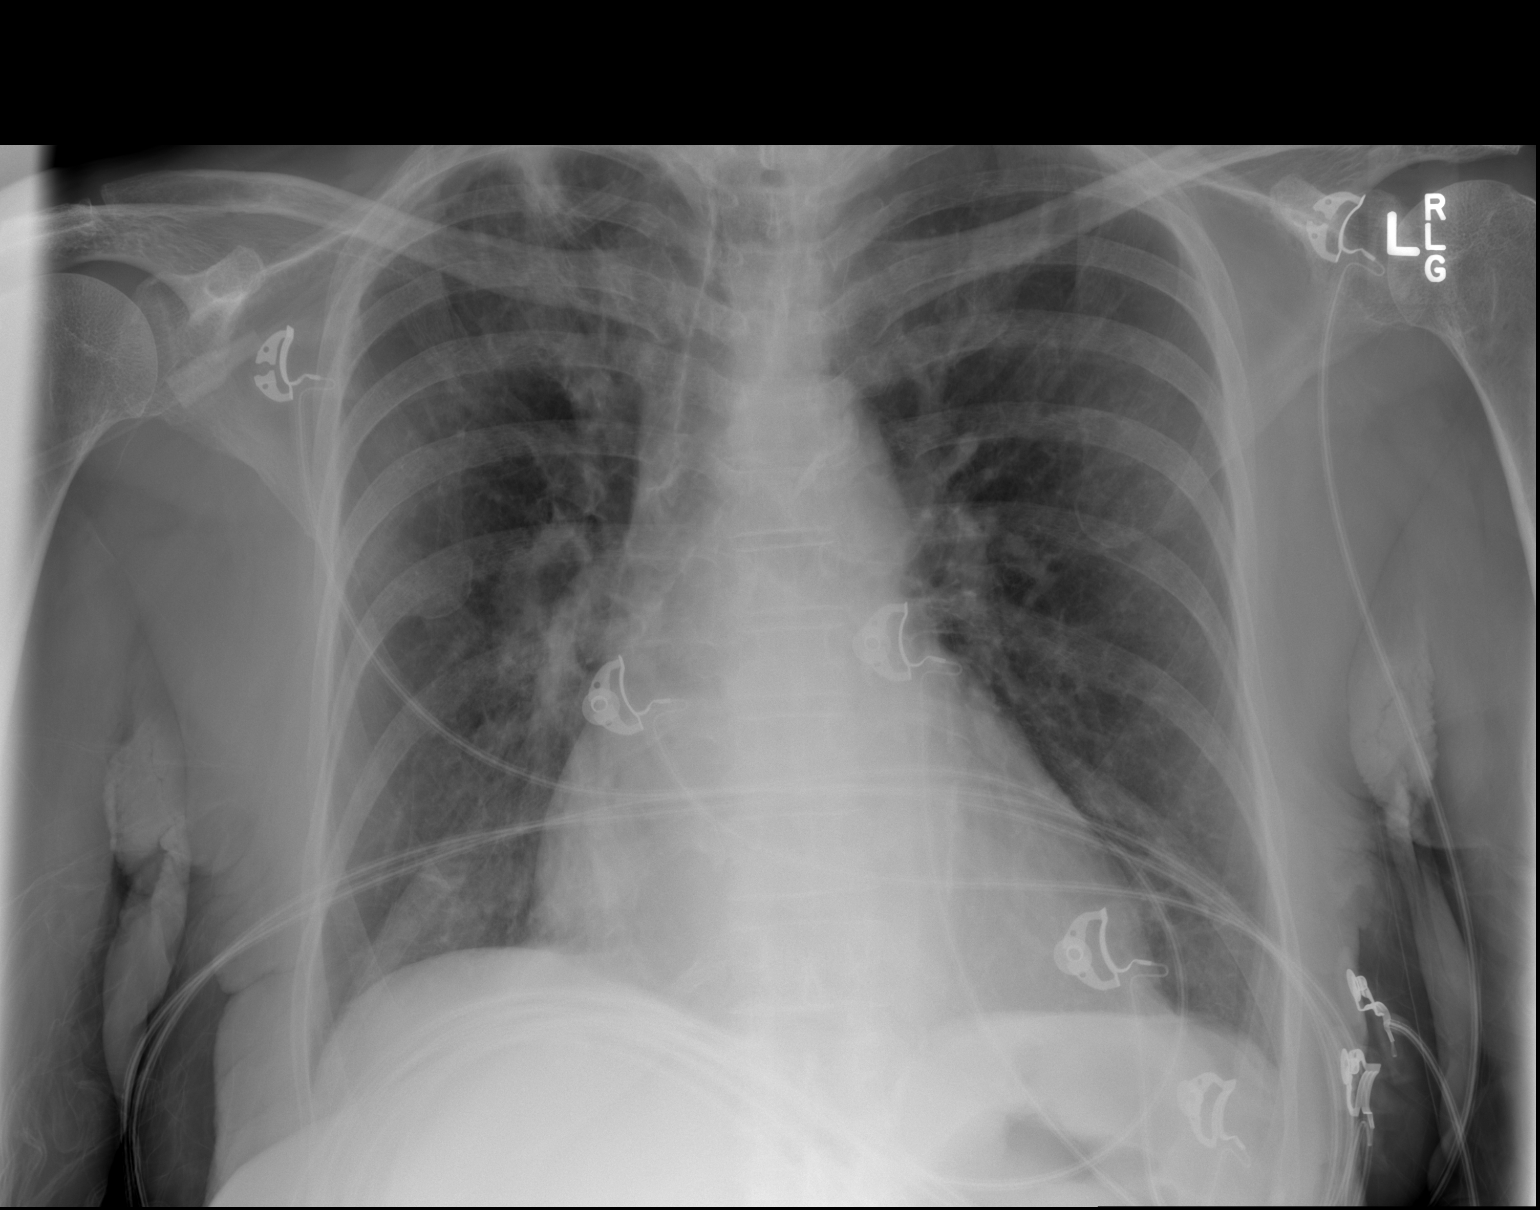

[w chest lat]
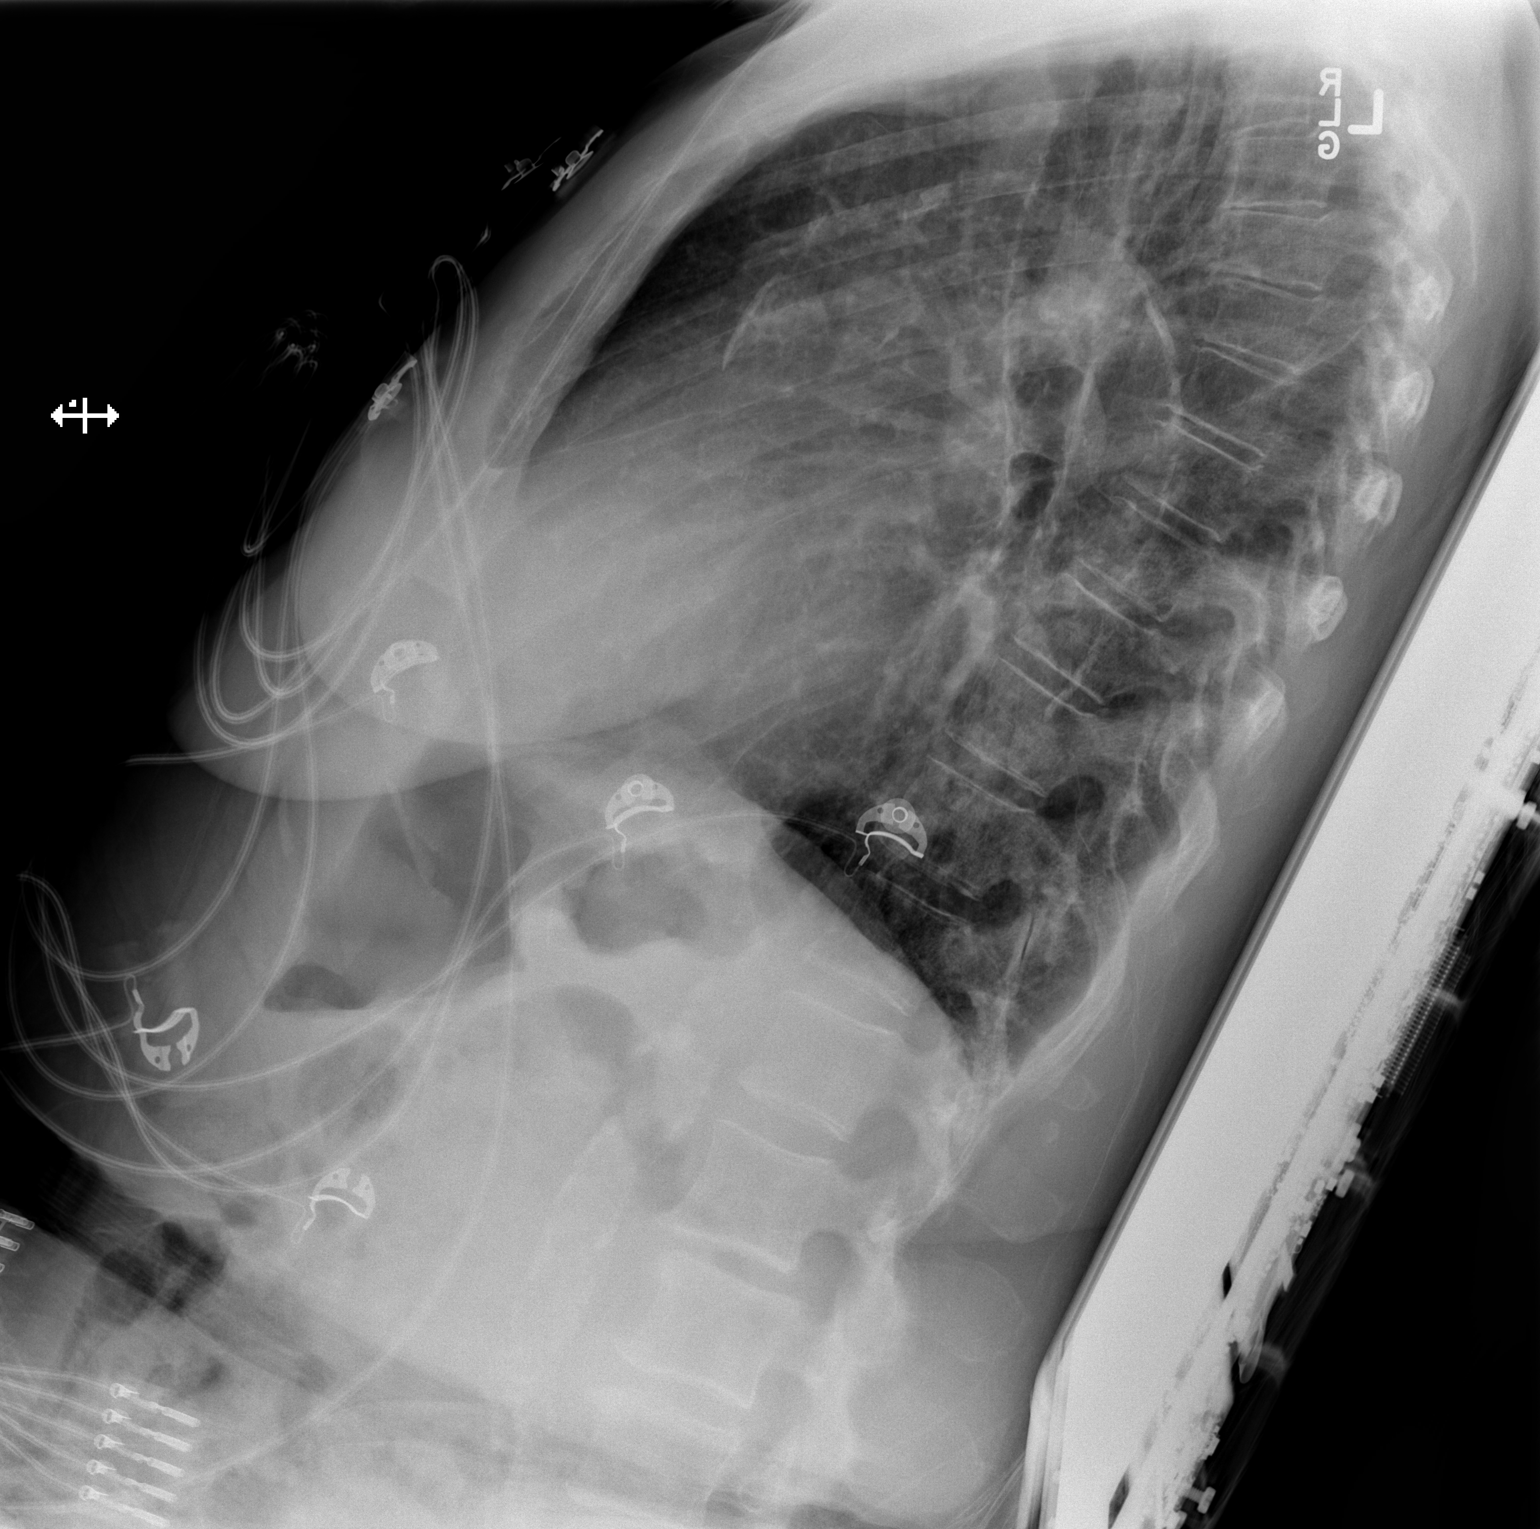

[2 of 2 positions shown; findings below may reference images not displayed]

FINDINGS: AP semi-erect and lateral images were obtained. Cardiac CT mildly
enlarged for AP technique, unchanged. Thoracic aorta
atherosclerotic, unchanged. Right hilar lymphadenopathy and the
multifocal masses throughout the right lung identified on the
examinations 1 month ago have shown significant interval
improvement. No new pulmonary parenchymal abnormalities in either
lung. No pleural effusions. Visualized bony thorax intact.
IMPRESSION: Improving right hilar lymphadenopathy and the multifocal masses
throughout the right lung. No new/acute cardiopulmonary disease.
Stable mild cardiomegaly without pulmonary edema.

## 2018-07-27 ENCOUNTER — Telehealth: Payer: Self-pay | Admitting: Medical Oncology

## 2018-07-27 NOTE — Telephone Encounter (Signed)
LVM to call back about labs I received from [facility].
